# Patient Record
Sex: Female | Born: 1950
Health system: Southern US, Community
[De-identification: ages and names within clinical notes are randomized; demographics above are authoritative.]

## PROBLEM LIST (undated history)

## (undated) DIAGNOSIS — E785 Hyperlipidemia, unspecified: Secondary | ICD-10-CM

## (undated) DIAGNOSIS — J45909 Unspecified asthma, uncomplicated: Secondary | ICD-10-CM

## (undated) DIAGNOSIS — Z8371 Family history of colonic polyps: Secondary | ICD-10-CM

## (undated) DIAGNOSIS — E782 Mixed hyperlipidemia: Secondary | ICD-10-CM

## (undated) DIAGNOSIS — J441 Chronic obstructive pulmonary disease with (acute) exacerbation: Secondary | ICD-10-CM

## (undated) DIAGNOSIS — D649 Anemia, unspecified: Secondary | ICD-10-CM

## (undated) DIAGNOSIS — J449 Chronic obstructive pulmonary disease, unspecified: Secondary | ICD-10-CM

## (undated) DIAGNOSIS — M1990 Unspecified osteoarthritis, unspecified site: Secondary | ICD-10-CM

## (undated) DIAGNOSIS — E119 Type 2 diabetes mellitus without complications: Secondary | ICD-10-CM

## (undated) DIAGNOSIS — K219 Gastro-esophageal reflux disease without esophagitis: Secondary | ICD-10-CM

## (undated) DIAGNOSIS — Z860100 Personal history of colon polyps, unspecified: Secondary | ICD-10-CM

## (undated) DIAGNOSIS — I2510 Atherosclerotic heart disease of native coronary artery without angina pectoris: Secondary | ICD-10-CM

## (undated) DIAGNOSIS — N951 Menopausal and female climacteric states: Secondary | ICD-10-CM

## (undated) DIAGNOSIS — I119 Hypertensive heart disease without heart failure: Secondary | ICD-10-CM

## (undated) DIAGNOSIS — F419 Anxiety disorder, unspecified: Secondary | ICD-10-CM

## (undated) DIAGNOSIS — M797 Fibromyalgia: Secondary | ICD-10-CM

## (undated) DIAGNOSIS — C4490 Unspecified malignant neoplasm of skin, unspecified: Secondary | ICD-10-CM

## (undated) DIAGNOSIS — M1991 Primary osteoarthritis, unspecified site: Secondary | ICD-10-CM

## (undated) DIAGNOSIS — F32A Depression, unspecified: Secondary | ICD-10-CM

## (undated) DIAGNOSIS — E039 Hypothyroidism, unspecified: Secondary | ICD-10-CM

## (undated) DIAGNOSIS — F320 Major depressive disorder, single episode, mild: Secondary | ICD-10-CM

## (undated) DIAGNOSIS — J45901 Unspecified asthma with (acute) exacerbation: Secondary | ICD-10-CM

## (undated) DIAGNOSIS — G4730 Sleep apnea, unspecified: Secondary | ICD-10-CM

## (undated) DIAGNOSIS — F329 Major depressive disorder, single episode, unspecified: Secondary | ICD-10-CM

## (undated) DIAGNOSIS — M069 Rheumatoid arthritis, unspecified: Secondary | ICD-10-CM

## (undated) DIAGNOSIS — I499 Cardiac arrhythmia, unspecified: Secondary | ICD-10-CM

## (undated) DIAGNOSIS — R200 Anesthesia of skin: Secondary | ICD-10-CM

## (undated) DIAGNOSIS — E1121 Type 2 diabetes mellitus with diabetic nephropathy: Secondary | ICD-10-CM

## (undated) DIAGNOSIS — J320 Chronic maxillary sinusitis: Secondary | ICD-10-CM

## (undated) DIAGNOSIS — Z83719 Family history of colon polyps, unspecified: Secondary | ICD-10-CM

## (undated) DIAGNOSIS — E663 Overweight: Secondary | ICD-10-CM

## (undated) DIAGNOSIS — E559 Vitamin D deficiency, unspecified: Secondary | ICD-10-CM

## (undated) DIAGNOSIS — R519 Headache, unspecified: Secondary | ICD-10-CM

## (undated) DIAGNOSIS — R202 Paresthesia of skin: Secondary | ICD-10-CM

## (undated) DIAGNOSIS — F17219 Nicotine dependence, cigarettes, with unspecified nicotine-induced disorders: Secondary | ICD-10-CM

## (undated) HISTORY — DX: Rheumatoid arthritis, unspecified: M06.9

## (undated) HISTORY — DX: Sleep apnea, unspecified: G47.30

## (undated) HISTORY — DX: Type 2 diabetes mellitus without complications: E11.9

## (undated) HISTORY — DX: Fibromyalgia: M79.7

## (undated) HISTORY — DX: Anesthesia of skin: R20.0

## (undated) HISTORY — DX: Unspecified osteoarthritis, unspecified site: M19.90

## (undated) HISTORY — DX: Personal history of colonic polyps: Z86.010

## (undated) HISTORY — DX: Overweight: E66.3

## (undated) HISTORY — DX: Vitamin D deficiency, unspecified: E55.9

## (undated) HISTORY — DX: Unspecified malignant neoplasm of skin, unspecified: C44.90

## (undated) HISTORY — PX: TONSILLECTOMY: SHX5217

## (undated) HISTORY — DX: Anxiety disorder, unspecified: F41.9

## (undated) HISTORY — DX: Hypothyroidism, unspecified: E03.9

## (undated) HISTORY — DX: Chronic maxillary sinusitis: J32.0

## (undated) HISTORY — DX: Primary osteoarthritis, unspecified site: M19.91

## (undated) HISTORY — DX: Headache, unspecified: R51.9

## (undated) HISTORY — DX: Hypertensive heart disease without heart failure: I11.9

## (undated) HISTORY — DX: Anesthesia of skin: R20.2

## (undated) HISTORY — DX: Unspecified asthma, uncomplicated: J45.909

## (undated) HISTORY — DX: Family history of colonic polyps: Z83.71

## (undated) HISTORY — DX: Chronic obstructive pulmonary disease, unspecified: J44.9

## (undated) HISTORY — DX: Family history of colon polyps, unspecified: Z83.719

## (undated) HISTORY — PX: HEEL SPUR SURGERY: SHX665

## (undated) HISTORY — DX: Unspecified asthma with (acute) exacerbation: J45.901

## (undated) HISTORY — DX: Atherosclerotic heart disease of native coronary artery without angina pectoris: I25.10

## (undated) HISTORY — DX: Menopausal and female climacteric states: N95.1

## (undated) HISTORY — DX: Gastro-esophageal reflux disease without esophagitis: K21.9

## (undated) HISTORY — DX: Depression, unspecified: F32.A

## (undated) HISTORY — DX: Type 2 diabetes mellitus with diabetic nephropathy: E11.21

## (undated) HISTORY — DX: Nicotine dependence, cigarettes, with unspecified nicotine-induced disorders: F17.219

## (undated) HISTORY — DX: Mixed hyperlipidemia: E78.2

## (undated) HISTORY — DX: Major depressive disorder, single episode, unspecified: F32.9

## (undated) HISTORY — DX: Major depressive disorder, single episode, mild: F32.0

## (undated) HISTORY — DX: Chronic obstructive pulmonary disease with (acute) exacerbation: J44.1

## (undated) HISTORY — DX: Hyperlipidemia, unspecified: E78.5

## (undated) HISTORY — DX: Personal history of colon polyps, unspecified: Z86.0100

---

## 1974-06-05 HISTORY — PX: BREAST LUMPECTOMY: SHX2

## 1978-06-05 HISTORY — PX: CYST EXCISION: SHX5701

## 1982-06-05 HISTORY — PX: ABDOMINAL HYSTERECTOMY: SHX81

## 1996-06-05 HISTORY — PX: CARPAL TUNNEL RELEASE: SHX101

## 1998-01-05 ENCOUNTER — Ambulatory Visit (HOSPITAL_BASED_OUTPATIENT_CLINIC_OR_DEPARTMENT_OTHER): Admission: RE | Admit: 1998-01-05 | Discharge: 1998-01-05 | Payer: Self-pay | Admitting: Podiatry

## 1998-09-22 ENCOUNTER — Ambulatory Visit (HOSPITAL_COMMUNITY): Admission: RE | Admit: 1998-09-22 | Discharge: 1998-09-22 | Payer: Self-pay | Admitting: Orthopedic Surgery

## 1998-09-22 ENCOUNTER — Encounter: Payer: Self-pay | Admitting: Orthopedic Surgery

## 1998-10-19 ENCOUNTER — Encounter: Payer: Self-pay | Admitting: Orthopedic Surgery

## 1998-10-19 ENCOUNTER — Ambulatory Visit (HOSPITAL_COMMUNITY): Admission: RE | Admit: 1998-10-19 | Discharge: 1998-10-19 | Payer: Self-pay | Admitting: Orthopedic Surgery

## 2010-09-05 HISTORY — PX: CHOLECYSTECTOMY: SHX55

## 2013-02-07 HISTORY — PX: COLONOSCOPY: SHX174

## 2014-01-12 ENCOUNTER — Encounter: Payer: Self-pay | Admitting: Critical Care Medicine

## 2014-01-13 ENCOUNTER — Encounter: Payer: Self-pay | Admitting: Critical Care Medicine

## 2014-01-13 ENCOUNTER — Ambulatory Visit (INDEPENDENT_AMBULATORY_CARE_PROVIDER_SITE_OTHER): Payer: Self-pay | Admitting: Critical Care Medicine

## 2014-01-13 VITALS — BP 128/76 | HR 90 | Temp 96.6°F | Ht 65.0 in | Wt 219.0 lb

## 2014-01-13 DIAGNOSIS — J45901 Unspecified asthma with (acute) exacerbation: Secondary | ICD-10-CM | POA: Insufficient documentation

## 2014-01-13 DIAGNOSIS — F329 Major depressive disorder, single episode, unspecified: Secondary | ICD-10-CM | POA: Insufficient documentation

## 2014-01-13 DIAGNOSIS — E039 Hypothyroidism, unspecified: Secondary | ICD-10-CM | POA: Insufficient documentation

## 2014-01-13 DIAGNOSIS — G4730 Sleep apnea, unspecified: Secondary | ICD-10-CM

## 2014-01-13 DIAGNOSIS — M797 Fibromyalgia: Secondary | ICD-10-CM | POA: Insufficient documentation

## 2014-01-13 DIAGNOSIS — J441 Chronic obstructive pulmonary disease with (acute) exacerbation: Secondary | ICD-10-CM | POA: Insufficient documentation

## 2014-01-13 DIAGNOSIS — C4490 Unspecified malignant neoplasm of skin, unspecified: Secondary | ICD-10-CM | POA: Insufficient documentation

## 2014-01-13 DIAGNOSIS — J4540 Moderate persistent asthma, uncomplicated: Secondary | ICD-10-CM

## 2014-01-13 DIAGNOSIS — E785 Hyperlipidemia, unspecified: Secondary | ICD-10-CM | POA: Insufficient documentation

## 2014-01-13 DIAGNOSIS — G4733 Obstructive sleep apnea (adult) (pediatric): Secondary | ICD-10-CM

## 2014-01-13 DIAGNOSIS — M1990 Unspecified osteoarthritis, unspecified site: Secondary | ICD-10-CM | POA: Insufficient documentation

## 2014-01-13 DIAGNOSIS — J45909 Unspecified asthma, uncomplicated: Secondary | ICD-10-CM

## 2014-01-13 DIAGNOSIS — E119 Type 2 diabetes mellitus without complications: Secondary | ICD-10-CM

## 2014-01-13 DIAGNOSIS — M069 Rheumatoid arthritis, unspecified: Secondary | ICD-10-CM | POA: Insufficient documentation

## 2014-01-13 DIAGNOSIS — E782 Mixed hyperlipidemia: Secondary | ICD-10-CM | POA: Insufficient documentation

## 2014-01-13 DIAGNOSIS — E669 Obesity, unspecified: Secondary | ICD-10-CM

## 2014-01-13 DIAGNOSIS — G4710 Hypersomnia, unspecified: Secondary | ICD-10-CM

## 2014-01-13 DIAGNOSIS — F32A Depression, unspecified: Secondary | ICD-10-CM | POA: Insufficient documentation

## 2014-01-13 DIAGNOSIS — K219 Gastro-esophageal reflux disease without esophagitis: Secondary | ICD-10-CM

## 2014-01-13 DIAGNOSIS — J418 Mixed simple and mucopurulent chronic bronchitis: Secondary | ICD-10-CM

## 2014-01-13 DIAGNOSIS — R1314 Dysphagia, pharyngoesophageal phase: Secondary | ICD-10-CM

## 2014-01-13 DIAGNOSIS — M1991 Primary osteoarthritis, unspecified site: Secondary | ICD-10-CM | POA: Insufficient documentation

## 2014-01-13 HISTORY — DX: Obstructive sleep apnea (adult) (pediatric): G47.33

## 2014-01-13 MED ORDER — FLUTICASONE PROPIONATE 50 MCG/ACT NA SUSP: 2.0000 | Freq: Two times a day (BID) | NASAL | Status: DC

## 2014-01-13 MED ORDER — PREDNISONE 10 MG PO TABS: ORAL_TABLET | ORAL | Status: DC

## 2014-01-13 MED ORDER — OMEPRAZOLE 20 MG PO CPDR: 20.0000 mg | DELAYED_RELEASE_CAPSULE | Freq: Every day | ORAL | Status: DC

## 2014-01-13 MED ORDER — PREDNISONE 5 MG PO TABS: ORAL_TABLET | ORAL | Status: DC

## 2014-01-13 NOTE — Assessment & Plan Note (Signed)
Obstructive asthma with acute exacerbation Fairly mild airway obstruction on pulmonary function testing Reflux disease as a precipitating factor along with chronic sinus disease Plan Chest xray today Esophageal swallow study will be obtained Labs : allergy profile, Alpha one antitrypsin Increase prednisone to 10mg  Take 4 for two days three for two days two for two days one for two days Stay on Dulera, use new spacer Start omeprazole one daily before breakfast, 61minutes Follow reflux diet Increase flonase two puff ea nostril twice daily

## 2014-01-13 NOTE — Assessment & Plan Note (Signed)
previous study in 2012 showing moderate sleep-disordered breathing with an AHI of 3.8 no significant restless legs seen followup sleep titration study showed all apneas were treated with moderate pressure of 11 cm water pressure Plan Repeat sleep study

## 2014-01-13 NOTE — Patient Instructions (Addendum)
Chest xray today Esophageal swallow study will be obtained Labs : allergy profile, Alpha one antitrypsin Increase prednisone to 10mg  Take 4 for two days three for two days two for two days one for two days Stay on Dulera, use new spacer Start omeprazole one daily before breakfast, 81minutes Follow reflux diet Increase flonase two puff ea nostril twice daily A home sleep study will be obtained Return 1 month

## 2014-01-13 NOTE — Progress Notes (Signed)
Subjective:    Patient ID: Brenda Cochran, female    DOB: Oct 07, 1950, 63 y.o.   MRN: [id number]  HPI Comments: Hx of tired all the time and dyspneic.  Dx asthma rx dulera. [place] stay in 2011 at [place]. Former smoker for years.  Dx copd and asthma. Rx oxygen and BDs  Now worse. Pt saw [facility] one time 2012.  Dx OSA, sleep study: never Rx with cpap.  Pt stays tired all the time. Recent sinus issue and stopped up for two years  Shortness of Breath This is a chronic problem. The current episode started more than 1 year ago. The problem occurs constantly (exertional and all the time ). The problem has been gradually worsening. Associated symptoms include chest pain, ear pain, headaches, orthopnea, PND, rhinorrhea, a sore throat and wheezing. Pertinent negatives include no abdominal pain, claudication, coryza, fever, hemoptysis, leg pain, leg swelling, sputum production, swollen glands, syncope or vomiting. 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 Associated symptoms comments: Notes heartburn Cough is dry. Risk factors include smoking. She has tried beta agonist inhalers and steroid inhalers for the symptoms. The treatment provided no relief. Her past medical history is significant for asthma, COPD, DVT, a heart failure and pneumonia. There is no history of allergies, CAD, chronic lung disease, PE or a recent surgery. (Hx of diastolic HF; off warfarin for a while, hx of DVT)    PUL ASTHMA HISTORY 01/13/2014  Symptoms Throughout the day  Nighttime awakenings Often--7/wk  Interference with activity Extreme limitations  SABA use Several times/day  Exacerbations requiring oral steroids 2 or more / year   Past Medical History  Diagnosis Date  . Asthma   . COPD (chronic obstructive pulmonary disease)   . Osteoarthritis   . Hyperlipidemia   . Fibromyalgia    . RA (rheumatoid arthritis)     [name]  . Type 2 diabetes mellitus   . Skin cancer     face  . Hypothyroidism (acquired)   . GERD (gastroesophageal reflux disease)   . Depression      Family History  Problem Relation Age of Onset  . Breast cancer Sister   . Lymphoma Sister   . Multiple sclerosis Brother   . Muscular dystrophy Brother   . CAD    . Diabetes    . Hypertension    . Seizures    . Lung disease Mother     never smoker  . Lung disease Maternal Uncle   . Lung disease Maternal Uncle   . Lung cancer Father   . Bone cancer Father      History   Social History  . Marital Status: Married    Spouse Name: N/A    Number of Children: N/A  . Years of Education: N/A   Occupational History  . disabled     jockey   Social History Main Topics  . Smoking status: Former Smoker -- 3.00 packs/day for 30 years    Types: Cigarettes    Quit date: 06/04/2010  . Smokeless tobacco: Not on file  . Alcohol Use: No  . Drug Use: No  . Sexual Activity: Not on file   Other Topics Concern  . Not on file   Social History Narrative   Lives with Toshiye Kever husband.   Disabled     Allergies  Allergen Reactions  . Contrast Media [Iodinated  Diagnostic Agents]   . Cymbalta [Duloxetine Hcl]     Nausea, vomiting  . Metformin And Related   . Oxycontin [Oxycodone Hcl]     Heart races  . Shellfish Allergy   . Ultram [Tramadol]      No outpatient prescriptions prior to visit.   No facility-administered medications prior to visit.      Review of Systems  Constitutional: Positive for diaphoresis, fatigue and unexpected weight change. Negative for fever.  HENT: Positive for ear discharge, ear pain, postnasal drip, rhinorrhea, sore throat, trouble swallowing and voice change.   Respiratory: Positive for apnea, cough, choking, chest tightness, shortness of breath and wheezing. Negative for hemoptysis and sputum production.   Cardiovascular: Positive for chest pain,  orthopnea and PND. Negative for claudication, leg swelling and syncope.  Gastrointestinal: Negative for vomiting and abdominal pain.       Heartburn daily  Neurological: Positive for weakness and headaches. Negative for syncope.       Objective:   Physical Exam  Filed Vitals:   01/13/14 1402  BP: 128/76  Pulse: 90  Temp: 96.6 F (35.9 C)  TempSrc: Oral  Height: 5\' 5"  (1.651 m)  Weight: 219 lb (99.338 kg)  SpO2: 97%    Gen: Pleasant, obese in no distress,  depressed affect  ENT: No lesions,  mouth clear,  oropharynx clear, no postnasal drip  Neck: No JVD, no TMG, no carotid bruits  Lungs: No use of accessory muscles, no dullness to percussion, expired wheezes and distant breath sounds  Cardiovascular: RRR, heart sounds normal, no murmur or gallops, no peripheral edema  Abdomen: soft and NT, no HSM,  BS normal  Musculoskeletal: No deformities, no cyanosis or clubbing  Neuro: alert, non focal  Skin: Warm, no lesions or rashes  No results found. Spirometry shows mild obstructive defect with FEV1 76% predicted      Assessment & Plan:   OSA (obstructive sleep apnea) previous study in 2012 showing moderate sleep-disordered breathing with an AHI of 3.8 no significant restless legs seen followup sleep titration study showed all apneas were treated with moderate pressure of 11 cm water pressure Plan Repeat sleep study   Asthma, chronic obstructive, with acute exacerbation Obstructive asthma with acute exacerbation Fairly mild airway obstruction on pulmonary function testing Reflux disease as a precipitating factor along with chronic sinus disease Plan Chest xray today Esophageal swallow study will be obtained Labs : allergy profile, Alpha one antitrypsin Increase prednisone to 10mg  Take 4 for two days three for two days two for two days one for two days Stay on Dulera, use new spacer Start omeprazole one daily before breakfast, 50minutes Follow reflux  diet Increase flonase two puff ea nostril twice daily   Obesity (BMI 30-39.9) Moderate obesity with reflux disease Plan Proton pump inhibitor Weight loss with reflux diet   Updated Medication List Outpatient Encounter Prescriptions as of 01/13/2014  Medication Sig  . atorvastatin (LIPITOR) 40 MG tablet Take 40 mg by mouth daily.  . Canagliflozin (INVOKANA) 100 MG TABS Take 100 mg by mouth. Take 1 tablet by mouth daily  . cholecalciferol (VITAMIN D) 1000 UNITS tablet Take 1,000 Units by mouth. Take 2 times a  week  . fluticasone (FLONASE) 50 MCG/ACT nasal spray Place 2 sprays into both nostrils 2 (two) times daily.  . furosemide (LASIX) 20 MG tablet Take 20 mg by mouth. Daily as needed 1 tablet  . HYDROcodone-acetaminophen (NORCO/VICODIN) 5-325 MG per tablet Take 1 tablet by mouth every 6 (six)  hours as needed for moderate pain.  . Liraglutide (VICTOZA ) Inject into the skin. 0.6 mg SQ every day  . methotrexate (RHEUMATREX) 2.5 MG tablet Take 2.5 mg by mouth. 2.5 mg tablets by mouth take 8 one time a week  . mometasone-formoterol (DULERA) 200-5 MCG/ACT AERO Inhale 2 puffs into the lungs 2 (two) times daily.  . predniSONE (DELTASONE) 5 MG tablet Hold while on 10mg  prednisone  . sertraline (ZOLOFT) 100 MG tablet Take 100 mg by mouth. 2 tablets 1 time a day  . thyroid (ARMOUR) 60 MG tablet Take 60 mg by mouth daily before breakfast.  . [DISCONTINUED] fluticasone (FLONASE) 50 MCG/ACT nasal spray Place 2 sprays into both nostrils daily.  . [DISCONTINUED] mometasone-formoterol (DULERA) 100-5 MCG/ACT AERO Inhale 2 puffs into the lungs 2 (two) times daily.  . [DISCONTINUED] predniSONE (DELTASONE) 5 MG tablet Take 5 mg by mouth. As directed as needed  . omeprazole (PRILOSEC) 20 MG capsule Take 1 capsule (20 mg total) by mouth daily.  . predniSONE (DELTASONE) 10 MG tablet Take 4 for two days three for two days two for two days one for two days

## 2014-01-13 NOTE — Assessment & Plan Note (Signed)
Moderate obesity with reflux disease Plan Proton pump inhibitor Weight loss with reflux diet

## 2014-01-14 ENCOUNTER — Telehealth: Payer: Self-pay | Admitting: Critical Care Medicine

## 2014-01-14 NOTE — Telephone Encounter (Signed)
Called and spoke to pt. Informed pt that the results have not yet been reviewed but once we have the results we will inform her. Pt stated she had sharp pains in her chest and "rib cage area" last night. I informed the pt to seek emergency care if she begins to experience severe chest pain. Pt verbalized understanding and denied any further questions or concerns at this time.

## 2014-01-30 ENCOUNTER — Telehealth: Payer: Self-pay | Admitting: Critical Care Medicine

## 2014-01-30 DIAGNOSIS — K219 Gastro-esophageal reflux disease without esophagitis: Secondary | ICD-10-CM

## 2014-01-30 DIAGNOSIS — J45901 Unspecified asthma with (acute) exacerbation: Secondary | ICD-10-CM

## 2014-01-30 DIAGNOSIS — J441 Chronic obstructive pulmonary disease with (acute) exacerbation: Secondary | ICD-10-CM

## 2014-01-30 NOTE — Telephone Encounter (Signed)
Returning call.Brenda Cochran ° °

## 2014-01-30 NOTE — Telephone Encounter (Signed)
lmomtcb for pt 

## 2014-01-30 NOTE — Telephone Encounter (Signed)
Tell pt i reviewed cxr: normal, also esophagram normal No alpha one antitrypsin deficiency

## 2014-01-30 NOTE — Telephone Encounter (Signed)
Called, spoke with pt - Informed her of below results per Dr. Joya Gaskins.  She verbalized understanding and voiced no further questions or concerns at this time.

## 2014-01-30 NOTE — Telephone Encounter (Signed)
Pt states she is returning call & states it ok to LM if no answer//hdp

## 2014-02-10 ENCOUNTER — Telehealth: Payer: Self-pay | Admitting: Critical Care Medicine

## 2014-02-10 ENCOUNTER — Ambulatory Visit: Payer: Self-pay | Admitting: Critical Care Medicine

## 2014-02-10 ENCOUNTER — Encounter: Payer: Self-pay | Admitting: Critical Care Medicine

## 2014-02-10 NOTE — Telephone Encounter (Signed)
I do not see anything in EPIC. Called spoke with pt. She reports pt spouse answered the phone. Pt reports she cancelled today's appt last week when she was called. Crystal, did you call pt? thanks

## 2014-02-10 NOTE — Telephone Encounter (Signed)
Spoke with pt -  Pt needed to cancelled today's appt in the [place] office.  Offered to rescheduled appt.  Pt would like to call back to do this.  She voiced no further questions or concerns at this time.

## 2014-03-03 ENCOUNTER — Telehealth: Payer: Self-pay | Admitting: Critical Care Medicine

## 2014-03-03 DIAGNOSIS — G4733 Obstructive sleep apnea (adult) (pediatric): Secondary | ICD-10-CM

## 2014-03-03 NOTE — Telephone Encounter (Signed)
Called, spoke with pt - Informed her of below results and recs per Dr. Joya Gaskins.  She verbalized understanding and voiced no further questions or concerns at this time.

## 2014-03-03 NOTE — Telephone Encounter (Signed)
Call pt and tell her sleep study only showed very mild sleep apnea. No need for oxygen or cpap.  She needs to just focus on weight loss. Consider trying to sleep on side.

## 2014-06-08 DIAGNOSIS — R81 Glycosuria: Secondary | ICD-10-CM | POA: Diagnosis not present

## 2014-06-08 DIAGNOSIS — J9801 Acute bronchospasm: Secondary | ICD-10-CM | POA: Diagnosis not present

## 2014-06-08 DIAGNOSIS — J209 Acute bronchitis, unspecified: Secondary | ICD-10-CM | POA: Diagnosis not present

## 2014-06-12 DIAGNOSIS — J209 Acute bronchitis, unspecified: Secondary | ICD-10-CM | POA: Diagnosis not present

## 2014-07-30 DIAGNOSIS — M5412 Radiculopathy, cervical region: Secondary | ICD-10-CM | POA: Diagnosis not present

## 2014-07-30 DIAGNOSIS — M5032 Other cervical disc degeneration, mid-cervical region: Secondary | ICD-10-CM | POA: Diagnosis not present

## 2014-07-30 DIAGNOSIS — R0602 Shortness of breath: Secondary | ICD-10-CM | POA: Diagnosis not present

## 2014-07-30 DIAGNOSIS — I509 Heart failure, unspecified: Secondary | ICD-10-CM | POA: Diagnosis not present

## 2014-07-30 DIAGNOSIS — R0789 Other chest pain: Secondary | ICD-10-CM | POA: Diagnosis not present

## 2014-07-30 DIAGNOSIS — Z1231 Encounter for screening mammogram for malignant neoplasm of breast: Secondary | ICD-10-CM | POA: Diagnosis not present

## 2014-07-30 DIAGNOSIS — Z8673 Personal history of transient ischemic attack (TIA), and cerebral infarction without residual deficits: Secondary | ICD-10-CM | POA: Diagnosis not present

## 2014-07-30 DIAGNOSIS — M47812 Spondylosis without myelopathy or radiculopathy, cervical region: Secondary | ICD-10-CM | POA: Diagnosis not present

## 2014-07-30 LAB — HM MAMMOGRAPHY: HM Mammogram: NORMAL (ref 0–4)

## 2014-08-07 DIAGNOSIS — J4520 Mild intermittent asthma, uncomplicated: Secondary | ICD-10-CM | POA: Diagnosis not present

## 2014-08-07 DIAGNOSIS — R5383 Other fatigue: Secondary | ICD-10-CM | POA: Diagnosis not present

## 2014-08-07 DIAGNOSIS — J301 Allergic rhinitis due to pollen: Secondary | ICD-10-CM | POA: Diagnosis not present

## 2014-08-07 DIAGNOSIS — G4733 Obstructive sleep apnea (adult) (pediatric): Secondary | ICD-10-CM | POA: Diagnosis not present

## 2014-08-21 DIAGNOSIS — G4733 Obstructive sleep apnea (adult) (pediatric): Secondary | ICD-10-CM | POA: Diagnosis not present

## 2014-09-07 DIAGNOSIS — J301 Allergic rhinitis due to pollen: Secondary | ICD-10-CM | POA: Diagnosis not present

## 2014-09-07 DIAGNOSIS — G4733 Obstructive sleep apnea (adult) (pediatric): Secondary | ICD-10-CM | POA: Diagnosis not present

## 2014-09-07 DIAGNOSIS — J4520 Mild intermittent asthma, uncomplicated: Secondary | ICD-10-CM | POA: Diagnosis not present

## 2014-09-07 DIAGNOSIS — R5383 Other fatigue: Secondary | ICD-10-CM | POA: Diagnosis not present

## 2014-09-09 DIAGNOSIS — E780 Pure hypercholesterolemia: Secondary | ICD-10-CM | POA: Diagnosis not present

## 2014-09-09 DIAGNOSIS — E018 Other iodine-deficiency related thyroid disorders and allied conditions: Secondary | ICD-10-CM | POA: Diagnosis not present

## 2014-09-09 DIAGNOSIS — M069 Rheumatoid arthritis, unspecified: Secondary | ICD-10-CM | POA: Diagnosis not present

## 2014-09-09 DIAGNOSIS — E038 Other specified hypothyroidism: Secondary | ICD-10-CM | POA: Diagnosis not present

## 2014-09-09 DIAGNOSIS — E1165 Type 2 diabetes mellitus with hyperglycemia: Secondary | ICD-10-CM | POA: Diagnosis not present

## 2014-09-09 DIAGNOSIS — F341 Dysthymic disorder: Secondary | ICD-10-CM | POA: Diagnosis not present

## 2014-09-09 DIAGNOSIS — J45998 Other asthma: Secondary | ICD-10-CM | POA: Diagnosis not present

## 2014-09-09 DIAGNOSIS — E559 Vitamin D deficiency, unspecified: Secondary | ICD-10-CM | POA: Diagnosis not present

## 2014-09-11 DIAGNOSIS — G4733 Obstructive sleep apnea (adult) (pediatric): Secondary | ICD-10-CM | POA: Diagnosis not present

## 2014-09-25 DIAGNOSIS — J4520 Mild intermittent asthma, uncomplicated: Secondary | ICD-10-CM | POA: Diagnosis not present

## 2014-09-25 DIAGNOSIS — F411 Generalized anxiety disorder: Secondary | ICD-10-CM | POA: Diagnosis not present

## 2014-09-25 DIAGNOSIS — J301 Allergic rhinitis due to pollen: Secondary | ICD-10-CM | POA: Diagnosis not present

## 2014-09-25 DIAGNOSIS — R5383 Other fatigue: Secondary | ICD-10-CM | POA: Diagnosis not present

## 2014-09-29 ENCOUNTER — Telehealth: Payer: Self-pay | Admitting: Critical Care Medicine

## 2014-09-29 NOTE — Telephone Encounter (Signed)
Left message for Brenda Cochran that request for sleep study has been faxed to number provided. If she does not receive the fax to please contact our office asap. Nothing more needed at this time.

## 2014-10-02 DIAGNOSIS — G4733 Obstructive sleep apnea (adult) (pediatric): Secondary | ICD-10-CM | POA: Diagnosis not present

## 2014-10-22 DIAGNOSIS — H524 Presbyopia: Secondary | ICD-10-CM | POA: Diagnosis not present

## 2014-10-22 DIAGNOSIS — I10 Essential (primary) hypertension: Secondary | ICD-10-CM | POA: Diagnosis not present

## 2014-10-22 DIAGNOSIS — H5203 Hypermetropia, bilateral: Secondary | ICD-10-CM | POA: Diagnosis not present

## 2014-10-22 DIAGNOSIS — E119 Type 2 diabetes mellitus without complications: Secondary | ICD-10-CM | POA: Diagnosis not present

## 2014-10-22 DIAGNOSIS — H52223 Regular astigmatism, bilateral: Secondary | ICD-10-CM | POA: Diagnosis not present

## 2014-11-02 DIAGNOSIS — M545 Low back pain: Secondary | ICD-10-CM | POA: Diagnosis not present

## 2014-11-09 DIAGNOSIS — M545 Low back pain: Secondary | ICD-10-CM | POA: Diagnosis not present

## 2014-11-09 DIAGNOSIS — M5430 Sciatica, unspecified side: Secondary | ICD-10-CM | POA: Diagnosis not present

## 2014-11-09 DIAGNOSIS — L738 Other specified follicular disorders: Secondary | ICD-10-CM | POA: Diagnosis not present

## 2014-11-09 DIAGNOSIS — E1165 Type 2 diabetes mellitus with hyperglycemia: Secondary | ICD-10-CM | POA: Diagnosis not present

## 2014-11-19 DIAGNOSIS — M545 Low back pain: Secondary | ICD-10-CM | POA: Diagnosis not present

## 2014-11-19 DIAGNOSIS — R262 Difficulty in walking, not elsewhere classified: Secondary | ICD-10-CM | POA: Diagnosis not present

## 2014-11-19 DIAGNOSIS — M5432 Sciatica, left side: Secondary | ICD-10-CM | POA: Diagnosis not present

## 2014-11-19 DIAGNOSIS — R202 Paresthesia of skin: Secondary | ICD-10-CM | POA: Diagnosis not present

## 2014-11-25 DIAGNOSIS — R5383 Other fatigue: Secondary | ICD-10-CM | POA: Diagnosis not present

## 2014-11-25 DIAGNOSIS — G4733 Obstructive sleep apnea (adult) (pediatric): Secondary | ICD-10-CM | POA: Diagnosis not present

## 2014-11-25 DIAGNOSIS — N3941 Urge incontinence: Secondary | ICD-10-CM | POA: Diagnosis not present

## 2014-11-25 DIAGNOSIS — J4520 Mild intermittent asthma, uncomplicated: Secondary | ICD-10-CM | POA: Diagnosis not present

## 2014-11-25 DIAGNOSIS — J301 Allergic rhinitis due to pollen: Secondary | ICD-10-CM | POA: Diagnosis not present

## 2014-11-25 DIAGNOSIS — F321 Major depressive disorder, single episode, moderate: Secondary | ICD-10-CM | POA: Diagnosis not present

## 2014-11-25 DIAGNOSIS — Z1211 Encounter for screening for malignant neoplasm of colon: Secondary | ICD-10-CM | POA: Diagnosis not present

## 2014-11-25 DIAGNOSIS — Z0000 Encounter for general adult medical examination without abnormal findings: Secondary | ICD-10-CM | POA: Diagnosis not present

## 2014-11-26 DIAGNOSIS — M545 Low back pain: Secondary | ICD-10-CM | POA: Diagnosis not present

## 2014-11-26 DIAGNOSIS — M5432 Sciatica, left side: Secondary | ICD-10-CM | POA: Diagnosis not present

## 2014-11-26 DIAGNOSIS — R202 Paresthesia of skin: Secondary | ICD-10-CM | POA: Diagnosis not present

## 2014-11-26 DIAGNOSIS — R262 Difficulty in walking, not elsewhere classified: Secondary | ICD-10-CM | POA: Diagnosis not present

## 2014-12-30 DIAGNOSIS — M0600 Rheumatoid arthritis without rheumatoid factor, unspecified site: Secondary | ICD-10-CM | POA: Diagnosis not present

## 2014-12-30 DIAGNOSIS — M791 Myalgia: Secondary | ICD-10-CM | POA: Diagnosis not present

## 2014-12-31 DIAGNOSIS — M2550 Pain in unspecified joint: Secondary | ICD-10-CM | POA: Diagnosis not present

## 2014-12-31 DIAGNOSIS — M069 Rheumatoid arthritis, unspecified: Secondary | ICD-10-CM | POA: Diagnosis not present

## 2014-12-31 DIAGNOSIS — R351 Nocturia: Secondary | ICD-10-CM | POA: Diagnosis not present

## 2014-12-31 DIAGNOSIS — L918 Other hypertrophic disorders of the skin: Secondary | ICD-10-CM | POA: Diagnosis not present

## 2014-12-31 DIAGNOSIS — M791 Myalgia: Secondary | ICD-10-CM | POA: Diagnosis not present

## 2014-12-31 DIAGNOSIS — E1165 Type 2 diabetes mellitus with hyperglycemia: Secondary | ICD-10-CM | POA: Diagnosis not present

## 2014-12-31 DIAGNOSIS — M150 Primary generalized (osteo)arthritis: Secondary | ICD-10-CM | POA: Diagnosis not present

## 2014-12-31 DIAGNOSIS — E780 Pure hypercholesterolemia: Secondary | ICD-10-CM | POA: Diagnosis not present

## 2014-12-31 DIAGNOSIS — G4733 Obstructive sleep apnea (adult) (pediatric): Secondary | ICD-10-CM | POA: Diagnosis not present

## 2014-12-31 DIAGNOSIS — M797 Fibromyalgia: Secondary | ICD-10-CM | POA: Diagnosis not present

## 2015-01-02 DIAGNOSIS — Z7952 Long term (current) use of systemic steroids: Secondary | ICD-10-CM | POA: Diagnosis not present

## 2015-01-02 DIAGNOSIS — Z87891 Personal history of nicotine dependence: Secondary | ICD-10-CM | POA: Diagnosis not present

## 2015-01-02 DIAGNOSIS — R531 Weakness: Secondary | ICD-10-CM | POA: Diagnosis not present

## 2015-01-02 DIAGNOSIS — R112 Nausea with vomiting, unspecified: Secondary | ICD-10-CM | POA: Diagnosis not present

## 2015-01-02 DIAGNOSIS — R51 Headache: Secondary | ICD-10-CM | POA: Diagnosis not present

## 2015-01-02 DIAGNOSIS — J449 Chronic obstructive pulmonary disease, unspecified: Secondary | ICD-10-CM | POA: Diagnosis not present

## 2015-01-02 DIAGNOSIS — R1110 Vomiting, unspecified: Secondary | ICD-10-CM | POA: Diagnosis not present

## 2015-01-02 DIAGNOSIS — E1165 Type 2 diabetes mellitus with hyperglycemia: Secondary | ICD-10-CM | POA: Diagnosis not present

## 2015-01-02 DIAGNOSIS — J45909 Unspecified asthma, uncomplicated: Secondary | ICD-10-CM | POA: Diagnosis not present

## 2015-01-02 DIAGNOSIS — Z79899 Other long term (current) drug therapy: Secondary | ICD-10-CM | POA: Diagnosis not present

## 2015-01-02 DIAGNOSIS — R42 Dizziness and giddiness: Secondary | ICD-10-CM | POA: Diagnosis not present

## 2015-01-11 DIAGNOSIS — L918 Other hypertrophic disorders of the skin: Secondary | ICD-10-CM | POA: Diagnosis not present

## 2015-04-09 DIAGNOSIS — Z23 Encounter for immunization: Secondary | ICD-10-CM | POA: Diagnosis not present

## 2015-06-08 DIAGNOSIS — J018 Other acute sinusitis: Secondary | ICD-10-CM | POA: Diagnosis not present

## 2015-06-08 DIAGNOSIS — E1165 Type 2 diabetes mellitus with hyperglycemia: Secondary | ICD-10-CM | POA: Diagnosis not present

## 2015-10-08 DIAGNOSIS — M7989 Other specified soft tissue disorders: Secondary | ICD-10-CM | POA: Diagnosis not present

## 2015-10-08 DIAGNOSIS — S93601A Unspecified sprain of right foot, initial encounter: Secondary | ICD-10-CM | POA: Diagnosis not present

## 2015-10-08 DIAGNOSIS — S93609A Unspecified sprain of unspecified foot, initial encounter: Secondary | ICD-10-CM | POA: Diagnosis not present

## 2015-10-08 DIAGNOSIS — M7731 Calcaneal spur, right foot: Secondary | ICD-10-CM | POA: Diagnosis not present

## 2015-11-26 DIAGNOSIS — E782 Mixed hyperlipidemia: Secondary | ICD-10-CM | POA: Diagnosis not present

## 2015-11-26 DIAGNOSIS — G5762 Lesion of plantar nerve, left lower limb: Secondary | ICD-10-CM | POA: Diagnosis not present

## 2015-11-26 DIAGNOSIS — G5761 Lesion of plantar nerve, right lower limb: Secondary | ICD-10-CM | POA: Diagnosis not present

## 2015-11-26 DIAGNOSIS — M25562 Pain in left knee: Secondary | ICD-10-CM | POA: Diagnosis not present

## 2015-11-26 DIAGNOSIS — M25561 Pain in right knee: Secondary | ICD-10-CM | POA: Diagnosis not present

## 2015-11-26 DIAGNOSIS — E119 Type 2 diabetes mellitus without complications: Secondary | ICD-10-CM | POA: Diagnosis not present

## 2015-11-26 DIAGNOSIS — G4733 Obstructive sleep apnea (adult) (pediatric): Secondary | ICD-10-CM | POA: Diagnosis not present

## 2015-11-26 DIAGNOSIS — E038 Other specified hypothyroidism: Secondary | ICD-10-CM | POA: Diagnosis not present

## 2015-11-26 DIAGNOSIS — E1121 Type 2 diabetes mellitus with diabetic nephropathy: Secondary | ICD-10-CM | POA: Diagnosis not present

## 2015-11-26 DIAGNOSIS — M069 Rheumatoid arthritis, unspecified: Secondary | ICD-10-CM | POA: Diagnosis not present

## 2015-12-10 DIAGNOSIS — Z7982 Long term (current) use of aspirin: Secondary | ICD-10-CM | POA: Diagnosis not present

## 2015-12-10 DIAGNOSIS — E1165 Type 2 diabetes mellitus with hyperglycemia: Secondary | ICD-10-CM | POA: Diagnosis not present

## 2015-12-10 DIAGNOSIS — I5032 Chronic diastolic (congestive) heart failure: Secondary | ICD-10-CM | POA: Diagnosis not present

## 2015-12-10 DIAGNOSIS — I249 Acute ischemic heart disease, unspecified: Secondary | ICD-10-CM | POA: Diagnosis not present

## 2015-12-10 DIAGNOSIS — I110 Hypertensive heart disease with heart failure: Secondary | ICD-10-CM | POA: Diagnosis not present

## 2015-12-10 DIAGNOSIS — E785 Hyperlipidemia, unspecified: Secondary | ICD-10-CM | POA: Diagnosis not present

## 2015-12-10 DIAGNOSIS — Z8673 Personal history of transient ischemic attack (TIA), and cerebral infarction without residual deficits: Secondary | ICD-10-CM | POA: Diagnosis not present

## 2015-12-10 DIAGNOSIS — Z79899 Other long term (current) drug therapy: Secondary | ICD-10-CM | POA: Diagnosis not present

## 2015-12-10 DIAGNOSIS — I209 Angina pectoris, unspecified: Secondary | ICD-10-CM | POA: Diagnosis not present

## 2015-12-10 DIAGNOSIS — R079 Chest pain, unspecified: Secondary | ICD-10-CM | POA: Diagnosis not present

## 2015-12-11 DIAGNOSIS — R079 Chest pain, unspecified: Secondary | ICD-10-CM | POA: Diagnosis not present

## 2015-12-27 DIAGNOSIS — J4520 Mild intermittent asthma, uncomplicated: Secondary | ICD-10-CM | POA: Diagnosis not present

## 2015-12-27 DIAGNOSIS — M25561 Pain in right knee: Secondary | ICD-10-CM | POA: Diagnosis not present

## 2015-12-27 DIAGNOSIS — M25521 Pain in right elbow: Secondary | ICD-10-CM | POA: Diagnosis not present

## 2015-12-27 DIAGNOSIS — E1165 Type 2 diabetes mellitus with hyperglycemia: Secondary | ICD-10-CM | POA: Diagnosis not present

## 2015-12-27 DIAGNOSIS — I208 Other forms of angina pectoris: Secondary | ICD-10-CM | POA: Diagnosis not present

## 2015-12-27 DIAGNOSIS — F331 Major depressive disorder, recurrent, moderate: Secondary | ICD-10-CM | POA: Diagnosis not present

## 2015-12-27 DIAGNOSIS — M0589 Other rheumatoid arthritis with rheumatoid factor of multiple sites: Secondary | ICD-10-CM | POA: Diagnosis not present

## 2016-01-13 DIAGNOSIS — M7989 Other specified soft tissue disorders: Secondary | ICD-10-CM | POA: Diagnosis not present

## 2016-01-13 DIAGNOSIS — M25561 Pain in right knee: Secondary | ICD-10-CM | POA: Diagnosis not present

## 2016-01-26 DIAGNOSIS — M25461 Effusion, right knee: Secondary | ICD-10-CM | POA: Diagnosis not present

## 2016-01-26 DIAGNOSIS — M25561 Pain in right knee: Secondary | ICD-10-CM | POA: Diagnosis not present

## 2016-01-26 DIAGNOSIS — M791 Myalgia: Secondary | ICD-10-CM | POA: Diagnosis not present

## 2016-02-23 DIAGNOSIS — E6609 Other obesity due to excess calories: Secondary | ICD-10-CM | POA: Diagnosis not present

## 2016-02-23 DIAGNOSIS — M8589 Other specified disorders of bone density and structure, multiple sites: Secondary | ICD-10-CM | POA: Diagnosis not present

## 2016-02-23 DIAGNOSIS — Z0001 Encounter for general adult medical examination with abnormal findings: Secondary | ICD-10-CM | POA: Diagnosis not present

## 2016-02-23 DIAGNOSIS — Z6833 Body mass index (BMI) 33.0-33.9, adult: Secondary | ICD-10-CM | POA: Diagnosis not present

## 2016-02-23 DIAGNOSIS — B3789 Other sites of candidiasis: Secondary | ICD-10-CM | POA: Diagnosis not present

## 2016-02-23 DIAGNOSIS — Z23 Encounter for immunization: Secondary | ICD-10-CM | POA: Diagnosis not present

## 2016-02-23 DIAGNOSIS — N644 Mastodynia: Secondary | ICD-10-CM | POA: Diagnosis not present

## 2016-02-23 DIAGNOSIS — Z1239 Encounter for other screening for malignant neoplasm of breast: Secondary | ICD-10-CM | POA: Diagnosis not present

## 2016-02-23 DIAGNOSIS — Z7952 Long term (current) use of systemic steroids: Secondary | ICD-10-CM | POA: Diagnosis not present

## 2016-03-08 DIAGNOSIS — M7989 Other specified soft tissue disorders: Secondary | ICD-10-CM | POA: Diagnosis not present

## 2016-03-08 DIAGNOSIS — M0579 Rheumatoid arthritis with rheumatoid factor of multiple sites without organ or systems involvement: Secondary | ICD-10-CM | POA: Diagnosis not present

## 2016-03-09 DIAGNOSIS — E782 Mixed hyperlipidemia: Secondary | ICD-10-CM | POA: Diagnosis not present

## 2016-03-09 DIAGNOSIS — G5761 Lesion of plantar nerve, right lower limb: Secondary | ICD-10-CM | POA: Diagnosis not present

## 2016-03-09 DIAGNOSIS — M0589 Other rheumatoid arthritis with rheumatoid factor of multiple sites: Secondary | ICD-10-CM | POA: Diagnosis not present

## 2016-03-09 DIAGNOSIS — E1121 Type 2 diabetes mellitus with diabetic nephropathy: Secondary | ICD-10-CM | POA: Diagnosis not present

## 2016-03-09 DIAGNOSIS — G4733 Obstructive sleep apnea (adult) (pediatric): Secondary | ICD-10-CM | POA: Diagnosis not present

## 2016-03-09 DIAGNOSIS — G5762 Lesion of plantar nerve, left lower limb: Secondary | ICD-10-CM | POA: Diagnosis not present

## 2016-05-12 DIAGNOSIS — F322 Major depressive disorder, single episode, severe without psychotic features: Secondary | ICD-10-CM | POA: Diagnosis not present

## 2016-05-12 DIAGNOSIS — R5081 Fever presenting with conditions classified elsewhere: Secondary | ICD-10-CM | POA: Diagnosis not present

## 2016-05-12 DIAGNOSIS — J102 Influenza due to other identified influenza virus with gastrointestinal manifestations: Secondary | ICD-10-CM | POA: Diagnosis not present

## 2016-05-17 DIAGNOSIS — F321 Major depressive disorder, single episode, moderate: Secondary | ICD-10-CM | POA: Diagnosis not present

## 2016-05-17 DIAGNOSIS — J180 Bronchopneumonia, unspecified organism: Secondary | ICD-10-CM | POA: Diagnosis not present

## 2016-05-17 DIAGNOSIS — E119 Type 2 diabetes mellitus without complications: Secondary | ICD-10-CM | POA: Diagnosis not present

## 2016-06-02 DIAGNOSIS — Z6832 Body mass index (BMI) 32.0-32.9, adult: Secondary | ICD-10-CM | POA: Diagnosis not present

## 2016-06-02 DIAGNOSIS — E669 Obesity, unspecified: Secondary | ICD-10-CM | POA: Diagnosis not present

## 2016-06-02 DIAGNOSIS — M0579 Rheumatoid arthritis with rheumatoid factor of multiple sites without organ or systems involvement: Secondary | ICD-10-CM | POA: Diagnosis not present

## 2016-06-02 DIAGNOSIS — M150 Primary generalized (osteo)arthritis: Secondary | ICD-10-CM | POA: Diagnosis not present

## 2016-06-14 DIAGNOSIS — J069 Acute upper respiratory infection, unspecified: Secondary | ICD-10-CM | POA: Diagnosis not present

## 2016-06-14 DIAGNOSIS — R05 Cough: Secondary | ICD-10-CM | POA: Diagnosis not present

## 2016-06-14 DIAGNOSIS — N3000 Acute cystitis without hematuria: Secondary | ICD-10-CM | POA: Diagnosis not present

## 2016-06-14 DIAGNOSIS — R5081 Fever presenting with conditions classified elsewhere: Secondary | ICD-10-CM | POA: Diagnosis not present

## 2016-06-14 DIAGNOSIS — R51 Headache: Secondary | ICD-10-CM | POA: Diagnosis not present

## 2016-06-14 DIAGNOSIS — R509 Fever, unspecified: Secondary | ICD-10-CM | POA: Diagnosis not present

## 2016-06-14 DIAGNOSIS — E860 Dehydration: Secondary | ICD-10-CM | POA: Diagnosis not present

## 2016-06-14 DIAGNOSIS — R194 Change in bowel habit: Secondary | ICD-10-CM | POA: Diagnosis not present

## 2016-06-14 DIAGNOSIS — M542 Cervicalgia: Secondary | ICD-10-CM | POA: Diagnosis not present

## 2016-06-14 DIAGNOSIS — S161XXA Strain of muscle, fascia and tendon at neck level, initial encounter: Secondary | ICD-10-CM | POA: Diagnosis not present

## 2016-07-11 DIAGNOSIS — J189 Pneumonia, unspecified organism: Secondary | ICD-10-CM | POA: Diagnosis not present

## 2016-07-27 DIAGNOSIS — J069 Acute upper respiratory infection, unspecified: Secondary | ICD-10-CM | POA: Diagnosis not present

## 2016-08-09 DIAGNOSIS — M25562 Pain in left knee: Secondary | ICD-10-CM | POA: Diagnosis not present

## 2016-08-09 DIAGNOSIS — M25561 Pain in right knee: Secondary | ICD-10-CM | POA: Diagnosis not present

## 2016-08-09 DIAGNOSIS — M150 Primary generalized (osteo)arthritis: Secondary | ICD-10-CM | POA: Diagnosis not present

## 2016-08-09 DIAGNOSIS — Z6831 Body mass index (BMI) 31.0-31.9, adult: Secondary | ICD-10-CM | POA: Diagnosis not present

## 2016-08-09 DIAGNOSIS — M0579 Rheumatoid arthritis with rheumatoid factor of multiple sites without organ or systems involvement: Secondary | ICD-10-CM | POA: Diagnosis not present

## 2016-08-09 DIAGNOSIS — E669 Obesity, unspecified: Secondary | ICD-10-CM | POA: Diagnosis not present

## 2016-08-18 DIAGNOSIS — G4733 Obstructive sleep apnea (adult) (pediatric): Secondary | ICD-10-CM | POA: Diagnosis not present

## 2016-08-18 DIAGNOSIS — E782 Mixed hyperlipidemia: Secondary | ICD-10-CM | POA: Diagnosis not present

## 2016-08-18 DIAGNOSIS — M0589 Other rheumatoid arthritis with rheumatoid factor of multiple sites: Secondary | ICD-10-CM | POA: Diagnosis not present

## 2016-08-18 DIAGNOSIS — E1121 Type 2 diabetes mellitus with diabetic nephropathy: Secondary | ICD-10-CM | POA: Diagnosis not present

## 2016-08-22 DIAGNOSIS — E1121 Type 2 diabetes mellitus with diabetic nephropathy: Secondary | ICD-10-CM | POA: Diagnosis not present

## 2016-08-22 DIAGNOSIS — E782 Mixed hyperlipidemia: Secondary | ICD-10-CM | POA: Diagnosis not present

## 2016-09-14 ENCOUNTER — Emergency Department (HOSPITAL_COMMUNITY): Payer: Medicare Other

## 2016-09-14 ENCOUNTER — Observation Stay (HOSPITAL_COMMUNITY): Payer: Medicare Other

## 2016-09-14 ENCOUNTER — Observation Stay (HOSPITAL_COMMUNITY)
Admission: EM | Admit: 2016-09-14 | Discharge: 2016-09-15 | Disposition: A | Discharge status: Home / Self Care | Payer: Medicare Other | Attending: Nephrology | Admitting: Nephrology

## 2016-09-14 DIAGNOSIS — F329 Major depressive disorder, single episode, unspecified: Secondary | ICD-10-CM | POA: Insufficient documentation

## 2016-09-14 DIAGNOSIS — R05 Cough: Secondary | ICD-10-CM | POA: Diagnosis not present

## 2016-09-14 DIAGNOSIS — Z7982 Long term (current) use of aspirin: Secondary | ICD-10-CM | POA: Insufficient documentation

## 2016-09-14 DIAGNOSIS — R29810 Facial weakness: Secondary | ICD-10-CM | POA: Diagnosis not present

## 2016-09-14 DIAGNOSIS — M6281 Muscle weakness (generalized): Secondary | ICD-10-CM | POA: Insufficient documentation

## 2016-09-14 DIAGNOSIS — K219 Gastro-esophageal reflux disease without esophagitis: Secondary | ICD-10-CM | POA: Insufficient documentation

## 2016-09-14 DIAGNOSIS — Z91013 Allergy to seafood: Secondary | ICD-10-CM | POA: Diagnosis not present

## 2016-09-14 DIAGNOSIS — M797 Fibromyalgia: Secondary | ICD-10-CM | POA: Diagnosis not present

## 2016-09-14 DIAGNOSIS — R51 Headache: Secondary | ICD-10-CM | POA: Diagnosis not present

## 2016-09-14 DIAGNOSIS — E785 Hyperlipidemia, unspecified: Secondary | ICD-10-CM | POA: Diagnosis not present

## 2016-09-14 DIAGNOSIS — Z87891 Personal history of nicotine dependence: Secondary | ICD-10-CM | POA: Insufficient documentation

## 2016-09-14 DIAGNOSIS — R531 Weakness: Secondary | ICD-10-CM | POA: Insufficient documentation

## 2016-09-14 DIAGNOSIS — G4733 Obstructive sleep apnea (adult) (pediatric): Secondary | ICD-10-CM | POA: Diagnosis present

## 2016-09-14 DIAGNOSIS — Z7951 Long term (current) use of inhaled steroids: Secondary | ICD-10-CM | POA: Insufficient documentation

## 2016-09-14 DIAGNOSIS — R200 Anesthesia of skin: Secondary | ICD-10-CM | POA: Diagnosis not present

## 2016-09-14 DIAGNOSIS — H539 Unspecified visual disturbance: Secondary | ICD-10-CM

## 2016-09-14 DIAGNOSIS — E039 Hypothyroidism, unspecified: Secondary | ICD-10-CM | POA: Diagnosis present

## 2016-09-14 DIAGNOSIS — I10 Essential (primary) hypertension: Secondary | ICD-10-CM | POA: Insufficient documentation

## 2016-09-14 DIAGNOSIS — M069 Rheumatoid arthritis, unspecified: Secondary | ICD-10-CM | POA: Insufficient documentation

## 2016-09-14 DIAGNOSIS — E782 Mixed hyperlipidemia: Secondary | ICD-10-CM | POA: Diagnosis present

## 2016-09-14 DIAGNOSIS — G459 Transient cerebral ischemic attack, unspecified: Secondary | ICD-10-CM | POA: Diagnosis present

## 2016-09-14 DIAGNOSIS — J449 Chronic obstructive pulmonary disease, unspecified: Secondary | ICD-10-CM | POA: Diagnosis not present

## 2016-09-14 DIAGNOSIS — H538 Other visual disturbances: Secondary | ICD-10-CM | POA: Diagnosis not present

## 2016-09-14 DIAGNOSIS — G458 Other transient cerebral ischemic attacks and related syndromes: Secondary | ICD-10-CM | POA: Diagnosis not present

## 2016-09-14 DIAGNOSIS — Z79899 Other long term (current) drug therapy: Secondary | ICD-10-CM | POA: Diagnosis not present

## 2016-09-14 DIAGNOSIS — R202 Paresthesia of skin: Secondary | ICD-10-CM | POA: Diagnosis not present

## 2016-09-14 DIAGNOSIS — Z91041 Radiographic dye allergy status: Secondary | ICD-10-CM | POA: Insufficient documentation

## 2016-09-14 DIAGNOSIS — Z85828 Personal history of other malignant neoplasm of skin: Secondary | ICD-10-CM | POA: Insufficient documentation

## 2016-09-14 DIAGNOSIS — Z885 Allergy status to narcotic agent status: Secondary | ICD-10-CM | POA: Insufficient documentation

## 2016-09-14 DIAGNOSIS — R41841 Cognitive communication deficit: Secondary | ICD-10-CM | POA: Diagnosis not present

## 2016-09-14 DIAGNOSIS — M1990 Unspecified osteoarthritis, unspecified site: Secondary | ICD-10-CM | POA: Diagnosis not present

## 2016-09-14 DIAGNOSIS — R519 Headache, unspecified: Secondary | ICD-10-CM

## 2016-09-14 DIAGNOSIS — E119 Type 2 diabetes mellitus without complications: Secondary | ICD-10-CM | POA: Diagnosis not present

## 2016-09-14 HISTORY — DX: Transient cerebral ischemic attack, unspecified: G45.9

## 2016-09-14 LAB — APTT: aPTT: 26 seconds (ref 24–36)

## 2016-09-14 LAB — CBC
HCT: 42.6 % (ref 36.0–46.0)
Hemoglobin: 14.7 g/dL (ref 12.0–15.0)
MCH: 31.1 pg (ref 26.0–34.0)
MCHC: 34.5 g/dL (ref 30.0–36.0)
MCV: 90.3 fL (ref 78.0–100.0)
Platelets: 277 10*3/uL (ref 150–400)
RBC: 4.72 MIL/uL (ref 3.87–5.11)
RDW: 12.7 % (ref 11.5–15.5)
WBC: 7.8 10*3/uL (ref 4.0–10.5)

## 2016-09-14 LAB — BASIC METABOLIC PANEL
Anion gap: 8 (ref 5–15)
BUN: 17 mg/dL (ref 6–20)
CO2: 26 mmol/L (ref 22–32)
Calcium: 9.5 mg/dL (ref 8.9–10.3)
Chloride: 107 mmol/L (ref 101–111)
Creatinine, Ser: 0.76 mg/dL (ref 0.44–1.00)
GFR calc Af Amer: >60 mL/min (ref >60)
GFR calc non Af Amer: >60 mL/min (ref >60)
Glucose, Bld: 175 mg/dL — ABNORMAL HIGH (ref 65–99)
Potassium: 4.3 mmol/L (ref 3.5–5.1)
Sodium: 141 mmol/L (ref 135–145)

## 2016-09-14 LAB — URINALYSIS, ROUTINE W REFLEX MICROSCOPIC
Bacteria, UA: NONE SEEN
Bilirubin Urine: NEGATIVE
Glucose, UA: NEGATIVE mg/dL
Hgb urine dipstick: NEGATIVE
Ketones, ur: NEGATIVE mg/dL
Nitrite: NEGATIVE
Protein, ur: NEGATIVE mg/dL
Specific Gravity, Urine: 1.009 (ref 1.005–1.030)
Squamous Epithelial / LPF: NONE SEEN
pH: 6 (ref 5.0–8.0)

## 2016-09-14 LAB — RAPID URINE DRUG SCREEN, HOSP PERFORMED
Amphetamines: NOT DETECTED
Barbiturates: NOT DETECTED
Benzodiazepines: NOT DETECTED
Cocaine: NOT DETECTED
Opiates: NOT DETECTED
Tetrahydrocannabinol: NOT DETECTED

## 2016-09-14 LAB — HEPATIC FUNCTION PANEL
ALT: 27 U/L (ref 14–54)
AST: 20 U/L (ref 15–41)
Albumin: 3.6 g/dL (ref 3.5–5.0)
Alkaline Phosphatase: 112 U/L (ref 38–126)
Bilirubin, Direct: <0.1 mg/dL — ABNORMAL LOW (ref 0.1–0.5)
Total Bilirubin: 0.5 mg/dL (ref 0.3–1.2)
Total Protein: 7.5 g/dL (ref 6.5–8.1)

## 2016-09-14 LAB — DIFFERENTIAL
Basophils Absolute: 0 10*3/uL (ref 0.0–0.1)
Basophils Relative: 0 %
Eosinophils Absolute: 0.1 10*3/uL (ref 0.0–0.7)
Eosinophils Relative: 1 %
Lymphocytes Relative: 22 %
Lymphs Abs: 1.7 10*3/uL (ref 0.7–4.0)
Monocytes Absolute: 0.2 10*3/uL (ref 0.1–1.0)
Monocytes Relative: 2 %
Neutro Abs: 5.7 10*3/uL (ref 1.7–7.7)
Neutrophils Relative %: 75 %

## 2016-09-14 LAB — I-STAT TROPONIN, ED: Troponin i, poc: 0.01 ng/mL (ref 0.00–0.08)

## 2016-09-14 LAB — PROTIME-INR
INR: 0.98
Prothrombin Time: 12.9 seconds (ref 11.4–15.2)

## 2016-09-14 LAB — CREATININE, SERUM
Creatinine, Ser: 0.62 mg/dL (ref 0.44–1.00)
GFR calc Af Amer: >60 mL/min (ref >60)
GFR calc non Af Amer: >60 mL/min (ref >60)

## 2016-09-14 LAB — ETHANOL: Alcohol, Ethyl (B): <5 mg/dL (ref <5)

## 2016-09-14 MED ORDER — METHOTREXATE 2.5 MG PO TABS
15.0000 mg | ORAL_TABLET | ORAL | Status: DC
  Administered 2016-09-15: 15 mg via ORAL
  Filled 2016-09-14: qty 6

## 2016-09-14 MED ORDER — VENLAFAXINE HCL ER 75 MG PO CP24
75.0000 mg | ORAL_CAPSULE | Freq: Every day | ORAL | Status: DC
  Administered 2016-09-14: 75 mg via ORAL
  Filled 2016-09-14: qty 1

## 2016-09-14 MED ORDER — TIZANIDINE HCL 4 MG PO TABS: 4.0000 mg | ORAL_TABLET | Freq: Every evening | ORAL | Status: DC | PRN

## 2016-09-14 MED ORDER — ACETAMINOPHEN 650 MG RE SUPP: 650.0000 mg | RECTAL | Status: DC | PRN

## 2016-09-14 MED ORDER — SODIUM CHLORIDE 0.9 % IV SOLN
INTRAVENOUS | Status: DC
  Administered 2016-09-14: 22:00:00 via INTRAVENOUS

## 2016-09-14 MED ORDER — PROCHLORPERAZINE EDISYLATE 5 MG/ML IJ SOLN
10.0000 mg | Freq: Once | INTRAMUSCULAR | Status: AC
  Administered 2016-09-14: 10 mg via INTRAVENOUS
  Filled 2016-09-14: qty 2

## 2016-09-14 MED ORDER — SENNOSIDES-DOCUSATE SODIUM 8.6-50 MG PO TABS: 1.0000 | ORAL_TABLET | Freq: Every evening | ORAL | Status: DC | PRN

## 2016-09-14 MED ORDER — ALBUTEROL SULFATE (2.5 MG/3ML) 0.083% IN NEBU: 3.0000 mL | INHALATION_SOLUTION | RESPIRATORY_TRACT | Status: DC | PRN

## 2016-09-14 MED ORDER — DIPHENHYDRAMINE HCL 50 MG/ML IJ SOLN
25.0000 mg | Freq: Once | INTRAMUSCULAR | Status: AC
  Administered 2016-09-14: 25 mg via INTRAVENOUS
  Filled 2016-09-14: qty 1

## 2016-09-14 MED ORDER — MOMETASONE FURO-FORMOTEROL FUM 200-5 MCG/ACT IN AERO
2.0000 | INHALATION_SPRAY | Freq: Two times a day (BID) | RESPIRATORY_TRACT | Status: DC
  Administered 2016-09-15: 2 via RESPIRATORY_TRACT
  Filled 2016-09-14: qty 8.8

## 2016-09-14 MED ORDER — BISMUTH SUBSALICYLATE 262 MG/15ML PO SUSP: 30.0000 mL | Freq: Four times a day (QID) | ORAL | Status: DC | PRN

## 2016-09-14 MED ORDER — FOLIC ACID 1 MG PO TABS
1.0000 mg | ORAL_TABLET | ORAL | Status: DC
  Administered 2016-09-15: 1 mg via ORAL
  Filled 2016-09-14: qty 1

## 2016-09-14 MED ORDER — STROKE: EARLY STAGES OF RECOVERY BOOK
Freq: Once | Status: AC
  Administered 2016-09-14: 22:00:00
  Filled 2016-09-14: qty 1

## 2016-09-14 MED ORDER — ACETAMINOPHEN 160 MG/5ML PO SOLN
650.0000 mg | ORAL | Status: DC | PRN
Start: 2016-09-14 — End: 2016-09-15

## 2016-09-14 MED ORDER — ACETAMINOPHEN 325 MG PO TABS
650.0000 mg | ORAL_TABLET | Freq: Once | ORAL | Status: AC
  Administered 2016-09-14: 650 mg via ORAL
  Filled 2016-09-14: qty 2

## 2016-09-14 MED ORDER — ACETAMINOPHEN 325 MG PO TABS: 650.0000 mg | ORAL_TABLET | ORAL | Status: DC | PRN

## 2016-09-14 MED ORDER — PRAVASTATIN SODIUM 20 MG PO TABS
20.0000 mg | ORAL_TABLET | Freq: Every day | ORAL | Status: DC
  Administered 2016-09-14: 20 mg via ORAL
  Filled 2016-09-14: qty 1

## 2016-09-14 MED ORDER — PREDNISONE 5 MG PO TABS
5.0000 mg | ORAL_TABLET | Freq: Every day | ORAL | Status: DC
  Administered 2016-09-15: 5 mg via ORAL
  Filled 2016-09-14: qty 1

## 2016-09-14 MED ORDER — LORAZEPAM 2 MG/ML IJ SOLN: 0.5000 mg | Freq: Once | INTRAMUSCULAR | Status: DC | PRN

## 2016-09-14 MED ORDER — ENOXAPARIN SODIUM 40 MG/0.4ML ~~LOC~~ SOLN
40.0000 mg | SUBCUTANEOUS | Status: DC
  Administered 2016-09-14: 40 mg via SUBCUTANEOUS
  Filled 2016-09-14: qty 0.4

## 2016-09-14 MED ORDER — SODIUM CHLORIDE 0.9 % IV BOLUS (SEPSIS)
1000.0000 mL | Freq: Once | INTRAVENOUS | Status: AC
  Administered 2016-09-14: 1000 mL via INTRAVENOUS

## 2016-09-14 NOTE — ED Provider Notes (Signed)
[facility] DEPT Provider Note   CSN: [id number] Arrival date & time: 09/14/16  1011     History   Chief Complaint Chief Complaint  Patient presents with  . Nausea  . Headache  . vision changes    HPI Brenda Cochran is a 66 y.o. female with a past medical history significant for asthma, COPD, diabetes, and GERD who presents with multiple complaints including a 2 day history of right facial tingling, and onset of left face numbness, left arm pain, left arm numbness, right eye vision blurriness, and disorientation. Patient says that one week ago, she had headache on the right side of her face. She said she had some right facial tingling at that time. She said that 2 days ago, her right facial tingling returned and has been persistent. She reports that she has had intermittent headaches on that side as well. She says that this morning, she was driving her grandchildren to school when she had onset of pain and numbness in her left arm, numbness in her left face, and blurry vision. She was able to realize it was in her right eye in the lateral vision for blurriness. She has never had this before. She denied diplopia. She denied chest pain, shortness of breath but does report palpitations. She got disoriented and confused and was not sure where she was driving.  She denies any fevers, chills, cough, congestion. She reports some diarrhea several days ago that has resolved. She denies constipation or any urinary symptoms. She denies any history of similar symptoms. She reports previous lightheadedness that has resolved.  The history is provided by the patient and medical records. No language interpreter was used.  Neurologic Problem  This is a new problem. The current episode started 2 days ago. The problem occurs constantly. The problem has not changed since onset.Associated symptoms include headaches. Pertinent negatives include no chest pain, no abdominal pain and no shortness of breath. Nothing  aggravates the symptoms. Nothing relieves the symptoms. She has tried nothing for the symptoms. The treatment provided no relief.    Past Medical History:  Diagnosis Date  . Asthma   . COPD (chronic obstructive pulmonary disease)   . Depression   . Fibromyalgia   . GERD (gastroesophageal reflux disease)   . Hyperlipidemia   . Hypothyroidism (acquired)   . Osteoarthritis   . RA (rheumatoid arthritis)    [name]  . Skin cancer    face  . Type 2 diabetes mellitus     Patient Active Problem List   Diagnosis Date Noted  . Obesity (BMI 30-39.9) 01/13/2014  . OSA (obstructive sleep apnea) 01/13/2014  . Asthma, chronic obstructive, with acute exacerbation ([place])   . Osteoarthritis   . Hyperlipidemia   . Fibromyalgia   . RA (rheumatoid arthritis) ([place])   . Type 2 diabetes mellitus ([place])   . Skin cancer   . Hypothyroidism (acquired)   . GERD (gastroesophageal reflux disease)   . Depression     Past Surgical History:  Procedure Laterality Date  . ABDOMINAL HYSTERECTOMY  1984  . BREAST LUMPECTOMY  1976   left, benign  . CHOLECYSTECTOMY  09/05/10    OB History    No data available       Home Medications    Prior to Admission medications   Medication Sig Start Date End Date Taking? Authorizing Provider  atorvastatin (LIPITOR) 40 MG tablet Take 40 mg by mouth daily.    Historical Provider, MD  Canagliflozin [facility]) 100  MG TABS Take 100 mg by mouth. Take 1 tablet by mouth daily    Historical Provider, MD  cholecalciferol (VITAMIN D) 1000 UNITS tablet Take 1,000 Units by mouth. Take 2 times a  week    Historical Provider, MD  fluticasone (FLONASE) 50 MCG/ACT nasal spray Place 2 sprays into both nostrils 2 (two) times daily. 01/13/14   Elsie Stain, MD  furosemide (LASIX) 20 MG tablet Take 20 mg by mouth. Daily as needed 1 tablet    Historical Provider, MD  HYDROcodone-acetaminophen (NORCO/VICODIN) 5-325 MG per tablet Take 1 tablet by mouth every 6 (six) hours as  needed for moderate pain.    Historical Provider, MD  Liraglutide (VICTOZA [place]) Inject into the skin. 0.6 mg SQ every day    Historical Provider, MD  methotrexate (RHEUMATREX) 2.5 MG tablet Take 2.5 mg by mouth. 2.5 mg tablets by mouth take 8 one time a week    Historical Provider, MD  mometasone-formoterol (DULERA) 200-5 MCG/ACT AERO Inhale 2 puffs into the lungs 2 (two) times daily.    Historical Provider, MD  omeprazole (PRILOSEC) 20 MG capsule Take 1 capsule (20 mg total) by mouth daily. 01/13/14   Elsie Stain, MD  predniSONE (DELTASONE) 10 MG tablet Take 4 for two days three for two days two for two days one for two days 01/13/14   Elsie Stain, MD  predniSONE (DELTASONE) 5 MG tablet Hold while on 10mg  prednisone 01/13/14   Elsie Stain, MD  sertraline (ZOLOFT) 100 MG tablet Take 100 mg by mouth. 2 tablets 1 time a day    Historical Provider, MD  thyroid (ARMOUR) 60 MG tablet Take 60 mg by mouth daily before breakfast.    Historical Provider, MD    Family History Family History  Problem Relation Age of Onset  . Breast cancer Sister   . Lymphoma Sister   . Multiple sclerosis Brother   . Muscular dystrophy Brother   . CAD    . Diabetes    . Hypertension    . Seizures    . Lung disease Mother     never smoker  . Lung disease Maternal Uncle   . Lung disease Maternal Uncle   . Lung cancer Father   . Bone cancer Father     Social History Social History  Substance Use Topics  . Smoking status: Former Smoker    Packs/day: 3.00    Years: 30.00    Types: Cigarettes    Quit date: 06/04/2010  . Smokeless tobacco: Not on file  . Alcohol use No     Allergies   Contrast media [iodinated diagnostic agents]; Cymbalta [duloxetine hcl]; Metformin and related; Oxycontin [oxycodone hcl]; Shellfish allergy; and Ultram [tramadol]   Review of Systems Review of Systems  Constitutional: Negative for chills, diaphoresis, fatigue and fever.  HENT: Negative for congestion and  rhinorrhea.   Eyes: Positive for photophobia and visual disturbance.  Respiratory: Negative for chest tightness, shortness of breath, wheezing and stridor.   Cardiovascular: Positive for palpitations. Negative for chest pain and leg swelling.  Gastrointestinal: Negative for abdominal pain, diarrhea, nausea and vomiting.  Genitourinary: Negative for dysuria, flank pain and frequency.  Musculoskeletal: Negative for back pain, neck pain and neck stiffness.  Skin: Negative for rash and wound.  Neurological: Positive for light-headedness, numbness and headaches. Negative for dizziness, facial asymmetry, speech difficulty and weakness.  Psychiatric/Behavioral: Negative for agitation.  All other systems reviewed and are negative.    Physical Exam Updated  Vital Signs There were no vitals taken for this visit.  Physical Exam  Constitutional: She is oriented to person, place, and time. She appears well-developed and well-nourished. No distress.  HENT:  Head: Normocephalic and atraumatic.  Right Ear: External ear normal.  Left Ear: External ear normal.  Nose: Nose normal.  Mouth/Throat: Oropharynx is clear and moist. No oropharyngeal exudate.  Eyes: Conjunctivae and EOM are normal. Pupils are equal, round, and reactive to light.  Neck: Normal range of motion. Neck supple.  Cardiovascular: Normal rate, normal heart sounds and intact distal pulses.   No murmur heard. Pulmonary/Chest: Effort normal. No stridor. No respiratory distress. She has no wheezes. She has no rales. She exhibits no tenderness.  Abdominal: She exhibits no distension. There is no tenderness. There is no rebound.  Musculoskeletal: She exhibits no tenderness.  Neurological: She is alert and oriented to person, place, and time. She has normal reflexes. She is not disoriented. She displays no tremor. A cranial nerve deficit and sensory deficit is present. She exhibits normal muscle tone. Coordination normal. GCS eye subscore is  4. GCS verbal subscore is 5. GCS motor subscore is 6.  Decreased vision in lateral visual fields of the right eye. Normal extraocular movement. Numbness in left face and left arm. Normal grip strength bilaterally. No lower extremity weakness or numbness or tingling. Tingling in the right face.  Skin: Skin is warm. Capillary refill takes less than 2 seconds. No rash noted. She is not diaphoretic. No erythema.  Psychiatric: She has a normal mood and affect.  Nursing note and vitals reviewed.    ED Treatments / Results  Labs (all labs ordered are listed, but only abnormal results are displayed) Labs Reviewed  BASIC METABOLIC PANEL - Abnormal; Notable for the following:       Result Value   Glucose, Bld 175 (*)    All other components within normal limits  URINALYSIS, ROUTINE W REFLEX MICROSCOPIC - Abnormal; Notable for the following:    Color, Urine STRAW (*)    Leukocytes, UA TRACE (*)    All other components within normal limits  HEPATIC FUNCTION PANEL - Abnormal; Notable for the following:    Bilirubin, Direct <0.1 (*)    All other components within normal limits  CBC  ETHANOL  PROTIME-INR  APTT  RAPID URINE DRUG SCREEN, HOSP PERFORMED  DIFFERENTIAL  CREATININE, SERUM  HIV ANTIBODY (ROUTINE TESTING)  HEMOGLOBIN A1C  LIPID PANEL  I-STAT TROPOININ, ED    EKG  EKG Interpretation  Date/Time:  Thursday September 14 2016 10:22:37 EDT Ventricular Rate:  70 PR Interval:    QRS Duration: 89 QT Interval:  406 QTC Calculation: 439 R Axis:   7 Text Interpretation:  Sinus rhythm Borderline short PR interval Abnormal R-wave progression, early transition Baseline wander in lead(s) V6 No prior ECG for comparison.  No STEMI Confirmed by Sherry Ruffing MD, [place] [phone]) on 09/14/2016 10:31:09 AM       Radiology Dg Chest 2 View  Result Date: 09/14/2016 CLINICAL DATA:  Coughing congestion. EXAM: CHEST  2 VIEW COMPARISON:  06/14/2016 FINDINGS: The lungs are clear wiithout focal pneumonia,  edema, pneumothorax or pleural effusion. The cardiopericardial silhouette is within normal limits for size. The visualized bony structures of the thorax are intact. Telemetry leads overlie the chest. IMPRESSION: No active cardiopulmonary disease. Electronically Signed   By: Misty Stanley M.D.   On: 09/14/2016 11:33   Ct Head Wo Contrast  Result Date: 09/14/2016 CLINICAL DATA:  Left facial and  upper extremity weakness. EXAM: CT HEAD WITHOUT CONTRAST TECHNIQUE: Contiguous axial images were obtained from the base of the skull through the vertex without intravenous contrast. COMPARISON:  CT scan of June 14, 2016. FINDINGS: Brain: No evidence of acute infarction, hemorrhage, hydrocephalus, extra-axial collection or mass lesion/mass effect. Vascular: Atherosclerosis of carotid siphons is noted. Skull: Normal. Negative for fracture or focal lesion. Sinuses/Orbits: Right sphenoid sinusitis is noted. Other: None. IMPRESSION: Mild right sphenoid sinusitis. No acute intracranial abnormality seen. Electronically Signed   By: Marijo Conception, M.D.   On: 09/14/2016 11:48   Mr Brain Wo Contrast  Result Date: 09/14/2016 CLINICAL DATA:  66 y/o F; right eye vision changes, left face and left arm numbness, right face tingling, and headaches. EXAM: MRI HEAD WITHOUT CONTRAST TECHNIQUE: Multiplanar, multiecho pulse sequences of the brain and surrounding structures were obtained without intravenous contrast. COMPARISON:  09/14/2016 CT head FINDINGS: Brain: No acute infarction, hemorrhage, hydrocephalus, extra-axial collection or mass lesion. Few nonspecific foci of T2 FLAIR hyperintensity within subcortical and periventricular white matter are compatible with mild chronic microvascular ischemic changes. Mild brain parenchymal volume loss. Vascular: Normal flow voids. Skull and upper cervical spine: Normal marrow signal. Sinuses/Orbits: Right-sided ethmoid and sphenoid sinus mucosal thickening. Trace right mastoid tip effusion.  Orbits are unremarkable. Other: None. IMPRESSION: 1. No acute intracranial abnormality identified. 2. Mild chronic microvascular ischemic changes and mild parenchymal volume loss of the brain. 3. Paranasal sinus disease and right ethmoid air cells and sphenoid sinus. Electronically Signed   By: Kristine Garbe M.D.   On: 09/14/2016 14:33    Procedures Procedures (including critical care time)  Medications Ordered in ED Medications  albuterol (PROVENTIL) (2.5 MG/3ML) 0.083% nebulizer solution 3 mL (not administered)  bismuth subsalicylate (PEPTO BISMOL) 262 MG/15ML suspension 30 mL (not administered)  mometasone-formoterol (DULERA) 200-5 MCG/ACT inhaler 2 puff (not administered)  folic acid (FOLVITE) tablet 1 mg (not administered)  methotrexate (RHEUMATREX) tablet 15 mg (not administered)  pravastatin (PRAVACHOL) tablet 20 mg (20 mg Oral Given 09/14/16 2156)  predniSONE (DELTASONE) tablet 5 mg (not administered)  tiZANidine (ZANAFLEX) tablet 4 mg (not administered)  venlafaxine XR (EFFEXOR-XR) 24 hr capsule 75 mg (75 mg Oral Given 09/14/16 2156)  0.9 %  sodium chloride infusion ( Intravenous New Bag/Given 09/14/16 2203)  acetaminophen (TYLENOL) tablet 650 mg (not administered)    Or  acetaminophen (TYLENOL) solution 650 mg (not administered)    Or  acetaminophen (TYLENOL) suppository 650 mg (not administered)  senna-docusate (Senokot-S) tablet 1 tablet (not administered)  enoxaparin (LOVENOX) injection 40 mg (40 mg Subcutaneous Given 09/14/16 2157)  acetaminophen (TYLENOL) tablet 650 mg (650 mg Oral Given 09/14/16 1210)  prochlorperazine (COMPAZINE) injection 10 mg (10 mg Intravenous Given 09/14/16 1441)  diphenhydrAMINE (BENADRYL) injection 25 mg (25 mg Intravenous Given 09/14/16 1441)  sodium chloride 0.9 % bolus 1,000 mL (1,000 mLs Intravenous New Bag/Given 09/14/16 1441)   stroke: mapping our early stages of recovery book ( Does not apply Given 09/14/16 2203)     Initial  Impression / Assessment and Plan / ED Course  I have reviewed the triage vital signs and the nursing notes.  Pertinent labs & imaging results that were available during my care of the patient were reviewed by me and considered in my medical decision making (see chart for details).     Brenda Cochran is a 66 y.o. female with a past medical history significant for asthma, COPD, diabetes, and GERD who presents with multiple complaints including a 2  day history of right facial tingling, and onset of left face numbness, left arm pain, left arm numbness, right eye vision blurriness, and disorientation.   History and exam are seen above. On exam, patient has numbness of the left face and left arm. Patient has no coordination abnormal. Patient has right eye lateral visual field deficits. Patient has normal extraocular movements. Patient subjectively reports right facial tingling although she says her sensation is better on the right than the left. Patient has no abnormalities in her large cavities. Patient's chest is nontender and lungs are clear. Abdomen is nontender. CVA area nontender.  Based on patient's symptoms, there is clinical concern for TIA versus stroke. Patient's last normal was over 2 days ago. Code stroke was not activated.   Patient will have workup initiated with CT head. Chart shows that patient has contrast allergy. Despite visual changes, we'll not order CTA of the head and neck at this time.  Anticipate speaking with neurology after initial workup is completed.  CT of the head showed no acute intracranial abnormality but did show evidence of chronic microvascular disease. Patient subsequently reported that she does remember having "blockages in her carotids of the neck".  Neurology was called and they recommended MRI and admission for further management.  MRI showed no evidence of acute stroke at this time. Hospitalist team was called for admission to rule out TIA and do other workup  of carotids and likely echo.  Patient continued to have headache and was given a headache cocktail. Patient reported improvement in headache significantly however, she continued to have a left face and left arm numbness. Patient admitted in stable condition for further management.     Final Clinical Impressions(s) / ED Diagnoses   Final diagnoses:  Acute nonintractable headache, unspecified headache type  Vision changes  Numbness and tingling of left side of face  Left arm numbness     Clinical Impression: 1. Acute nonintractable headache, unspecified headache type   2. Numbness and tingling in left upper extremity   3. Vision changes   4. Numbness and tingling of left side of face   5. Left arm numbness     Disposition: Admit to hospitalist service    Courtney Paris, MD 09/14/16 2218

## 2016-09-14 NOTE — ED Triage Notes (Signed)
Pt reports nausea, left arm pain and vision changes started 830 Am . Also reports lethargy . Arm pain radiating to neck . Family hx MI. Hx cardiac illness.

## 2016-09-14 NOTE — ED Notes (Signed)
ED Provider at bedside. 

## 2016-09-14 NOTE — H&P (Addendum)
History and Physical    Brenda Cochran ZCH:[id number] DOB: 05/17/1951 DOA: 09/14/2016  PCP: Rochel Brome, MD  Patient coming from: Home.   I have personally briefly reviewed patient's old medical records in [facility]  Chief Complaint: blurry vision right eye, left face and left arm numbness and tingling.   HPI: Brenda Cochran is a 66 y.o. female with medical history significant of Asthma, RA on methotrexate, DM, Hypothyroidism, carotid artery diseases who present complaining of left face, mouth left arm numbness and tingling that started 2 days prior to admission. She is also report right eye blurry vision since early this morning. She denies focal weakness, or slurred speech.  She had problems over the winter with viral infection, it took her long time to recover from viral illness.   ED Course: WBC 7, hb 14, cr 0.76, MRI no acute abnormalities, CT head sphenoid  Sinusitis , no acute abnormalities.   Review of Systems: As per HPI otherwise 10 point review of systems negative.    Past Medical History:  Diagnosis Date  . Asthma   . COPD (chronic obstructive pulmonary disease)   . Depression   . Fibromyalgia   . GERD (gastroesophageal reflux disease)   . Hyperlipidemia   . Hypothyroidism (acquired)   . Osteoarthritis   . RA (rheumatoid arthritis)    [name]  . Skin cancer    face  . Type 2 diabetes mellitus     Past Surgical History:  Procedure Laterality Date  . ABDOMINAL HYSTERECTOMY  1984  . BREAST LUMPECTOMY  1976   left, benign  . CHOLECYSTECTOMY  09/05/10     reports that she quit smoking about 6 years ago. Her smoking use included Cigarettes. She has a 90.00 pack-year smoking history. She does not have any smokeless tobacco history on file. She reports that she does not drink alcohol or use drugs.  Allergies  Allergen Reactions  . Shellfish Allergy Anaphylaxis  . Contrast Media [Iodinated Diagnostic Agents] Rash  . Metformin And Related Nausea And  Vomiting  . Cymbalta [Duloxetine Hcl] Nausea And Vomiting  . Ultram [Tramadol] Nausea And Vomiting  . Oxycontin [Oxycodone Hcl] Palpitations    Family History  Problem Relation Age of Onset  . Breast cancer Sister   . Lymphoma Sister   . Multiple sclerosis Brother   . Muscular dystrophy Brother   . CAD    . Diabetes    . Hypertension    . Seizures    . Lung disease Mother     never smoker  . Lung disease Maternal Uncle   . Lung disease Maternal Uncle   . Lung cancer Father   . Bone cancer Father     Prior to Admission medications   Medication Sig Start Date End Date Taking? Authorizing Provider  albuterol (PROVENTIL HFA;VENTOLIN HFA) 108 (90 Base) MCG/ACT inhaler Inhale 2 puffs into the lungs every 4 (four) hours as needed for wheezing or shortness of breath.   Yes Historical Provider, MD  budesonide-formoterol (SYMBICORT) 160-4.5 MCG/ACT inhaler Inhale 2 puffs into the lungs 2 (two) times daily.   Yes Historical Provider, MD  lisinopril (PRINIVIL,ZESTRIL) 5 MG tablet Take 5 mg by mouth daily.   Yes Historical Provider, MD  methotrexate (RHEUMATREX) 2.5 MG tablet Take 2.5 mg by mouth once a week. Caution:Chemotherapy. Protect from light.   Yes Historical Provider, MD  pravastatin (PRAVACHOL) 20 MG tablet Take 20 mg by mouth at bedtime.   Yes Historical Provider, MD  venlafaxine XR (EFFEXOR-XR) 75 MG 24 hr capsule Take 75 mg by mouth daily with breakfast.   Yes Historical Provider, MD  Canagliflozin (INVOKANA) 100 MG TABS Take 100 mg by mouth. Take 1 tablet by mouth daily    Historical Provider, MD  cholecalciferol (VITAMIN D) 1000 UNITS tablet Take 1,000 Units by mouth. Take 2 times a  week    Historical Provider, MD  fluticasone (FLONASE) 50 MCG/ACT nasal spray Place 2 sprays into both nostrils 2 (two) times daily. 01/13/14   Elsie Stain, MD  folic acid (FOLVITE) 1 MG tablet Take 1 mg by mouth every Friday.    Historical Provider, MD  furosemide (LASIX) 20 MG tablet Take 20  mg by mouth. Daily as needed 1 tablet    Historical Provider, MD  HYDROcodone-acetaminophen (NORCO/VICODIN) 5-325 MG per tablet Take 1 tablet by mouth every 6 (six) hours as needed for moderate pain.    Historical Provider, MD  Liraglutide (VICTOZA [place]) Inject into the skin. 0.6 mg SQ every day    Historical Provider, MD  mometasone-formoterol (DULERA) 200-5 MCG/ACT AERO Inhale 2 puffs into the lungs 2 (two) times daily.    Historical Provider, MD  omeprazole (PRILOSEC) 20 MG capsule Take 1 capsule (20 mg total) by mouth daily. 01/13/14   Elsie Stain, MD  predniSONE (DELTASONE) 5 MG tablet Hold while on 10mg  prednisone 01/13/14   Elsie Stain, MD  sertraline (ZOLOFT) 100 MG tablet Take 100 mg by mouth. 2 tablets 1 time a day    Historical Provider, MD  thyroid (ARMOUR) 60 MG tablet Take 60 mg by mouth daily before breakfast.    Historical Provider, MD    Physical Exam: Vitals:   09/14/16 1155 09/14/16 1300 09/14/16 1500 09/14/16 1600  BP:  (!) 151/82 (!) 152/77 (!) 150/82  Pulse:  68 76 71  Resp:  17 16 16   Temp: 98.3 F (36.8 C)     TempSrc:      SpO2:  97% 94% 98%    Constitutional: NAD, calm, comfortable Vitals:   09/14/16 1155 09/14/16 1300 09/14/16 1500 09/14/16 1600  BP:  (!) 151/82 (!) 152/77 (!) 150/82  Pulse:  68 76 71  Resp:  17 16 16   Temp: 98.3 F (36.8 C)     TempSrc:      SpO2:  97% 94% 98%   Eyes: PERRL, lids and conjunctivae normal ENMT: Mucous membranes are moist. Posterior pharynx clear of any exudate or lesions.Normal dentition.  Neck: normal, supple, no masses, no thyromegaly Respiratory: clear to auscultation bilaterally, no wheezing, no crackles. Normal respiratory effort. No accessory muscle use.  Cardiovascular: Regular rate and rhythm, no murmurs / rubs / gallops. No extremity edema. 2+ pedal pulses. No carotid bruits.  Abdomen: no tenderness, no masses palpated. No hepatosplenomegaly. Bowel sounds positive.  Musculoskeletal: no clubbing /  cyanosis. No joint deformity upper and lower extremities. Good ROM, no contractures. Normal muscle tone.  Skin: no rashes, lesions, ulcers. No induration Neurologic: CN 2-12 grossly intact. , DTR normal. Strength 5/5 in all 4.  Psychiatric: Normal judgment and insight. Alert and oriented x 3. Normal mood.     Labs on Admission: I have personally reviewed following labs and imaging studies  CBC:  Recent Labs Lab 09/14/16 1025  WBC 7.8  NEUTROABS 5.7  HGB 14.7  HCT 42.6  MCV 90.3  PLT 175   Basic Metabolic Panel:  Recent Labs Lab 09/14/16 1025  NA 141  K 4.3  CL  107  CO2 26  GLUCOSE 175*  BUN 17  CREATININE 0.76  CALCIUM 9.5   GFR: CrCl cannot be calculated (Unknown ideal weight.). Liver Function Tests:  Recent Labs Lab 09/14/16 1025  AST 20  ALT 27  ALKPHOS 112  BILITOT 0.5  PROT 7.5  ALBUMIN 3.6   No results for input(s): LIPASE, AMYLASE in the last 168 hours. No results for input(s): AMMONIA in the last 168 hours. Coagulation Profile:  Recent Labs Lab 09/14/16 1025  INR 0.98   Cardiac Enzymes: No results for input(s): CKTOTAL, CKMB, CKMBINDEX, TROPONINI in the last 168 hours. BNP (last 3 results) No results for input(s): PROBNP in the last 8760 hours. HbA1C: No results for input(s): HGBA1C in the last 72 hours. CBG: No results for input(s): GLUCAP in the last 168 hours. Lipid Profile: No results for input(s): CHOL, HDL, LDLCALC, TRIG, CHOLHDL, LDLDIRECT in the last 72 hours. Thyroid Function Tests: No results for input(s): TSH, T4TOTAL, FREET4, T3FREE, THYROIDAB in the last 72 hours. Anemia Panel: No results for input(s): VITAMINB12, FOLATE, FERRITIN, TIBC, IRON, RETICCTPCT in the last 72 hours. Urine analysis:    Component Value Date/Time   COLORURINE STRAW (A) 09/14/2016 1049   APPEARANCEUR CLEAR 09/14/2016 1049   LABSPEC 1.009 09/14/2016 1049   PHURINE 6.0 09/14/2016 1049   GLUCOSEU NEGATIVE 09/14/2016 1049   HGBUR NEGATIVE  09/14/2016 1049   BILIRUBINUR NEGATIVE 09/14/2016 1049   KETONESUR NEGATIVE 09/14/2016 1049   PROTEINUR NEGATIVE 09/14/2016 1049   NITRITE NEGATIVE 09/14/2016 1049   LEUKOCYTESUR TRACE (A) 09/14/2016 1049    Radiological Exams on Admission: Dg Chest 2 View  Result Date: 09/14/2016 CLINICAL DATA:  Coughing congestion. EXAM: CHEST  2 VIEW COMPARISON:  06/14/2016 FINDINGS: The lungs are clear wiithout focal pneumonia, edema, pneumothorax or pleural effusion. The cardiopericardial silhouette is within normal limits for size. The visualized bony structures of the thorax are intact. Telemetry leads overlie the chest. IMPRESSION: No active cardiopulmonary disease. Electronically Signed   By: Misty Stanley M.D.   On: 09/14/2016 11:33   Ct Head Wo Contrast  Result Date: 09/14/2016 CLINICAL DATA:  Left facial and upper extremity weakness. EXAM: CT HEAD WITHOUT CONTRAST TECHNIQUE: Contiguous axial images were obtained from the base of the skull through the vertex without intravenous contrast. COMPARISON:  CT scan of June 14, 2016. FINDINGS: Brain: No evidence of acute infarction, hemorrhage, hydrocephalus, extra-axial collection or mass lesion/mass effect. Vascular: Atherosclerosis of carotid siphons is noted. Skull: Normal. Negative for fracture or focal lesion. Sinuses/Orbits: Right sphenoid sinusitis is noted. Other: None. IMPRESSION: Mild right sphenoid sinusitis. No acute intracranial abnormality seen. Electronically Signed   By: Marijo Conception, M.D.   On: 09/14/2016 11:48   Mr Brain Wo Contrast  Result Date: 09/14/2016 CLINICAL DATA:  66 y/o F; right eye vision changes, left face and left arm numbness, right face tingling, and headaches. EXAM: MRI HEAD WITHOUT CONTRAST TECHNIQUE: Multiplanar, multiecho pulse sequences of the brain and surrounding structures were obtained without intravenous contrast. COMPARISON:  09/14/2016 CT head FINDINGS: Brain: No acute infarction, hemorrhage, hydrocephalus,  extra-axial collection or mass lesion. Few nonspecific foci of T2 FLAIR hyperintensity within subcortical and periventricular white matter are compatible with mild chronic microvascular ischemic changes. Mild brain parenchymal volume loss. Vascular: Normal flow voids. Skull and upper cervical spine: Normal marrow signal. Sinuses/Orbits: Right-sided ethmoid and sphenoid sinus mucosal thickening. Trace right mastoid tip effusion. Orbits are unremarkable. Other: None. IMPRESSION: 1. No acute intracranial abnormality identified. 2. Mild chronic  microvascular ischemic changes and mild parenchymal volume loss of the brain. 3. Paranasal sinus disease and right ethmoid air cells and sphenoid sinus. Electronically Signed   By: Kristine Garbe M.D.   On: 09/14/2016 14:33    EKG: Independently reviewed. Sinus rhythm.   Assessment/Plan Active Problems:   Hyperlipidemia   RA (rheumatoid arthritis) (HCC)   Type 2 diabetes mellitus (HCC)   Hypothyroidism (acquired)   OSA (obstructive sleep apnea)   TIA (transient ischemic attack)   1-Left side face, arm numbness and tingling, right eye blurry vision;  Admit to [place], as recommended by neurology.  MRI negative for stroke.  Differential TIA, Complex migraine, vascular diseases. She has history of carotid plaque.  Will order Carotid doppler, ECHO, MRA, Hb A1-c, lipid panel.  Start aspirin and continue with pravastatin.  Neurology consulted by ED.   2-HTN; hold lisinopril, permissive HTN.   3-RA; continue with methotrexate, prednisone.  4-Asthma, copd; continue with albuterol, Symbicort,     DVT prophylaxis: Lovenox Code Status: Full code.  Family Communication: care discussed with patient  Disposition Plan: home in 24 to 48 hours.  Consults called: Ed consulted neurology  Admission status: observation, telemetry.    Elmarie Shiley MD [facility] Pager [phone]  If 7PM-7AM, please contact  night-coverage [email or web address] Password [facility]  09/14/2016, 5:08 PM

## 2016-09-14 NOTE — ED Notes (Signed)
Patient transported to X-ray 

## 2016-09-14 NOTE — ED Notes (Signed)
Pt in MRI.

## 2016-09-15 ENCOUNTER — Other Ambulatory Visit (HOSPITAL_COMMUNITY): Payer: Medicare Other

## 2016-09-15 ENCOUNTER — Observation Stay (HOSPITAL_BASED_OUTPATIENT_CLINIC_OR_DEPARTMENT_OTHER): Payer: Medicare Other

## 2016-09-15 DIAGNOSIS — R200 Anesthesia of skin: Secondary | ICD-10-CM | POA: Diagnosis not present

## 2016-09-15 DIAGNOSIS — R519 Headache, unspecified: Secondary | ICD-10-CM

## 2016-09-15 DIAGNOSIS — E785 Hyperlipidemia, unspecified: Secondary | ICD-10-CM

## 2016-09-15 DIAGNOSIS — G459 Transient cerebral ischemic attack, unspecified: Secondary | ICD-10-CM

## 2016-09-15 DIAGNOSIS — R202 Paresthesia of skin: Secondary | ICD-10-CM | POA: Diagnosis not present

## 2016-09-15 DIAGNOSIS — R51 Headache: Secondary | ICD-10-CM

## 2016-09-15 LAB — ECHOCARDIOGRAM COMPLETE
Height: 65 in
Weight: 3135.82 oz

## 2016-09-15 LAB — LIPID PANEL
Cholesterol: 189 mg/dL (ref 0–200)
HDL: 40 mg/dL — ABNORMAL LOW (ref >40)
LDL Cholesterol: 111 mg/dL — ABNORMAL HIGH (ref 0–99)
Total CHOL/HDL Ratio: 4.7 RATIO
Triglycerides: 189 mg/dL — ABNORMAL HIGH (ref <150)
VLDL: 38 mg/dL (ref 0–40)

## 2016-09-15 LAB — HIV ANTIBODY (ROUTINE TESTING W REFLEX): HIV Screen 4th Generation wRfx: NONREACTIVE

## 2016-09-15 MED ORDER — PRAVASTATIN SODIUM 20 MG PO TABS: 40.0000 mg | ORAL_TABLET | Freq: Every day | ORAL | 0 refills | Status: DC

## 2016-09-15 MED ORDER — ASPIRIN 325 MG PO TABS: 325.0000 mg | ORAL_TABLET | Freq: Every day | ORAL | 0 refills | Status: DC

## 2016-09-15 MED ORDER — ASPIRIN 325 MG PO TABS
325.0000 mg | ORAL_TABLET | Freq: Every day | ORAL | Status: DC
  Administered 2016-09-15: 325 mg via ORAL
  Filled 2016-09-15: qty 1

## 2016-09-15 NOTE — Progress Notes (Signed)
Echo result reviewed. Patient will be discharged home. Discussed with RN.

## 2016-09-15 NOTE — Progress Notes (Signed)
  Echocardiogram 2D Echocardiogram has been performed.  Brenda Cochran 09/15/2016, 3:17 PM

## 2016-09-15 NOTE — Care Management Obs Status (Signed)
[place] NOTIFICATION   Patient Details  Name: Brenda Cochran MRN: [id number] Date of Birth: February 26, 1951   Medicare Observation Status Notification Given:  Yes    Carles Collet, RN 09/15/2016, 4:15 PM

## 2016-09-15 NOTE — Plan of Care (Signed)
Discussed with Dr. Carolin Sicks about potential discharge today. Patient continue with transient symptoms concerning for TIA versus complicated migraine. Stroke workup so far negative except 2-D Echo pending report. LDL 111 and A1c pending. Patient not on antiplatelet prior to admission, will start on aspirin. Increase pravastatin from 20 to 40. From stroke standpoint, patient can be discharged after 2-D echo report resulted if unremarkable. Patient can follow up with carolyn Hassell Done NP at [facility] in 6 weeks. Neurology will sign off. Thanks for the consult.  Rosalin Hawking, MD PhD Stroke Neurology 09/15/2016 4:10 PM

## 2016-09-15 NOTE — Progress Notes (Signed)
Pt discharged home. Discharged instructions were reviewed with pt. Pt verbalized understanding.

## 2016-09-15 NOTE — Care Management Note (Signed)
Case Management Note  Patient Details  Name: Brenda Cochran MRN: [id number] Date of Birth: [birth year]  Subjective/Objective:                 Patient from home, in [place] for TIA. Independent, no PT OT SLP follow up rec. No other CM needs identified at this time.    Action/Plan:  Anticipate DC to home when work up complete.  Expected Discharge Date:                  Expected Discharge Plan:  Home/Self Care  In-House Referral:     Discharge planning Services  CM Consult  Post Acute Care Choice:    Choice offered to:     DME Arranged:    DME Agency:     HH Arranged:    HH Agency:     Status of Service:  In process, will continue to follow  If discussed at Long Length of Stay Meetings, dates discussed:    Additional Comments:  Carles Collet, RN 09/15/2016, 3:37 PM

## 2016-09-15 NOTE — Evaluation (Signed)
Speech Language Pathology Evaluation Patient Details Name: Brenda Cochran MRN: [id number] DOB: 1950-10-20 Today's Date: 09/15/2016 Time: 4081-4481 SLP Time Calculation (min) (ACUTE ONLY): 17 min  Problem List:  Patient Active Problem List   Diagnosis Date Noted  . TIA (transient ischemic attack) 09/14/2016  . Obesity (BMI 30-39.9) 01/13/2014  . OSA (obstructive sleep apnea) 01/13/2014  . Asthma, chronic obstructive, with acute exacerbation ([place])   . Osteoarthritis   . Hyperlipidemia   . Fibromyalgia   . RA (rheumatoid arthritis) ([place])   . Type 2 diabetes mellitus ([place])   . Skin cancer   . Hypothyroidism (acquired)   . GERD (gastroesophageal reflux disease)   . Depression    Past Medical History:  Past Medical History:  Diagnosis Date  . Asthma   . COPD (chronic obstructive pulmonary disease)   . Depression   . Fibromyalgia   . GERD (gastroesophageal reflux disease)   . Hyperlipidemia   . Hypothyroidism (acquired)   . Osteoarthritis   . RA (rheumatoid arthritis)    [name]  . Skin cancer    face  . Type 2 diabetes mellitus    Past Surgical History:  Past Surgical History:  Procedure Laterality Date  . ABDOMINAL HYSTERECTOMY  1984  . BREAST LUMPECTOMY  1976   left, benign  . CHOLECYSTECTOMY  09/05/10   HPI:  Ptis a 66 y.o.femalewith medical history significant of asthma, RA on methotrexate, DM, hypothyroidism, carotid artery disease who presented with left face, mouth, left arm numbness and tingling, and right eye blurry vision. MRI of brain showed no acute intracranial abnormality identified.  Mild chronic microvascular ischemic changes and mild parenchymal volume loss of the brain. CXR showed no active cardiopulmonary disease.   Assessment / Plan / Recommendation Clinical Impression  Brenda Cochran was alert and oriented X4 during the cognitive-linguistic evaluation. She reported no difficulties with speech, language, cognition, or memory. She lives with her  granddaughters, handles the household finances, is unemployed (on disability), and utilizes a calendar to keep up with important dates/appointments. The MOCA was administered to assess the pt's language and cognitive abilities; she achieved a total score of 27/30 (26 and above is normal). Only noted deifict was in the delayed recall/memory domain; she was able to recall 2/5 words independently and 3/5 words when provided with category cues. Overall, Brenda Cochran appears to be functioning WNL with regards to language and cognition. Treatment not necessary at this time; ST signing off.    SLP Assessment  SLP Recommendation/Assessment: Patient does not need any further Speech Lanaguage Pathology Services SLP Visit Diagnosis: Cognitive communication deficit (R41.841)    Follow Up Recommendations  None    Frequency and Duration           SLP Evaluation Cognition  Overall Cognitive Status: No family/caregiver present to determine baseline cognitive functioning Arousal/Alertness: Awake/alert Orientation Level: Oriented X4 Attention: Sustained Sustained Attention: Appears intact Memory: Impaired Memory Impairment: Retrieval deficit;Decreased short term memory Decreased Short Term Memory: Verbal basic Awareness: Appears intact Problem Solving: Appears intact Safety/Judgment: Appears intact       Comprehension  Auditory Comprehension Overall Auditory Comprehension: Appears within functional limits for tasks assessed Yes/No Questions: Not tested Commands: Within Functional Limits Conversation: Complex Visual Recognition/Discrimination Discrimination: Not tested Reading Comprehension Reading Status: Not tested    Expression Expression Primary Mode of Expression: Verbal Verbal Expression Overall Verbal Expression: Appears within functional limits for tasks assessed Initiation: No impairment Level of Generative/Spontaneous Verbalization: Conversation Repetition: No impairment Naming: No  impairment Pragmatics: No impairment Written Expression Dominant Hand: Right Written Expression: Not tested   Oral / Motor  Oral Motor/Sensory Function Overall Oral Motor/Sensory Function: Within functional limits Motor Speech Overall Motor Speech: Appears within functional limits for tasks assessed Respiration: Within functional limits Phonation: Normal Resonance: Within functional limits Articulation: Within functional limitis Intelligibility: Intelligible Motor Planning: Witnin functional limits Motor Speech Errors: Not applicable   GO          Functional Assessment Tool Used: skilled clinical judgement Functional Limitations: Spoken language comprehension Spoken Language Comprehension Current Status [phone]): 0 percent impaired, limited or restricted Spoken Language Comprehension Goal Status (G9160): 0 percent impaired, limited or restricted Spoken Language Comprehension Discharge Status [phone]): 0 percent impaired, limited or restricted         Fransisca Kaufmann , [place] 09/15/2016, 1:31 PM

## 2016-09-15 NOTE — Progress Notes (Signed)
PROGRESS NOTE    Brenda Cochran  HUT:[id number] DOB: [birth year] DOA: 09/14/2016 PCP: Rochel Brome, MD   Brief Narrative: 66 y.o. female with medical history significant of Asthma, RA on methotrexate, DM, Hypothyroidism, carotid artery diseases who present complaining of left face, mouth left arm numbness and tingling that started 2 days prior to admission. She is also report right eye blurry vision on the day of admission. She denies focal weakness, or slurred speech.  Assessment & Plan:  # Headache associated with right facial tingling and right eye blurry vision concerning for TIA. -The symptoms resolved today. -As per prior record, neurology was contacted on admission. I spoke with them today. - CT scan of head, MRI of the brain and MRA with no acute finding. -Pending echocardiogram and carotid Doppler ultrasound -Start aspirin -LDL 111, A1c pending.  already on statin. -PT, OT and speech/swallow evaluation -Follow-up with neurologist recommendation  #History of rheumatoid arthritis: Continue home medications including folic acid, methotrexate, prednisone  History of obstructive sleep apnea History of type 2 diabetes as per MEDICAL RECORD NUMBERPatient's blood sugar has been stable. Pending A1c level. She does not take hypoglycemic agent at home.   DVT prophylaxis: Lovenox subcutaneous Code Status: Full code Family Communication: No family at bedside Disposition Plan: Likely discharge home today or tomorrow depending on this results and neurologist evaluation and plan of care.    Consultants:   Neurology  Procedures: None Antimicrobials: None  Subjective: Patient was seen and examined at bedside. She denied headache, dizziness, nausea, vomiting, weakness.   Objective: Vitals:   09/15/16 0814 09/15/16 0830 09/15/16 0955 09/15/16 1327  BP:  (!) 155/84 (!) 162/81 (!) 155/84  Pulse:   70 77  Resp:  19 19 18   Temp:  97.5 F (36.4 C) 97.9 F (36.6 C) 98.5 F (36.9 C)    TempSrc:  Oral Oral Oral  SpO2: 96% 97% 96% 95%  Weight:      Height:        Intake/Output Summary (Last 24 hours) at 09/15/16 1342 Last data filed at 09/15/16 1111  Gross per 24 hour  Intake              120 ml  Output                0 ml  Net              120 ml   Filed Weights   09/14/16 2027  Weight: 88.9 kg (195 lb 15.8 oz)    Examination:  General exam: Appears calm and comfortable  Respiratory system: Clear to auscultation. Respiratory effort normal. No wheezing or crackle Cardiovascular system: S1 & S2 heard, RRR.  No pedal edema. Gastrointestinal system: Abdomen is nondistended, soft and nontender. Normal bowel sounds heard. Central nervous system: Alert and oriented. No focal neurological deficits. Extremities: Symmetric 5 x 5 power. Skin: No rashes, lesions or ulcers Psychiatry: Judgement and insight appear normal. Mood & affect appropriate.     Data Reviewed: I have personally reviewed following labs and imaging studies  CBC:  Recent Labs Lab 09/14/16 1025  WBC 7.8  NEUTROABS 5.7  HGB 14.7  HCT 42.6  MCV 90.3  PLT 656   Basic Metabolic Panel:  Recent Labs Lab 09/14/16 1025 09/14/16 2125  NA 141  --   K 4.3  --   CL 107  --   CO2 26  --   GLUCOSE 175*  --   BUN 17  --  CREATININE 0.76 0.62  CALCIUM 9.5  --    GFR: Estimated Creatinine Clearance: 78.3 mL/min (by C-G formula based on SCr of 0.62 mg/dL). Liver Function Tests:  Recent Labs Lab 09/14/16 1025  AST 20  ALT 27  ALKPHOS 112  BILITOT 0.5  PROT 7.5  ALBUMIN 3.6   No results for input(s): LIPASE, AMYLASE in the last 168 hours. No results for input(s): AMMONIA in the last 168 hours. Coagulation Profile:  Recent Labs Lab 09/14/16 1025  INR 0.98   Cardiac Enzymes: No results for input(s): CKTOTAL, CKMB, CKMBINDEX, TROPONINI in the last 168 hours. BNP (last 3 results) No results for input(s): PROBNP in the last 8760 hours. HbA1C: No results for input(s): HGBA1C in  the last 72 hours. CBG: No results for input(s): GLUCAP in the last 168 hours. Lipid Profile:  Recent Labs  09/15/16 0314  CHOL 189  HDL 40*  LDLCALC 111*  TRIG 189*  CHOLHDL 4.7   Thyroid Function Tests: No results for input(s): TSH, T4TOTAL, FREET4, T3FREE, THYROIDAB in the last 72 hours. Anemia Panel: No results for input(s): VITAMINB12, FOLATE, FERRITIN, TIBC, IRON, RETICCTPCT in the last 72 hours. Sepsis Labs: No results for input(s): PROCALCITON, LATICACIDVEN in the last 168 hours.  No results found for this or any previous visit (from the past 240 hour(s)).       Radiology Studies: Dg Chest 2 View  Result Date: 09/14/2016 CLINICAL DATA:  Coughing congestion. EXAM: CHEST  2 VIEW COMPARISON:  06/14/2016 FINDINGS: The lungs are clear wiithout focal pneumonia, edema, pneumothorax or pleural effusion. The cardiopericardial silhouette is within normal limits for size. The visualized bony structures of the thorax are intact. Telemetry leads overlie the chest. IMPRESSION: No active cardiopulmonary disease. Electronically Signed   By: Misty Stanley M.D.   On: 09/14/2016 11:33   Ct Head Wo Contrast  Result Date: 09/14/2016 CLINICAL DATA:  Left facial and upper extremity weakness. EXAM: CT HEAD WITHOUT CONTRAST TECHNIQUE: Contiguous axial images were obtained from the base of the skull through the vertex without intravenous contrast. COMPARISON:  CT scan of June 14, 2016. FINDINGS: Brain: No evidence of acute infarction, hemorrhage, hydrocephalus, extra-axial collection or mass lesion/mass effect. Vascular: Atherosclerosis of carotid siphons is noted. Skull: Normal. Negative for fracture or focal lesion. Sinuses/Orbits: Right sphenoid sinusitis is noted. Other: None. IMPRESSION: Mild right sphenoid sinusitis. No acute intracranial abnormality seen. Electronically Signed   By: Marijo Conception, M.D.   On: 09/14/2016 11:48   Mr Brain Wo Contrast  Result Date: 09/14/2016 CLINICAL  DATA:  66 y/o F; right eye vision changes, left face and left arm numbness, right face tingling, and headaches. EXAM: MRI HEAD WITHOUT CONTRAST TECHNIQUE: Multiplanar, multiecho pulse sequences of the brain and surrounding structures were obtained without intravenous contrast. COMPARISON:  09/14/2016 CT head FINDINGS: Brain: No acute infarction, hemorrhage, hydrocephalus, extra-axial collection or mass lesion. Few nonspecific foci of T2 FLAIR hyperintensity within subcortical and periventricular white matter are compatible with mild chronic microvascular ischemic changes. Mild brain parenchymal volume loss. Vascular: Normal flow voids. Skull and upper cervical spine: Normal marrow signal. Sinuses/Orbits: Right-sided ethmoid and sphenoid sinus mucosal thickening. Trace right mastoid tip effusion. Orbits are unremarkable. Other: None. IMPRESSION: 1. No acute intracranial abnormality identified. 2. Mild chronic microvascular ischemic changes and mild parenchymal volume loss of the brain. 3. Paranasal sinus disease and right ethmoid air cells and sphenoid sinus. Electronically Signed   By: Kristine Garbe M.D.   On: 09/14/2016 14:33  Mr Jodene Nam Head/brain FR Cm  Result Date: 09/15/2016 CLINICAL DATA:  Numbness and tingling of the left upper extremity EXAM: MRA HEAD WITHOUT CONTRAST TECHNIQUE: Angiographic images of the Circle of Willis were obtained using MRA technique without intravenous contrast. COMPARISON:  Brain MRI 09/14/2016 FINDINGS: Intracranial internal carotid arteries: Normal. Anterior cerebral arteries: Hypoplastic right A1 segment, a congenital variant. Otherwise normal. Middle cerebral arteries: Normal. Posterior communicating arteries: Present on the right. Posterior cerebral arteries: Fetal origin of the right posterior cerebral artery. Basilar artery: Normal. Vertebral arteries: Left dominant. Normal. Superior cerebellar arteries: Normal. Anterior inferior cerebellar arteries: Normal.  Posterior inferior cerebellar arteries: Normal. IMPRESSION: Normal intracranial MRA. Electronically Signed   By: Ulyses Jarred M.D.   On: 09/15/2016 02:27        Scheduled Meds: . enoxaparin (LOVENOX) injection  40 mg Subcutaneous Q24H  . folic acid  1 mg Oral Q Fri  . methotrexate  15 mg Oral Q Fri  . mometasone-formoterol  2 puff Inhalation BID  . pravastatin  20 mg Oral QHS  . predniSONE  5 mg Oral Q breakfast  . venlafaxine XR  75 mg Oral QHS   Continuous Infusions: . sodium chloride 50 mL/hr at 09/14/16 2203     LOS: 0 days    Cherene Dobbins Tanna Furry, MD [facility] Pager [phone]  If 7PM-7AM, please contact night-coverage [email or web address] Password [email or web address] 09/15/2016, 1:42 PM

## 2016-09-15 NOTE — Evaluation (Signed)
Occupational Therapy Evaluation and Discharge Patient Details Name: Brenda Cochran MRN: [id number] DOB: 1950/07/29 Today's Date: 09/15/2016    History of Present Illness 66 y.o. female with medical history significant of Asthma, RA on methotrexate, DM, Hypothyroidism, carotid artery diseases who present complaining of left face, mouth left arm numbness and tingling that started 2 days prior to admission. She is also report right eye blurry vision since early this morning. She denies focal weakness, or slurred speech.    Clinical Impression   Pt admitted with above and presents to OT with minimal residual deficits.  Pt reports mild decrease in sensation in LUE, however reports tingling has resolved.  Pt at baseline for mobility and self-care tasks, will not require any OT follow up at this time.  Pt to be discharged from OT services.    Follow Up Recommendations  No OT follow up    Equipment Recommendations  None recommended by OT    Recommendations for Other Services       Precautions / Restrictions Precautions Precautions: None      Mobility Bed Mobility Overal bed mobility: Independent                Transfers Overall transfer level: Independent                        ADL either performed or assessed with clinical judgement   ADL Overall ADL's : Independent;At baseline                                       General ADL Comments: Completed LB dressing and toilet transfers at baseline.  Pt reports her RA will slow her down and she has modified techniques to complete tasks to increase her independence.     Vision Baseline Vision/History: Wears glasses Wears Glasses: At all times Patient Visual Report: No change from baseline (blurriness has resolved) Vision Assessment?: Yes Eye Alignment: Within Functional Limits Ocular Range of Motion: Within Functional Limits Alignment/Gaze Preference: Within Defined Limits Tracking/Visual Pursuits:  Able to track stimulus in all quads without difficulty Saccades: Within functional limits Visual Fields: No apparent deficits            Pertinent Vitals/Pain Pain Assessment: No/denies pain     Hand Dominance Right   Extremity/Trunk Assessment Upper Extremity Assessment Upper Extremity Assessment: Generalized weakness (ROM WFL, loose gross grasp as pt reports increase in swelling after fluids overnight)   Lower Extremity Assessment Lower Extremity Assessment: Defer to PT evaluation;Overall WFL for tasks assessed       Communication Communication Communication: No difficulties   Cognition Arousal/Alertness: Awake/alert Behavior During Therapy: WFL for tasks assessed/performed Overall Cognitive Status: Within Functional Limits for tasks assessed                                                Home Living Family/patient expects to be discharged to:: Private residence Living Arrangements: Other relatives (grandchildren) Available Help at Discharge: Family;Available PRN/intermittently Type of Home: House Home Access: Stairs to enter [employer] of Steps: 3 Entrance Stairs-Rails: Can reach both Home Layout: One level     Bathroom Shower/Tub: Teacher, early years/pre: Standard     Home Equipment: Grab bars - tub/shower  Prior Functioning/Environment Level of Independence: Independent                 OT Problem List: Decreased strength;Increased edema      OT Treatment/Interventions:      OT Goals(Current goals can be found in the care plan section) Acute Rehab OT Goals OT Goal Formulation: All assessment and education complete, DC therapy  OT Frequency:      End of Session Nurse Communication: Mobility status  Activity Tolerance: Patient tolerated treatment well Patient left: in bed;with call bell/phone within reach  OT Visit Diagnosis: Muscle weakness (generalized) (M62.81)                Time:  4827-0786 OT Time Calculation (min): 17 min Charges:  OT General Charges $OT Visit: 1 Procedure OT Evaluation $OT Eval Low Complexity: 1 Procedure G-Codes: OT G-codes **NOT FOR INPATIENT CLASS** Functional Assessment Tool Used: AM-PAC 6 Clicks Daily Activity Functional Limitation: Self care Self Care Current Status (L5449): At least 1 percent but less than 20 percent impaired, limited or restricted Self Care Goal Status (E0100): At least 1 percent but less than 20 percent impaired, limited or restricted Self Care Discharge Status [phone]): At least 1 percent but less than 20 percent impaired, limited or restricted   Simonne Come, [phone] 09/15/2016, 9:02 AM

## 2016-09-15 NOTE — Discharge Summary (Signed)
Physician Discharge Summary  Brenda Cochran JSH:[id number] DOB: Nov 05, 1950 DOA: 09/14/2016  PCP: Rochel Brome, MD  Admit date: 09/14/2016 Discharge date: 09/15/2016  Admitted From:home Disposition:home  Recommendations for Outpatient Follow-up:  1. Follow up with PCP in 1-2 weeks 2. Please obtain BMP/CBC in one week 3. Please follow up pending A1c level with her PCP.   Home Health: No Equipment/Devices: No Discharge Condition: Stable CODE STATUS: Full code Diet recommendation: Heart healthy  Brief/Interim Summary:66 y.o.femalewith medical history significant of Asthma, RA on methotrexate, DM, Hypothyroidism, carotid artery diseases who present complaining of left face, mouth left arm numbness and tingling that started 2 days prior to admission. She is also report right eye blurry vision on the day of admission. She denies focal weakness, or slurred speech.  # Headache associated with right facial tingling and right eye blurry vision concerning for TIA versus complicated migraine. The symptoms are resolved. Patient denies any headache, dizziness, numbness or weakness. Denies blurry vision. She is able to ablate without difficulties. - CT scan of head, MRI of the brain and MRA with no acute finding. -Preliminary result from carotid Doppler unremarkable. Echocardiogram result pending. The echo was done and the results will be reviewed before patient is discharged from the hospital. I discussed with the neurologist Dr. Erlinda Hong recommended to continue aspirin and increase the dose of statin to 40 mg. I also discussed with Dr. Orlena Sheldon who agreed with the plan and discharging home today. Patient reported that she has waiting tomorrow in her family and really wanted to go home.  Patient is clinically improved. Recommended outpatient follow-up with neurologist. Ambulatory referral was made. Also recommended to follow-up with PCP. The echo results will be reviewed before patient is discharged from the  hospital.  #History of rheumatoid arthritis: Continue home medications including folic acid, methotrexate, prednisone  Patient was continued on home medication on discharge.  Discharge Diagnoses:  Active Problems:   Hyperlipidemia   RA (rheumatoid arthritis) (HCC)   Type 2 diabetes mellitus (HCC)   Hypothyroidism (acquired)   OSA (obstructive sleep apnea)   TIA (transient ischemic attack)   Acute nonintractable headache   Numbness and tingling in left upper extremity    Discharge Instructions  Discharge Instructions    Ambulatory referral to Neurology    Complete by:  As directed    An appointment is requested in approximately: 4 weeks   Call MD for:  difficulty breathing, headache or visual disturbances    Complete by:  As directed    Call MD for:  extreme fatigue    Complete by:  As directed    Call MD for:  hives    Complete by:  As directed    Call MD for:  persistant dizziness or light-headedness    Complete by:  As directed    Call MD for:  persistant nausea and vomiting    Complete by:  As directed    Call MD for:  severe uncontrolled pain    Complete by:  As directed    Call MD for:  temperature >100.4    Complete by:  As directed    Diet - low sodium heart healthy    Complete by:  As directed    Discharge instructions    Complete by:  As directed    Follow up hba1c level with your PCP   Increase activity slowly    Complete by:  As directed      Allergies as of 09/15/2016  Reactions   Shellfish Allergy Anaphylaxis   Contrast Media [iodinated Diagnostic Agents] Rash   Metformin And Related Nausea And Vomiting   Cymbalta [duloxetine Hcl] Nausea And Vomiting   Ultram [tramadol] Nausea And Vomiting   Oxycontin [oxycodone Hcl] Palpitations      Medication List    TAKE these medications   albuterol 108 (90 Base) MCG/ACT inhaler Commonly known as:  PROVENTIL HFA;VENTOLIN HFA Inhale 2 puffs into the lungs every 4 (four) hours as needed for wheezing  or shortness of breath.   aspirin 325 MG tablet Take 1 tablet (325 mg total) by mouth daily.   bismuth subsalicylate 762 UQ/33HL suspension Commonly known as:  PEPTO BISMOL Take 30 mLs by mouth every 6 (six) hours as needed (for nausea/vomiting).   etodolac 400 MG tablet Commonly known as:  LODINE Take 400 mg by mouth 2 (two) times daily after a meal.   folic acid 1 MG tablet Commonly known as:  FOLVITE Take 1 mg by mouth every Friday.   lisinopril 5 MG tablet Commonly known as:  PRINIVIL,ZESTRIL Take 5 mg by mouth daily.   methotrexate 2.5 MG tablet Commonly known as:  RHEUMATREX Take 15 mg by mouth every Friday. Caution:Chemotherapy. Protect from light.   pravastatin 20 MG tablet Commonly known as:  PRAVACHOL Take 2 tablets (40 mg total) by mouth at bedtime. What changed:  how much to take   predniSONE 5 MG tablet Commonly known as:  DELTASONE Take 5 mg by mouth daily with breakfast.   SYMBICORT 160-4.5 MCG/ACT inhaler Generic drug:  budesonide-formoterol Inhale 1 puff into the lungs 2 (two) times daily.   tiZANidine 4 MG tablet Commonly known as:  ZANAFLEX Take 4 mg by mouth at bedtime as needed for muscle spasms.   venlafaxine XR 75 MG 24 hr capsule Commonly known as:  EFFEXOR-XR Take 75 mg by mouth at bedtime.      Follow-up Information    COX,KIRSTEN, MD. Schedule an appointment as soon as possible for a visit in 1 week(s).   Specialties:  Family Medicine, Interventional Cardiology, Radiology, Anesthesiology Contact information: [place] [address] [place], NP. Schedule an appointment as soon as possible for a visit in 6 week(s).   Specialty:  Family Medicine Contact information: [address] [place] [address] [phone]          Allergies  Allergen Reactions  . Shellfish Allergy Anaphylaxis  . Contrast Media [Iodinated Diagnostic Agents] Rash  . Metformin And  Related Nausea And Vomiting  . Cymbalta [Duloxetine Hcl] Nausea And Vomiting  . Ultram [Tramadol] Nausea And Vomiting  . Oxycontin [Oxycodone Hcl] Palpitations    Consultations: Neurologist  Procedures/Studies: CT scan, MRI, echo, carotid Doppler  Subjective: Patient was seen and examined at bedside. She reported doing good. Eager to go home today. Denied headache, dizziness, tingling or numbness sensation. Denies blurry vision. No nausea or vomiting. No chest pain or shortness of breath. Able to ablate without difficulties.  Discharge Exam: Vitals:   09/15/16 0955 09/15/16 1327  BP: (!) 162/81 (!) 155/84  Pulse: 70 77  Resp: 19 18  Temp: 97.9 F (36.6 C) 98.5 F (36.9 C)   Vitals:   09/15/16 0814 09/15/16 0830 09/15/16 0955 09/15/16 1327  BP:  (!) 155/84 (!) 162/81 (!) 155/84  Pulse:   70 77  Resp:  19 19 18   Temp:  97.5 F (36.4 C) 97.9 F (36.6 C)  98.5 F (36.9 C)  TempSrc:  Oral Oral Oral  SpO2: 96% 97% 96% 95%  Weight:      Height:        General: Pt is alert, awake, not in acute distress Cardiovascular: RRR, S1/S2 +, no rubs, no gallops Respiratory: CTA bilaterally, no wheezing, no rhonchi Abdominal: Soft, NT, ND, bowel sounds + Extremities: no edema, no cyanosis    The results of significant diagnostics from this hospitalization (including imaging, microbiology, ancillary and laboratory) are listed below for reference.     Microbiology: No results found for this or any previous visit (from the past 240 hour(s)).   Labs: BNP (last 3 results) No results for input(s): BNP in the last 8760 hours. Basic Metabolic Panel:  Recent Labs Lab 09/14/16 1025 09/14/16 2125  NA 141  --   K 4.3  --   CL 107  --   CO2 26  --   GLUCOSE 175*  --   BUN 17  --   CREATININE 0.76 0.62  CALCIUM 9.5  --    Liver Function Tests:  Recent Labs Lab 09/14/16 1025  AST 20  ALT 27  ALKPHOS 112  BILITOT 0.5  PROT 7.5  ALBUMIN 3.6   No results for input(s):  LIPASE, AMYLASE in the last 168 hours. No results for input(s): AMMONIA in the last 168 hours. CBC:  Recent Labs Lab 09/14/16 1025  WBC 7.8  NEUTROABS 5.7  HGB 14.7  HCT 42.6  MCV 90.3  PLT 277   Cardiac Enzymes: No results for input(s): CKTOTAL, CKMB, CKMBINDEX, TROPONINI in the last 168 hours. BNP: Invalid input(s): POCBNP CBG: No results for input(s): GLUCAP in the last 168 hours. D-Dimer No results for input(s): DDIMER in the last 72 hours. Hgb A1c No results for input(s): HGBA1C in the last 72 hours. Lipid Profile  Recent Labs  09/15/16 0314  CHOL 189  HDL 40*  LDLCALC 111*  TRIG 189*  CHOLHDL 4.7   Thyroid function studies No results for input(s): TSH, T4TOTAL, T3FREE, THYROIDAB in the last 72 hours.  Invalid input(s): FREET3 Anemia work up No results for input(s): VITAMINB12, FOLATE, FERRITIN, TIBC, IRON, RETICCTPCT in the last 72 hours. Urinalysis    Component Value Date/Time   COLORURINE STRAW (A) 09/14/2016 1049   APPEARANCEUR CLEAR 09/14/2016 1049   LABSPEC 1.009 09/14/2016 1049   PHURINE 6.0 09/14/2016 1049   GLUCOSEU NEGATIVE 09/14/2016 1049   HGBUR NEGATIVE 09/14/2016 1049   BILIRUBINUR NEGATIVE 09/14/2016 1049   KETONESUR NEGATIVE 09/14/2016 1049   PROTEINUR NEGATIVE 09/14/2016 1049   NITRITE NEGATIVE 09/14/2016 1049   LEUKOCYTESUR TRACE (A) 09/14/2016 1049   Sepsis Labs Invalid input(s): PROCALCITONIN,  WBC,  LACTICIDVEN Microbiology No results found for this or any previous visit (from the past 240 hour(s)).   Time coordinating discharge: 35 minutes  SIGNED:   Rosita Fire, MD  [facility] 09/15/2016, 4:15 PM  If 7PM-7AM, please contact night-coverage [email or web address] Password [email or web address]

## 2016-09-15 NOTE — Progress Notes (Signed)
PT Cancellation Note/Discharge  Patient Details Name: Brenda Cochran MRN: [id number] DOB: 13-Sep-1950   Cancelled Treatment:    Reason Eval/Treat Not Completed: PT screened, no needs identified, will sign off.  Pt is ambulating around room independently, getting herself dressed, and reaching high up on a shelf.  Pt reports she feels back to her baseline and has RA, so, "don't ask me to get up off of the floor, but I can do everything else".  PT to sign off as she has no acute or f/u PT needs at this time.    Thanks,    Barbarann Ehlers. [place], [place], DPT [phone]   09/15/2016, 10:53 AM

## 2016-09-15 NOTE — Progress Notes (Signed)
*  PRELIMINARY RESULTS* Vascular Ultrasound Carotid Duplex (Doppler) has been completed.  Preliminary findings: Findings consistent with a 1-39 percent stenosis involving the right internal carotid artery and the left internal carotid artery. Mild heterogeneous irregular wall plaque seen throughout carotid system bilaterally.  Bilateral vertebral arteries are patent and antegrade.   Everrett Coombe 09/15/2016, 2:36 PM

## 2016-09-15 NOTE — Consult Note (Signed)
Requesting Physician: Dr. Carolin Sicks    Chief Complaint: HA, right facial tingling  History obtained from:  Patient    HPI:                                                                                                                                         Brenda Cochran is an 66 y.o. female  with a past medical history significant for asthma, COPD, diabetes, and GERD who presents with multiple complaints including a 2 day history of right facial tingling, and onset of left face numbness, left arm pain, left arm numbness, right eye vision blurriness, and disorientation. Patient says that one week ago, she had headache on the right side of her face. She said she had some right facial tingling at that time. She said that 2 days ago, her right facial tingling returned and has been persistent. She reports that she has had intermittent headaches on that side as well.   Neurology was spoken to by the ED but never consulted.  Today consult was called for this issue.   Of interest. Patient states that she does have a history of migraine headaches in the past. Patient states that she did have some tingling in her arm and some blurred vision before she had a throbbing headache that was located over the right eye.  She also states she's been under a significant amount of stress with her family as her daughter has recently overdosed on heroin, her husband recently got out of jail, and she is taking care of the kids.  Date last known well: Unable to determine Time last known well: Unable to determine tPA Given: No: out of window   Past Medical History:  Diagnosis Date  . Asthma   . COPD (chronic obstructive pulmonary disease)   . Depression   . Fibromyalgia   . GERD (gastroesophageal reflux disease)   . Hyperlipidemia   . Hypothyroidism (acquired)   . Osteoarthritis   . RA (rheumatoid arthritis)    [name]  . Skin cancer    face  . Type 2 diabetes mellitus     Past Surgical History:   Procedure Laterality Date  . ABDOMINAL HYSTERECTOMY  1984  . BREAST LUMPECTOMY  1976   left, benign  . CHOLECYSTECTOMY  09/05/10    Family History  Problem Relation Age of Onset  . Breast cancer Sister   . Lymphoma Sister   . Multiple sclerosis Brother   . Muscular dystrophy Brother   . CAD    . Diabetes    . Hypertension    . Seizures    . Lung disease Mother     never smoker  . Lung disease Maternal Uncle   . Lung disease Maternal Uncle   . Lung cancer Father   . Bone cancer Father    Social History:  reports that she quit smoking about 6  years ago. Her smoking use included Cigarettes. She has a 90.00 pack-year smoking history. She does not have any smokeless tobacco history on file. She reports that she does not drink alcohol or use drugs.  Allergies:  Allergies  Allergen Reactions  . Shellfish Allergy Anaphylaxis  . Contrast Media [Iodinated Diagnostic Agents] Rash  . Metformin And Related Nausea And Vomiting  . Cymbalta [Duloxetine Hcl] Nausea And Vomiting  . Ultram [Tramadol] Nausea And Vomiting  . Oxycontin [Oxycodone Hcl] Palpitations    Medications:                                                                                                                           Prior to Admission:  Prescriptions Prior to Admission  Medication Sig Dispense Refill Last Dose  . albuterol (PROVENTIL HFA;VENTOLIN HFA) 108 (90 Base) MCG/ACT inhaler Inhale 2 puffs into the lungs every 4 (four) hours as needed for wheezing or shortness of breath.   Past Week at Unknown time  . bismuth subsalicylate (PEPTO BISMOL) 262 MG/15ML suspension Take 30 mLs by mouth every 6 (six) hours as needed (for nausea/vomiting).   09/13/2016 at Unknown time  . budesonide-formoterol (SYMBICORT) 160-4.5 MCG/ACT inhaler Inhale 1 puff into the lungs 2 (two) times daily.    Past Month at Unknown time  . etodolac (LODINE) 400 MG tablet Take 400 mg by mouth 2 (two) times daily after a meal.   09/14/2016 at  0700  . folic acid (FOLVITE) 1 MG tablet Take 1 mg by mouth every Friday.    09/08/2016 at unknown  . lisinopril (PRINIVIL,ZESTRIL) 5 MG tablet Take 5 mg by mouth daily.   Past Month at Unknown time  . methotrexate (RHEUMATREX) 2.5 MG tablet Take 15 mg by mouth every Friday. Caution:Chemotherapy. Protect from light.    09/08/2016 at unknown  . pravastatin (PRAVACHOL) 20 MG tablet Take 20 mg by mouth at bedtime.   Past Month at Unknown time  . predniSONE (DELTASONE) 5 MG tablet Take 5 mg by mouth daily with breakfast.   09/14/2016 at Unknown time  . tiZANidine (ZANAFLEX) 4 MG tablet Take 4 mg by mouth at bedtime as needed for muscle spasms.    Past Month at Unknown time  . venlafaxine XR (EFFEXOR-XR) 75 MG 24 hr capsule Take 75 mg by mouth at bedtime.    09/13/2016 at Unknown time   Scheduled: . enoxaparin (LOVENOX) injection  40 mg Subcutaneous Q24H  . folic acid  1 mg Oral Q Fri  . methotrexate  15 mg Oral Q Fri  . mometasone-formoterol  2 puff Inhalation BID  . pravastatin  20 mg Oral QHS  . predniSONE  5 mg Oral Q breakfast  . venlafaxine XR  75 mg Oral QHS    ROS:  History obtained from the patient  General ROS: negative for - chills, fatigue, fever, night sweats, weight gain or weight loss Psychological ROS: negative for - behavioral disorder, hallucinations, memory difficulties, mood swings or suicidal ideation Ophthalmic ROS: negative for - blurry vision, double vision, eye pain or loss of vision ENT ROS: negative for - epistaxis, nasal discharge, oral lesions, sore throat, tinnitus or vertigo Allergy and Immunology ROS: negative for - hives or itchy/watery eyes Hematological and Lymphatic ROS: negative for - bleeding problems, bruising or swollen lymph nodes Endocrine ROS: negative for - galactorrhea, hair pattern changes, polydipsia/polyuria or  temperature intolerance Respiratory ROS: negative for - cough, hemoptysis, shortness of breath or wheezing Cardiovascular ROS: negative for - chest pain, dyspnea on exertion, edema or irregular heartbeat Gastrointestinal ROS: negative for - abdominal pain, diarrhea, hematemesis, nausea/vomiting or stool incontinence Genito-Urinary ROS: negative for - dysuria, hematuria, incontinence or urinary frequency/urgency Musculoskeletal ROS: negative for - joint swelling or muscular weakness Neurological ROS: as noted in HPI Dermatological ROS: negative for rash and skin lesion changes  Neurologic Examination:                                                                                                      Blood pressure (!) 162/81, pulse 70, temperature 97.9 F (36.6 C), temperature source Oral, resp. rate 19, height 5\' 5"  (1.651 m), weight 88.9 kg (195 lb 15.8 oz), SpO2 96 %.  HEENT-  Normocephalic, no lesions, without obvious abnormality.  Normal external eye and conjunctiva.  Normal TM's bilaterally.  Normal auditory canals and external ears. Normal external nose, mucus membranes and septum.  Normal pharynx. Cardiovascular- S1, S2 normal, pulses palpable throughout   Lungs- chest clear, no wheezing, rales, normal symmetric air entry Abdomen- normal findings: bowel sounds normal Extremities- no edema Lymph-no adenopathy palpable Musculoskeletal-no joint tenderness, deformity or swelling Skin-warm and dry, no hyperpigmentation, vitiligo, or suspicious lesions  Neurological Examination Mental Status: Alert, oriented, thought content appropriate.  Speech fluent without evidence of aphasia.  Able to follow 3 step commands without difficulty. Cranial Nerves: II: Discs flat bilaterally; Visual fields grossly normal,  III,IV, VI: ptosis not present, extra-ocular motions intact bilaterally, pupils equal, round, reactive to light and accommodation V,VII: smile symmetric, facial light touch sensation  normal bilaterally VIII: hearing normal bilaterally IX,X: uvula rises symmetrically XI: bilateral shoulder shrug XII: midline tongue extension Motor: Right : Upper extremity   5/5    Left:     Upper extremity   5/5  Lower extremity   5/5     Lower extremity   5/5 Tone and bulk:normal tone throughout; no atrophy noted Sensory: Pinprick and light touch intact throughout, bilaterally Deep Tendon Reflexes: 2+ and symmetric throughout Plantars: Right: downgoing   Left: downgoing Cerebellar: normal finger-to-nose, normal rapid alternating movements and normal heel-to-shin test Gait: normal gait and station       Lab Results: Basic Metabolic Panel:  Recent Labs Lab 09/14/16 1025 09/14/16 2125  NA 141  --   K 4.3  --   CL 107  --   CO2 26  --  GLUCOSE 175*  --   BUN 17  --   CREATININE 0.76 0.62  CALCIUM 9.5  --     Liver Function Tests:  Recent Labs Lab 09/14/16 1025  AST 20  ALT 27  ALKPHOS 112  BILITOT 0.5  PROT 7.5  ALBUMIN 3.6   No results for input(s): LIPASE, AMYLASE in the last 168 hours. No results for input(s): AMMONIA in the last 168 hours.  CBC:  Recent Labs Lab 09/14/16 1025  WBC 7.8  NEUTROABS 5.7  HGB 14.7  HCT 42.6  MCV 90.3  PLT 277    Cardiac Enzymes: No results for input(s): CKTOTAL, CKMB, CKMBINDEX, TROPONINI in the last 168 hours.  Lipid Panel:  Recent Labs Lab 09/15/16 0314  CHOL 189  TRIG 189*  HDL 40*  CHOLHDL 4.7  VLDL 38  LDLCALC 111*    CBG: No results for input(s): GLUCAP in the last 168 hours.  Microbiology: No results found for this or any previous visit.  Coagulation Studies:  Recent Labs  09/14/16 1025  LABPROT 12.9  INR 0.98    Imaging: Dg Chest 2 View  Result Date: 09/14/2016 CLINICAL DATA:  Coughing congestion. EXAM: CHEST  2 VIEW COMPARISON:  06/14/2016 FINDINGS: The lungs are clear wiithout focal pneumonia, edema, pneumothorax or pleural effusion. The cardiopericardial silhouette is  within normal limits for size. The visualized bony structures of the thorax are intact. Telemetry leads overlie the chest. IMPRESSION: No active cardiopulmonary disease. Electronically Signed   By: Misty Stanley M.D.   On: 09/14/2016 11:33   Ct Head Wo Contrast  Result Date: 09/14/2016 CLINICAL DATA:  Left facial and upper extremity weakness. EXAM: CT HEAD WITHOUT CONTRAST TECHNIQUE: Contiguous axial images were obtained from the base of the skull through the vertex without intravenous contrast. COMPARISON:  CT scan of June 14, 2016. FINDINGS: Brain: No evidence of acute infarction, hemorrhage, hydrocephalus, extra-axial collection or mass lesion/mass effect. Vascular: Atherosclerosis of carotid siphons is noted. Skull: Normal. Negative for fracture or focal lesion. Sinuses/Orbits: Right sphenoid sinusitis is noted. Other: None. IMPRESSION: Mild right sphenoid sinusitis. No acute intracranial abnormality seen. Electronically Signed   By: Marijo Conception, M.D.   On: 09/14/2016 11:48   Mr Brain Wo Contrast  Result Date: 09/14/2016 CLINICAL DATA:  66 y/o F; right eye vision changes, left face and left arm numbness, right face tingling, and headaches. EXAM: MRI HEAD WITHOUT CONTRAST TECHNIQUE: Multiplanar, multiecho pulse sequences of the brain and surrounding structures were obtained without intravenous contrast. COMPARISON:  09/14/2016 CT head FINDINGS: Brain: No acute infarction, hemorrhage, hydrocephalus, extra-axial collection or mass lesion. Few nonspecific foci of T2 FLAIR hyperintensity within subcortical and periventricular white matter are compatible with mild chronic microvascular ischemic changes. Mild brain parenchymal volume loss. Vascular: Normal flow voids. Skull and upper cervical spine: Normal marrow signal. Sinuses/Orbits: Right-sided ethmoid and sphenoid sinus mucosal thickening. Trace right mastoid tip effusion. Orbits are unremarkable. Other: None. IMPRESSION: 1. No acute intracranial  abnormality identified. 2. Mild chronic microvascular ischemic changes and mild parenchymal volume loss of the brain. 3. Paranasal sinus disease and right ethmoid air cells and sphenoid sinus. Electronically Signed   By: Kristine Garbe M.D.   On: 09/14/2016 14:33   Mr Jodene Nam Head/brain EX Cm  Result Date: 09/15/2016 CLINICAL DATA:  Numbness and tingling of the left upper extremity EXAM: MRA HEAD WITHOUT CONTRAST TECHNIQUE: Angiographic images of the Circle of Willis were obtained using MRA technique without intravenous contrast. COMPARISON:  Brain MRI 09/14/2016  FINDINGS: Intracranial internal carotid arteries: Normal. Anterior cerebral arteries: Hypoplastic right A1 segment, a congenital variant. Otherwise normal. Middle cerebral arteries: Normal. Posterior communicating arteries: Present on the right. Posterior cerebral arteries: Fetal origin of the right posterior cerebral artery. Basilar artery: Normal. Vertebral arteries: Left dominant. Normal. Superior cerebellar arteries: Normal. Anterior inferior cerebellar arteries: Normal. Posterior inferior cerebellar arteries: Normal. IMPRESSION: Normal intracranial MRA. Electronically Signed   By: Ulyses Jarred M.D.   On: 09/15/2016 02:27       Assessment and plan discussed with with attending physician and they are in agreement.    Etta Quill PA-C [facility] [phone]  09/15/2016, 10:11 AM   Assessment: 66 y.o. female presenting with headache associated with right facial tingling. At this time cannot rule out TIA as MRI was negative but must also consider complicated migraine as she did have a history of migraines and patient had neurological symptoms followed by throbbing headache. Currently she is back to her baseline  Stroke Risk Factors - diabetes mellitus and hyperlipidemia  Recommend: 1. HgbA1c Pending, fasting lipid panel --LDL 11--needs to be <70 3. PT consult, OT consult, Speech consult 4. Echocardiogram 5.  Carotid dopplers 6. Prophylactic therapy-Antiplatelet med: Aspirin - dose 325 mg dilay 7. Risk factor modification 8. Telemetry monitoring 9. Frequent neuro checks 10 NPO until passes stroke swallow screen 11 please page stroke NP  Or  PA  Or MD from 8am -4 pm  as this patient from this time will be  followed by the stroke.   You can look them up on [email or web address]  Password [email or web address]

## 2016-09-16 LAB — HEMOGLOBIN A1C
Hgb A1c MFr Bld: 7 % — ABNORMAL HIGH (ref 4.8–5.6)
Mean Plasma Glucose: 154 mg/dL

## 2016-09-17 LAB — VAS US CAROTID
LEFT ECA DIAS: -12 cm/s
LEFT VERTEBRAL DIAS: -23 cm/s
Left CCA dist dias: -29 cm/s
Left CCA dist sys: -78 cm/s
Left CCA prox dias: 27 cm/s
Left CCA prox sys: 115 cm/s
Left ICA dist dias: -29 cm/s
Left ICA dist sys: -77 cm/s
Left ICA prox dias: -26 cm/s
Left ICA prox sys: -67 cm/s
RIGHT ECA DIAS: -17 cm/s
RIGHT VERTEBRAL DIAS: -16 cm/s
Right CCA prox dias: -24 cm/s
Right CCA prox sys: -93 cm/s
Right cca dist sys: -62 cm/s

## 2016-11-06 ENCOUNTER — Telehealth: Payer: Self-pay

## 2016-11-06 ENCOUNTER — Ambulatory Visit: Payer: Self-pay | Admitting: Neurology

## 2016-11-06 DIAGNOSIS — J018 Other acute sinusitis: Secondary | ICD-10-CM | POA: Diagnosis not present

## 2016-11-06 DIAGNOSIS — J029 Acute pharyngitis, unspecified: Secondary | ICD-10-CM | POA: Diagnosis not present

## 2016-11-06 NOTE — Telephone Encounter (Signed)
Pt no-showed her new pt appt this morning. 

## 2016-11-13 ENCOUNTER — Encounter: Payer: Self-pay | Admitting: Neurology

## 2016-11-28 DIAGNOSIS — M150 Primary generalized (osteo)arthritis: Secondary | ICD-10-CM | POA: Diagnosis not present

## 2016-11-28 DIAGNOSIS — E669 Obesity, unspecified: Secondary | ICD-10-CM | POA: Diagnosis not present

## 2016-11-28 DIAGNOSIS — M0579 Rheumatoid arthritis with rheumatoid factor of multiple sites without organ or systems involvement: Secondary | ICD-10-CM | POA: Diagnosis not present

## 2016-11-28 DIAGNOSIS — R5382 Chronic fatigue, unspecified: Secondary | ICD-10-CM | POA: Diagnosis not present

## 2016-11-28 DIAGNOSIS — Z6832 Body mass index (BMI) 32.0-32.9, adult: Secondary | ICD-10-CM | POA: Diagnosis not present

## 2016-12-01 DIAGNOSIS — M0589 Other rheumatoid arthritis with rheumatoid factor of multiple sites: Secondary | ICD-10-CM | POA: Diagnosis not present

## 2016-12-01 DIAGNOSIS — I10 Essential (primary) hypertension: Secondary | ICD-10-CM | POA: Diagnosis not present

## 2016-12-01 DIAGNOSIS — F5102 Adjustment insomnia: Secondary | ICD-10-CM | POA: Diagnosis not present

## 2016-12-01 DIAGNOSIS — R10811 Right upper quadrant abdominal tenderness: Secondary | ICD-10-CM | POA: Diagnosis not present

## 2016-12-01 DIAGNOSIS — E1121 Type 2 diabetes mellitus with diabetic nephropathy: Secondary | ICD-10-CM | POA: Diagnosis not present

## 2016-12-01 DIAGNOSIS — E119 Type 2 diabetes mellitus without complications: Secondary | ICD-10-CM | POA: Diagnosis not present

## 2016-12-01 DIAGNOSIS — E782 Mixed hyperlipidemia: Secondary | ICD-10-CM | POA: Diagnosis not present

## 2016-12-13 DIAGNOSIS — R10811 Right upper quadrant abdominal tenderness: Secondary | ICD-10-CM | POA: Diagnosis not present

## 2016-12-13 DIAGNOSIS — R1011 Right upper quadrant pain: Secondary | ICD-10-CM | POA: Diagnosis not present

## 2016-12-24 DIAGNOSIS — R51 Headache: Secondary | ICD-10-CM | POA: Diagnosis not present

## 2017-02-13 DIAGNOSIS — R197 Diarrhea, unspecified: Secondary | ICD-10-CM | POA: Diagnosis not present

## 2017-02-13 DIAGNOSIS — R1011 Right upper quadrant pain: Secondary | ICD-10-CM | POA: Diagnosis not present

## 2017-02-15 DIAGNOSIS — A09 Infectious gastroenteritis and colitis, unspecified: Secondary | ICD-10-CM | POA: Diagnosis not present

## 2017-03-09 DIAGNOSIS — E1165 Type 2 diabetes mellitus with hyperglycemia: Secondary | ICD-10-CM | POA: Diagnosis not present

## 2017-03-09 DIAGNOSIS — J018 Other acute sinusitis: Secondary | ICD-10-CM | POA: Diagnosis not present

## 2017-03-09 DIAGNOSIS — M0589 Other rheumatoid arthritis with rheumatoid factor of multiple sites: Secondary | ICD-10-CM | POA: Diagnosis not present

## 2017-03-09 DIAGNOSIS — E1121 Type 2 diabetes mellitus with diabetic nephropathy: Secondary | ICD-10-CM | POA: Diagnosis not present

## 2017-03-09 DIAGNOSIS — E782 Mixed hyperlipidemia: Secondary | ICD-10-CM | POA: Diagnosis not present

## 2017-03-19 DIAGNOSIS — M25561 Pain in right knee: Secondary | ICD-10-CM | POA: Diagnosis not present

## 2017-03-26 DIAGNOSIS — G4730 Sleep apnea, unspecified: Secondary | ICD-10-CM | POA: Diagnosis not present

## 2017-03-26 DIAGNOSIS — I5030 Unspecified diastolic (congestive) heart failure: Secondary | ICD-10-CM | POA: Diagnosis not present

## 2017-03-26 DIAGNOSIS — M069 Rheumatoid arthritis, unspecified: Secondary | ICD-10-CM | POA: Diagnosis not present

## 2017-03-26 DIAGNOSIS — K219 Gastro-esophageal reflux disease without esophagitis: Secondary | ICD-10-CM | POA: Diagnosis not present

## 2017-03-26 DIAGNOSIS — K2970 Gastritis, unspecified, without bleeding: Secondary | ICD-10-CM | POA: Diagnosis not present

## 2017-03-26 DIAGNOSIS — E119 Type 2 diabetes mellitus without complications: Secondary | ICD-10-CM | POA: Diagnosis not present

## 2017-03-26 DIAGNOSIS — E559 Vitamin D deficiency, unspecified: Secondary | ICD-10-CM | POA: Diagnosis not present

## 2017-03-26 DIAGNOSIS — K2900 Acute gastritis without bleeding: Secondary | ICD-10-CM | POA: Diagnosis not present

## 2017-03-26 DIAGNOSIS — E039 Hypothyroidism, unspecified: Secondary | ICD-10-CM | POA: Diagnosis not present

## 2017-03-26 DIAGNOSIS — R197 Diarrhea, unspecified: Secondary | ICD-10-CM | POA: Diagnosis not present

## 2017-03-26 DIAGNOSIS — E7800 Pure hypercholesterolemia, unspecified: Secondary | ICD-10-CM | POA: Diagnosis not present

## 2017-03-26 DIAGNOSIS — Z800 Family history of malignant neoplasm of digestive organs: Secondary | ICD-10-CM | POA: Diagnosis not present

## 2017-03-26 DIAGNOSIS — R1011 Right upper quadrant pain: Secondary | ICD-10-CM | POA: Diagnosis not present

## 2017-03-26 DIAGNOSIS — R198 Other specified symptoms and signs involving the digestive system and abdomen: Secondary | ICD-10-CM | POA: Diagnosis not present

## 2017-03-26 DIAGNOSIS — J449 Chronic obstructive pulmonary disease, unspecified: Secondary | ICD-10-CM | POA: Diagnosis not present

## 2017-03-26 DIAGNOSIS — M797 Fibromyalgia: Secondary | ICD-10-CM | POA: Diagnosis not present

## 2017-03-26 DIAGNOSIS — F418 Other specified anxiety disorders: Secondary | ICD-10-CM | POA: Diagnosis not present

## 2017-03-26 DIAGNOSIS — K3189 Other diseases of stomach and duodenum: Secondary | ICD-10-CM | POA: Diagnosis not present

## 2017-03-26 DIAGNOSIS — I10 Essential (primary) hypertension: Secondary | ICD-10-CM | POA: Diagnosis not present

## 2017-03-26 HISTORY — PX: ESOPHAGOGASTRODUODENOSCOPY: SHX1529

## 2017-04-13 DIAGNOSIS — M25561 Pain in right knee: Secondary | ICD-10-CM | POA: Diagnosis not present

## 2017-04-13 DIAGNOSIS — Z23 Encounter for immunization: Secondary | ICD-10-CM | POA: Diagnosis not present

## 2017-04-23 DIAGNOSIS — M179 Osteoarthritis of knee, unspecified: Secondary | ICD-10-CM | POA: Diagnosis not present

## 2017-04-23 DIAGNOSIS — M25561 Pain in right knee: Secondary | ICD-10-CM | POA: Diagnosis not present

## 2017-05-03 DIAGNOSIS — M1711 Unilateral primary osteoarthritis, right knee: Secondary | ICD-10-CM | POA: Diagnosis not present

## 2017-05-03 DIAGNOSIS — S83241A Other tear of medial meniscus, current injury, right knee, initial encounter: Secondary | ICD-10-CM | POA: Diagnosis not present

## 2017-05-17 DIAGNOSIS — M1711 Unilateral primary osteoarthritis, right knee: Secondary | ICD-10-CM | POA: Diagnosis not present

## 2017-05-17 DIAGNOSIS — S83241D Other tear of medial meniscus, current injury, right knee, subsequent encounter: Secondary | ICD-10-CM | POA: Diagnosis not present

## 2017-05-30 DIAGNOSIS — E782 Mixed hyperlipidemia: Secondary | ICD-10-CM | POA: Diagnosis not present

## 2017-05-30 DIAGNOSIS — M0579 Rheumatoid arthritis with rheumatoid factor of multiple sites without organ or systems involvement: Secondary | ICD-10-CM | POA: Diagnosis not present

## 2017-05-30 DIAGNOSIS — F17210 Nicotine dependence, cigarettes, uncomplicated: Secondary | ICD-10-CM | POA: Diagnosis not present

## 2017-05-30 DIAGNOSIS — E1121 Type 2 diabetes mellitus with diabetic nephropathy: Secondary | ICD-10-CM | POA: Diagnosis not present

## 2017-05-30 DIAGNOSIS — Z01810 Encounter for preprocedural cardiovascular examination: Secondary | ICD-10-CM | POA: Diagnosis not present

## 2017-05-30 DIAGNOSIS — G4733 Obstructive sleep apnea (adult) (pediatric): Secondary | ICD-10-CM | POA: Diagnosis not present

## 2017-05-30 DIAGNOSIS — N3000 Acute cystitis without hematuria: Secondary | ICD-10-CM | POA: Diagnosis not present

## 2017-05-30 DIAGNOSIS — J410 Simple chronic bronchitis: Secondary | ICD-10-CM | POA: Diagnosis not present

## 2017-06-08 DIAGNOSIS — R05 Cough: Secondary | ICD-10-CM | POA: Diagnosis not present

## 2017-06-12 DIAGNOSIS — E038 Other specified hypothyroidism: Secondary | ICD-10-CM | POA: Diagnosis not present

## 2017-06-12 DIAGNOSIS — N3000 Acute cystitis without hematuria: Secondary | ICD-10-CM | POA: Diagnosis not present

## 2017-06-12 DIAGNOSIS — E119 Type 2 diabetes mellitus without complications: Secondary | ICD-10-CM | POA: Diagnosis not present

## 2017-06-12 DIAGNOSIS — E782 Mixed hyperlipidemia: Secondary | ICD-10-CM | POA: Diagnosis not present

## 2017-06-12 DIAGNOSIS — R828 Abnormal findings on cytological and histological examination of urine: Secondary | ICD-10-CM | POA: Diagnosis not present

## 2017-06-12 DIAGNOSIS — E1121 Type 2 diabetes mellitus with diabetic nephropathy: Secondary | ICD-10-CM | POA: Diagnosis not present

## 2017-06-12 DIAGNOSIS — M0589 Other rheumatoid arthritis with rheumatoid factor of multiple sites: Secondary | ICD-10-CM | POA: Diagnosis not present

## 2017-06-19 DIAGNOSIS — R5382 Chronic fatigue, unspecified: Secondary | ICD-10-CM | POA: Diagnosis not present

## 2017-06-19 DIAGNOSIS — Z6831 Body mass index (BMI) 31.0-31.9, adult: Secondary | ICD-10-CM | POA: Diagnosis not present

## 2017-06-19 DIAGNOSIS — M150 Primary generalized (osteo)arthritis: Secondary | ICD-10-CM | POA: Diagnosis not present

## 2017-06-19 DIAGNOSIS — E669 Obesity, unspecified: Secondary | ICD-10-CM | POA: Diagnosis not present

## 2017-06-19 DIAGNOSIS — M0579 Rheumatoid arthritis with rheumatoid factor of multiple sites without organ or systems involvement: Secondary | ICD-10-CM | POA: Diagnosis not present

## 2017-06-20 DIAGNOSIS — S83241A Other tear of medial meniscus, current injury, right knee, initial encounter: Secondary | ICD-10-CM | POA: Diagnosis not present

## 2017-06-20 DIAGNOSIS — S83231A Complex tear of medial meniscus, current injury, right knee, initial encounter: Secondary | ICD-10-CM | POA: Diagnosis not present

## 2017-06-20 DIAGNOSIS — G8918 Other acute postprocedural pain: Secondary | ICD-10-CM | POA: Diagnosis not present

## 2017-06-20 DIAGNOSIS — M94261 Chondromalacia, right knee: Secondary | ICD-10-CM | POA: Diagnosis not present

## 2017-06-20 DIAGNOSIS — M2241 Chondromalacia patellae, right knee: Secondary | ICD-10-CM | POA: Diagnosis not present

## 2017-07-04 DIAGNOSIS — M25561 Pain in right knee: Secondary | ICD-10-CM | POA: Insufficient documentation

## 2017-07-04 HISTORY — DX: Pain in right knee: M25.561

## 2017-07-09 DIAGNOSIS — J029 Acute pharyngitis, unspecified: Secondary | ICD-10-CM | POA: Diagnosis not present

## 2017-07-17 DIAGNOSIS — R05 Cough: Secondary | ICD-10-CM | POA: Diagnosis not present

## 2017-07-17 DIAGNOSIS — J4541 Moderate persistent asthma with (acute) exacerbation: Secondary | ICD-10-CM | POA: Diagnosis not present

## 2017-07-18 DIAGNOSIS — R05 Cough: Secondary | ICD-10-CM | POA: Diagnosis not present

## 2017-07-18 DIAGNOSIS — R0602 Shortness of breath: Secondary | ICD-10-CM | POA: Diagnosis not present

## 2017-08-01 DIAGNOSIS — M25561 Pain in right knee: Secondary | ICD-10-CM | POA: Diagnosis not present

## 2017-08-01 DIAGNOSIS — Z4789 Encounter for other orthopedic aftercare: Secondary | ICD-10-CM | POA: Diagnosis not present

## 2017-08-13 DIAGNOSIS — J13 Pneumonia due to Streptococcus pneumoniae: Secondary | ICD-10-CM | POA: Diagnosis not present

## 2017-08-13 DIAGNOSIS — J060 Acute laryngopharyngitis: Secondary | ICD-10-CM | POA: Diagnosis not present

## 2017-08-13 DIAGNOSIS — J189 Pneumonia, unspecified organism: Secondary | ICD-10-CM | POA: Diagnosis not present

## 2017-08-24 DIAGNOSIS — I509 Heart failure, unspecified: Secondary | ICD-10-CM | POA: Diagnosis not present

## 2017-08-24 DIAGNOSIS — I110 Hypertensive heart disease with heart failure: Secondary | ICD-10-CM | POA: Diagnosis not present

## 2017-08-24 DIAGNOSIS — J441 Chronic obstructive pulmonary disease with (acute) exacerbation: Secondary | ICD-10-CM | POA: Diagnosis not present

## 2017-08-24 DIAGNOSIS — R05 Cough: Secondary | ICD-10-CM | POA: Diagnosis not present

## 2017-08-24 DIAGNOSIS — R0602 Shortness of breath: Secondary | ICD-10-CM | POA: Diagnosis not present

## 2017-08-24 DIAGNOSIS — Z87891 Personal history of nicotine dependence: Secondary | ICD-10-CM | POA: Diagnosis not present

## 2017-08-24 DIAGNOSIS — M1990 Unspecified osteoarthritis, unspecified site: Secondary | ICD-10-CM | POA: Diagnosis not present

## 2017-08-24 DIAGNOSIS — Z8673 Personal history of transient ischemic attack (TIA), and cerebral infarction without residual deficits: Secondary | ICD-10-CM | POA: Diagnosis not present

## 2017-09-11 DIAGNOSIS — E038 Other specified hypothyroidism: Secondary | ICD-10-CM | POA: Diagnosis not present

## 2017-09-11 DIAGNOSIS — Z6832 Body mass index (BMI) 32.0-32.9, adult: Secondary | ICD-10-CM | POA: Diagnosis not present

## 2017-09-11 DIAGNOSIS — E782 Mixed hyperlipidemia: Secondary | ICD-10-CM | POA: Diagnosis not present

## 2017-09-11 DIAGNOSIS — E1169 Type 2 diabetes mellitus with other specified complication: Secondary | ICD-10-CM | POA: Diagnosis not present

## 2017-09-11 DIAGNOSIS — E668 Other obesity: Secondary | ICD-10-CM | POA: Diagnosis not present

## 2017-09-11 DIAGNOSIS — G252 Other specified forms of tremor: Secondary | ICD-10-CM | POA: Diagnosis not present

## 2017-09-11 DIAGNOSIS — F320 Major depressive disorder, single episode, mild: Secondary | ICD-10-CM | POA: Diagnosis not present

## 2017-09-11 DIAGNOSIS — E1121 Type 2 diabetes mellitus with diabetic nephropathy: Secondary | ICD-10-CM | POA: Diagnosis not present

## 2017-09-11 DIAGNOSIS — M0589 Other rheumatoid arthritis with rheumatoid factor of multiple sites: Secondary | ICD-10-CM | POA: Diagnosis not present

## 2017-09-12 DIAGNOSIS — M25561 Pain in right knee: Secondary | ICD-10-CM | POA: Diagnosis not present

## 2017-09-12 DIAGNOSIS — Z4789 Encounter for other orthopedic aftercare: Secondary | ICD-10-CM | POA: Diagnosis not present

## 2017-09-17 DIAGNOSIS — E669 Obesity, unspecified: Secondary | ICD-10-CM | POA: Diagnosis not present

## 2017-09-17 DIAGNOSIS — M150 Primary generalized (osteo)arthritis: Secondary | ICD-10-CM | POA: Diagnosis not present

## 2017-09-17 DIAGNOSIS — R5382 Chronic fatigue, unspecified: Secondary | ICD-10-CM | POA: Diagnosis not present

## 2017-09-17 DIAGNOSIS — Z6831 Body mass index (BMI) 31.0-31.9, adult: Secondary | ICD-10-CM | POA: Diagnosis not present

## 2017-09-17 DIAGNOSIS — M0579 Rheumatoid arthritis with rheumatoid factor of multiple sites without organ or systems involvement: Secondary | ICD-10-CM | POA: Diagnosis not present

## 2017-10-05 DIAGNOSIS — M791 Myalgia, unspecified site: Secondary | ICD-10-CM | POA: Diagnosis not present

## 2017-10-05 DIAGNOSIS — M545 Low back pain: Secondary | ICD-10-CM | POA: Diagnosis not present

## 2017-12-11 DIAGNOSIS — M25561 Pain in right knee: Secondary | ICD-10-CM | POA: Diagnosis not present

## 2017-12-11 DIAGNOSIS — Z9889 Other specified postprocedural states: Secondary | ICD-10-CM | POA: Diagnosis not present

## 2017-12-20 DIAGNOSIS — M0579 Rheumatoid arthritis with rheumatoid factor of multiple sites without organ or systems involvement: Secondary | ICD-10-CM | POA: Diagnosis not present

## 2017-12-20 DIAGNOSIS — R5382 Chronic fatigue, unspecified: Secondary | ICD-10-CM | POA: Diagnosis not present

## 2017-12-20 DIAGNOSIS — E669 Obesity, unspecified: Secondary | ICD-10-CM | POA: Diagnosis not present

## 2017-12-20 DIAGNOSIS — M150 Primary generalized (osteo)arthritis: Secondary | ICD-10-CM | POA: Diagnosis not present

## 2017-12-20 DIAGNOSIS — Z6831 Body mass index (BMI) 31.0-31.9, adult: Secondary | ICD-10-CM | POA: Diagnosis not present

## 2018-01-04 ENCOUNTER — Other Ambulatory Visit: Payer: Self-pay

## 2018-01-29 ENCOUNTER — Encounter: Payer: Self-pay | Admitting: Gastroenterology

## 2018-02-28 ENCOUNTER — Encounter: Payer: Self-pay | Admitting: Gastroenterology

## 2018-03-01 DIAGNOSIS — F17210 Nicotine dependence, cigarettes, uncomplicated: Secondary | ICD-10-CM | POA: Diagnosis not present

## 2018-03-01 DIAGNOSIS — E038 Other specified hypothyroidism: Secondary | ICD-10-CM | POA: Diagnosis not present

## 2018-03-01 DIAGNOSIS — E1169 Type 2 diabetes mellitus with other specified complication: Secondary | ICD-10-CM | POA: Diagnosis not present

## 2018-03-01 DIAGNOSIS — R5383 Other fatigue: Secondary | ICD-10-CM | POA: Diagnosis not present

## 2018-03-01 DIAGNOSIS — E559 Vitamin D deficiency, unspecified: Secondary | ICD-10-CM | POA: Diagnosis not present

## 2018-03-01 DIAGNOSIS — E782 Mixed hyperlipidemia: Secondary | ICD-10-CM | POA: Diagnosis not present

## 2018-03-01 DIAGNOSIS — Z23 Encounter for immunization: Secondary | ICD-10-CM | POA: Diagnosis not present

## 2018-03-01 DIAGNOSIS — E1121 Type 2 diabetes mellitus with diabetic nephropathy: Secondary | ICD-10-CM | POA: Diagnosis not present

## 2018-03-01 DIAGNOSIS — G4733 Obstructive sleep apnea (adult) (pediatric): Secondary | ICD-10-CM | POA: Diagnosis not present

## 2018-03-01 DIAGNOSIS — R10816 Epigastric abdominal tenderness: Secondary | ICD-10-CM | POA: Diagnosis not present

## 2018-03-01 DIAGNOSIS — J410 Simple chronic bronchitis: Secondary | ICD-10-CM | POA: Diagnosis not present

## 2018-03-01 DIAGNOSIS — R51 Headache: Secondary | ICD-10-CM | POA: Diagnosis not present

## 2018-03-01 DIAGNOSIS — M0579 Rheumatoid arthritis with rheumatoid factor of multiple sites without organ or systems involvement: Secondary | ICD-10-CM | POA: Diagnosis not present

## 2018-03-01 DIAGNOSIS — J018 Other acute sinusitis: Secondary | ICD-10-CM | POA: Diagnosis not present

## 2018-03-01 DIAGNOSIS — R413 Other amnesia: Secondary | ICD-10-CM | POA: Diagnosis not present

## 2018-03-29 ENCOUNTER — Ambulatory Visit (AMBULATORY_SURGERY_CENTER): Payer: Self-pay

## 2018-03-29 VITALS — Ht 65.0 in | Wt 190.2 lb

## 2018-03-29 DIAGNOSIS — Z86010 Personal history of colonic polyps: Secondary | ICD-10-CM

## 2018-03-29 MED ORDER — NA SULFATE-K SULFATE-MG SULF 17.5-3.13-1.6 GM/177ML PO SOLN: 1.0000 | Freq: Once | ORAL | 0 refills | Status: AC

## 2018-03-29 NOTE — Progress Notes (Signed)
No egg or soy allergy known to patient  No issues with past sedation with any surgeries  or procedures, no intubation problems  No diet pills per patient No home 02 use per patient  No blood thinners per patient  Pt denies issues with constipation  No A fib or A flutter  EMMI video sent to pt's e mail  Pt. declined 

## 2018-04-09 DIAGNOSIS — Z6831 Body mass index (BMI) 31.0-31.9, adult: Secondary | ICD-10-CM | POA: Diagnosis not present

## 2018-04-09 DIAGNOSIS — R5382 Chronic fatigue, unspecified: Secondary | ICD-10-CM | POA: Diagnosis not present

## 2018-04-09 DIAGNOSIS — E669 Obesity, unspecified: Secondary | ICD-10-CM | POA: Diagnosis not present

## 2018-04-09 DIAGNOSIS — M150 Primary generalized (osteo)arthritis: Secondary | ICD-10-CM | POA: Diagnosis not present

## 2018-04-09 DIAGNOSIS — M0579 Rheumatoid arthritis with rheumatoid factor of multiple sites without organ or systems involvement: Secondary | ICD-10-CM | POA: Diagnosis not present

## 2018-04-11 ENCOUNTER — Ambulatory Visit (AMBULATORY_SURGERY_CENTER): Payer: Medicare Other | Admitting: Gastroenterology

## 2018-04-11 ENCOUNTER — Encounter: Payer: Self-pay | Admitting: Gastroenterology

## 2018-04-11 VITALS — BP 131/78 | HR 88 | Temp 97.7°F | Resp 15 | Ht 65.0 in | Wt 190.0 lb

## 2018-04-11 DIAGNOSIS — Z86010 Personal history of colonic polyps: Secondary | ICD-10-CM | POA: Diagnosis not present

## 2018-04-11 DIAGNOSIS — D122 Benign neoplasm of ascending colon: Secondary | ICD-10-CM

## 2018-04-11 DIAGNOSIS — D124 Benign neoplasm of descending colon: Secondary | ICD-10-CM

## 2018-04-11 DIAGNOSIS — Z1211 Encounter for screening for malignant neoplasm of colon: Secondary | ICD-10-CM | POA: Diagnosis not present

## 2018-04-11 MED ORDER — SODIUM CHLORIDE 0.9 % IV SOLN: 500.0000 mL | Freq: Once | INTRAVENOUS | Status: DC

## 2018-04-11 NOTE — Progress Notes (Signed)
To PACU, VSS. Report to [email or web address] 

## 2018-04-11 NOTE — Op Note (Signed)
[facility] Patient Name: Brenda Cochran Procedure Date: 04/11/2018 9:09 AM MRN: [id number] Endoscopist: Jackquline Denmark , MD Age: 67 Referring MD:  Date of Birth: [birth year] Gender: Female Account #: [id number] Procedure:                Colonoscopy Indications:              History of tubulovillous adenoma status post                            resection May 2013, colonic polyps status post                            polypectomy 02/2013 Medicines:                Monitored Anesthesia Care Procedure:                Pre-Anesthesia Assessment:                           - Prior to the procedure, a History and Physical                            was performed, and patient medications and                            allergies were reviewed. The patient's tolerance of                            previous anesthesia was also reviewed. The risks                            and benefits of the procedure and the sedation                            options and risks were discussed with the patient.                            All questions were answered, and informed consent                            was obtained. Prior Anticoagulants: The patient has                            taken no previous anticoagulant or antiplatelet                            agents. ASA Grade Assessment: II - A patient with                            mild systemic disease. After reviewing the risks                            and benefits, the patient was deemed in  satisfactory condition to undergo the procedure.                           After obtaining informed consent, the colonoscope                            was passed under direct vision. Throughout the                            procedure, the patient's blood pressure, pulse, and                            oxygen saturations were monitored continuously. The                            Model PCF-[id number] scope was introduced                            through the anus and advanced to the 2 cm into the                            ileum. The colonoscopy was performed without                            difficulty. The patient tolerated the procedure                            well. The quality of the bowel preparation was good. Scope In: 9:13:46 AM Scope Out: 9:28:46 AM Scope Withdrawal Time: 0 hours 11 minutes 53 seconds  Total Procedure Duration: 0 hours 15 minutes 0 seconds  Findings:                 3 small polyps in the descending colon and one                            small polyp in the ascending colon. The polyps were                            4 to 6 mm in size. These polyps were removed with a                            cold snare. Resection and retrieval were complete.                            Estimated blood loss: none. A tattoo was visualized                            at 45 cm in the proximal sigmoid colon. There was                            no recurrence.  Non-bleeding internal hemorrhoids were found during                            retroflexion. The hemorrhoids were small.                           The exam was otherwise without abnormality on                            direct and retroflexion views.                           The terminal ileum appeared normal. Complications:            No immediate complications. Estimated Blood Loss:     Estimated blood loss: none. Impression:               - Colonic polyps status post polypectomy.                           - Non-bleeding internal hemorrhoids.                           - Otherwise normal colonoscopy to TI.                           - No recurrence of polyp at the site of previous                            polypectomy (tattoo). Recommendation:           - Patient has a contact number available for                            emergencies. The signs and symptoms of potential                            delayed  complications were discussed with the                            patient. Return to normal activities tomorrow.                            Written discharge instructions were provided to the                            patient.                           - Resume previous diet.                           - Continue present medications.                           - No ibuprofen, naproxen, or other non-steroidal  anti-inflammatory drugs for 5 days after polyp                            removal.                           - Await pathology results.                           - Repeat colonoscopy for surveillance based on                            pathology results.                           - Return to GI clinic PRN. Jackquline Denmark, MD 04/11/2018 9:37:56 AM This report has been signed electronically.

## 2018-04-11 NOTE — Progress Notes (Signed)
Called to room to assist during endoscopic procedure.  Patient ID and intended procedure confirmed with present staff. Received instructions for my participation in the procedure from the performing physician.  

## 2018-04-11 NOTE — Patient Instructions (Signed)
Polyp information given.   YOU HAD AN ENDOSCOPIC PROCEDURE TODAY AT THE [facility]:   Refer to the procedure report that was given to you for any specific questions about what was found during the examination.  If the procedure report does not answer your questions, please call your gastroenterologist to clarify.  If you requested that your care partner not be given the details of your procedure findings, then the procedure report has been included in a sealed envelope for you to review at your convenience later.  YOU SHOULD EXPECT: Some feelings of bloating in the abdomen. Passage of more gas than usual.  Walking can help get rid of the air that was put into your GI tract during the procedure and reduce the bloating. If you had a lower endoscopy (such as a colonoscopy or flexible sigmoidoscopy) you may notice spotting of blood in your stool or on the toilet paper. If you underwent a bowel prep for your procedure, you may not have a normal bowel movement for a few days.  Please Note:  You might notice some irritation and congestion in your nose or some drainage.  This is from the oxygen used during your procedure.  There is no need for concern and it should clear up in a day or so.  SYMPTOMS TO REPORT IMMEDIATELY:   Following lower endoscopy (colonoscopy or flexible sigmoidoscopy):  Excessive amounts of blood in the stool  Significant tenderness or worsening of abdominal pains  Swelling of the abdomen that is new, acute  Fever of 100F or higher   For urgent or emergent issues, a gastroenterologist can be reached at any hour by calling [phone].   DIET:  We do recommend a small meal at first, but then you may proceed to your regular diet.  Drink plenty of fluids but you should avoid alcoholic beverages for 24 hours.  ACTIVITY:  You should plan to take it easy for the rest of today and you should NOT DRIVE or use heavy machinery until tomorrow (because of the sedation  medicines used during the test).    FOLLOW UP: Our staff will call the number listed on your records the next business day following your procedure to check on you and address any questions or concerns that you may have regarding the information given to you following your procedure. If we do not reach you, we will leave a message.  However, if you are feeling well and you are not experiencing any problems, there is no need to return our call.  We will assume that you have returned to your regular daily activities without incident.  If any biopsies were taken you will be contacted by phone or by letter within the next 1-3 weeks.  Please call us at [phone] if you have not heard about the biopsies in 3 weeks.    SIGNATURES/CONFIDENTIALITY: You and/or your care partner have signed paperwork which will be entered into your electronic medical record.  These signatures attest to the fact that that the information above on your After Visit Summary has been reviewed and is understood.  Full responsibility of the confidentiality of this discharge information lies with you and/or your care-partner. 

## 2018-04-11 NOTE — Progress Notes (Addendum)
Pt's states no medical or surgical changes since previsit or office visit.  Informed Dr Lyndel Safe that patient did not complete her morning prep, patient stating she did take a 2 day prep. Last bm was clear yellow.

## 2018-04-12 ENCOUNTER — Telehealth: Payer: Self-pay | Admitting: *Deleted

## 2018-04-12 NOTE — Telephone Encounter (Signed)
  Follow up Call-  Call back number 04/11/2018  Post procedure Call Back phone  # [phone]  Permission to leave phone message Yes  Some recent data might be hidden     Patient questions:  Do you have a fever, pain , or abdominal swelling? No. Pain Score  0 *  Have you tolerated food without any problems? Yes.    Have you been able to return to your normal activities? Yes.    Do you have any questions about your discharge instructions: Diet   No. Medications  No. Follow up visit  No.  Do you have questions or concerns about your Care? No.  Actions: * If pain score is 4 or above: No action needed, pain <4.

## 2018-04-20 ENCOUNTER — Encounter: Payer: Self-pay | Admitting: Gastroenterology

## 2018-04-20 LAB — HM COLONOSCOPY

## 2018-04-22 ENCOUNTER — Other Ambulatory Visit: Payer: Self-pay

## 2018-07-11 ENCOUNTER — Encounter (HOSPITAL_COMMUNITY): Payer: Self-pay | Admitting: *Deleted

## 2018-07-11 DIAGNOSIS — M069 Rheumatoid arthritis, unspecified: Secondary | ICD-10-CM | POA: Diagnosis not present

## 2018-07-11 DIAGNOSIS — J449 Chronic obstructive pulmonary disease, unspecified: Secondary | ICD-10-CM | POA: Diagnosis not present

## 2018-07-11 DIAGNOSIS — M1990 Unspecified osteoarthritis, unspecified site: Secondary | ICD-10-CM | POA: Diagnosis not present

## 2018-07-11 DIAGNOSIS — Z7952 Long term (current) use of systemic steroids: Secondary | ICD-10-CM | POA: Diagnosis not present

## 2018-07-11 DIAGNOSIS — I214 Non-ST elevation (NSTEMI) myocardial infarction: Secondary | ICD-10-CM | POA: Diagnosis not present

## 2018-07-11 DIAGNOSIS — Z79891 Long term (current) use of opiate analgesic: Secondary | ICD-10-CM | POA: Diagnosis not present

## 2018-07-11 DIAGNOSIS — I5032 Chronic diastolic (congestive) heart failure: Secondary | ICD-10-CM | POA: Diagnosis not present

## 2018-07-11 DIAGNOSIS — I110 Hypertensive heart disease with heart failure: Secondary | ICD-10-CM | POA: Diagnosis not present

## 2018-07-11 DIAGNOSIS — R079 Chest pain, unspecified: Secondary | ICD-10-CM | POA: Diagnosis not present

## 2018-07-11 DIAGNOSIS — Z87891 Personal history of nicotine dependence: Secondary | ICD-10-CM | POA: Diagnosis not present

## 2018-07-11 DIAGNOSIS — E119 Type 2 diabetes mellitus without complications: Secondary | ICD-10-CM | POA: Diagnosis not present

## 2018-07-11 DIAGNOSIS — Z8673 Personal history of transient ischemic attack (TIA), and cerebral infarction without residual deficits: Secondary | ICD-10-CM | POA: Diagnosis not present

## 2018-07-11 DIAGNOSIS — Z79899 Other long term (current) drug therapy: Secondary | ICD-10-CM | POA: Diagnosis not present

## 2018-07-12 ENCOUNTER — Inpatient Hospital Stay (HOSPITAL_COMMUNITY): Payer: Medicare Other

## 2018-07-12 ENCOUNTER — Inpatient Hospital Stay (HOSPITAL_COMMUNITY)
Admission: AD | Disposition: A | Discharge status: Home / Self Care | Payer: Self-pay | Source: Other Acute Inpatient Hospital | Attending: Internal Medicine

## 2018-07-12 ENCOUNTER — Inpatient Hospital Stay (HOSPITAL_COMMUNITY)
Admission: AD | Admit: 2018-07-12 | Discharge: 2018-07-13 | DRG: 247 | Disposition: A | Discharge status: Home / Self Care | Payer: Medicare Other | Source: Other Acute Inpatient Hospital | Attending: Internal Medicine | Admitting: Internal Medicine

## 2018-07-12 DIAGNOSIS — I2510 Atherosclerotic heart disease of native coronary artery without angina pectoris: Secondary | ICD-10-CM

## 2018-07-12 DIAGNOSIS — Z79899 Other long term (current) drug therapy: Secondary | ICD-10-CM

## 2018-07-12 DIAGNOSIS — Z79891 Long term (current) use of opiate analgesic: Secondary | ICD-10-CM | POA: Diagnosis not present

## 2018-07-12 DIAGNOSIS — Z8673 Personal history of transient ischemic attack (TIA), and cerebral infarction without residual deficits: Secondary | ICD-10-CM | POA: Diagnosis not present

## 2018-07-12 DIAGNOSIS — G4730 Sleep apnea, unspecified: Secondary | ICD-10-CM | POA: Diagnosis present

## 2018-07-12 DIAGNOSIS — E039 Hypothyroidism, unspecified: Secondary | ICD-10-CM | POA: Diagnosis present

## 2018-07-12 DIAGNOSIS — J449 Chronic obstructive pulmonary disease, unspecified: Secondary | ICD-10-CM | POA: Diagnosis present

## 2018-07-12 DIAGNOSIS — K219 Gastro-esophageal reflux disease without esophagitis: Secondary | ICD-10-CM | POA: Diagnosis present

## 2018-07-12 DIAGNOSIS — Z7952 Long term (current) use of systemic steroids: Secondary | ICD-10-CM

## 2018-07-12 DIAGNOSIS — I219 Acute myocardial infarction, unspecified: Secondary | ICD-10-CM | POA: Diagnosis not present

## 2018-07-12 DIAGNOSIS — E785 Hyperlipidemia, unspecified: Secondary | ICD-10-CM | POA: Diagnosis present

## 2018-07-12 DIAGNOSIS — I214 Non-ST elevation (NSTEMI) myocardial infarction: Secondary | ICD-10-CM | POA: Diagnosis not present

## 2018-07-12 DIAGNOSIS — Z885 Allergy status to narcotic agent status: Secondary | ICD-10-CM | POA: Diagnosis not present

## 2018-07-12 DIAGNOSIS — I2584 Coronary atherosclerosis due to calcified coronary lesion: Secondary | ICD-10-CM | POA: Diagnosis present

## 2018-07-12 DIAGNOSIS — M069 Rheumatoid arthritis, unspecified: Secondary | ICD-10-CM | POA: Diagnosis present

## 2018-07-12 DIAGNOSIS — Z955 Presence of coronary angioplasty implant and graft: Secondary | ICD-10-CM

## 2018-07-12 DIAGNOSIS — E119 Type 2 diabetes mellitus without complications: Secondary | ICD-10-CM | POA: Diagnosis present

## 2018-07-12 DIAGNOSIS — Z888 Allergy status to other drugs, medicaments and biological substances status: Secondary | ICD-10-CM

## 2018-07-12 DIAGNOSIS — I25119 Atherosclerotic heart disease of native coronary artery with unspecified angina pectoris: Secondary | ICD-10-CM | POA: Diagnosis present

## 2018-07-12 DIAGNOSIS — Z91013 Allergy to seafood: Secondary | ICD-10-CM

## 2018-07-12 DIAGNOSIS — Z91041 Radiographic dye allergy status: Secondary | ICD-10-CM

## 2018-07-12 HISTORY — DX: Non-ST elevation (NSTEMI) myocardial infarction: I21.4

## 2018-07-12 HISTORY — PX: CORONARY STENT INTERVENTION: CATH118234

## 2018-07-12 HISTORY — PX: LEFT HEART CATH AND CORONARY ANGIOGRAPHY: CATH118249

## 2018-07-12 HISTORY — PX: CORONARY THROMBECTOMY: CATH118304

## 2018-07-12 LAB — ECHOCARDIOGRAM COMPLETE
Height: 65 in
Weight: 2924.18 oz

## 2018-07-12 LAB — CBC
HCT: 39.6 % (ref 36.0–46.0)
Hemoglobin: 12.8 g/dL (ref 12.0–15.0)
MCH: 29.3 pg (ref 26.0–34.0)
MCHC: 32.3 g/dL (ref 30.0–36.0)
MCV: 90.6 fL (ref 80.0–100.0)
Platelets: 287 10*3/uL (ref 150–400)
RBC: 4.37 MIL/uL (ref 3.87–5.11)
RDW: 12 % (ref 11.5–15.5)
WBC: 7.9 10*3/uL (ref 4.0–10.5)
nRBC: 0 % (ref 0.0–0.2)

## 2018-07-12 LAB — POCT ACTIVATED CLOTTING TIME
Activated Clotting Time: 274 seconds
Activated Clotting Time: 329 seconds

## 2018-07-12 LAB — BASIC METABOLIC PANEL
Anion gap: 11 (ref 5–15)
BUN: 8 mg/dL (ref 8–23)
CO2: 24 mmol/L (ref 22–32)
Calcium: 8.9 mg/dL (ref 8.9–10.3)
Chloride: 105 mmol/L (ref 98–111)
Creatinine, Ser: 0.71 mg/dL (ref 0.44–1.00)
GFR calc Af Amer: >60 mL/min (ref >60)
GFR calc non Af Amer: >60 mL/min (ref >60)
Glucose, Bld: 125 mg/dL — ABNORMAL HIGH (ref 70–99)
Potassium: 3.5 mmol/L (ref 3.5–5.1)
Sodium: 140 mmol/L (ref 135–145)

## 2018-07-12 LAB — PROTIME-INR
INR: 1.05
Prothrombin Time: 13.6 seconds (ref 11.4–15.2)

## 2018-07-12 LAB — HEPARIN LEVEL (UNFRACTIONATED)
Heparin Unfractionated: 0.18 IU/mL — ABNORMAL LOW (ref 0.30–0.70)
Heparin Unfractionated: 0.41 IU/mL (ref 0.30–0.70)

## 2018-07-12 LAB — LIPID PANEL
Cholesterol: 182 mg/dL (ref 0–200)
HDL: 32 mg/dL — ABNORMAL LOW (ref >40)
LDL Cholesterol: 121 mg/dL — ABNORMAL HIGH (ref 0–99)
Total CHOL/HDL Ratio: 5.7 RATIO
Triglycerides: 145 mg/dL (ref <150)
VLDL: 29 mg/dL (ref 0–40)

## 2018-07-12 LAB — MRSA PCR SCREENING: MRSA by PCR: NEGATIVE

## 2018-07-12 LAB — GLUCOSE, CAPILLARY
Glucose-Capillary: 142 mg/dL — ABNORMAL HIGH (ref 70–99)
Glucose-Capillary: 199 mg/dL — ABNORMAL HIGH (ref 70–99)
Glucose-Capillary: 211 mg/dL — ABNORMAL HIGH (ref 70–99)
Glucose-Capillary: 203 mg/dL — ABNORMAL HIGH (ref 70–99)
Glucose-Capillary: 306 mg/dL — ABNORMAL HIGH (ref 70–99)

## 2018-07-12 LAB — TROPONIN I: Troponin I: 0.93 ng/mL (ref <0.03)

## 2018-07-12 LAB — HIV ANTIBODY (ROUTINE TESTING W REFLEX): HIV Screen 4th Generation wRfx: NONREACTIVE

## 2018-07-12 LAB — HEMOGLOBIN A1C
Hgb A1c MFr Bld: 8.1 % — ABNORMAL HIGH (ref 4.8–5.6)
Mean Plasma Glucose: 185.77 mg/dL

## 2018-07-12 SURGERY — LEFT HEART CATH AND CORONARY ANGIOGRAPHY
Anesthesia: LOCAL

## 2018-07-12 MED ORDER — SODIUM CHLORIDE 0.9% FLUSH: 3.0000 mL | INTRAVENOUS | Status: DC | PRN

## 2018-07-12 MED ORDER — SODIUM CHLORIDE 0.9 % IV SOLN: 250.0000 mL | INTRAVENOUS | Status: DC | PRN

## 2018-07-12 MED ORDER — DIPHENHYDRAMINE HCL 25 MG PO CAPS: 50.0000 mg | ORAL_CAPSULE | Freq: Once | ORAL | Status: AC

## 2018-07-12 MED ORDER — METHYLPREDNISOLONE SODIUM SUCC 125 MG IJ SOLR
40.0000 mg | INTRAMUSCULAR | Status: AC
  Administered 2018-07-12 (×3): 40 mg via INTRAVENOUS
  Filled 2018-07-12 (×3): qty 2

## 2018-07-12 MED ORDER — BISMUTH SUBSALICYLATE 262 MG/15ML PO SUSP
30.0000 mL | Freq: Four times a day (QID) | ORAL | Status: DC | PRN
  Filled 2018-07-12: qty 236

## 2018-07-12 MED ORDER — HEPARIN (PORCINE) 25000 UT/250ML-% IV SOLN: 1100.0000 [IU]/h | INTRAVENOUS | Status: DC

## 2018-07-12 MED ORDER — LIDOCAINE HCL (PF) 1 % IJ SOLN
INTRAMUSCULAR | Status: DC | PRN
  Administered 2018-07-12: 2 mL via INTRADERMAL

## 2018-07-12 MED ORDER — ASPIRIN 81 MG PO CHEW
81.0000 mg | CHEWABLE_TABLET | Freq: Every day | ORAL | Status: DC
  Administered 2018-07-13: 81 mg via ORAL
  Filled 2018-07-12: qty 1

## 2018-07-12 MED ORDER — LABETALOL HCL 5 MG/ML IV SOLN: 10.0000 mg | INTRAVENOUS | Status: AC | PRN

## 2018-07-12 MED ORDER — HEPARIN (PORCINE) IN NACL 1000-0.9 UT/500ML-% IV SOLN
INTRAVENOUS | Status: AC
  Filled 2018-07-12: qty 1000

## 2018-07-12 MED ORDER — SODIUM CHLORIDE 0.9 % WEIGHT BASED INFUSION
1.0000 mL/kg/h | INTRAVENOUS | Status: DC
  Administered 2018-07-12: 10:00:00 1 mL/kg/h via INTRAVENOUS

## 2018-07-12 MED ORDER — ACETAMINOPHEN 325 MG PO TABS
650.0000 mg | ORAL_TABLET | ORAL | Status: DC | PRN
  Administered 2018-07-12: 650 mg via ORAL
  Filled 2018-07-12: qty 2

## 2018-07-12 MED ORDER — INSULIN ASPART 100 UNIT/ML ~~LOC~~ SOLN
0.0000 [IU] | Freq: Every day | SUBCUTANEOUS | Status: DC
  Administered 2018-07-12: 23:00:00 4 [IU] via SUBCUTANEOUS

## 2018-07-12 MED ORDER — TICAGRELOR 90 MG PO TABS
ORAL_TABLET | ORAL | Status: AC
  Filled 2018-07-12: qty 1

## 2018-07-12 MED ORDER — NITROGLYCERIN 0.4 MG SL SUBL: 0.4000 mg | SUBLINGUAL_TABLET | SUBLINGUAL | Status: DC | PRN

## 2018-07-12 MED ORDER — TIROFIBAN HCL IN NACL 5-0.9 MG/100ML-% IV SOLN
INTRAVENOUS | Status: AC
  Filled 2018-07-12: qty 100

## 2018-07-12 MED ORDER — TICAGRELOR 90 MG PO TABS
90.0000 mg | ORAL_TABLET | Freq: Two times a day (BID) | ORAL | Status: DC
  Administered 2018-07-13 (×2): 90 mg via ORAL
  Filled 2018-07-12 (×2): qty 1

## 2018-07-12 MED ORDER — ALBUTEROL SULFATE (2.5 MG/3ML) 0.083% IN NEBU: 2.5000 mg | INHALATION_SOLUTION | RESPIRATORY_TRACT | Status: DC | PRN

## 2018-07-12 MED ORDER — ONDANSETRON HCL 4 MG/2ML IJ SOLN: 4.0000 mg | Freq: Four times a day (QID) | INTRAMUSCULAR | Status: DC | PRN

## 2018-07-12 MED ORDER — NITROGLYCERIN IN D5W 200-5 MCG/ML-% IV SOLN: 0.0000 ug/min | INTRAVENOUS | Status: DC

## 2018-07-12 MED ORDER — TICAGRELOR 90 MG PO TABS
ORAL_TABLET | ORAL | Status: AC
  Filled 2018-07-12: qty 2

## 2018-07-12 MED ORDER — SODIUM CHLORIDE 0.9 % WEIGHT BASED INFUSION: 3.0000 mL/kg/h | INTRAVENOUS | Status: DC

## 2018-07-12 MED ORDER — HEPARIN SODIUM (PORCINE) 1000 UNIT/ML IJ SOLN
INTRAMUSCULAR | Status: DC | PRN
  Administered 2018-07-12: 5000 [IU] via INTRAVENOUS
  Administered 2018-07-12: 3000 [IU] via INTRAVENOUS

## 2018-07-12 MED ORDER — TIZANIDINE HCL 4 MG PO TABS
4.0000 mg | ORAL_TABLET | Freq: Every evening | ORAL | Status: DC | PRN
  Filled 2018-07-12: qty 1

## 2018-07-12 MED ORDER — SODIUM CHLORIDE 0.9 % WEIGHT BASED INFUSION
3.0000 mL/kg/h | INTRAVENOUS | Status: DC
  Administered 2018-07-12: 3 mL/kg/h via INTRAVENOUS

## 2018-07-12 MED ORDER — HYDRALAZINE HCL 20 MG/ML IJ SOLN
INTRAMUSCULAR | Status: AC
  Filled 2018-07-12: qty 1

## 2018-07-12 MED ORDER — HEPARIN SODIUM (PORCINE) 1000 UNIT/ML IJ SOLN
INTRAMUSCULAR | Status: DC | PRN
  Administered 2018-07-12: 4000 [IU] via INTRAVENOUS

## 2018-07-12 MED ORDER — ETODOLAC 400 MG PO TABS
400.0000 mg | ORAL_TABLET | Freq: Two times a day (BID) | ORAL | Status: DC
  Administered 2018-07-12 – 2018-07-13 (×3): 400 mg via ORAL
  Filled 2018-07-12 (×4): qty 1

## 2018-07-12 MED ORDER — FENTANYL CITRATE (PF) 100 MCG/2ML IJ SOLN
INTRAMUSCULAR | Status: AC
  Filled 2018-07-12: qty 2

## 2018-07-12 MED ORDER — ONDANSETRON HCL 4 MG/2ML IJ SOLN
4.0000 mg | Freq: Four times a day (QID) | INTRAMUSCULAR | Status: DC | PRN
  Administered 2018-07-12: 4 mg via INTRAVENOUS
  Filled 2018-07-12: qty 2

## 2018-07-12 MED ORDER — FENTANYL CITRATE (PF) 100 MCG/2ML IJ SOLN
INTRAMUSCULAR | Status: DC | PRN
  Administered 2018-07-12 (×3): 25 ug via INTRAVENOUS

## 2018-07-12 MED ORDER — HYDRALAZINE HCL 20 MG/ML IJ SOLN
INTRAMUSCULAR | Status: DC | PRN
  Administered 2018-07-12: 10 mg via INTRAVENOUS

## 2018-07-12 MED ORDER — ATORVASTATIN CALCIUM 80 MG PO TABS
80.0000 mg | ORAL_TABLET | Freq: Every day | ORAL | Status: DC
  Administered 2018-07-12: 80 mg via ORAL
  Filled 2018-07-12: qty 1

## 2018-07-12 MED ORDER — HEPARIN (PORCINE) IN NACL 1000-0.9 UT/500ML-% IV SOLN
INTRAVENOUS | Status: DC | PRN
  Administered 2018-07-12 (×2): 500 mL

## 2018-07-12 MED ORDER — TICAGRELOR 90 MG PO TABS
ORAL_TABLET | ORAL | Status: DC | PRN
  Administered 2018-07-12: 180 mg via ORAL

## 2018-07-12 MED ORDER — HYDRALAZINE HCL 20 MG/ML IJ SOLN: 5.0000 mg | INTRAMUSCULAR | Status: AC | PRN

## 2018-07-12 MED ORDER — ASPIRIN EC 81 MG PO TBEC
81.0000 mg | DELAYED_RELEASE_TABLET | Freq: Every day | ORAL | Status: DC
  Administered 2018-07-12: 12:00:00 81 mg via ORAL
  Filled 2018-07-12: qty 1

## 2018-07-12 MED ORDER — SODIUM CHLORIDE 0.9 % WEIGHT BASED INFUSION: 1.0000 mL/kg/h | INTRAVENOUS | Status: DC

## 2018-07-12 MED ORDER — MIDAZOLAM HCL 2 MG/2ML IJ SOLN
INTRAMUSCULAR | Status: AC
  Filled 2018-07-12: qty 2

## 2018-07-12 MED ORDER — LIDOCAINE HCL (PF) 1 % IJ SOLN
INTRAMUSCULAR | Status: AC
  Filled 2018-07-12: qty 30

## 2018-07-12 MED ORDER — ANGIOPLASTY BOOK
Freq: Once | Status: AC
  Administered 2018-07-12: 17:00:00
  Filled 2018-07-12: qty 1

## 2018-07-12 MED ORDER — SODIUM CHLORIDE 0.9% FLUSH: 3.0000 mL | Freq: Two times a day (BID) | INTRAVENOUS | Status: DC

## 2018-07-12 MED ORDER — HYDROCODONE-ACETAMINOPHEN 5-325 MG PO TABS
1.0000 | ORAL_TABLET | Freq: Four times a day (QID) | ORAL | Status: DC | PRN
  Administered 2018-07-12: 1 via ORAL
  Filled 2018-07-12 (×2): qty 1

## 2018-07-12 MED ORDER — IOHEXOL 350 MG/ML SOLN
INTRAVENOUS | Status: DC | PRN
  Administered 2018-07-12: 110 mL via INTRA_ARTERIAL

## 2018-07-12 MED ORDER — PREDNISONE 5 MG PO TABS
5.0000 mg | ORAL_TABLET | Freq: Every day | ORAL | Status: DC
  Filled 2018-07-12: qty 1

## 2018-07-12 MED ORDER — DIPHENHYDRAMINE HCL 50 MG/ML IJ SOLN
50.0000 mg | Freq: Once | INTRAMUSCULAR | Status: AC
  Administered 2018-07-12: 50 mg via INTRAVENOUS
  Filled 2018-07-12: qty 1

## 2018-07-12 MED ORDER — HEPARIN SODIUM (PORCINE) 1000 UNIT/ML IJ SOLN
INTRAMUSCULAR | Status: AC
  Filled 2018-07-12: qty 1

## 2018-07-12 MED ORDER — MOMETASONE FURO-FORMOTEROL FUM 200-5 MCG/ACT IN AERO
2.0000 | INHALATION_SPRAY | Freq: Two times a day (BID) | RESPIRATORY_TRACT | Status: DC
  Administered 2018-07-12 – 2018-07-13 (×3): 2 via RESPIRATORY_TRACT
  Filled 2018-07-12: qty 8.8

## 2018-07-12 MED ORDER — VARENICLINE TARTRATE 1 MG PO TABS
1.0000 mg | ORAL_TABLET | Freq: Every day | ORAL | Status: DC
  Administered 2018-07-12 – 2018-07-13 (×2): 1 mg via ORAL
  Filled 2018-07-12 (×2): qty 1

## 2018-07-12 MED ORDER — VERAPAMIL HCL 2.5 MG/ML IV SOLN
INTRAVENOUS | Status: AC
  Filled 2018-07-12: qty 2

## 2018-07-12 MED ORDER — ASPIRIN EC 81 MG PO TBEC: 81.0000 mg | DELAYED_RELEASE_TABLET | Freq: Every day | ORAL | Status: DC

## 2018-07-12 MED ORDER — TIROFIBAN HCL IN NACL 5-0.9 MG/100ML-% IV SOLN
INTRAVENOUS | Status: DC | PRN
  Administered 2018-07-12: 0.15 ug/kg/min via INTRAVENOUS

## 2018-07-12 MED ORDER — HEPARIN BOLUS VIA INFUSION
2000.0000 [IU] | Freq: Once | INTRAVENOUS | Status: AC
  Administered 2018-07-12: 05:00:00 2000 [IU] via INTRAVENOUS
  Filled 2018-07-12: qty 2000

## 2018-07-12 MED ORDER — SODIUM CHLORIDE 0.9 % IV SOLN
INTRAVENOUS | Status: AC
  Administered 2018-07-12: 18:00:00 via INTRAVENOUS

## 2018-07-12 MED ORDER — LEFLUNOMIDE 20 MG PO TABS
20.0000 mg | ORAL_TABLET | Freq: Every day | ORAL | Status: DC
  Administered 2018-07-12 – 2018-07-13 (×2): 20 mg via ORAL
  Filled 2018-07-12 (×2): qty 1

## 2018-07-12 MED ORDER — INSULIN ASPART 100 UNIT/ML ~~LOC~~ SOLN
0.0000 [IU] | Freq: Three times a day (TID) | SUBCUTANEOUS | Status: DC
  Administered 2018-07-13: 8 [IU] via SUBCUTANEOUS

## 2018-07-12 MED ORDER — TIROFIBAN (AGGRASTAT) BOLUS VIA INFUSION
INTRAVENOUS | Status: DC | PRN
  Administered 2018-07-12: 2072.5 ug via INTRAVENOUS

## 2018-07-12 MED ORDER — MIDAZOLAM HCL 2 MG/2ML IJ SOLN
INTRAMUSCULAR | Status: DC | PRN
  Administered 2018-07-12 (×3): 1 mg via INTRAVENOUS

## 2018-07-12 MED ORDER — NITROGLYCERIN 1 MG/10 ML FOR IR/CATH LAB
INTRA_ARTERIAL | Status: AC
  Filled 2018-07-12: qty 10

## 2018-07-12 MED ORDER — VERAPAMIL HCL 2.5 MG/ML IV SOLN
INTRAVENOUS | Status: DC | PRN
  Administered 2018-07-12: 10 mL via INTRA_ARTERIAL

## 2018-07-12 SURGICAL SUPPLY — 16 items
BALLN SAPPHIRE ~~LOC~~ 3.25X15 (BALLOONS) ×1 IMPLANT
CATH 5FR JL3.5 JR4 ANG PIG MP (CATHETERS) ×1 IMPLANT
CATH EXTRAC PRONTO 5.5F 138CM (CATHETERS) ×1 IMPLANT
CATH LAUNCHER 6FR EBU 3 (CATHETERS) ×1 IMPLANT
DEVICE RAD COMP TR BAND LRG (VASCULAR PRODUCTS) ×1 IMPLANT
GLIDESHEATH SLEND SS 6F .021 (SHEATH) ×1 IMPLANT
GUIDEWIRE INQWIRE 1.5J.035X260 (WIRE) IMPLANT
INQWIRE 1.5J .035X260CM (WIRE) ×2
KIT ENCORE 26 ADVANTAGE (KITS) ×1 IMPLANT
KIT HEART LEFT (KITS) ×2 IMPLANT
KIT HEMO VALVE WATCHDOG (MISCELLANEOUS) ×1 IMPLANT
PACK CARDIAC CATHETERIZATION (CUSTOM PROCEDURE TRAY) ×2 IMPLANT
STENT SYNERGY DES 3X24 (Permanent Stent) ×1 IMPLANT
TRANSDUCER W/STOPCOCK (MISCELLANEOUS) ×2 IMPLANT
TUBING CIL FLEX 10 FLL-RA (TUBING) ×2 IMPLANT
WIRE ASAHI PROWATER 180CM (WIRE) ×1 IMPLANT

## 2018-07-12 NOTE — Progress Notes (Addendum)
   Patient transferred from [place] early this morning for NSTEMI. Troponin I elevated at [facility] at 1.31 >> 2.44 >> 2.90. ECG this morning showed T wave inversion in leads V3 and V4 which appear new. Patient is on IV Heparin and IV Nitroglycerin and currently chest pain free. Will proceed with left heart catheterization later today. Patient has a dye allergy so she will be pre-medicated beforehand. The patient understands that risks include but are not limited to stroke (1 in 1000), death (1 in 97), kidney failure [usually temporary] (1 in 500), bleeding (1 in 200), allergic reaction [possibly serious] (1 in 200), and agrees to proceed.   Darreld Mclean, PA-C 07/12/2018 7:57 AM  Patient interviewed and examined personally. Consent reviewed with patient and family. Agree with proceeding with coronary angiography +/- PCI. No bleeding complications or barriers to DAPT.  I have instructed the patient that dual antiplatelet therapy should be taken for 1 year without interruption, if indicated.  We have discussed the consequences of interrupted dual antiplatelet therapy and the risk for in-stent thrombosis.   Elouise Munroe 12:44 PM 07/12/18

## 2018-07-12 NOTE — Progress Notes (Signed)
[facility] for heparin Indication: NSTEMI  Allergies  Allergen Reactions  . Shellfish Allergy Anaphylaxis  . Contrast Media [Iodinated Diagnostic Agents] Rash  . Metformin And Related Nausea And Vomiting  . Cymbalta [Duloxetine Hcl] Nausea And Vomiting  . Ultram [Tramadol] Nausea And Vomiting  . Oxycontin [Oxycodone Hcl] Palpitations    Patient Measurements: Height: 5\' 5"  (165.1 cm) Weight: 182 lb 12.2 oz (82.9 kg) IBW/kg (Calculated) : 57 Heparin Dosing Weight: 75kg  Medical History: Past Medical History:  Diagnosis Date  . Asthma   . COPD (chronic obstructive pulmonary disease) ([place])   . Depression   . Fibromyalgia   . GERD (gastroesophageal reflux disease)   . Hyperlipidemia   . Hypothyroidism (acquired)   . Osteoarthritis   . RA (rheumatoid arthritis) ([place])    [name]  . Skin cancer    face  . Sleep apnea    cpap  . Type 2 diabetes mellitus [facility])     Assessment: 68yo female tx'd to [facility] for NSTEMI, to continue heparin started at OSH.  Heparin now at goal, plan for cath today.   Goal of Therapy:  Heparin level 0.3-0.7 units/ml Monitor platelets by anticoagulation protocol: Yes   Plan:  Continue heparin at current rate Follow up after procedure  Erin Hearing PharmD., BCPS Clinical Pharmacist 07/12/2018 12:26 PM

## 2018-07-12 NOTE — H&P (View-Only) (Signed)
   Patient transferred from [place] early this morning for NSTEMI. Troponin I elevated at [facility] at 1.31 >> 2.44 >> 2.90. ECG this morning showed T wave inversion in leads V3 and V4 which appear new. Patient is on IV Heparin and IV Nitroglycerin and currently chest pain free. Will proceed with left heart catheterization later today. Patient has a dye allergy so she will be pre-medicated beforehand. The patient understands that risks include but are not limited to stroke (1 in 1000), death (1 in 70), kidney failure [usually temporary] (1 in 500), bleeding (1 in 200), allergic reaction [possibly serious] (1 in 200), and agrees to proceed.   Darreld Mclean, PA-C 07/12/2018 7:57 AM  Patient interviewed and examined personally. Consent reviewed with patient and family. Agree with proceeding with coronary angiography +/- PCI. No bleeding complications or barriers to DAPT.  I have instructed the patient that dual antiplatelet therapy should be taken for 1 year without interruption, if indicated.  We have discussed the consequences of interrupted dual antiplatelet therapy and the risk for in-stent thrombosis.   Elouise Munroe 12:44 PM 07/12/18

## 2018-07-12 NOTE — Interval H&P Note (Signed)
Cath Lab Visit (complete for each Cath Lab visit)  Clinical Evaluation Leading to the Procedure:   ACS: Yes.    Non-ACS:    Anginal Classification: CCS IV  Anti-ischemic medical therapy: Minimal Therapy (1 class of medications)  Non-Invasive Test Results: No non-invasive testing performed  Prior CABG: No previous CABG      History and Physical Interval Note:  07/12/2018 1:41 PM  Brenda Cochran  has presented today for surgery, with the diagnosis of n-stemi  The various methods of treatment have been discussed with the patient and family. After consideration of risks, benefits and other options for treatment, the patient has consented to  Procedure(s): LEFT HEART CATH AND CORONARY ANGIOGRAPHY (N/A) as a surgical intervention .  The patient's history has been reviewed, patient examined, no change in status, stable for surgery.  I have reviewed the patient's chart and labs.  Questions were answered to the patient's satisfaction.     Larae Grooms

## 2018-07-12 NOTE — Progress Notes (Addendum)
ANTICOAGULATION CONSULT NOTE - Initial Consult  Pharmacy Consult for heparin Indication: NSTEMI  Allergies  Allergen Reactions  . Shellfish Allergy Anaphylaxis  . Contrast Media [Iodinated Diagnostic Agents] Rash  . Metformin And Related Nausea And Vomiting  . Cymbalta [Duloxetine Hcl] Nausea And Vomiting  . Ultram [Tramadol] Nausea And Vomiting  . Oxycontin [Oxycodone Hcl] Palpitations    Patient Measurements: Height: 5\' 5"  (165.1 cm) Weight: 182 lb 12.2 oz (82.9 kg) IBW/kg (Calculated) : 57 Heparin Dosing Weight: 75kg  Medical History: Past Medical History:  Diagnosis Date  . Asthma   . COPD (chronic obstructive pulmonary disease) ([place])   . Depression   . Fibromyalgia   . GERD (gastroesophageal reflux disease)   . Hyperlipidemia   . Hypothyroidism (acquired)   . Osteoarthritis   . RA (rheumatoid arthritis) ([place])    [name]  . Skin cancer    face  . Sleep apnea    cpap  . Type 2 diabetes mellitus [facility])     Assessment: 68yo female tx'd to [facility] for NSTEMI, to continue heparin started at OSH.  Goal of Therapy:  Heparin level 0.3-0.7 units/ml Monitor platelets by anticoagulation protocol: Yes   Plan:  Rec'd heparin 4000 units IV bolus followed by gtt at 900 units/hr at [facility]; will continue for now and monitor heparin levels and CBC.  Wynona Neat, PharmD, BCPS  07/12/2018,1:36 AM   ADDENDUM: Initial heparin level below goal at 0.18.  Will rebolus with heparin 2000 units and increase heparin gtt by 3 units/kgABW/hr to 1100 units/hr and check level in 6 hours.   VB 4:25 AM

## 2018-07-12 NOTE — H&P (Signed)
Cardiology History & Physical    Patient ID: Brenda Cochran MRN: [id number], DOB: May 19, 1951 Date of Encounter: 07/12/2018, 3:04 AM Primary Physician: Rochel Brome, MD  Chief Complaint: chest pain   HPI: Brenda Cochran is a 68 y.o. female with history of diabetes, hyperlipidemia, asthma, TIA, carotid disease, rheumatoid arthritis presents today the [facility] emergency room with chest pain.  Patient states her symptoms began around 12 PM this afternoon when she developed central chest pain that felt like someone sitting on her chest, was associated with a shortness of breath and nausea.  The pain radiated down her left arm.  She presented to the emergency room where she was given Nitropaste with improvement of her pain.  Nitropaste was converted to a nitroglycerin infusion and she was bolused with heparin and was pain-free at the time of transfer.  The time of my evaluation the patient complained of mild headache, but no chest pain.  Patient states that she is otherwise been in good health.  She states that last night before going to bed she had some vertiginous feelings with some dizziness, but was able to fall asleep without difficulty.  She states that she has had a left heart catheterization in the past which demonstrated no obstructive coronary disease.  Past Medical History:  Diagnosis Date  . Asthma   . COPD (chronic obstructive pulmonary disease) ([place])   . Depression   . Fibromyalgia   . GERD (gastroesophageal reflux disease)   . Hyperlipidemia   . Hypothyroidism (acquired)   . Osteoarthritis   . RA (rheumatoid arthritis) ([place])    Brenda Cochran  . Skin cancer    face  . Sleep apnea    cpap  . Type 2 diabetes mellitus [facility])      Surgical History:  Past Surgical History:  Procedure Laterality Date  . ABDOMINAL HYSTERECTOMY  1984  . BREAST LUMPECTOMY  1976   left, benign  . CHOLECYSTECTOMY  09/05/10     Home Meds: Prior to Admission medications   Medication Sig Start Date  End Date Taking? Authorizing Provider  albuterol (PROVENTIL HFA;VENTOLIN HFA) 108 (90 Base) MCG/ACT inhaler Inhale 2 puffs into the lungs every 4 (four) hours as needed for wheezing or shortness of breath.    [provider]  bismuth subsalicylate (PEPTO BISMOL) 262 MG/15ML suspension Take 30 mLs by mouth every 6 (six) hours as needed (for nausea/vomiting).    [provider]  budesonide-formoterol (SYMBICORT) 160-4.5 MCG/ACT inhaler Inhale 1 puff into the lungs 2 (two) times daily.     [provider]  etodolac (LODINE) 400 MG tablet Take 400 mg by mouth 2 (two) times daily after a meal.    [provider]  HYDROcodone-acetaminophen (NORCO/VICODIN) 5-325 MG tablet hydrocodone 5 mg-acetaminophen 325 mg tablet    [provider]  leflunomide (ARAVA) 20 MG tablet Take 20 mg by mouth daily.    [provider]  predniSONE (DELTASONE) 5 MG tablet Take 5 mg by mouth daily with breakfast.    [provider]  rosuvastatin (CRESTOR) 10 MG tablet rosuvastatin 10 mg tablet    [provider]  tiZANidine (ZANAFLEX) 4 MG tablet Take 4 mg by mouth at bedtime as needed for muscle spasms.     [provider]  varenicline (CHANTIX) 1 MG tablet Chantix 1 mg tablet    [provider]    Allergies:  Allergies  Allergen Reactions  . Shellfish Allergy Anaphylaxis  . Contrast Media [Iodinated Diagnostic  Agents] Rash  . Metformin And Related Nausea And Vomiting  . Cymbalta [Duloxetine Hcl] Nausea And Vomiting  . Ultram [Tramadol] Nausea And Vomiting  . Oxycontin [Oxycodone Hcl] Palpitations    Social History   Socioeconomic History  . Marital status: Married    Spouse name: Not on file  . Number of children: Not on file  . Years of education: Not on file  . Highest education level: Not on file  Occupational History  . Occupation: disabled    Comment: jockey  Social Needs  . Financial resource strain: Not on file  .  Food insecurity:    Worry: Not on file    Inability: Not on file  . Transportation needs:    Medical: Not on file    Non-medical: Not on file  Tobacco Use  . Smoking status: Current Every Day Smoker    Packs/day: 1.00    Years: 30.00    Pack years: 30.00    Types: Cigarettes    Last attempt to quit: 06/04/2010    Years since quitting: 8.1  . Smokeless tobacco: Current User  Substance and Sexual Activity  . Alcohol use: No  . Drug use: No  . Sexual activity: Not on file  Lifestyle  . Physical activity:    Days per week: Not on file    Minutes per session: Not on file  . Stress: Not on file  Relationships  . Social connections:    Talks on phone: Not on file    Gets together: Not on file    Attends religious service: Not on file    Active member of club or organization: Not on file    Attends meetings of clubs or organizations: Not on file    Relationship status: Not on file  . Intimate partner violence:    Fear of current or ex partner: Not on file    Emotionally abused: Not on file    Physically abused: Not on file    Forced sexual activity: Not on file  Other Topics Concern  . Not on file  Social History Narrative   Lives with Brenda Cochran husband.   Disabled     Family History  Problem Relation Age of Onset  . Breast cancer Sister   . Lymphoma Sister   . Multiple sclerosis Brother   . Muscular dystrophy Brother   . CAD Other   . Diabetes Other   . Hypertension Other   . Seizures Other   . Lung disease Mother        never smoker  . Lung disease Maternal Uncle   . Lung disease Maternal Uncle   . Lung cancer Father   . Bone cancer Father   . Colon cancer Neg Hx   . Colon polyps Neg Hx   . Esophageal cancer Neg Hx   . Stomach cancer Neg Hx   . Rectal cancer Neg Hx     Review of Systems: All other systems reviewed and are otherwise negative except as noted above.  Labs:   Lab Results  Component Value Date   WBC 7.8 09/14/2016   HGB 14.7 09/14/2016     HCT 42.6 09/14/2016   MCV 90.3 09/14/2016   PLT 277 09/14/2016   No results for input(s): NA, K, CL, CO2, BUN, CREATININE, CALCIUM, PROT, BILITOT, ALKPHOS, ALT, AST, GLUCOSE in the last 168 hours.  Invalid input(s): LABALBU No results for input(s): CKTOTAL, CKMB, TROPONINI in the last 72 hours. Lab Results  Component  Value Date   CHOL 189 09/15/2016   HDL 40 (L) 09/15/2016   LDLCALC 111 (H) 09/15/2016   TRIG 189 (H) 09/15/2016   No results found for: DDIMER  Radiology/Studies:  No results found. Wt Readings from Last 3 Encounters:  07/12/18 82.9 kg  04/11/18 86.2 kg  03/29/18 86.3 kg    EKG: Normal sinus rhythm with T wave inversions in V3 through V4  Physical Exam: Blood pressure 105/66, pulse 67, temperature 98.2 F (36.8 C), temperature source Oral, resp. rate 14, height '5\' 5"'$  (1.651 m), weight 82.9 kg, SpO2 100 %. Body mass index is 30.41 kg/m. General: Well developed, well nourished, in no acute distress. Head: Normocephalic, atraumatic, sclera non-icteric, no xanthomas, nares are without discharge.  Neck: Negative for carotid bruits. JVD not elevated. Lungs: Clear bilaterally to auscultation without wheezes, rales, or rhonchi. Breathing is unlabored. Heart: RRR with S1 S2. No murmurs, rubs, or gallops appreciated. Abdomen: Soft, non-tender, non-distended with normoactive bowel sounds. No hepatomegaly. No rebound/guarding. No obvious abdominal masses. Msk:  Strength and tone appear normal for age. Extremities: No clubbing or cyanosis. No edema.  Distal pedal pulses are 2+ and equal bilaterally. Neuro: Alert and oriented X 3. No focal deficit. No facial asymmetry. Moves all extremities spontaneously. Psych:  Responds to questions appropriately with a normal affect.   Results from [facility]: Labs 07/11/18 at 1624: WBC 9.4 Hb 14.8 Plt 322  Na 137 K 3.5  Cl 102 CO2 22 BUN 10 Cr 0.5 Glu 169  T bili 0.5 AST 26 ALT 21 Alk phos 121   INR 0.9  UA  negative  CXR: No active disease  Troponin: 1.31, 2.44, 2.90 NrProBNP 170  Assessment and Plan   Brenda Cochran is a 68 y.o. female with history of diabetes, hyperlipidemia, asthma, TIA, carotid disease, rheumatoid arthritis presents today the [facility] emergency room with NSTEMI.  #NSTEMI: Patient presents with acute onset chest pain that is typical in nature and duration, and resolves with nitroglycerin typical for angina.  Her ECG demonstrates repolarization abnormalities including T wave inversions in the leads.  At [facility], her troponin was noted to be elevated at 1.31 and trended up to 2.44.  These findings point to a type I ACS event.  She has been treated with aspirin, heparin bolus and infusion and is currently chest pain-free on a nitroglycerin infusion.  We will plan for left heart catheterization in the morning for potential PCI. - TTE in the morning - N.p.o. after midnight for left heart cath in the morning - Left heart cath with potential PCI in morning -- Of note patient reports a dye allergy - Continue aspirin 81 -Continue heparin infusion - We will hold on beta-blocker or his ACE inhibitor at this time given low blood pressure on nitroglycerin infusion, but will initiate prior to discharge - Referral to cardiac rehab post discharge  #Diabetes mellitus, non-insulin dependent, controlled: Last A1c checked in 2018 was 7.0.  Will recheck today - Check A1c -Carb controlled diet  #Hyperlipidemia: LDL 111, last assessed in 2018. -Recheck lipid panel  #Asthma: No active wheezing -Continue home inhalers  #Rheumatoid arthritis: Patient states she takes etodolac, Norco, leflunomide, prednisone for RA.  We will continue these while inpatient -Continue etodolac, Norco, leflunomide, prednisone  Signed, Lujean Amel, MD 07/12/2018, 3:04 AM

## 2018-07-12 NOTE — Progress Notes (Signed)
  Echocardiogram 2D Echocardiogram has been performed.  Brenda Cochran 07/12/2018, 4:11 PM

## 2018-07-12 NOTE — Procedures (Signed)
Echo attempted. Patient in cath lab.

## 2018-07-13 LAB — GLUCOSE, CAPILLARY: Glucose-Capillary: 254 mg/dL — ABNORMAL HIGH (ref 70–99)

## 2018-07-13 LAB — BASIC METABOLIC PANEL
Anion gap: 8 (ref 5–15)
BUN: 14 mg/dL (ref 8–23)
CO2: 22 mmol/L (ref 22–32)
Calcium: 8.4 mg/dL — ABNORMAL LOW (ref 8.9–10.3)
Chloride: 107 mmol/L (ref 98–111)
Creatinine, Ser: 0.8 mg/dL (ref 0.44–1.00)
GFR calc Af Amer: >60 mL/min (ref >60)
GFR calc non Af Amer: >60 mL/min (ref >60)
Glucose, Bld: 273 mg/dL — ABNORMAL HIGH (ref 70–99)
Potassium: 3.9 mmol/L (ref 3.5–5.1)
Sodium: 137 mmol/L (ref 135–145)

## 2018-07-13 LAB — CBC
HCT: 39.4 % (ref 36.0–46.0)
Hemoglobin: 13.1 g/dL (ref 12.0–15.0)
MCH: 29.6 pg (ref 26.0–34.0)
MCHC: 33.2 g/dL (ref 30.0–36.0)
MCV: 89.1 fL (ref 80.0–100.0)
Platelets: 328 10*3/uL (ref 150–400)
RBC: 4.42 MIL/uL (ref 3.87–5.11)
RDW: 11.9 % (ref 11.5–15.5)
WBC: 15.5 10*3/uL — ABNORMAL HIGH (ref 4.0–10.5)
nRBC: 0 % (ref 0.0–0.2)

## 2018-07-13 MED ORDER — LISINOPRIL 5 MG PO TABS: 5.0000 mg | ORAL_TABLET | Freq: Every day | ORAL | Status: DC

## 2018-07-13 MED ORDER — ASPIRIN EC 81 MG PO TBEC: 81.0000 mg | DELAYED_RELEASE_TABLET | Freq: Every day | ORAL | 2 refills | Status: AC

## 2018-07-13 MED ORDER — LOSARTAN POTASSIUM 50 MG PO TABS: 25.0000 mg | ORAL_TABLET | Freq: Every day | ORAL | Status: DC

## 2018-07-13 MED ORDER — LOSARTAN POTASSIUM 25 MG PO TABS: 25.0000 mg | ORAL_TABLET | Freq: Every day | ORAL | 6 refills | Status: DC

## 2018-07-13 MED ORDER — POLYETHYLENE GLYCOL 3350 17 G PO PACK: 17.0000 g | PACK | Freq: Every day | ORAL | 0 refills | Status: DC

## 2018-07-13 MED ORDER — DOCUSATE SODIUM 50 MG PO CAPS
50.0000 mg | ORAL_CAPSULE | Freq: Two times a day (BID) | ORAL | Status: DC | PRN
  Filled 2018-07-13: qty 1

## 2018-07-13 MED ORDER — METOPROLOL TARTRATE 25 MG PO TABS: 12.5000 mg | ORAL_TABLET | Freq: Two times a day (BID) | ORAL | 6 refills | Status: DC

## 2018-07-13 MED ORDER — METOPROLOL TARTRATE 12.5 MG HALF TABLET
12.5000 mg | ORAL_TABLET | Freq: Two times a day (BID) | ORAL | Status: DC
  Administered 2018-07-13: 10:00:00 12.5 mg via ORAL
  Filled 2018-07-13: qty 1

## 2018-07-13 MED ORDER — TICAGRELOR 90 MG PO TABS: 90.0000 mg | ORAL_TABLET | Freq: Two times a day (BID) | ORAL | 11 refills | Status: DC

## 2018-07-13 MED ORDER — NITROGLYCERIN 0.4 MG SL SUBL: 0.4000 mg | SUBLINGUAL_TABLET | SUBLINGUAL | 12 refills | Status: DC | PRN

## 2018-07-13 MED ORDER — ATORVASTATIN CALCIUM 80 MG PO TABS: 80.0000 mg | ORAL_TABLET | Freq: Every day | ORAL | 6 refills | Status: DC

## 2018-07-13 NOTE — Discharge Summary (Addendum)
Discharge Summary    Patient ID: Brenda Cochran MRN: [id number]; DOB: 1950-08-08  Admit date: 07/12/2018 Discharge date: 07/13/2018  Primary Care Provider: Rochel Brome, MD  Primary Cardiologist: Will be Dr. Bettina Gavia at [facility]  Discharge Diagnoses    Principal Problem:   NSTEMI (non-ST elevated myocardial infarction) ([place])   HLD   DM   RA  CAD   Allergies Allergies  Allergen Reactions  . Shellfish Allergy Anaphylaxis  . Contrast Media [Iodinated Diagnostic Agents] Rash  . Metformin And Related Nausea And Vomiting  . Cymbalta [Duloxetine Hcl] Nausea And Vomiting  . Ultram [Tramadol] Nausea And Vomiting  . Oxycontin [Oxycodone Hcl] Palpitations    Diagnostic Studies/Procedures    Coronary Thrombectomy  07/12/18  CORONARY STENT INTERVENTION  LEFT HEART CATH AND CORONARY ANGIOGRAPHY  Conclusion     Ost LAD lesion is 25% stenosed.  Ost 1st Mrg lesion is 50% stenosed.  Prox RCA lesion is 25% stenosed.  The left ventricular systolic function is normal.  LV end diastolic pressure is normal.  The left ventricular ejection fraction is 35-45% by visual estimate.  There is no aortic valve stenosis.  Mid LAD lesion is 95% stenosed.  A drug-eluting stent was successfully placed using a STENT SYNERGY DES 3X24.  Post intervention, there is a 0% residual stenosis.   DAPT for 12 months as noted.  Medical therapy for LV dysfunction.   Repeat echo in 6 weeks.    She needs aggressive secondary prevention.     Diagnostic  Dominance: Left    Intervention       ECho 07/12/2018 IMPRESSIONS    1. The left ventricle has normal systolic function of 02-54%. The cavity size was normal. There is no increased left ventricular wall thickness. Echo evidence of impaired diastolic relaxation.  2. The right ventricle has normal systolic function. The cavity was normal. There is no increase in right ventricular wall thickness.  3. Trivial pericardial effusion.  4. The mitral  valve is normal in structure.  5. The tricuspid valve is normal in structure.  6. The aortic valve is normal in structure.  7. The pulmonic valve was normal in structure.  History of Present Illness     Brenda Cochran is a 68 y.o. female with history of diabetes, hyperlipidemia, asthma, TIA, carotid disease, rheumatoid arthritis presents transferred from [place] early this morning for NSTEMI.  Patient stated her symptoms began around 12 PM 2/7 when she developed central chest pain that felt like someone sitting on her chest, was associated with a shortness of breath and nausea.  The pain radiated down her left arm.  She presented to the emergency room where she was given Nitropaste with improvement of her pain.  Nitropaste was converted to a nitroglycerin infusion and she was bolused with heparin and was pain-free at the time of transfer.    Troponin I elevated at [facility] at 1.31 >> 2.44 >> 2.90. ECG showed T wave inversion in leads V3 and V4 which appear new.   Hospital Course     Consultants: None  She was chest pain-free upon arrival to [facility] on IV heparin and nitro gtt.  Cardiac catheterization showed 95% stenosis mid LAD status post drug-eluting stent placement.  Otherwise mild nonobstructive CAD.  No postprocedure complication.  LVEDP normal.  Echocardiogram showed normal LV function of 55 to 60%, impaired diastolic dysfunction.  No postprocedure complication.  Renal function stable.  LDL 121. She was treated with dual antiplatelet therapy with aspirin  and Brilinta.  Started on low-dose metoprolol 12.5 mg twice daily.  She will start losartan 25 mg tomorrow.  She has been seen by Dr. Margaretann Loveless today and deemed ready for discharge home. All follow-up appointments have been scheduled. Discharge medications are listed below.    Discharge Vitals Blood pressure 132/77, pulse 70, temperature (!) 97.5 F (36.4 C), temperature source Oral, resp. rate 12, height 5\' 5"  (1.651 m),  weight 83.2 kg, SpO2 96 %.  Filed Weights   07/12/18 0100 07/13/18 0534  Weight: 82.9 kg 83.2 kg    Labs & Radiologic Studies    CBC Recent Labs    07/12/18 0333 07/13/18 0720  WBC 7.9 15.5*  HGB 12.8 13.1  HCT 39.6 39.4  MCV 90.6 89.1  PLT 287 629   Basic Metabolic Panel Recent Labs    07/12/18 0333 07/13/18 0720  NA 140 137  K 3.5 3.9  CL 105 107  CO2 24 22  GLUCOSE 125* 273*  BUN 8 14  CREATININE 0.71 0.80  CALCIUM 8.9 8.4*   Cardiac Enzymes Recent Labs    07/12/18 1117  TROPONINI 0.93*   Hemoglobin A1C Recent Labs    07/12/18 0333  HGBA1C 8.1*   Fasting Lipid Panel Recent Labs    07/12/18 0333  CHOL 182  HDL 32*  LDLCALC 121*  TRIG 145  CHOLHDL 5.7   _____________  Dg Chest Port 1 View  Result Date: 07/12/2018 CLINICAL DATA:  Myocardial infarction EXAM: PORTABLE CHEST 1 VIEW COMPARISON:  07/11/2018 FINDINGS: Heart size is normal. Mediastinum is normal except for aortic atherosclerosis. The lungs are clear. The vascularity is normal. No effusions. No acute bone finding. IMPRESSION: No active disease. Electronically Signed   By: Nelson Chimes M.D.   On: 07/12/2018 07:42   Disposition   Pt is being discharged home today in good condition.  Follow-up Plans & Appointments    Follow-up Information    Richardo Priest, MD Follow up.   Specialty:  Cardiology Why:  Office will call you with the time and date of appointment Please call us if you did not heard by Tuesday Contact information: [address] [place] [address] [phone]          Discharge Instructions    AMB Referral to Cardiac Rehabilitation - Phase II   Complete by:  As directed    Diagnosis:   NSTEMI Coronary Stents     Amb Referral to Cardiac Rehabilitation   Complete by:  As directed    Referred To phase II cardiac rehab in [place], [place]   Diagnosis:   Coronary Stents NSTEMI     Diet - low sodium heart healthy   Complete by:  As directed    Discharge  instructions   Complete by:  As directed    NO HEAVY LIFTING (>10lbs) X 2 WEEKS. NO SEXUAL ACTIVITY X 2 WEEKS. NO DRIVING X 1 WEEK. NO SOAKING BATHS, HOT TUBS, POOLS, ETC., X 7 DAYS.   Increase activity slowly   Complete by:  As directed       Discharge Medications   Allergies as of 07/13/2018      Reactions   Shellfish Allergy Anaphylaxis   Contrast Media [iodinated Diagnostic Agents] Rash   Metformin And Related Nausea And Vomiting   Cymbalta [duloxetine Hcl] Nausea And Vomiting   Ultram [tramadol] Nausea And Vomiting   Oxycontin [oxycodone Hcl] Palpitations      Medication List    STOP taking these medications  rosuvastatin 10 MG tablet Commonly known as:  CRESTOR Replaced by:  atorvastatin 80 MG tablet     TAKE these medications   albuterol 108 (90 Base) MCG/ACT inhaler Commonly known as:  PROVENTIL HFA;VENTOLIN HFA Inhale 2 puffs into the lungs every 4 (four) hours as needed for wheezing or shortness of breath. ProAir   aspirin EC 81 MG tablet Take 1 tablet (81 mg total) by mouth daily.   atorvastatin 80 MG tablet Commonly known as:  LIPITOR Take 1 tablet (80 mg total) by mouth daily at 6 PM. Replaces:  rosuvastatin 10 MG tablet   bismuth subsalicylate 366 QH/47ML suspension Commonly known as:  PEPTO BISMOL Take 30 mLs by mouth every 6 (six) hours as needed (for nausea/vomiting).   etodolac 400 MG tablet Commonly known as:  LODINE Take 400 mg by mouth daily.   HYDROcodone-acetaminophen 5-325 MG tablet Commonly known as:  NORCO/VICODIN Take 1 tablet by mouth daily as needed (pain).   leflunomide 20 MG tablet Commonly known as:  ARAVA Take 20 mg by mouth daily.   losartan 25 MG tablet Commonly known as:  COZAAR Take 1 tablet (25 mg total) by mouth daily. Start taking on:  July 14, 2018   metoprolol tartrate 25 MG tablet Commonly known as:  LOPRESSOR Take 0.5 tablets (12.5 mg total) by mouth 2 (two) times daily.   naproxen sodium 220 MG  tablet Commonly known as:  ALEVE Take 440 mg by mouth daily as needed (pain).   nitroGLYCERIN 0.4 MG SL tablet Commonly known as:  NITROSTAT Place 1 tablet (0.4 mg total) under the tongue every 5 (five) minutes x 3 doses as needed for chest pain.   polyethylene glycol packet Commonly known as:  MIRALAX Take 17 g by mouth daily.   predniSONE 5 MG tablet Commonly known as:  DELTASONE Take 5 mg by mouth daily as needed (inflammation from rheumatoid arthritis).   SYMBICORT 160-4.5 MCG/ACT inhaler Generic drug:  budesonide-formoterol Inhale 1 puff into the lungs See admin instructions. Inhale 1 puff into the lungs every morning, may also inhale 1 puff later in the day as needed for shortness of breath   ticagrelor 90 MG Tabs tablet Commonly known as:  BRILINTA Take 1 tablet (90 mg total) by mouth 2 (two) times daily.        Acute coronary syndrome (MI, NSTEMI, STEMI, etc) this admission?: Yes.     AHA/ACC Clinical Performance & Quality Measures: 1. Aspirin prescribed? - Yes 2. ADP Receptor Inhibitor (Plavix/Clopidogrel, Brilinta/Ticagrelor or Effient/Prasugrel) prescribed (includes medically managed patients)? - Yes 3. Beta Blocker prescribed? - Yes 4. High Intensity Statin (Lipitor 40-80mg  or Crestor 20-40mg ) prescribed? - Yes 5. EF assessed during THIS hospitalization? - Yes 6. For EF <40%, was ACEI/ARB prescribed? - Yes 7. For EF <40%, Aldosterone Antagonist (Spironolactone or Eplerenone) prescribed? - Not Applicable (EF >/= 46%) 8. Cardiac Rehab Phase II ordered (Included Medically managed Patients)? - Yes     Outstanding Labs/Studies   Consider OP f/u labs 6-8 weeks given statin initiation this admission. BMET at follow up   Duration of Discharge Encounter   Greater than 30 minutes including physician time.  Signed, Leanor Kail, PA 07/13/2018, 10:50 AM   Physical exam per MD.    ---------------------------------------------------------------------------------------------   History and all data above reviewed.  Patient examined.  I agree with the findings as above.  Brenda Cochran feels well and is ready to go home. She is having some mild asymptomatic facial flushing, no red  flag symptoms for anaphylaxis or angioedema.   Constitutional: No acute distress Cardiovascular: regular rhythm, normal rate, no murmurs. S1 and S2 normal. Radial pulses normal bilaterally. Radial site c/d/i. No jugular venous distention.  Respiratory: clear to auscultation bilaterally GI : normal bowel sounds, soft and nontender. No distention.   MSK: extremities warm, well perfused. No edema.  NEURO: grossly nonfocal exam, moves all extremities. PSYCH: alert and oriented x 3, normal mood and affect.   All available labs, radiology testing, previous records reviewed. Agree with documented assessment and plan of my colleague as stated above with the following additions or changes:  Principal Problem:   NSTEMI (non-ST elevated myocardial infarction) ([place])    Plan: we discussed in detail recommendations for homegoing including exercise, return to work, diet, lifestyle and medications.   I have instructed the patient that dual antiplatelet therapy should be taken for 1 year without interruption.  We have discussed the consequences of interrupted dual antiplatelet therapy and the risk for in-stent thrombosis.   We will arrange f/u in [place] with Dr. Bettina Gavia per patient's request.  Time Spent Directly with Patient:  I have spent a total of 40 minutes with the patient reviewing hospital notes, telemetry, EKGs, labs and examining the patient as well as establishing an assessment and plan that was discussed personally with the patient.  > 50% of time was spent in direct patient care.  Length of Stay:  LOS: 1 day   Elouise Munroe, MD [facility] 11:29 AM  07/13/2018

## 2018-07-13 NOTE — Progress Notes (Signed)
CARDIAC REHAB PHASE I   PRE:  Rate/Rhythm: 80 SR  BP:  Supine:   Sitting: 132/77  Standing:    SaO2: 96% RA  MODE:  Ambulation: 460 ft   POST:  Rate/Rhythm: 103 ST  BP:  Supine:   Sitting: 144/74  Standing:    SaO2: 95% RA Patient tolerated ambulation well independently, she did stop to rest x1, she felt tired, but recovered after 3 minute rest, no other complaints. Education completed with patient and daughter, completed MI causes and restrictions, cath site care and precautions, angina, NTG usage, when to call the [facility], when to call 911, home exercises progression and guidelines, stent card, antiplatelet and aspirin use.  Referred to phase II cardiac Rehab in [place], [place]. [id number]  Liliane Channel RN, BSN 07/13/2018 9:15 AM

## 2018-07-13 NOTE — Care Management (Signed)
Pt will remain inpatient status per Lurena Nida.  RN has Brilinta card to give to patient.

## 2018-07-15 ENCOUNTER — Encounter (HOSPITAL_COMMUNITY): Payer: Self-pay | Admitting: Interventional Cardiology

## 2018-07-15 MED FILL — Nitroglycerin IV Soln 100 MCG/ML in D5W: INTRA_ARTERIAL | Qty: 10 | Status: AC

## 2018-07-18 ENCOUNTER — Encounter: Payer: Self-pay | Admitting: Cardiology

## 2018-07-18 ENCOUNTER — Telehealth: Payer: Self-pay | Admitting: Cardiology

## 2018-07-18 ENCOUNTER — Ambulatory Visit (INDEPENDENT_AMBULATORY_CARE_PROVIDER_SITE_OTHER): Payer: Medicare Other | Admitting: Cardiology

## 2018-07-18 VITALS — BP 122/78 | HR 83 | Ht 65.0 in | Wt 183.0 lb

## 2018-07-18 DIAGNOSIS — E782 Mixed hyperlipidemia: Secondary | ICD-10-CM

## 2018-07-18 DIAGNOSIS — I10 Essential (primary) hypertension: Secondary | ICD-10-CM | POA: Insufficient documentation

## 2018-07-18 DIAGNOSIS — I214 Non-ST elevation (NSTEMI) myocardial infarction: Secondary | ICD-10-CM | POA: Diagnosis not present

## 2018-07-18 DIAGNOSIS — R358 Other polyuria: Secondary | ICD-10-CM | POA: Diagnosis not present

## 2018-07-18 DIAGNOSIS — R0789 Other chest pain: Secondary | ICD-10-CM

## 2018-07-18 DIAGNOSIS — E1121 Type 2 diabetes mellitus with diabetic nephropathy: Secondary | ICD-10-CM | POA: Diagnosis not present

## 2018-07-18 DIAGNOSIS — I25810 Atherosclerosis of coronary artery bypass graft(s) without angina pectoris: Secondary | ICD-10-CM | POA: Diagnosis not present

## 2018-07-18 HISTORY — DX: Essential (primary) hypertension: I10

## 2018-07-18 NOTE — Telephone Encounter (Signed)
Dr Tobie Poet office called and patient has been having chest pain on and off for a few says since her last MI , she is scheduled to see [facility] next Weds and they are concerned she may need to be seen sooner..please call patient

## 2018-07-18 NOTE — Telephone Encounter (Signed)
Patient returned call and is agreeable to scheduled appointment. Address and phone number for the [place] location provided to the patient. Patient will arrive at 3:40 for a 4:00 appointment with Dr. Geraldo Pitter. No further questions. Dr. Bettina Gavia made aware.

## 2018-07-18 NOTE — Patient Instructions (Addendum)
Medication Instructions:   Your physician has recommended you make the following change in your medication:   STOP: Losartan 25 mg until you come back next week to follow up.   If you need a refill on your cardiac medications before your next appointment, please call your pharmacy.   Lab work:  Your physician recommends that you return for lab work today: BMP    If you have labs (blood work) drawn today and your tests are completely normal, you will receive your results only by: Marland Kitchen MyChart Message (if you have MyChart) OR . A paper copy in the mail If you have any lab test that is abnormal or we need to change your treatment, we will call you to review the results.  Testing/Procedures:  NONE  Follow-Up: At [facility], you and your health needs are our priority.  As part of our continuing mission to provide you with exceptional heart care, we have created designated Provider Care Teams.  These Care Teams include your primary Cardiologist (physician) and Advanced Practice Providers (APPs -  Physician Assistants and Nurse Practitioners) who all work together to provide you with the care you need, when you need it.  . You will need a follow up appointment in 1 weeks.

## 2018-07-18 NOTE — Telephone Encounter (Signed)
Dr. Bettina Gavia spoke with Dr. Tobie Poet regarding patient. Patient has been scheduled to see Dr. Geraldo Pitter this afternoon at 4:00 pm in the [place] office to be evaluated as Dr. Geraldo Pitter is DOD. Left message for patient and/or patient's daughter to return call to discuss further.

## 2018-07-18 NOTE — Progress Notes (Signed)
Cardiology Office Note:    Date:  07/18/2018   ID:  Brenda Cochran, DOB [birth year], MRN [id number]  PCP:  Rochel Brome, MD  Cardiologist:  Jenean Lindau, MD   Referring MD: Rochel Brome, MD    ASSESSMENT:    1. Chest discomfort   2. Coronary artery disease involving coronary bypass graft without angina pectoris, unspecified whether native or transplanted heart   3. Mixed hyperlipidemia   4. Essential hypertension    PLAN:    In order of problems listed above:  1. Secondary prevention stressed with the patient.  Importance of compliance with diet and medication stressed and she vocalized understanding.  Blood pressure is stable.  It is on the lower side so have asked her to hold her ARB at this time.  She will have blood work today essentially Chem-7 in view of significant diarrhea that she has had.  The diarrhea has resolved since the past 2 days.  It appears that she has had significant diarrhea. 2. At the time of my evaluation she is alert awake oriented and in no distress.  She has ambulated well at home in the past day or 2 without any chest pain symptoms I feel fairly confident it is very unlikely to be from a coronary etiology.  I encouraged ambulation.  I have held her angiotensin receptor blocker because of borderline blood pressure and fatigue.  I will see her in follow-up appointment in a week or earlier if she has any concerns.  She knows to go to the nearest emergency room for any significant concerns.  Of note is that she has had no significant symptoms this time around resemble her symptoms before her coronary stenting.  I told her that if she has any significant issues or worsening chest symptoms she needs to go to the nearest emergency room.  She knows that being a diabetic her symptoms can be masked.  Time for this evaluation was 20 minutes.   Medication Adjustments/Labs and Tests Ordered: Current medicines are reviewed at length with the patient today.  Concerns  regarding medicines are outlined above.  Orders Placed This Encounter  Procedures  . Basic metabolic panel   No orders of the defined types were placed in this encounter.    No chief complaint on file.    History of Present Illness:    Brenda Cochran is a 68 y.o. female.  The patient recently came to [facility] and was transferred to [facility].  She came with chest tightness and she tells me that she was pretty sure she was having a heart attack because of the nature of the chest tightness.  Indeed she was found to have significant stenosis in her LAD and stented and discharged home.  This was just a few days ago.  Patient mentions to me that subsequently she had has significant diarrhea.  And just did not feel better.  She had some chest discomfort and therefore she went and saw her primary care physician today and she was referred here.  At the time of my evaluation, the patient is alert awake oriented and in no distress.  I had a lengthy discussion with the patient.  The patient felt chest discomfort for like a couple of seconds.  Her main problem is significant fatigue.  EKG done at primary care doctor's office was sent and I compared it with her EKG done at [facility] and it was unremarkable or no significant change for slightly better  at best  Past Medical History:  Diagnosis Date  . Asthma   . COPD (chronic obstructive pulmonary disease) ([place])   . Depression   . Fibromyalgia   . GERD (gastroesophageal reflux disease)   . Hyperlipidemia   . Hypothyroidism (acquired)   . Osteoarthritis   . RA (rheumatoid arthritis) ([place])    [name]  . Skin cancer    face  . Sleep apnea    cpap  . Type 2 diabetes mellitus ([place])     Past Surgical History:  Procedure Laterality Date  . ABDOMINAL HYSTERECTOMY  1984  . BREAST LUMPECTOMY  1976   left, benign  . CHOLECYSTECTOMY  09/05/10  . CORONARY STENT INTERVENTION N/A 07/12/2018   Procedure: CORONARY STENT INTERVENTION;   Surgeon: Jettie Booze, MD;  Location: [place] [facility];  Service: Cardiovascular;  Laterality: N/A;  . CORONARY THROMBECTOMY N/A 07/12/2018   Procedure: Coronary Thrombectomy;  Surgeon: Jettie Booze, MD;  Location: [place] [facility];  Service: Cardiovascular;  Laterality: N/A;  . LEFT HEART CATH AND CORONARY ANGIOGRAPHY N/A 07/12/2018   Procedure: LEFT HEART CATH AND CORONARY ANGIOGRAPHY;  Surgeon: Jettie Booze, MD;  Location: [place] [facility];  Service: Cardiovascular;  Laterality: N/A;    Current Medications: Current Meds  Medication Sig  . albuterol (PROVENTIL HFA;VENTOLIN HFA) 108 (90 Base) MCG/ACT inhaler Inhale 2 puffs into the lungs every 4 (four) hours as needed for wheezing or shortness of breath. ProAir  . aspirin EC 81 MG tablet Take 1 tablet (81 mg total) by mouth daily.  Marland Kitchen atorvastatin (LIPITOR) 80 MG tablet Take 1 tablet (80 mg total) by mouth daily at 6 PM.  . bismuth subsalicylate (PEPTO BISMOL) 262 MG/15ML suspension Take 30 mLs by mouth every 6 (six) hours as needed (for nausea/vomiting).  . budesonide-formoterol (SYMBICORT) 160-4.5 MCG/ACT inhaler Inhale 1 puff into the lungs See admin instructions. Inhale 1 puff into the lungs every morning, may also inhale 1 puff later in the day as needed for shortness of breath  . etodolac (LODINE) 400 MG tablet Take 400 mg by mouth daily.   Marland Kitchen HYDROcodone-acetaminophen (NORCO/VICODIN) 5-325 MG tablet Take 1 tablet by mouth daily as needed (pain).   Marland Kitchen leflunomide (ARAVA) 20 MG tablet Take 20 mg by mouth daily.  Marland Kitchen losartan (COZAAR) 25 MG tablet Take 1 tablet (25 mg total) by mouth daily.  . metoprolol tartrate (LOPRESSOR) 25 MG tablet Take 0.5 tablets (12.5 mg total) by mouth 2 (two) times daily.  . naproxen sodium (ALEVE) 220 MG tablet Take 440 mg by mouth daily as needed (pain).  . nitroGLYCERIN (NITROSTAT) 0.4 MG SL tablet Place 1 tablet (0.4 mg total) under the tongue every 5 (five) minutes x 3 doses as needed  for chest pain.  . ticagrelor (BRILINTA) 90 MG TABS tablet Take 1 tablet (90 mg total) by mouth 2 (two) times daily.     Allergies:   Shellfish allergy; Contrast media [iodinated diagnostic agents]; Metformin and related; Cymbalta [duloxetine hcl]; Ultram [tramadol]; and Oxycontin [oxycodone hcl]   Social History   Socioeconomic History  . Marital status: Married    Spouse name: Not on file  . Number of children: Not on file  . Years of education: Not on file  . Highest education level: Not on file  Occupational History  . Occupation: disabled    Comment: jockey  Social Needs  . Financial resource strain: Not on file  . Food insecurity:    Worry: Not  on file    Inability: Not on file  . Transportation needs:    Medical: Not on file    Non-medical: Not on file  Tobacco Use  . Smoking status: Current Every Day Smoker    Packs/day: 1.00    Years: 30.00    Pack years: 30.00    Types: Cigarettes    Last attempt to quit: 06/04/2010    Years since quitting: 8.1  . Smokeless tobacco: Current User  Substance and Sexual Activity  . Alcohol use: No  . Drug use: No  . Sexual activity: Not on file  Lifestyle  . Physical activity:    Days per week: Not on file    Minutes per session: Not on file  . Stress: Not on file  Relationships  . Social connections:    Talks on phone: Not on file    Gets together: Not on file    Attends religious service: Not on file    Active member of club or organization: Not on file    Attends meetings of clubs or organizations: Not on file    Relationship status: Not on file  Other Topics Concern  . Not on file  Social History Narrative   Lives with Kanoelani Dobies husband.   Disabled     Family History: The patient's family history includes Bone cancer in her father; Breast cancer in her sister; CAD in an other family member; Diabetes in an other family member; Hypertension in an other family member; Lung cancer in her father; Lung disease in her  maternal uncle, maternal uncle, and mother; Lymphoma in her sister; Multiple sclerosis in her brother; Muscular dystrophy in her brother; Seizures in an other family member. There is no history of Colon cancer, Colon polyps, Esophageal cancer, Stomach cancer, or Rectal cancer.  ROS:   Please see the history of present illness.    All other systems reviewed and are negative.  EKGs/Labs/Other Studies Reviewed:    The following studies were reviewed today: I compared to [facility] records, coronary angiography report and EKGs from the hospital and with the primary care doctor's office today.   Recent Labs: 07/13/2018: BUN 14; Creatinine, Ser 0.80; Hemoglobin 13.1; Platelets 328; Potassium 3.9; Sodium 137  Recent Lipid Panel    Component Value Date/Time   CHOL 182 07/12/2018 0333   TRIG 145 07/12/2018 0333   HDL 32 (L) 07/12/2018 0333   CHOLHDL 5.7 07/12/2018 0333   VLDL 29 07/12/2018 0333   LDLCALC 121 (H) 07/12/2018 0333    Physical Exam:    VS:  BP 122/78 (BP Location: Right Arm, Patient Position: Sitting, Cuff Size: Normal)   Pulse 83   Ht 5\' 5"  (1.651 m)   Wt 183 lb (83 kg)   SpO2 94%   BMI 30.45 kg/m     Wt Readings from Last 3 Encounters:  07/18/18 183 lb (83 kg)  07/13/18 183 lb 6.8 oz (83.2 kg)  04/11/18 190 lb (86.2 kg)     GEN: Patient is in no acute distress HEENT: Normal NECK: No JVD; No carotid bruits LYMPHATICS: No lymphadenopathy CARDIAC: Hear sounds regular, 2/6 systolic murmur at the apex. RESPIRATORY:  Clear to auscultation without rales, wheezing or rhonchi  ABDOMEN: Soft, non-tender, non-distended MUSCULOSKELETAL:  No edema; No deformity  SKIN: Warm and dry NEUROLOGIC:  Alert and oriented x 3 PSYCHIATRIC:  Normal affect   Signed, Jenean Lindau, MD  07/18/2018 4:20 PM    [facility]

## 2018-07-19 LAB — BASIC METABOLIC PANEL
BUN/Creatinine Ratio: 15 (ref 12–28)
BUN: 13 mg/dL (ref 8–27)
CO2: 25 mmol/L (ref 20–29)
Calcium: 9.3 mg/dL (ref 8.7–10.3)
Chloride: 104 mmol/L (ref 96–106)
Creatinine, Ser: 0.86 mg/dL (ref 0.57–1.00)
GFR calc Af Amer: 81 mL/min/{1.73_m2} (ref >59)
GFR calc non Af Amer: 71 mL/min/{1.73_m2} (ref >59)
Glucose: 189 mg/dL — ABNORMAL HIGH (ref 65–99)
Potassium: 4.9 mmol/L (ref 3.5–5.2)
Sodium: 143 mmol/L (ref 134–144)

## 2018-07-24 ENCOUNTER — Ambulatory Visit: Payer: Medicare Other | Admitting: Cardiology

## 2018-07-25 ENCOUNTER — Encounter: Payer: Self-pay | Admitting: Cardiology

## 2018-07-25 ENCOUNTER — Ambulatory Visit (INDEPENDENT_AMBULATORY_CARE_PROVIDER_SITE_OTHER): Payer: Medicare Other | Admitting: Cardiology

## 2018-07-25 VITALS — BP 122/68 | HR 96 | Ht 65.0 in | Wt 188.0 lb

## 2018-07-25 DIAGNOSIS — E782 Mixed hyperlipidemia: Secondary | ICD-10-CM

## 2018-07-25 DIAGNOSIS — I2510 Atherosclerotic heart disease of native coronary artery without angina pectoris: Secondary | ICD-10-CM | POA: Insufficient documentation

## 2018-07-25 DIAGNOSIS — G4733 Obstructive sleep apnea (adult) (pediatric): Secondary | ICD-10-CM

## 2018-07-25 DIAGNOSIS — I10 Essential (primary) hypertension: Secondary | ICD-10-CM | POA: Diagnosis not present

## 2018-07-25 HISTORY — DX: Atherosclerotic heart disease of native coronary artery without angina pectoris: I25.10

## 2018-07-25 NOTE — Progress Notes (Signed)
Cardiology Office Note:    Date:  07/25/2018   ID:  Brenda Cochran, DOB [birth year], MRN [id number]  PCP:  Rochel Brome, MD  Cardiologist:  Jenean Lindau, MD   Referring MD: Rochel Brome, MD    ASSESSMENT:    1. Coronary artery disease involving native coronary artery of native heart without angina pectoris   2. Essential hypertension   3. Mixed hyperlipidemia   4. OSA (obstructive sleep apnea)    PLAN:    In order of problems listed above:  1. Injury prevention stressed.  Importance of compliance with diet and medication stressed and she vocalized understanding.  Her blood pressure is stable.  Her activity level is good and she is increasing it in a graded fashion.  She is asymptomatic. 2. She will be seen in follow-up appointment in a month or earlier if she has any concerns at that time she will have fasting blood work.   Medication Adjustments/Labs and Tests Ordered: Current medicines are reviewed at length with the patient today.  Concerns regarding medicines are outlined above.  No orders of the defined types were placed in this encounter.  No orders of the defined types were placed in this encounter.    No chief complaint on file.    History of Present Illness:    Brenda Cochran is a 68 y.o. female.  Patient has known coronary artery disease and underwent coronary and mention for acute coronary syndrome.  Subsequently she has done fine.  I saw her recently after a bout of diarrhea.  This is resolved and now she is walking on a regular basis without any symptoms no chest pain orthopnea or PND and she feels much better  Past Medical History:  Diagnosis Date  . Asthma   . COPD (chronic obstructive pulmonary disease) ([place])   . Depression   . Fibromyalgia   . GERD (gastroesophageal reflux disease)   . Hyperlipidemia   . Hypothyroidism (acquired)   . Osteoarthritis   . RA (rheumatoid arthritis) ([place])    [name]  . Skin cancer    face  . Sleep apnea    cpap  . Type 2 diabetes mellitus ([place])     Past Surgical History:  Procedure Laterality Date  . ABDOMINAL HYSTERECTOMY  1984  . BREAST LUMPECTOMY  1976   left, benign  . CHOLECYSTECTOMY  09/05/10  . CORONARY STENT INTERVENTION N/A 07/12/2018   Procedure: CORONARY STENT INTERVENTION;  Surgeon: Jettie Booze, MD;  Location: [place] [facility];  Service: Cardiovascular;  Laterality: N/A;  . CORONARY THROMBECTOMY N/A 07/12/2018   Procedure: Coronary Thrombectomy;  Surgeon: Jettie Booze, MD;  Location: [place] [facility];  Service: Cardiovascular;  Laterality: N/A;  . LEFT HEART CATH AND CORONARY ANGIOGRAPHY N/A 07/12/2018   Procedure: LEFT HEART CATH AND CORONARY ANGIOGRAPHY;  Surgeon: Jettie Booze, MD;  Location: [place] [facility];  Service: Cardiovascular;  Laterality: N/A;    Current Medications: Current Meds  Medication Sig  . albuterol (PROVENTIL HFA;VENTOLIN HFA) 108 (90 Base) MCG/ACT inhaler Inhale 2 puffs into the lungs every 4 (four) hours as needed for wheezing or shortness of breath. ProAir  . aspirin EC 81 MG tablet Take 1 tablet (81 mg total) by mouth daily.  Marland Kitchen atorvastatin (LIPITOR) 80 MG tablet Take 1 tablet (80 mg total) by mouth daily at 6 PM.  . bismuth subsalicylate (PEPTO BISMOL) 262 MG/15ML suspension Take 30 mLs by mouth every 6 (six) hours as needed (  for nausea/vomiting).  . budesonide-formoterol (SYMBICORT) 160-4.5 MCG/ACT inhaler Inhale 1 puff into the lungs See admin instructions. Inhale 1 puff into the lungs every morning, may also inhale 1 puff later in the day as needed for shortness of breath  . etodolac (LODINE) 400 MG tablet Take 400 mg by mouth daily.   Marland Kitchen HYDROcodone-acetaminophen (NORCO/VICODIN) 5-325 MG tablet Take 1 tablet by mouth daily as needed (pain).   Marland Kitchen leflunomide (ARAVA) 20 MG tablet Take 20 mg by mouth daily.  . metoprolol tartrate (LOPRESSOR) 25 MG tablet Take 0.5 tablets (12.5 mg total) by mouth 2 (two) times daily.  . naproxen  sodium (ALEVE) 220 MG tablet Take 440 mg by mouth daily as needed (pain).  . nitroGLYCERIN (NITROSTAT) 0.4 MG SL tablet Place 1 tablet (0.4 mg total) under the tongue every 5 (five) minutes x 3 doses as needed for chest pain.  . predniSONE (DELTASONE) 5 MG tablet Take 5 mg by mouth daily as needed (inflammation from rheumatoid arthritis).   . ticagrelor (BRILINTA) 90 MG TABS tablet Take 1 tablet (90 mg total) by mouth 2 (two) times daily.     Allergies:   Shellfish allergy; Contrast media [iodinated diagnostic agents]; Metformin and related; Cymbalta [duloxetine hcl]; Ultram [tramadol]; and Oxycontin [oxycodone hcl]   Social History   Socioeconomic History  . Marital status: Married    Spouse name: Not on file  . Number of children: Not on file  . Years of education: Not on file  . Highest education level: Not on file  Occupational History  . Occupation: disabled    Comment: jockey  Social Needs  . Financial resource strain: Not on file  . Food insecurity:    Worry: Not on file    Inability: Not on file  . Transportation needs:    Medical: Not on file    Non-medical: Not on file  Tobacco Use  . Smoking status: Current Every Day Smoker    Packs/day: 1.00    Years: 30.00    Pack years: 30.00    Types: Cigarettes    Last attempt to quit: 06/04/2010    Years since quitting: 8.1  . Smokeless tobacco: Current User  Substance and Sexual Activity  . Alcohol use: No  . Drug use: No  . Sexual activity: Not on file  Lifestyle  . Physical activity:    Days per week: Not on file    Minutes per session: Not on file  . Stress: Not on file  Relationships  . Social connections:    Talks on phone: Not on file    Gets together: Not on file    Attends religious service: Not on file    Active member of club or organization: Not on file    Attends meetings of clubs or organizations: Not on file    Relationship status: Not on file  Other Topics Concern  . Not on file  Social History  Narrative   Lives with Tulani Kidney husband.   Disabled     Family History: The patient's family history includes Bone cancer in her father; Breast cancer in her sister; CAD in an other family member; Diabetes in an other family member; Hypertension in an other family member; Lung cancer in her father; Lung disease in her maternal uncle, maternal uncle, and mother; Lymphoma in her sister; Multiple sclerosis in her brother; Muscular dystrophy in her brother; Seizures in an other family member. There is no history of Colon cancer, Colon polyps, Esophageal cancer,  Stomach cancer, or Rectal cancer.  ROS:   Please see the history of present illness.    All other systems reviewed and are negative.  EKGs/Labs/Other Studies Reviewed:    The following studies were reviewed today: I discussed my findings with the patient and the lab work done during the last visit which was unremarkable   Recent Labs: 07/13/2018: Hemoglobin 13.1; Platelets 328 07/18/2018: BUN 13; Creatinine, Ser 0.86; Potassium 4.9; Sodium 143  Recent Lipid Panel    Component Value Date/Time   CHOL 182 07/12/2018 0333   TRIG 145 07/12/2018 0333   HDL 32 (L) 07/12/2018 0333   CHOLHDL 5.7 07/12/2018 0333   VLDL 29 07/12/2018 0333   LDLCALC 121 (H) 07/12/2018 0333    Physical Exam:    VS:  BP 122/68 (BP Location: Right Arm, Patient Position: Sitting, Cuff Size: Normal)   Pulse 96   Ht 5\' 5"  (1.651 m)   Wt 188 lb (85.3 kg)   SpO2 98%   BMI 31.28 kg/m     Wt Readings from Last 3 Encounters:  07/25/18 188 lb (85.3 kg)  07/18/18 183 lb (83 kg)  07/13/18 183 lb 6.8 oz (83.2 kg)     GEN: Patient is in no acute distress HEENT: Normal NECK: No JVD; No carotid bruits LYMPHATICS: No lymphadenopathy CARDIAC: Hear sounds regular, 2/6 systolic murmur at the apex. RESPIRATORY:  Clear to auscultation without rales, wheezing or rhonchi  ABDOMEN: Soft, non-tender, non-distended MUSCULOSKELETAL:  No edema; No deformity  SKIN: Warm  and dry NEUROLOGIC:  Alert and oriented x 3 PSYCHIATRIC:  Normal affect   Signed, Jenean Lindau, MD  07/25/2018 1:51 PM    [facility]

## 2018-07-25 NOTE — Patient Instructions (Signed)
Medication Instructions:  Your physician recommends that you continue on your current medications as directed. Please refer to the Current Medication list given to you today.  If you need a refill on your cardiac medications before your next appointment, please call your pharmacy.   Lab work: None  If you have labs (blood work) drawn today and your tests are completely normal, you will receive your results only by: Marland Kitchen MyChart Message (if you have MyChart) OR . A paper copy in the mail If you have any lab test that is abnormal or we need to change your treatment, we will call you to review the results.  Testing/Procedures: None  Follow-Up: At [facility], you and your health needs are our priority.  As part of our continuing mission to provide you with exceptional heart care, we have created designated Provider Care Teams.  These Care Teams include your primary Cardiologist (physician) and Advanced Practice Providers (APPs -  Physician Assistants and Nurse Practitioners) who all work together to provide you with the care you need, when you need it. You will need a follow up appointment in 1 months.  Please call our office 2 months in advance to schedule this appointment.  You may see No primary care provider on file. or another member of our [employer] Provider Team in [place]: Jenne Campus, MD . Shirlee More, MD  Any Other Special Instructions Will Be Listed Below (If Applicable).

## 2018-08-06 DIAGNOSIS — Z79899 Other long term (current) drug therapy: Secondary | ICD-10-CM | POA: Diagnosis not present

## 2018-08-06 DIAGNOSIS — Z951 Presence of aortocoronary bypass graft: Secondary | ICD-10-CM | POA: Diagnosis not present

## 2018-08-06 DIAGNOSIS — I2510 Atherosclerotic heart disease of native coronary artery without angina pectoris: Secondary | ICD-10-CM | POA: Diagnosis not present

## 2018-08-06 DIAGNOSIS — Z955 Presence of coronary angioplasty implant and graft: Secondary | ICD-10-CM | POA: Diagnosis not present

## 2018-08-06 DIAGNOSIS — I252 Old myocardial infarction: Secondary | ICD-10-CM | POA: Diagnosis not present

## 2018-08-06 DIAGNOSIS — Z7982 Long term (current) use of aspirin: Secondary | ICD-10-CM | POA: Diagnosis not present

## 2018-08-07 DIAGNOSIS — Z951 Presence of aortocoronary bypass graft: Secondary | ICD-10-CM | POA: Diagnosis not present

## 2018-08-07 DIAGNOSIS — Z79899 Other long term (current) drug therapy: Secondary | ICD-10-CM | POA: Diagnosis not present

## 2018-08-07 DIAGNOSIS — Z955 Presence of coronary angioplasty implant and graft: Secondary | ICD-10-CM | POA: Diagnosis not present

## 2018-08-07 DIAGNOSIS — I252 Old myocardial infarction: Secondary | ICD-10-CM | POA: Diagnosis not present

## 2018-08-07 DIAGNOSIS — Z7982 Long term (current) use of aspirin: Secondary | ICD-10-CM | POA: Diagnosis not present

## 2018-08-07 DIAGNOSIS — I2510 Atherosclerotic heart disease of native coronary artery without angina pectoris: Secondary | ICD-10-CM | POA: Diagnosis not present

## 2018-08-09 DIAGNOSIS — I2510 Atherosclerotic heart disease of native coronary artery without angina pectoris: Secondary | ICD-10-CM | POA: Diagnosis not present

## 2018-08-09 DIAGNOSIS — Z79899 Other long term (current) drug therapy: Secondary | ICD-10-CM | POA: Diagnosis not present

## 2018-08-09 DIAGNOSIS — Z951 Presence of aortocoronary bypass graft: Secondary | ICD-10-CM | POA: Diagnosis not present

## 2018-08-09 DIAGNOSIS — I252 Old myocardial infarction: Secondary | ICD-10-CM | POA: Diagnosis not present

## 2018-08-09 DIAGNOSIS — Z7982 Long term (current) use of aspirin: Secondary | ICD-10-CM | POA: Diagnosis not present

## 2018-08-09 DIAGNOSIS — Z955 Presence of coronary angioplasty implant and graft: Secondary | ICD-10-CM | POA: Diagnosis not present

## 2018-08-12 DIAGNOSIS — I2510 Atherosclerotic heart disease of native coronary artery without angina pectoris: Secondary | ICD-10-CM | POA: Diagnosis not present

## 2018-08-12 DIAGNOSIS — Z955 Presence of coronary angioplasty implant and graft: Secondary | ICD-10-CM | POA: Diagnosis not present

## 2018-08-12 DIAGNOSIS — Z79899 Other long term (current) drug therapy: Secondary | ICD-10-CM | POA: Diagnosis not present

## 2018-08-12 DIAGNOSIS — I252 Old myocardial infarction: Secondary | ICD-10-CM | POA: Diagnosis not present

## 2018-08-12 DIAGNOSIS — Z951 Presence of aortocoronary bypass graft: Secondary | ICD-10-CM | POA: Diagnosis not present

## 2018-08-12 DIAGNOSIS — Z7982 Long term (current) use of aspirin: Secondary | ICD-10-CM | POA: Diagnosis not present

## 2018-08-14 DIAGNOSIS — Z955 Presence of coronary angioplasty implant and graft: Secondary | ICD-10-CM | POA: Diagnosis not present

## 2018-08-14 DIAGNOSIS — I2510 Atherosclerotic heart disease of native coronary artery without angina pectoris: Secondary | ICD-10-CM | POA: Diagnosis not present

## 2018-08-14 DIAGNOSIS — Z7982 Long term (current) use of aspirin: Secondary | ICD-10-CM | POA: Diagnosis not present

## 2018-08-14 DIAGNOSIS — I252 Old myocardial infarction: Secondary | ICD-10-CM | POA: Diagnosis not present

## 2018-08-14 DIAGNOSIS — Z79899 Other long term (current) drug therapy: Secondary | ICD-10-CM | POA: Diagnosis not present

## 2018-08-14 DIAGNOSIS — R51 Headache: Secondary | ICD-10-CM | POA: Diagnosis not present

## 2018-08-14 DIAGNOSIS — I119 Hypertensive heart disease without heart failure: Secondary | ICD-10-CM | POA: Diagnosis not present

## 2018-08-14 DIAGNOSIS — E1169 Type 2 diabetes mellitus with other specified complication: Secondary | ICD-10-CM | POA: Diagnosis not present

## 2018-08-14 DIAGNOSIS — Z951 Presence of aortocoronary bypass graft: Secondary | ICD-10-CM | POA: Diagnosis not present

## 2018-08-19 DIAGNOSIS — I252 Old myocardial infarction: Secondary | ICD-10-CM | POA: Diagnosis not present

## 2018-08-19 DIAGNOSIS — Z955 Presence of coronary angioplasty implant and graft: Secondary | ICD-10-CM | POA: Diagnosis not present

## 2018-08-19 DIAGNOSIS — I2510 Atherosclerotic heart disease of native coronary artery without angina pectoris: Secondary | ICD-10-CM | POA: Diagnosis not present

## 2018-08-19 DIAGNOSIS — Z7982 Long term (current) use of aspirin: Secondary | ICD-10-CM | POA: Diagnosis not present

## 2018-08-19 DIAGNOSIS — Z951 Presence of aortocoronary bypass graft: Secondary | ICD-10-CM | POA: Diagnosis not present

## 2018-08-19 DIAGNOSIS — Z79899 Other long term (current) drug therapy: Secondary | ICD-10-CM | POA: Diagnosis not present

## 2018-08-21 DIAGNOSIS — K219 Gastro-esophageal reflux disease without esophagitis: Secondary | ICD-10-CM | POA: Diagnosis not present

## 2018-08-21 DIAGNOSIS — I119 Hypertensive heart disease without heart failure: Secondary | ICD-10-CM | POA: Diagnosis not present

## 2018-08-21 DIAGNOSIS — R51 Headache: Secondary | ICD-10-CM | POA: Diagnosis not present

## 2018-08-23 ENCOUNTER — Encounter: Payer: Self-pay | Admitting: Cardiology

## 2018-08-23 ENCOUNTER — Ambulatory Visit (INDEPENDENT_AMBULATORY_CARE_PROVIDER_SITE_OTHER): Payer: Medicare Other | Admitting: Cardiology

## 2018-08-23 ENCOUNTER — Other Ambulatory Visit: Payer: Self-pay

## 2018-08-23 VITALS — BP 118/60 | HR 88 | Ht 65.0 in | Wt 181.0 lb

## 2018-08-23 DIAGNOSIS — Z01810 Encounter for preprocedural cardiovascular examination: Secondary | ICD-10-CM | POA: Diagnosis not present

## 2018-08-23 DIAGNOSIS — E1169 Type 2 diabetes mellitus with other specified complication: Secondary | ICD-10-CM | POA: Insufficient documentation

## 2018-08-23 DIAGNOSIS — I10 Essential (primary) hypertension: Secondary | ICD-10-CM

## 2018-08-23 DIAGNOSIS — G4733 Obstructive sleep apnea (adult) (pediatric): Secondary | ICD-10-CM

## 2018-08-23 DIAGNOSIS — E088 Diabetes mellitus due to underlying condition with unspecified complications: Secondary | ICD-10-CM | POA: Diagnosis not present

## 2018-08-23 DIAGNOSIS — E785 Hyperlipidemia, unspecified: Secondary | ICD-10-CM | POA: Insufficient documentation

## 2018-08-23 DIAGNOSIS — I2510 Atherosclerotic heart disease of native coronary artery without angina pectoris: Secondary | ICD-10-CM

## 2018-08-23 HISTORY — DX: Type 2 diabetes mellitus with other specified complication: E11.69

## 2018-08-23 NOTE — Addendum Note (Signed)
Addended by: Tarri Glenn on: 08/23/2018 09:07 AM   Modules accepted: Orders

## 2018-08-23 NOTE — Progress Notes (Signed)
Cardiology Office Note:    Date:  08/23/2018   ID:  Brenda Cochran, DOB [birth year], MRN [id number]  PCP:  Rochel Brome, MD  Cardiologist:  Jenean Lindau, MD   Referring MD: Rochel Brome, MD    ASSESSMENT:    1. Preop cardiovascular exam   2. Essential hypertension   3. OSA (obstructive sleep apnea)   4. Diabetes mellitus due to underlying condition with unspecified complications ([place])   5. Coronary artery disease involving native coronary artery of native heart without angina pectoris    PLAN:    In order of problems listed above:  1. Secondary prevention stressed with the patient.  Importance of compliance with diet and medication stressed and she vocalized understanding.  Her blood pressure is stable.  Diet was discussed for dyslipidemia and diabetes mellitus and she is fasting today so we will do lipid check. 2. Patient wants to undergo surgery and she has been told that it is urgent surgery.  She mentions to me that she will start walking at least a mile on a regular basis beginning today.  I told her that if she can walk a mile briskly in about 20 minutes or so then she is not at high risk for coronary events during the aforementioned surgery.  Meticulous hemodynamic monitoring will further reduce the risk of coronary events.  I also discussed and offered her the possibility of stress testing and she is vehemently against it and I respect her wishes.  Her surgeon has mentioned to her that she can undergo the surgery on dual antiplatelet therapy without interruption.  Of course for obvious reasons her antiplatelet agents aspirin and Brilinta cannot be withheld because of recent stenting and she understands this.  Her physicians will give [place] a call if there are any questions in her management. 3. Patient will be seen in follow-up appointment in 3 months or earlier if the patient has any concerns    Medication Adjustments/Labs and Tests Ordered: Current medicines are reviewed at  length with the patient today.  Concerns regarding medicines are outlined above.  No orders of the defined types were placed in this encounter.  No orders of the defined types were placed in this encounter.    No chief complaint on file.    History of Present Illness:    Brenda Cochran is a 68 y.o. female.  Patient has known coronary artery disease and couple of months ago she underwent coronary stenting.  She has essential hypertension and diabetes mellitus.  She denies any problems at this time and takes care of activities of daily living.  No chest pain orthopnea or PND.  She is walking some on a regular basis and with this she has no symptoms.  She mentions to me of occasional chest discomfort not related to exertion.  She mentions to me that the symptoms are completely different than the symptoms she had before the stenting.  At the time of my evaluation, the patient is alert awake oriented and in no distress.  Past Medical History:  Diagnosis Date   Asthma    COPD (chronic obstructive pulmonary disease) (HCC)    Depression    Fibromyalgia    GERD (gastroesophageal reflux disease)    Hyperlipidemia    Hypothyroidism (acquired)    Osteoarthritis    RA (rheumatoid arthritis) ([place])    [name]   Skin cancer    face   Sleep apnea    cpap   Type 2 diabetes  [facility])     Past Surgical History:  Procedure Laterality Date   ABDOMINAL HYSTERECTOMY  1984   BREAST LUMPECTOMY  1976   left, benign   CHOLECYSTECTOMY  09/05/10   CORONARY STENT INTERVENTION N/A 07/12/2018   Procedure: CORONARY STENT INTERVENTION;  Surgeon: Jettie Booze, MD;  Location: [place] [facility];  Service: Cardiovascular;  Laterality: N/A;   CORONARY THROMBECTOMY N/A 07/12/2018   Procedure: Coronary Thrombectomy;  Surgeon: Jettie Booze, MD;  Location: [place] [facility];  Service: Cardiovascular;  Laterality: N/A;   LEFT HEART CATH AND CORONARY ANGIOGRAPHY N/A 07/12/2018    Procedure: LEFT HEART CATH AND CORONARY ANGIOGRAPHY;  Surgeon: Jettie Booze, MD;  Location: [place] [facility];  Service: Cardiovascular;  Laterality: N/A;    Current Medications: Current Meds  Medication Sig   albuterol (PROVENTIL HFA;VENTOLIN HFA) 108 (90 Base) MCG/ACT inhaler Inhale 2 puffs into the lungs every 4 (four) hours as needed for wheezing or shortness of breath. ProAir   aspirin EC 81 MG tablet Take 1 tablet (81 mg total) by mouth daily.   atorvastatin (LIPITOR) 80 MG tablet Take 1 tablet (80 mg total) by mouth daily at 6 PM.   bismuth subsalicylate (PEPTO BISMOL) 262 MG/15ML suspension Take 30 mLs by mouth every 6 (six) hours as needed (for nausea/vomiting).   budesonide-formoterol (SYMBICORT) 160-4.5 MCG/ACT inhaler Inhale 1 puff into the lungs See admin instructions. Inhale 1 puff into the lungs every morning, may also inhale 1 puff later in the day as needed for shortness of breath   etodolac (LODINE) 400 MG tablet Take 400 mg by mouth daily.    HYDROcodone-acetaminophen (NORCO/VICODIN) 5-325 MG tablet Take 1 tablet by mouth daily as needed (pain).    Insulin Glargine (TOUJEO MAX SOLOSTAR [place]) Inject into the skin.   leflunomide (ARAVA) 20 MG tablet Take 20 mg by mouth daily.   metoprolol tartrate (LOPRESSOR) 25 MG tablet Take 0.5 tablets (12.5 mg total) by mouth 2 (two) times daily.   naproxen sodium (ALEVE) 220 MG tablet Take 440 mg by mouth daily as needed (pain).   nitroGLYCERIN (NITROSTAT) 0.4 MG SL tablet Place 1 tablet (0.4 mg total) under the tongue every 5 (five) minutes x 3 doses as needed for chest pain.   predniSONE (DELTASONE) 50 MG tablet Take 1 tablet by mouth daily.   Semaglutide,0.25 or 0.5MG /DOS, (OZEMPIC, 0.25 OR 0.5 MG/DOSE,) 2 MG/1.5ML SOPN Inject into the skin.   ticagrelor (BRILINTA) 90 MG TABS tablet Take 1 tablet (90 mg total) by mouth 2 (two) times daily.     Allergies:   Shellfish allergy; Contrast media [iodinated diagnostic  agents]; Metformin and related; Cymbalta [duloxetine hcl]; Ultram [tramadol]; and Oxycontin [oxycodone hcl]   Social History   Socioeconomic History   Marital status: Married    Spouse name: Not on file   Number of children: Not on file   Years of education: Not on file   Highest education level: Not on file  Occupational History   Occupation: disabled    Comment: jockey  Scientist, product/process development strain: Not on file   Food insecurity:    Worry: Not on file    Inability: Not on file   Transportation needs:    Medical: Not on file    Non-medical: Not on file  Tobacco Use   Smoking status: Current Every Day Smoker    Packs/day: 1.00    Years: 30.00    Pack years: 30.00  Types: Cigarettes    Last attempt to quit: 06/04/2010    Years since quitting: 8.2   Smokeless tobacco: Current User  Substance and Sexual Activity   Alcohol use: No   Drug use: No   Sexual activity: Not on file  Lifestyle   Physical activity:    Days per week: Not on file    Minutes per session: Not on file   Stress: Not on file  Relationships   Social connections:    Talks on phone: Not on file    Gets together: Not on file    Attends religious service: Not on file    Active member of club or organization: Not on file    Attends meetings of clubs or organizations: Not on file    Relationship status: Not on file  Other Topics Concern   Not on file  Social History Narrative   Lives with Suzana Sohail husband.   Disabled     Family History: The patient's family history includes Bone cancer in her father; Breast cancer in her sister; CAD in an other family member; Diabetes in an other family member; Hypertension in an other family member; Lung cancer in her father; Lung disease in her maternal uncle, maternal uncle, and mother; Lymphoma in her sister; Multiple sclerosis in her brother; Muscular dystrophy in her brother; Seizures in an other family member. There is no history of  Colon cancer, Colon polyps, Esophageal cancer, Stomach cancer, or Rectal cancer.  ROS:   Please see the history of present illness.    All other systems reviewed and are negative.  EKGs/Labs/Other Studies Reviewed:    The following studies were reviewed today: I discussed my findings with the patient at extensive length.   Recent Labs: 07/13/2018: Hemoglobin 13.1; Platelets 328 07/18/2018: BUN 13; Creatinine, Ser 0.86; Potassium 4.9; Sodium 143  Recent Lipid Panel    Component Value Date/Time   CHOL 182 07/12/2018 0333   TRIG 145 07/12/2018 0333   HDL 32 (L) 07/12/2018 0333   CHOLHDL 5.7 07/12/2018 0333   VLDL 29 07/12/2018 0333   LDLCALC 121 (H) 07/12/2018 0333    Physical Exam:    VS:  BP 118/60 (BP Location: Right Arm, Patient Position: Sitting, Cuff Size: Normal)    Pulse 88    Ht 5\' 5"  (1.651 m)    Wt 181 lb (82.1 kg)    SpO2 98%    BMI 30.12 kg/m     Wt Readings from Last 3 Encounters:  08/23/18 181 lb (82.1 kg)  07/25/18 188 lb (85.3 kg)  07/18/18 183 lb (83 kg)     GEN: Patient is in no acute distress HEENT: Normal NECK: No JVD; No carotid bruits LYMPHATICS: No lymphadenopathy CARDIAC: Hear sounds regular, 2/6 systolic murmur at the apex. RESPIRATORY:  Clear to auscultation without rales, wheezing or rhonchi  ABDOMEN: Soft, non-tender, non-distended MUSCULOSKELETAL:  No edema; No deformity  SKIN: Warm and dry NEUROLOGIC:  Alert and oriented x 3 PSYCHIATRIC:  Normal affect   Signed, Jenean Lindau, MD  08/23/2018 9:00 AM    [facility]

## 2018-08-23 NOTE — Patient Instructions (Signed)
Medication Instructions:  Your physician recommends that you continue on your current medications as directed. Please refer to the Current Medication list given to you today. If you need a refill on your cardiac medications before your next appointment, please call your pharmacy.   Lab work: Your physician recommends that you return for lab work in: BMP,LFT and Lipids  If you have labs (blood work) drawn today and your tests are completely normal, you will receive your results only by: Marland Kitchen MyChart Message (if you have MyChart) OR . A paper copy in the mail If you have any lab test that is abnormal or we need to change your treatment, we will call you to review the results.  Testing/Procedures: None  Follow-Up: At [facility], you and your health needs are our priority.  As part of our continuing mission to provide you with exceptional heart care, we have created designated Provider Care Teams.  These Care Teams include your primary Cardiologist (physician) and Advanced Practice Providers (APPs -  Physician Assistants and Nurse Practitioners) who all work together to provide you with the care you need, when you need it. You will need a follow up appointment in 3 months.  Please call our office 2 months in advance to schedule this appointment.  You may see No primary care provider on file. or another member of our [employer] Provider Team in [place]: Jenne Campus, MD . Shirlee More, MD  Any Other Special Instructions Will Be Listed Below (If Applicable).

## 2018-08-24 LAB — LIPID PANEL
Chol/HDL Ratio: 3.8 ratio (ref 0.0–4.4)
Cholesterol, Total: 153 mg/dL (ref 100–199)
HDL: 40 mg/dL (ref >39)
LDL Calculated: 77 mg/dL (ref 0–99)
Triglycerides: 180 mg/dL — ABNORMAL HIGH (ref 0–149)
VLDL Cholesterol Cal: 36 mg/dL (ref 5–40)

## 2018-08-24 LAB — BASIC METABOLIC PANEL
BUN/Creatinine Ratio: 11 — ABNORMAL LOW (ref 12–28)
BUN: 8 mg/dL (ref 8–27)
CO2: 23 mmol/L (ref 20–29)
Calcium: 9.1 mg/dL (ref 8.7–10.3)
Chloride: 102 mmol/L (ref 96–106)
Creatinine, Ser: 0.73 mg/dL (ref 0.57–1.00)
GFR calc Af Amer: 99 mL/min/{1.73_m2} (ref >59)
GFR calc non Af Amer: 86 mL/min/{1.73_m2} (ref >59)
Glucose: 178 mg/dL — ABNORMAL HIGH (ref 65–99)
Potassium: 4.4 mmol/L (ref 3.5–5.2)
Sodium: 144 mmol/L (ref 134–144)

## 2018-08-24 LAB — HEPATIC FUNCTION PANEL
ALT: 21 IU/L (ref 0–32)
AST: 16 IU/L (ref 0–40)
Albumin: 4.1 g/dL (ref 3.8–4.8)
Alkaline Phosphatase: 146 IU/L — ABNORMAL HIGH (ref 39–117)
Bilirubin Total: 0.5 mg/dL (ref 0.0–1.2)
Bilirubin, Direct: 0.13 mg/dL (ref 0.00–0.40)
Total Protein: 6.6 g/dL (ref 6.0–8.5)

## 2018-08-26 ENCOUNTER — Telehealth: Payer: Self-pay

## 2018-08-26 NOTE — Telephone Encounter (Signed)
-----   Message from Jenean Lindau, MD sent at 08/26/2018 11:18 AM EDT ----- The results of the study is unremarkable. Please inform patient. I will discuss in detail at next appointment. Cc  primary care/referring physician Jenean Lindau, MD 08/26/2018 11:17 AM

## 2018-08-26 NOTE — Telephone Encounter (Signed)
Patient called and notified of lab results. 

## 2018-08-27 DIAGNOSIS — Z87891 Personal history of nicotine dependence: Secondary | ICD-10-CM | POA: Diagnosis not present

## 2018-08-27 DIAGNOSIS — E119 Type 2 diabetes mellitus without complications: Secondary | ICD-10-CM | POA: Diagnosis not present

## 2018-08-27 DIAGNOSIS — Z79899 Other long term (current) drug therapy: Secondary | ICD-10-CM | POA: Diagnosis not present

## 2018-08-27 DIAGNOSIS — Z955 Presence of coronary angioplasty implant and graft: Secondary | ICD-10-CM | POA: Diagnosis not present

## 2018-08-27 DIAGNOSIS — Z8673 Personal history of transient ischemic attack (TIA), and cerebral infarction without residual deficits: Secondary | ICD-10-CM | POA: Diagnosis not present

## 2018-08-27 DIAGNOSIS — R51 Headache: Secondary | ICD-10-CM | POA: Diagnosis not present

## 2018-08-27 DIAGNOSIS — Z01818 Encounter for other preprocedural examination: Secondary | ICD-10-CM | POA: Diagnosis not present

## 2018-08-27 DIAGNOSIS — E039 Hypothyroidism, unspecified: Secondary | ICD-10-CM | POA: Diagnosis not present

## 2018-08-27 DIAGNOSIS — G4489 Other headache syndrome: Secondary | ICD-10-CM | POA: Diagnosis not present

## 2018-08-27 DIAGNOSIS — E669 Obesity, unspecified: Secondary | ICD-10-CM | POA: Diagnosis not present

## 2018-08-27 DIAGNOSIS — I110 Hypertensive heart disease with heart failure: Secondary | ICD-10-CM | POA: Diagnosis not present

## 2018-08-27 DIAGNOSIS — Z794 Long term (current) use of insulin: Secondary | ICD-10-CM | POA: Diagnosis not present

## 2018-08-27 DIAGNOSIS — I5032 Chronic diastolic (congestive) heart failure: Secondary | ICD-10-CM | POA: Diagnosis not present

## 2018-08-27 DIAGNOSIS — Z7982 Long term (current) use of aspirin: Secondary | ICD-10-CM | POA: Diagnosis not present

## 2018-08-27 DIAGNOSIS — M069 Rheumatoid arthritis, unspecified: Secondary | ICD-10-CM | POA: Diagnosis not present

## 2018-08-27 DIAGNOSIS — E785 Hyperlipidemia, unspecified: Secondary | ICD-10-CM | POA: Diagnosis not present

## 2018-08-27 DIAGNOSIS — I2510 Atherosclerotic heart disease of native coronary artery without angina pectoris: Secondary | ICD-10-CM | POA: Diagnosis not present

## 2018-08-27 DIAGNOSIS — I672 Cerebral atherosclerosis: Secondary | ICD-10-CM | POA: Diagnosis not present

## 2018-08-27 DIAGNOSIS — Z6831 Body mass index (BMI) 31.0-31.9, adult: Secondary | ICD-10-CM | POA: Diagnosis not present

## 2018-08-27 DIAGNOSIS — I252 Old myocardial infarction: Secondary | ICD-10-CM | POA: Diagnosis not present

## 2018-08-27 DIAGNOSIS — K219 Gastro-esophageal reflux disease without esophagitis: Secondary | ICD-10-CM | POA: Diagnosis not present

## 2018-09-03 DIAGNOSIS — K610 Anal abscess: Secondary | ICD-10-CM | POA: Diagnosis not present

## 2018-09-23 ENCOUNTER — Telehealth: Payer: Self-pay | Admitting: Cardiology

## 2018-09-23 ENCOUNTER — Telehealth (INDEPENDENT_AMBULATORY_CARE_PROVIDER_SITE_OTHER): Payer: Medicare Other | Admitting: Cardiology

## 2018-09-23 ENCOUNTER — Other Ambulatory Visit: Payer: Self-pay

## 2018-09-23 ENCOUNTER — Encounter: Payer: Self-pay | Admitting: Cardiology

## 2018-09-23 VITALS — Ht 65.0 in | Wt 183.0 lb

## 2018-09-23 DIAGNOSIS — I10 Essential (primary) hypertension: Secondary | ICD-10-CM | POA: Diagnosis not present

## 2018-09-23 DIAGNOSIS — E088 Diabetes mellitus due to underlying condition with unspecified complications: Secondary | ICD-10-CM

## 2018-09-23 DIAGNOSIS — I2510 Atherosclerotic heart disease of native coronary artery without angina pectoris: Secondary | ICD-10-CM

## 2018-09-23 HISTORY — DX: Atherosclerotic heart disease of native coronary artery without angina pectoris: I25.10

## 2018-09-23 NOTE — Telephone Encounter (Signed)
Patient wanted a call but would not say why

## 2018-09-23 NOTE — Telephone Encounter (Signed)
Patient had heart attack back in Feb.. She recently having pain in right arm, intermittent chest pain and dizziness. RN discussed nitroglycerin administration but patient would like to be worked into todays schedule to talk one-one with Dr.RRR. Patient verbally agrees to telemedicine visit and does not have vital equipment at home. No further questions at this time.

## 2018-09-23 NOTE — Progress Notes (Signed)
Virtual Visit via Telephone Note   This visit type was conducted due to national recommendations for restrictions regarding the COVID-19 Pandemic (e.g. social distancing) in an effort to limit this patient's exposure and mitigate transmission in our community.  Due to her co-morbid illnesses, this patient is at least at moderate risk for complications without adequate follow up.  This format is felt to be most appropriate for this patient at this time.  The patient did not have access to video technology/had technical difficulties with video requiring transitioning to audio format only (telephone).  All issues noted in this document were discussed and addressed.  No physical exam could be performed with this format.  Please refer to the patient's chart for her  consent to telehealth for [facility]   Evaluation Performed:  Follow-up visit  Date:  09/23/2018   ID:  Brenda, Cochran 1951/02/05, MRN [id number]  Patient Location: Home Provider Location: Home  PCP:  Rochel Brome, MD  Cardiologist:  No primary care provider on file.  Electrophysiologist:  None   Chief Complaint:.  Coronary artery disease follow-up  History of Present Illness:    Brenda Cochran is a 68 y.o. female with past medical history of coronary angiography and stenting, essential hypertension.  She mentions to me that she is feeling fine overall.  Occasionally she has chest tightness on a scale of about 2/10.  She has never used nitroglycerin.  No orthopnea or PND.  At the time of my evaluation, the patient is alert awake oriented and in no distress.  The patient does not have symptoms concerning for COVID-19 infection (fever, chills, cough, or new shortness of breath).    Past Medical History:  Diagnosis Date  . Asthma   . COPD (chronic obstructive pulmonary disease) ([place])   . Depression   . Fibromyalgia   . GERD (gastroesophageal reflux disease)   . Hyperlipidemia   . Hypothyroidism (acquired)   .  Osteoarthritis   . RA (rheumatoid arthritis) ([place])    Brenda Cochran  . Skin cancer    face  . Sleep apnea    cpap  . Type 2 diabetes mellitus ([place])    Past Surgical History:  Procedure Laterality Date  . ABDOMINAL HYSTERECTOMY  1984  . BREAST LUMPECTOMY  1976   left, benign  . CHOLECYSTECTOMY  09/05/10  . CORONARY STENT INTERVENTION N/A 07/12/2018   Procedure: CORONARY STENT INTERVENTION;  Surgeon: Jettie Booze, MD;  Location: [place] [facility];  Service: Cardiovascular;  Laterality: N/A;  . CORONARY THROMBECTOMY N/A 07/12/2018   Procedure: Coronary Thrombectomy;  Surgeon: Jettie Booze, MD;  Location: [place] [facility];  Service: Cardiovascular;  Laterality: N/A;  . LEFT HEART CATH AND CORONARY ANGIOGRAPHY N/A 07/12/2018   Procedure: LEFT HEART CATH AND CORONARY ANGIOGRAPHY;  Surgeon: Jettie Booze, MD;  Location: [place] [facility];  Service: Cardiovascular;  Laterality: N/A;     Current Meds  Medication Sig  . aspirin EC 81 MG tablet Take 1 tablet (81 mg total) by mouth daily.  Marland Kitchen atorvastatin (LIPITOR) 80 MG tablet Take 1 tablet (80 mg total) by mouth daily at 6 PM.  . bismuth subsalicylate (PEPTO BISMOL) 262 MG/15ML suspension Take 30 mLs by mouth every 6 (six) hours as needed (for nausea/vomiting).  . budesonide-formoterol (SYMBICORT) 160-4.5 MCG/ACT inhaler Inhale 1 puff into the lungs See admin instructions. Inhale 1 puff into the lungs every morning, may also inhale 1 puff later in the day as  needed for shortness of breath  . etodolac (LODINE) 400 MG tablet Take 400 mg by mouth daily.   Marland Kitchen HYDROcodone-acetaminophen (NORCO/VICODIN) 5-325 MG tablet Take 1 tablet by mouth daily as needed (pain).   . Insulin Glargine (TOUJEO MAX SOLOSTAR [place]) Inject into the skin.  Marland Kitchen leflunomide (ARAVA) 20 MG tablet Take 20 mg by mouth daily.  . metoprolol tartrate (LOPRESSOR) 25 MG tablet Take 0.5 tablets (12.5 mg total) by mouth 2 (two) times daily.  . nitroGLYCERIN (NITROSTAT)  0.4 MG SL tablet Place 1 tablet (0.4 mg total) under the tongue every 5 (five) minutes x 3 doses as needed for chest pain.  . predniSONE (DELTASONE) 5 MG tablet Take 1 tablet by mouth daily.   . Semaglutide,0.25 or 0.5MG /DOS, (OZEMPIC, 0.25 OR 0.5 MG/DOSE,) 2 MG/1.5ML SOPN Inject into the skin.  Marland Kitchen ticagrelor (BRILINTA) 90 MG TABS tablet Take 1 tablet (90 mg total) by mouth 2 (two) times daily.     Allergies:   Shellfish allergy; Contrast media [iodinated diagnostic agents]; Metformin and related; Cymbalta [duloxetine hcl]; Ultram [tramadol]; and Oxycontin [oxycodone hcl]   Social History   Tobacco Use  . Smoking status: Current Every Day Smoker    Packs/day: 1.00    Years: 30.00    Pack years: 30.00    Types: Cigarettes    Last attempt to quit: 06/04/2010    Years since quitting: 8.3  . Smokeless tobacco: Current User  Substance Use Topics  . Alcohol use: No  . Drug use: No     Family Hx: The patient's family history includes Bone cancer in her father; Breast cancer in her sister; CAD in an other family member; Diabetes in an other family member; Hypertension in an other family member; Lung cancer in her father; Lung disease in her maternal uncle, maternal uncle, and mother; Lymphoma in her sister; Multiple sclerosis in her brother; Muscular dystrophy in her brother; Seizures in an other family member. There is no history of Colon cancer, Colon polyps, Esophageal cancer, Stomach cancer, or Rectal cancer.  ROS:   Please see the history of present illness.    Again patient complains of chest tightness very occasionally.  This does not occur with exertion. All other systems reviewed and are negative.   Prior CV studies:   The following studies were reviewed today:  I reviewed coronary angiography report with the patient at length.  Labs/Other Tests and Data Reviewed:    EKG:  No ECG reviewed.  Recent Labs: 07/13/2018: Hemoglobin 13.1; Platelets 328 08/23/2018: ALT 21; BUN 8;  Creatinine, Ser 0.73; Potassium 4.4; Sodium 144   Recent Lipid Panel Lab Results  Component Value Date/Time   CHOL 153 08/23/2018 09:12 AM   TRIG 180 (H) 08/23/2018 09:12 AM   HDL 40 08/23/2018 09:12 AM   CHOLHDL 3.8 08/23/2018 09:12 AM   CHOLHDL 5.7 07/12/2018 03:33 AM   LDLCALC 77 08/23/2018 09:12 AM    Wt Readings from Last 3 Encounters:  09/23/18 183 lb (83 kg)  08/23/18 181 lb (82.1 kg)  07/25/18 188 lb (85.3 kg)     Objective:    Vital Signs:  Ht 5\' 5"  (1.651 m)   Wt 183 lb (83 kg)   BMI 30.45 kg/m    VITAL SIGNS:  reviewed Patient did not record any vital signs.  ASSESSMENT & PLAN:    1. Coronary artery disease: Appears stable at this time.  Patient has episodes of chest tightness which may represent angina pectoris.  If this appears  stable.  I told the patient to use sublingual nitroglycerin and let me know about the follow-up.  If this is really bad she knows to go to the nearest emergency room. 2. Essential hypertension: Patient will keep a track of her pulse and blood pressure.  I will see her in a tele-visit appointment in a week.  At that time I might start her on isosorbide mononitrate depending on how her symptom profile follows 3. She knows to go to the nearest emergency room for any significant symptoms.  COVID-19 Education: The signs and symptoms of COVID-19 were discussed with the patient and how to seek care for testing (follow up with PCP or arrange E-visit).  The importance of social distancing was discussed today.  Time:   Today, I have spent 14 minutes with the patient with telehealth technology discussing the above problems.     Medication Adjustments/Labs and Tests Ordered: Current medicines are reviewed at length with the patient today.  Concerns regarding medicines are outlined above.   Tests Ordered: No orders of the defined types were placed in this encounter.   Medication Changes: No orders of the defined types were placed in this  encounter.   Disposition:  Follow up in 1 week(s)  Signed, Jenean Lindau, MD  09/23/2018 4:59 PM    [facility]

## 2018-09-24 ENCOUNTER — Telehealth: Payer: Self-pay

## 2018-09-24 NOTE — Telephone Encounter (Signed)
Left pt message to call back and schedule 1 week virtual visit and to give current BP readings for yesterday and today.

## 2018-09-24 NOTE — Patient Instructions (Addendum)
Medication Instructions:  Your physician recommends that you continue on your current medications as directed. Please refer to the Current Medication list given to you today.  If you need a refill on your cardiac medications before your next appointment, please call your pharmacy.   Lab work: NONE If you have labs (blood work) drawn today and your tests are completely normal, you will receive your results only by: Marland Kitchen MyChart Message (if you have MyChart) OR . A paper copy in the mail If you have any lab test that is abnormal or we need to change your treatment, we will call you to review the results.  Testing/Procedures: Your physician has requested that you regularly monitor and record your blood pressure readings at home. Please use the same machine at the same time of day to check your readings and record them to bring to your follow-up visit.   Blood Pressure Record Sheet To take your blood pressure, you will need a blood pressure machine. You can buy a blood pressure machine (blood pressure monitor) at your clinic, drug store, or online. When choosing one, consider:  An automatic monitor that has an arm cuff.  A cuff that wraps snugly around your upper arm. You should be able to fit only one finger between your arm and the cuff.  A device that stores blood pressure reading results.  Do not choose a monitor that measures your blood pressure from your wrist or finger. Follow your health care provider's instructions for how to take your blood pressure. To use this form:  Get one reading in the morning (a.m.) before you take any medicines.  Get one reading in the evening (p.m.) before supper.  Take at least 2 readings with each blood pressure check. This makes sure the results are correct. Wait 1-2 minutes between measurements.  Write down the results in the spaces on this form.  Repeat this once a week, or as told by your health care provider.  Make a follow-up appointment with  your health care provider to discuss the results. Blood pressure log Date: _______________________  a.m. _____________________(1st reading) _____________________(2nd reading)  p.m. _____________________(1st reading) _____________________(2nd reading) Date: _______________________  a.m. _____________________(1st reading) _____________________(2nd reading)  p.m. _____________________(1st reading) _____________________(2nd reading) Date: _______________________  a.m. _____________________(1st reading) _____________________(2nd reading)  p.m. _____________________(1st reading) _____________________(2nd reading) Date: _______________________  a.m. _____________________(1st reading) _____________________(2nd reading)  p.m. _____________________(1st reading) _____________________(2nd reading) Date: _______________________  a.m. _____________________(1st reading) _____________________(2nd reading)  p.m. _____________________(1st reading) _____________________(2nd reading) This information is not intended to replace advice given to you by your health care provider. Make sure you discuss any questions you have with your health care provider. Document Released: 02/18/2003 Document Revised: 05/22/2017 Document Reviewed: 05/22/2017 Elsevier Interactive Patient Education  2019 [employer].     Follow-Up: At [facility], you and your health needs are our priority.  As part of our continuing mission to provide you with exceptional heart care, we have created designated Provider Care Teams.  These Care Teams include your primary Cardiologist (physician) and Advanced Practice Providers (APPs -  Physician Assistants and Nurse Practitioners) who all work together to provide you with the care you need, when you need it. You will need a follow up appointment in 7 days.

## 2018-09-25 DIAGNOSIS — L304 Erythema intertrigo: Secondary | ICD-10-CM | POA: Diagnosis not present

## 2018-09-25 DIAGNOSIS — L02224 Furuncle of groin: Secondary | ICD-10-CM | POA: Diagnosis not present

## 2018-09-25 DIAGNOSIS — E782 Mixed hyperlipidemia: Secondary | ICD-10-CM | POA: Diagnosis not present

## 2018-09-25 DIAGNOSIS — Z1159 Encounter for screening for other viral diseases: Secondary | ICD-10-CM | POA: Diagnosis not present

## 2018-09-25 DIAGNOSIS — R0602 Shortness of breath: Secondary | ICD-10-CM | POA: Diagnosis not present

## 2018-09-25 DIAGNOSIS — R072 Precordial pain: Secondary | ICD-10-CM | POA: Diagnosis not present

## 2018-09-25 DIAGNOSIS — E1169 Type 2 diabetes mellitus with other specified complication: Secondary | ICD-10-CM | POA: Diagnosis not present

## 2018-10-02 ENCOUNTER — Other Ambulatory Visit: Payer: Self-pay

## 2018-10-02 MED ORDER — TICAGRELOR 90 MG PO TABS: 90.0000 mg | ORAL_TABLET | Freq: Two times a day (BID) | ORAL | 3 refills | Status: DC

## 2018-10-02 MED ORDER — ATORVASTATIN CALCIUM 80 MG PO TABS: 80.0000 mg | ORAL_TABLET | Freq: Every day | ORAL | 3 refills | Status: DC

## 2018-10-02 MED ORDER — METOPROLOL TARTRATE 25 MG PO TABS: 12.5000 mg | ORAL_TABLET | Freq: Two times a day (BID) | ORAL | 3 refills | Status: DC

## 2018-10-30 DIAGNOSIS — N951 Menopausal and female climacteric states: Secondary | ICD-10-CM | POA: Diagnosis not present

## 2018-10-30 DIAGNOSIS — Z0000 Encounter for general adult medical examination without abnormal findings: Secondary | ICD-10-CM | POA: Diagnosis not present

## 2018-10-30 DIAGNOSIS — Z1231 Encounter for screening mammogram for malignant neoplasm of breast: Secondary | ICD-10-CM | POA: Diagnosis not present

## 2018-10-31 DIAGNOSIS — R51 Headache: Secondary | ICD-10-CM | POA: Diagnosis not present

## 2018-10-31 DIAGNOSIS — J322 Chronic ethmoidal sinusitis: Secondary | ICD-10-CM | POA: Diagnosis not present

## 2018-10-31 DIAGNOSIS — J323 Chronic sphenoidal sinusitis: Secondary | ICD-10-CM | POA: Diagnosis not present

## 2018-11-18 ENCOUNTER — Other Ambulatory Visit: Payer: Self-pay

## 2018-11-18 MED ORDER — ATORVASTATIN CALCIUM 80 MG PO TABS: 80.0000 mg | ORAL_TABLET | Freq: Every day | ORAL | 0 refills | Status: DC

## 2018-11-18 NOTE — Telephone Encounter (Signed)
90 day refill for atorvastatin sent to pharmacy

## 2018-11-26 ENCOUNTER — Ambulatory Visit: Payer: Medicare Other | Admitting: Cardiology

## 2018-12-16 ENCOUNTER — Telehealth: Payer: Self-pay | Admitting: *Deleted

## 2018-12-16 NOTE — Telephone Encounter (Signed)
Telephone call from patient. Spoke with daughter. States having chest pressure and wants to know if she should take a nitroglycerin. Instructed her to give NTG 1 tab every 5 min x3 for chest pressure and if not resolved to go to the emergency room. Daughter verbalized understanding.

## 2018-12-25 ENCOUNTER — Other Ambulatory Visit: Payer: Self-pay

## 2018-12-25 MED ORDER — ATORVASTATIN CALCIUM 80 MG PO TABS: 80.0000 mg | ORAL_TABLET | Freq: Every day | ORAL | 0 refills | Status: DC

## 2019-01-03 DIAGNOSIS — E1121 Type 2 diabetes mellitus with diabetic nephropathy: Secondary | ICD-10-CM | POA: Diagnosis not present

## 2019-01-03 DIAGNOSIS — F320 Major depressive disorder, single episode, mild: Secondary | ICD-10-CM | POA: Diagnosis not present

## 2019-01-03 DIAGNOSIS — E038 Other specified hypothyroidism: Secondary | ICD-10-CM | POA: Diagnosis not present

## 2019-01-03 DIAGNOSIS — Z6829 Body mass index (BMI) 29.0-29.9, adult: Secondary | ICD-10-CM | POA: Diagnosis not present

## 2019-01-03 DIAGNOSIS — E663 Overweight: Secondary | ICD-10-CM | POA: Diagnosis not present

## 2019-01-03 DIAGNOSIS — E1169 Type 2 diabetes mellitus with other specified complication: Secondary | ICD-10-CM | POA: Diagnosis not present

## 2019-01-03 DIAGNOSIS — F17219 Nicotine dependence, cigarettes, with unspecified nicotine-induced disorders: Secondary | ICD-10-CM | POA: Diagnosis not present

## 2019-01-03 DIAGNOSIS — E782 Mixed hyperlipidemia: Secondary | ICD-10-CM | POA: Diagnosis not present

## 2019-01-03 DIAGNOSIS — M0589 Other rheumatoid arthritis with rheumatoid factor of multiple sites: Secondary | ICD-10-CM | POA: Diagnosis not present

## 2019-01-03 DIAGNOSIS — E559 Vitamin D deficiency, unspecified: Secondary | ICD-10-CM | POA: Diagnosis not present

## 2019-01-06 DIAGNOSIS — E559 Vitamin D deficiency, unspecified: Secondary | ICD-10-CM | POA: Diagnosis not present

## 2019-01-06 DIAGNOSIS — E038 Other specified hypothyroidism: Secondary | ICD-10-CM | POA: Diagnosis not present

## 2019-01-10 ENCOUNTER — Telehealth (INDEPENDENT_AMBULATORY_CARE_PROVIDER_SITE_OTHER): Payer: Medicare Other | Admitting: Cardiology

## 2019-01-10 ENCOUNTER — Encounter: Payer: Self-pay | Admitting: Cardiology

## 2019-01-10 VITALS — Ht 63.0 in | Wt 176.0 lb

## 2019-01-10 DIAGNOSIS — Z955 Presence of coronary angioplasty implant and graft: Secondary | ICD-10-CM

## 2019-01-10 DIAGNOSIS — G4733 Obstructive sleep apnea (adult) (pediatric): Secondary | ICD-10-CM

## 2019-01-10 DIAGNOSIS — E782 Mixed hyperlipidemia: Secondary | ICD-10-CM

## 2019-01-10 DIAGNOSIS — I10 Essential (primary) hypertension: Secondary | ICD-10-CM | POA: Diagnosis not present

## 2019-01-10 DIAGNOSIS — G459 Transient cerebral ischemic attack, unspecified: Secondary | ICD-10-CM

## 2019-01-10 DIAGNOSIS — I209 Angina pectoris, unspecified: Secondary | ICD-10-CM

## 2019-01-10 DIAGNOSIS — Z01818 Encounter for other preprocedural examination: Secondary | ICD-10-CM

## 2019-01-10 DIAGNOSIS — E088 Diabetes mellitus due to underlying condition with unspecified complications: Secondary | ICD-10-CM

## 2019-01-10 DIAGNOSIS — I25119 Atherosclerotic heart disease of native coronary artery with unspecified angina pectoris: Secondary | ICD-10-CM

## 2019-01-10 DIAGNOSIS — I2510 Atherosclerotic heart disease of native coronary artery without angina pectoris: Secondary | ICD-10-CM

## 2019-01-10 HISTORY — DX: Angina pectoris, unspecified: I20.9

## 2019-01-10 NOTE — H&P (View-Only) (Signed)
Virtual Visit via Telephone Note   This visit type was conducted due to national recommendations for restrictions regarding the COVID-19 Pandemic (e.g. social distancing) in an effort to limit this patient's exposure and mitigate transmission in our community.  Due to her co-morbid illnesses, this patient is at least at moderate risk for complications without adequate follow up.  This format is felt to be most appropriate for this patient at this time.  The patient did not have access to video technology/had technical difficulties with video requiring transitioning to audio format only (telephone).  All issues noted in this document were discussed and addressed.  No physical exam could be performed with this format.  Please refer to the patient's chart for her  consent to telehealth for [facility].   Date:  01/10/2019   ID:  Brenda Cochran, DOB 1950/08/03, MRN [id number]  Patient Location: Home Provider Location: Office  PCP:  Rochel Brome, MD  Cardiologist:  No primary care provider on file.  Electrophysiologist:  None   Evaluation Performed:  Follow-Up Visit  Chief Complaint: Angina pectoris  History of Present Illness:    Brenda Cochran is a 68 y.o. female with past medical history of coronary artery disease post stenting earlier this year.  The patient mentions to me that she is having chest tightness and in the past month has used nitroglycerin 4 times.  On one occasion she had to use nitroglycerin twice for chest tightness and this relieved the pain.  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  No orthopnea or PND.  At the time of my evaluation, the patient is alert awake oriented and in no distress.  The patient does not have symptoms concerning for COVID-19 infection (fever, chills, cough, or new shortness of breath).    Past Medical History:  Diagnosis Date  . Asthma   . COPD (chronic obstructive pulmonary disease) ([place])   . Depression   . Fibromyalgia   . GERD  (gastroesophageal reflux disease)   . Hyperlipidemia   . Hypothyroidism (acquired)   . Osteoarthritis   . RA (rheumatoid arthritis) ([place])    Brenda Cochran  . Skin cancer    face  . Sleep apnea    cpap  . Type 2 diabetes mellitus ([place])    Past Surgical History:  Procedure Laterality Date  . ABDOMINAL HYSTERECTOMY  1984  . BREAST LUMPECTOMY  1976   left, benign  . CHOLECYSTECTOMY  09/05/10  . CORONARY STENT INTERVENTION N/A 07/12/2018   Procedure: CORONARY STENT INTERVENTION;  Surgeon: Jettie Booze, MD;  Location: [facility];  Service: Cardiovascular;  Laterality: N/A;  . CORONARY THROMBECTOMY N/A 07/12/2018   Procedure: Coronary Thrombectomy;  Surgeon: Jettie Booze, MD;  Location: [facility];  Service: Cardiovascular;  Laterality: N/A;  . LEFT HEART CATH AND CORONARY ANGIOGRAPHY N/A 07/12/2018   Procedure: LEFT HEART CATH AND CORONARY ANGIOGRAPHY;  Surgeon: Jettie Booze, MD;  Location: [facility];  Service: Cardiovascular;  Laterality: N/A;     Current Meds  Medication Sig  . aspirin EC 81 MG tablet Take 1 tablet (81 mg total) by mouth daily.  Marland Kitchen bismuth subsalicylate (PEPTO BISMOL) 262 MG/15ML suspension Take 30 mLs by mouth every 6 (six) hours as needed (for nausea/vomiting).  . budesonide-formoterol (SYMBICORT) 160-4.5 MCG/ACT inhaler Inhale 1 puff into the lungs See admin instructions. Inhale 1 puff into the lungs every morning, may also inhale 1 puff later in  the day as needed for shortness of breath  . etodolac (LODINE) 400 MG tablet Take 400 mg by mouth daily.   Marland Kitchen HYDROcodone-acetaminophen (NORCO/VICODIN) 5-325 MG tablet Take 1 tablet by mouth daily as needed (pain).   . Insulin Glargine (TOUJEO MAX SOLOSTAR [place]) Inject into the skin.  . rosuvastatin (CRESTOR) 10 MG tablet Take 10 mg by mouth daily.  . Semaglutide,0.25 or 0.5MG /DOS, (OZEMPIC, 0.25 OR 0.5 MG/DOSE,) 2 MG/1.5ML SOPN Inject into the skin.  Marland Kitchen ticagrelor (BRILINTA) 90 MG TABS  tablet Take 1 tablet (90 mg total) by mouth 2 (two) times daily.     Allergies:   Shellfish allergy, Contrast media [iodinated diagnostic agents], Metformin and related, Cymbalta [duloxetine hcl], Ultram [tramadol], and Oxycontin [oxycodone hcl]   Social History   Tobacco Use  . Smoking status: Current Every Day Smoker    Packs/day: 1.00    Years: 30.00    Pack years: 30.00    Types: Cigarettes    Last attempt to quit: 06/04/2010    Years since quitting: 8.6  . Smokeless tobacco: Current User  Substance Use Topics  . Alcohol use: No  . Drug use: No     Family Hx: The patient's family history includes Bone cancer in her father; Breast cancer in her sister; CAD in an other family member; Diabetes in an other family member; Hypertension in an other family member; Lung cancer in her father; Lung disease in her maternal uncle, maternal uncle, and mother; Lymphoma in her sister; Multiple sclerosis in her brother; Muscular dystrophy in her brother; Seizures in an other family member. There is no history of Colon cancer, Colon polyps, Esophageal cancer, Stomach cancer, or Rectal cancer.  ROS:   Please see the history of present illness.    As mentioned above All other systems reviewed and are negative.   Prior CV studies:   The following studies were reviewed today:  Coronary angiography report was reviewed in detail with her  Labs/Other Tests and Data Reviewed:    EKG:  Prior EKGs were reviewed  Recent Labs: 07/13/2018: Hemoglobin 13.1; Platelets 328 08/23/2018: ALT 21; BUN 8; Creatinine, Ser 0.73; Potassium 4.4; Sodium 144   Recent Lipid Panel Lab Results  Component Value Date/Time   CHOL 153 08/23/2018 09:12 AM   TRIG 180 (H) 08/23/2018 09:12 AM   HDL 40 08/23/2018 09:12 AM   CHOLHDL 3.8 08/23/2018 09:12 AM   CHOLHDL 5.7 07/12/2018 03:33 AM   LDLCALC 77 08/23/2018 09:12 AM    Wt Readings from Last 3 Encounters:  01/10/19 176 lb (79.8 kg)  09/23/18 183 lb (83 kg)   08/23/18 181 lb (82.1 kg)     Objective:    Vital Signs:  Ht 5\' 3"  (1.6 m)   Wt 176 lb (79.8 kg)   BMI 31.18 kg/m    VITAL SIGNS:  reviewed  ASSESSMENT & PLAN:    1. Angina pectoris: Patient's symptoms are very concerning and therefore I discussed coronary angiography with her at extensive length.  I discussed coronary angiography and left heart catheterization with the patient at extensive length. Procedure, benefits and potential risks were explained. Patient had multiple questions which were answered to the patient's satisfaction. Patient agreed and consented for the procedure. Further recommendations will be made based on the findings of the coronary angiography. In the interim. The patient has any significant symptoms he knows to go to the nearest emergency room. 2. Coronary artery disease: As mentioned above.  Secondary prevention stressed 3. Essential hypertension:  Blood pressure stable 4. Mixed dyslipidemia: Will do lipid check when she comes for blood work in the next few days.  We will also get an EKG on her. 5. She will be seen in follow-up appointment after the coronary angiography.  She knows to go to the nearest emergency room for any concerning symptoms.  COVID-19 Education: The signs and symptoms of COVID-19 were discussed with the patient and how to seek care for testing (follow up with PCP or arrange E-visit).  The importance of social distancing was discussed today.  Time:   Today, I have spent 25 minutes with the patient with telehealth technology discussing the above problems.     Medication Adjustments/Labs and Tests Ordered: Current medicines are reviewed at length with the patient today.  Concerns regarding medicines are outlined above.   Tests Ordered: No orders of the defined types were placed in this encounter.   Medication Changes: No orders of the defined types were placed in this encounter.   Follow Up:  Virtual Visit or In Person 2 months   Signed, Jenean Lindau, MD  01/10/2019 8:53 AM    [place]

## 2019-01-10 NOTE — Addendum Note (Signed)
Addended by: Polly Cobia A on: 01/10/2019 10:39 AM   Modules accepted: Orders

## 2019-01-10 NOTE — Addendum Note (Signed)
Addended by: Beckey Rutter on: 01/10/2019 02:17 PM   Modules accepted: Orders

## 2019-01-10 NOTE — Patient Instructions (Addendum)
Medication Instructions:  Your physician recommends that you continue on your current medications as directed. Please refer to the Current Medication list given to you today.  If you need a refill on your cardiac medications before your next appointment, please call your pharmacy.   Lab work: NONE  If you have labs (blood work) drawn today and your tests are completely normal, you will receive your results only by: Marland Kitchen MyChart Message (if you have MyChart) OR . A paper copy in the mail If you have any lab test that is abnormal or we need to change your treatment, we will call you to review the results.  Testing/Procedures: You have had an EKG performed.  YOU HAVE BEEN scheduled for CVD19  Screening on 01/16/19 at 3:05 pm at                                  [address], [place], [place] [address]   Your physician has requested that you have a cardiac catheterization. Cardiac catheterization is used to diagnose and/or treat various heart conditions. Doctors may recommend this procedure for a number of different reasons. The most common reason is to evaluate chest pain. Chest pain can be a symptom of coronary artery disease (CAD), and cardiac catheterization can show whether plaque is narrowing or blocking your heart's arteries. This procedure is also used to evaluate the valves, as well as measure the blood flow and oxygen levels in different parts of your heart. For further information please visit [email or web address]. Please follow instruction sheet, as given.     [place] [address] [place], [place] [address] Dept: [phone] Loc: [place]  01/10/2019  You are scheduled for a Cardiac Catheterization on Tuesday, August 18 with Dr. Larae Grooms.  1. Please arrive at the [facility] (Main Entrance A) at [facility]: [address] [place],  [address]  at 5:30 AM (This time is two hours before your procedure to ensure your preparation). Free valet parking service is available.   Special note: Every effort is made to have your procedure done on time. Please understand that emergencies sometimes delay scheduled procedures.  2. Diet: Do not eat solid foods after midnight.  The patient may have clear liquids until 5am upon the day of the procedure.  3. Labs:NONE 4. Medication instructions in preparation for your procedure:   Contrast Allergy: Yes, Please take Prednisone 50mg  by mouth at: 1. Thirteen hours prior to cath 6:00pm on Monday 2. Seven hours prior to cath 12:00am on Tuesday 3. And prior to leaving home please take last dose of Prednisone 50mg  and Benadryl 50mg  by mouth.   Take half dose of insulin Toujed night before procedure. DO NOT take any diabetic medication on day of procedure.    On the morning of your procedure, take your Aspirin and Brilinta/Ticagrelor and any morning medicines NOT listed above.  You may use sips of water.  5. Plan for one night stay--bring personal belongings. 6. Bring a current list of your medications and current insurance cards. 7. You MUST have a responsible person to drive you home. 8. Someone MUST be with you the first 24 hours after you arrive home or your discharge will be delayed. 9. Please wear clothes that are easy to get on and off and wear slip-on shoes.  Thank you  for allowing [place] to care for you!   -- [facility]  Follow-Up: At [facility], you and your health needs are our priority.  As part of our continuing mission to provide you with exceptional heart care, we have created designated Provider Care Teams.  These Care Teams include your primary Cardiologist (physician) and Advanced Practice Providers (APPs -  Physician Assistants and Nurse Practitioners) who all work together to provide you with the care you need, when you need it. . You will need a  follow up appointment in 2 months   Any Other Special Instructions Will Be Listed Below  Coronary Angiogram A coronary angiogram is an X-ray procedure that is used to examine the arteries in the heart. In this procedure, a dye (contrast dye) is injected through a long, thin tube (catheter). The catheter is inserted through the groin, wrist, or arm. The dye is injected into each artery, then X-rays are taken to show if there is a blockage in the arteries of the heart. This procedure can also show if you have valve disease or a disease of the aorta, and it can be used to check the overall function of your heart muscle. You may have a coronary angiogram if:  You are having chest pain, or other symptoms of angina, and you are at risk for heart disease.  You have an abnormal electrocardiogram (ECG) or stress test.  You have chest pain and heart failure.  You are having irregular heart rhythms.  You and your health care provider determine that the benefits of the test information outweigh the risks of the procedure. Let your health care provider know about:  Any allergies you have, including allergies to contrast dye.  All medicines you are taking, including vitamins, herbs, eye drops, creams, and over-the-counter medicines.  Any problems you or family members have had with anesthetic medicines.  Any blood disorders you have.  Any surgeries you have had.  History of kidney problems or kidney failure.  Any medical conditions you have.  Whether you are pregnant or may be pregnant. What are the risks? Generally, this is a safe procedure. However, problems may occur, including:  Infection.  Allergic reaction to medicines or dyes that are used.  Bleeding from the access site or other locations.  Kidney injury, especially in people with impaired kidney function.  Stroke (rare).  Heart attack (rare).  Damage to other structures or organs. What happens before the  procedure? Staying hydrated Follow instructions from your health care provider about hydration, which may include:  Up to 2 hours before the procedure - you may continue to drink clear liquids, such as water, clear fruit juice, black coffee, and plain tea. Eating and drinking restrictions Follow instructions from your health care provider about eating and drinking, which may include:  8 hours before the procedure - stop eating heavy meals or foods such as meat, fried foods, or fatty foods.  6 hours before the procedure - stop eating light meals or foods, such as toast or cereal.  2 hours before the procedure - stop drinking clear liquids. General instructions  Ask your health care provider about: ? Changing or stopping your regular medicines. This is especially important if you are taking diabetes medicines or blood thinners. ? Taking medicines such as ibuprofen. These medicines can thin your blood. Do not take these medicines before your procedure if your health care provider instructs you not to, though aspirin may be recommended prior to coronary angiograms.  Plan to have someone take you home from the hospital or clinic.  You may need to have blood tests or X-rays done. What happens during the procedure?  An IV tube will be inserted into one of your veins.  You will be given one or more of the following: ? A medicine to help you relax (sedative). ? A medicine to numb the area where the catheter will be inserted into an artery (local anesthetic).  To reduce your risk of infection: ? Your health care team will wash or sanitize their hands. ? Your skin will be washed with soap. ? Hair may be removed from the area where the catheter will be inserted.  You will be connected to a continuous ECG monitor.  The catheter will be inserted into an artery. The location may be in your groin, in your wrist, or in the fold of your arm (near your elbow).  A type of X-ray (fluoroscopy) will  be used to help guide the catheter to the opening of the blood vessel that is being examined.  A dye will be injected into the catheter, and X-rays will be taken. The dye will help to show where any narrowing or blockages are located in the heart arteries.  Tell your health care provider if you have any chest pain or trouble breathing during the procedure.  If blockages are found, your health care provider may perform another procedure, such as inserting a coronary stent. The procedure may vary among health care providers and hospitals. What happens after the procedure?  After the procedure, you will need to keep the area still for a few hours, or for as long as told by your health care provider. If the procedure is done through the groin, you will be instructed to not bend and not cross your legs.  The insertion site will be checked frequently.  The pulse in your foot or wrist will be checked frequently.  You may have additional blood tests, X-rays, and a test that records the electrical activity of your heart (ECG).  Do not drive for 24 hours if you were given a sedative. Summary  A coronary angiogram is an X-ray procedure that is used to look into the arteries in the heart.  During the procedure, a dye (contrast dye) is injected through a long, thin tube (catheter). The catheter is inserted through the groin, wrist, or arm.  Tell your health care provider about any allergies you have, including allergies to contrast dye.  After the procedure, you will need to keep the area still for a few hours, or for as long as told by your health care provider. This information is not intended to replace advice given to you by your health care provider. Make sure you discuss any questions you have with your health care provider. Document Released: 11/26/2002 Document Revised: 05/04/2017 Document Reviewed: 03/03/2016 Elsevier Patient Education  2020 [employer].

## 2019-01-10 NOTE — Progress Notes (Signed)
Virtual Visit via Telephone Note   This visit type was conducted due to national recommendations for restrictions regarding the COVID-19 Pandemic (e.g. social distancing) in an effort to limit this patient's exposure and mitigate transmission in our community.  Due to her co-morbid illnesses, this patient is at least at moderate risk for complications without adequate follow up.  This format is felt to be most appropriate for this patient at this time.  The patient did not have access to video technology/had technical difficulties with video requiring transitioning to audio format only (telephone).  All issues noted in this document were discussed and addressed.  No physical exam could be performed with this format.  Please refer to the patient's chart for her  consent to telehealth for [facility].   Date:  01/10/2019   ID:  Brenda Cochran, DOB 03-Sep-1950, MRN [id number]  Patient Location: Home Provider Location: Office  PCP:  Rochel Brome, MD  Cardiologist:  No primary care provider on file.  Electrophysiologist:  None   Evaluation Performed:  Follow-Up Visit  Chief Complaint: Angina pectoris  History of Present Illness:    Brenda Cochran is a 68 y.o. female with past medical history of coronary artery disease post stenting earlier this year.  The patient mentions to me that she is having chest tightness and in the past month has used nitroglycerin 4 times.  On one occasion she had to use nitroglycerin twice for chest tightness and this relieved the pain.  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  No orthopnea or PND.  At the time of my evaluation, the patient is alert awake oriented and in no distress.  The patient does not have symptoms concerning for COVID-19 infection (fever, chills, cough, or new shortness of breath).    Past Medical History:  Diagnosis Date  . Asthma   . COPD (chronic obstructive pulmonary disease) ([place])   . Depression   . Fibromyalgia   . GERD  (gastroesophageal reflux disease)   . Hyperlipidemia   . Hypothyroidism (acquired)   . Osteoarthritis   . RA (rheumatoid arthritis) ([place])    Brenda Cochran  . Skin cancer    face  . Sleep apnea    cpap  . Type 2 diabetes mellitus ([place])    Past Surgical History:  Procedure Laterality Date  . ABDOMINAL HYSTERECTOMY  1984  . BREAST LUMPECTOMY  1976   left, benign  . CHOLECYSTECTOMY  09/05/10  . CORONARY STENT INTERVENTION N/A 07/12/2018   Procedure: CORONARY STENT INTERVENTION;  Surgeon: Jettie Booze, MD;  Location: [place] [facility];  Service: Cardiovascular;  Laterality: N/A;  . CORONARY THROMBECTOMY N/A 07/12/2018   Procedure: Coronary Thrombectomy;  Surgeon: Jettie Booze, MD;  Location: [place] [facility];  Service: Cardiovascular;  Laterality: N/A;  . LEFT HEART CATH AND CORONARY ANGIOGRAPHY N/A 07/12/2018   Procedure: LEFT HEART CATH AND CORONARY ANGIOGRAPHY;  Surgeon: Jettie Booze, MD;  Location: [place] [facility];  Service: Cardiovascular;  Laterality: N/A;     Current Meds  Medication Sig  . aspirin EC 81 MG tablet Take 1 tablet (81 mg total) by mouth daily.  Brenda Cochran Kitchen bismuth subsalicylate (PEPTO BISMOL) 262 MG/15ML suspension Take 30 mLs by mouth every 6 (six) hours as needed (for nausea/vomiting).  . budesonide-formoterol (SYMBICORT) 160-4.5 MCG/ACT inhaler Inhale 1 puff into the lungs See admin instructions. Inhale 1 puff into the lungs every morning, may also inhale 1 puff later in  the day as needed for shortness of breath  . etodolac (LODINE) 400 MG tablet Take 400 mg by mouth daily.   Brenda Cochran Kitchen HYDROcodone-acetaminophen (NORCO/VICODIN) 5-325 MG tablet Take 1 tablet by mouth daily as needed (pain).   . Insulin Glargine (TOUJEO MAX SOLOSTAR [place]) Inject into the skin.  . rosuvastatin (CRESTOR) 10 MG tablet Take 10 mg by mouth daily.  . Semaglutide,0.25 or 0.5MG /DOS, (OZEMPIC, 0.25 OR 0.5 MG/DOSE,) 2 MG/1.5ML SOPN Inject into the skin.  Brenda Cochran Kitchen ticagrelor (BRILINTA) 90 MG TABS  tablet Take 1 tablet (90 mg total) by mouth 2 (two) times daily.     Allergies:   Shellfish allergy, Contrast media [iodinated diagnostic agents], Metformin and related, Cymbalta [duloxetine hcl], Ultram [tramadol], and Oxycontin [oxycodone hcl]   Social History   Tobacco Use  . Smoking status: Current Every Day Smoker    Packs/day: 1.00    Years: 30.00    Pack years: 30.00    Types: Cigarettes    Last attempt to quit: 06/04/2010    Years since quitting: 8.6  . Smokeless tobacco: Current User  Substance Use Topics  . Alcohol use: No  . Drug use: No     Family Hx: The patient's family history includes Bone cancer in her father; Breast cancer in her sister; CAD in an other family member; Diabetes in an other family member; Hypertension in an other family member; Lung cancer in her father; Lung disease in her maternal uncle, maternal uncle, and mother; Lymphoma in her sister; Multiple sclerosis in her brother; Muscular dystrophy in her brother; Seizures in an other family member. There is no history of Colon cancer, Colon polyps, Esophageal cancer, Stomach cancer, or Rectal cancer.  ROS:   Please see the history of present illness.    As mentioned above All other systems reviewed and are negative.   Prior CV studies:   The following studies were reviewed today:  Coronary angiography report was reviewed in detail with her  Labs/Other Tests and Data Reviewed:    EKG:  Prior EKGs were reviewed  Recent Labs: 07/13/2018: Hemoglobin 13.1; Platelets 328 08/23/2018: ALT 21; BUN 8; Creatinine, Ser 0.73; Potassium 4.4; Sodium 144   Recent Lipid Panel Lab Results  Component Value Date/Time   CHOL 153 08/23/2018 09:12 AM   TRIG 180 (H) 08/23/2018 09:12 AM   HDL 40 08/23/2018 09:12 AM   CHOLHDL 3.8 08/23/2018 09:12 AM   CHOLHDL 5.7 07/12/2018 03:33 AM   LDLCALC 77 08/23/2018 09:12 AM    Wt Readings from Last 3 Encounters:  01/10/19 176 lb (79.8 kg)  09/23/18 183 lb (83 kg)   08/23/18 181 lb (82.1 kg)     Objective:    Vital Signs:  Ht 5\' 3"  (1.6 m)   Wt 176 lb (79.8 kg)   BMI 31.18 kg/m    VITAL SIGNS:  reviewed  ASSESSMENT & PLAN:    1. Angina pectoris: Patient's symptoms are very concerning and therefore I discussed coronary angiography with her at extensive length.  I discussed coronary angiography and left heart catheterization with the patient at extensive length. Procedure, benefits and potential risks were explained. Patient had multiple questions which were answered to the patient's satisfaction. Patient agreed and consented for the procedure. Further recommendations will be made based on the findings of the coronary angiography. In the interim. The patient has any significant symptoms he knows to go to the nearest emergency room. 2. Coronary artery disease: As mentioned above.  Secondary prevention stressed 3. Essential hypertension:  Blood pressure stable 4. Mixed dyslipidemia: Will do lipid check when she comes for blood work in the next few days.  We will also get an EKG on her. 5. She will be seen in follow-up appointment after the coronary angiography.  She knows to go to the nearest emergency room for any concerning symptoms.  COVID-19 Education: The signs and symptoms of COVID-19 were discussed with the patient and how to seek care for testing (follow up with PCP or arrange E-visit).  The importance of social distancing was discussed today.  Time:   Today, I have spent 25 minutes with the patient with telehealth technology discussing the above problems.     Medication Adjustments/Labs and Tests Ordered: Current medicines are reviewed at length with the patient today.  Concerns regarding medicines are outlined above.   Tests Ordered: No orders of the defined types were placed in this encounter.   Medication Changes: No orders of the defined types were placed in this encounter.   Follow Up:  Virtual Visit or In Person 2 months   Signed, Jenean Lindau, MD  01/10/2019 8:53 AM    [place]

## 2019-01-13 DIAGNOSIS — I209 Angina pectoris, unspecified: Secondary | ICD-10-CM | POA: Diagnosis not present

## 2019-01-13 DIAGNOSIS — E1121 Type 2 diabetes mellitus with diabetic nephropathy: Secondary | ICD-10-CM | POA: Diagnosis not present

## 2019-01-13 DIAGNOSIS — I2510 Atherosclerotic heart disease of native coronary artery without angina pectoris: Secondary | ICD-10-CM | POA: Diagnosis not present

## 2019-01-13 DIAGNOSIS — Z01818 Encounter for other preprocedural examination: Secondary | ICD-10-CM | POA: Diagnosis not present

## 2019-01-14 MED ORDER — PREDNISONE 50 MG PO TABS: ORAL_TABLET | ORAL | 0 refills | Status: DC

## 2019-01-14 MED ORDER — DIPHENHYDRAMINE HCL 50 MG PO TABS: 50.0000 mg | ORAL_TABLET | Freq: Every evening | ORAL | 0 refills | Status: DC | PRN

## 2019-01-14 NOTE — Addendum Note (Signed)
Addended by: Beckey Rutter on: 01/14/2019 10:21 AM   Modules accepted: Orders

## 2019-01-16 ENCOUNTER — Other Ambulatory Visit (HOSPITAL_COMMUNITY): Payer: Medicare Other

## 2019-01-17 ENCOUNTER — Other Ambulatory Visit (HOSPITAL_COMMUNITY)
Admission: RE | Admit: 2019-01-17 | Discharge: 2019-01-17 | Disposition: A | Discharge status: Home / Self Care | Payer: Medicare Other | Source: Ambulatory Visit | Attending: Interventional Cardiology | Admitting: Interventional Cardiology

## 2019-01-17 DIAGNOSIS — Z20828 Contact with and (suspected) exposure to other viral communicable diseases: Secondary | ICD-10-CM | POA: Diagnosis not present

## 2019-01-17 DIAGNOSIS — Z01812 Encounter for preprocedural laboratory examination: Secondary | ICD-10-CM | POA: Diagnosis not present

## 2019-01-17 LAB — SARS CORONAVIRUS 2 (TAT 6-24 HRS): SARS Coronavirus 2: NEGATIVE

## 2019-01-20 ENCOUNTER — Telehealth: Payer: Self-pay | Admitting: *Deleted

## 2019-01-20 NOTE — Telephone Encounter (Signed)
Error - duplicate

## 2019-01-20 NOTE — Telephone Encounter (Signed)
Duplicate

## 2019-01-20 NOTE — Telephone Encounter (Addendum)
Pt contacted pre-catheterization scheduled at [facility] for: Tuesday January 21, 2019 7:30 AM Verified arrival time and place: [place] [facility]) at: 5:30 AM   No solid food after midnight prior to cath, clear liquids until 5 AM day of procedure.  Contrast allergy: yes 13 hour Prednisone and Benadryl prep reviewed with patient: 01/20/19 6:30 PM Prednisone 50 mg 01/21/19 12:30 AM Prednisone 50 mg 01/21/19 AM just prior to leaving for hospital-Prednisone 50 mg and Benadryl 50 mg Pt advised not to drive.   Hold: Farxiga-AM of procedure. Pt states she is not currently taking Insulin or Ozempic.  Except hold medications AM meds can be  taken pre-cath with sip of water including: ASA 81 mg Brilinta 90 mg Prednisone 50 mg Benadryl 50 mg  Confirmed patient has responsible person to drive home post procedure and observe 24 hours after arriving home: yes  Due to Covid-19 pandemic, only one support person will be allowed with patient. Must be the same support person for that patient's entire stay, will be screened and required to wear a mask.   Patients are required to wear a mask when they enter the hospital.      COVID-19 Pre-Screening Questions:  . In the past 7 to 10 days have you had a cough,  shortness of breath, headache, congestion, fever (100 or greater) body aches, chills, sore throat, or sudden loss of taste or sense of smell? Cough,drainage from sinus . Have you been around anyone with known Covid 19? no . Have you been around anyone who is awaiting Covid 19 test results in the past 7 to 10 days? no . Have you been around anyone who has been exposed to Covid 19, or has mentioned symptoms of Covid 19 within the past 7 to 10 days? no  I reviewed procedure/mask/visitor instructions with patient, Covid-19 screening questions, she verbalized understanding, thanked me for call.

## 2019-01-21 ENCOUNTER — Encounter (HOSPITAL_COMMUNITY): Payer: Self-pay | Admitting: Interventional Cardiology

## 2019-01-21 ENCOUNTER — Ambulatory Visit (HOSPITAL_COMMUNITY)
Admission: RE | Admit: 2019-01-21 | Discharge: 2019-01-21 | Disposition: A | Discharge status: Home / Self Care | Payer: Medicare Other | Attending: Interventional Cardiology | Admitting: Interventional Cardiology

## 2019-01-21 ENCOUNTER — Other Ambulatory Visit: Payer: Self-pay

## 2019-01-21 ENCOUNTER — Encounter (HOSPITAL_COMMUNITY)
Admission: RE | Disposition: A | Discharge status: Home / Self Care | Payer: Self-pay | Source: Home / Self Care | Attending: Interventional Cardiology

## 2019-01-21 DIAGNOSIS — Z85828 Personal history of other malignant neoplasm of skin: Secondary | ICD-10-CM | POA: Diagnosis not present

## 2019-01-21 DIAGNOSIS — Z794 Long term (current) use of insulin: Secondary | ICD-10-CM | POA: Insufficient documentation

## 2019-01-21 DIAGNOSIS — Z955 Presence of coronary angioplasty implant and graft: Secondary | ICD-10-CM | POA: Diagnosis not present

## 2019-01-21 DIAGNOSIS — E088 Diabetes mellitus due to underlying condition with unspecified complications: Secondary | ICD-10-CM

## 2019-01-21 DIAGNOSIS — I10 Essential (primary) hypertension: Secondary | ICD-10-CM

## 2019-01-21 DIAGNOSIS — E119 Type 2 diabetes mellitus without complications: Secondary | ICD-10-CM | POA: Insufficient documentation

## 2019-01-21 DIAGNOSIS — Z888 Allergy status to other drugs, medicaments and biological substances status: Secondary | ICD-10-CM | POA: Diagnosis not present

## 2019-01-21 DIAGNOSIS — J449 Chronic obstructive pulmonary disease, unspecified: Secondary | ICD-10-CM | POA: Insufficient documentation

## 2019-01-21 DIAGNOSIS — G459 Transient cerebral ischemic attack, unspecified: Secondary | ICD-10-CM

## 2019-01-21 DIAGNOSIS — G4733 Obstructive sleep apnea (adult) (pediatric): Secondary | ICD-10-CM

## 2019-01-21 DIAGNOSIS — I209 Angina pectoris, unspecified: Secondary | ICD-10-CM

## 2019-01-21 DIAGNOSIS — I25119 Atherosclerotic heart disease of native coronary artery with unspecified angina pectoris: Secondary | ICD-10-CM | POA: Diagnosis not present

## 2019-01-21 DIAGNOSIS — F329 Major depressive disorder, single episode, unspecified: Secondary | ICD-10-CM | POA: Insufficient documentation

## 2019-01-21 DIAGNOSIS — I25118 Atherosclerotic heart disease of native coronary artery with other forms of angina pectoris: Secondary | ICD-10-CM | POA: Diagnosis not present

## 2019-01-21 DIAGNOSIS — M069 Rheumatoid arthritis, unspecified: Secondary | ICD-10-CM | POA: Diagnosis not present

## 2019-01-21 DIAGNOSIS — G4730 Sleep apnea, unspecified: Secondary | ICD-10-CM | POA: Insufficient documentation

## 2019-01-21 DIAGNOSIS — K219 Gastro-esophageal reflux disease without esophagitis: Secondary | ICD-10-CM | POA: Insufficient documentation

## 2019-01-21 DIAGNOSIS — Z79899 Other long term (current) drug therapy: Secondary | ICD-10-CM | POA: Insufficient documentation

## 2019-01-21 DIAGNOSIS — Z7982 Long term (current) use of aspirin: Secondary | ICD-10-CM | POA: Diagnosis not present

## 2019-01-21 DIAGNOSIS — M797 Fibromyalgia: Secondary | ICD-10-CM | POA: Diagnosis not present

## 2019-01-21 DIAGNOSIS — F17210 Nicotine dependence, cigarettes, uncomplicated: Secondary | ICD-10-CM | POA: Insufficient documentation

## 2019-01-21 DIAGNOSIS — E039 Hypothyroidism, unspecified: Secondary | ICD-10-CM | POA: Insufficient documentation

## 2019-01-21 DIAGNOSIS — E782 Mixed hyperlipidemia: Secondary | ICD-10-CM | POA: Insufficient documentation

## 2019-01-21 DIAGNOSIS — Z7951 Long term (current) use of inhaled steroids: Secondary | ICD-10-CM | POA: Insufficient documentation

## 2019-01-21 DIAGNOSIS — Z885 Allergy status to narcotic agent status: Secondary | ICD-10-CM | POA: Diagnosis not present

## 2019-01-21 DIAGNOSIS — Z8249 Family history of ischemic heart disease and other diseases of the circulatory system: Secondary | ICD-10-CM | POA: Insufficient documentation

## 2019-01-21 DIAGNOSIS — I2510 Atherosclerotic heart disease of native coronary artery without angina pectoris: Secondary | ICD-10-CM

## 2019-01-21 HISTORY — PX: INTRAVASCULAR PRESSURE WIRE/FFR STUDY: CATH118243

## 2019-01-21 HISTORY — PX: LEFT HEART CATH AND CORONARY ANGIOGRAPHY: CATH118249

## 2019-01-21 LAB — GLUCOSE, CAPILLARY: Glucose-Capillary: 268 mg/dL — ABNORMAL HIGH (ref 70–99)

## 2019-01-21 LAB — POCT ACTIVATED CLOTTING TIME: Activated Clotting Time: 301 seconds

## 2019-01-21 SURGERY — LEFT HEART CATH AND CORONARY ANGIOGRAPHY
Anesthesia: LOCAL

## 2019-01-21 MED ORDER — ASPIRIN 81 MG PO CHEW: 81.0000 mg | CHEWABLE_TABLET | ORAL | Status: DC

## 2019-01-21 MED ORDER — SODIUM CHLORIDE 0.9% FLUSH: 3.0000 mL | INTRAVENOUS | Status: DC | PRN

## 2019-01-21 MED ORDER — FENTANYL CITRATE (PF) 100 MCG/2ML IJ SOLN
INTRAMUSCULAR | Status: AC
  Filled 2019-01-21: qty 2

## 2019-01-21 MED ORDER — ONDANSETRON HCL 4 MG/2ML IJ SOLN: 4.0000 mg | Freq: Four times a day (QID) | INTRAMUSCULAR | Status: DC | PRN

## 2019-01-21 MED ORDER — VERAPAMIL HCL 2.5 MG/ML IV SOLN
INTRAVENOUS | Status: AC
  Filled 2019-01-21: qty 2

## 2019-01-21 MED ORDER — SODIUM CHLORIDE 0.9% FLUSH: 3.0000 mL | Freq: Two times a day (BID) | INTRAVENOUS | Status: DC

## 2019-01-21 MED ORDER — ACETAMINOPHEN 325 MG PO TABS: 650.0000 mg | ORAL_TABLET | ORAL | Status: DC | PRN

## 2019-01-21 MED ORDER — MIDAZOLAM HCL 2 MG/2ML IJ SOLN
INTRAMUSCULAR | Status: DC | PRN
  Administered 2019-01-21: 2 mg via INTRAVENOUS

## 2019-01-21 MED ORDER — HEPARIN SODIUM (PORCINE) 1000 UNIT/ML IJ SOLN
INTRAMUSCULAR | Status: DC | PRN
  Administered 2019-01-21 (×2): 4000 [IU] via INTRAVENOUS

## 2019-01-21 MED ORDER — FENTANYL CITRATE (PF) 100 MCG/2ML IJ SOLN
INTRAMUSCULAR | Status: DC | PRN
  Administered 2019-01-21: 25 ug via INTRAVENOUS

## 2019-01-21 MED ORDER — SODIUM CHLORIDE 0.9 % IV SOLN: INTRAVENOUS | Status: DC

## 2019-01-21 MED ORDER — HEPARIN (PORCINE) IN NACL 1000-0.9 UT/500ML-% IV SOLN
INTRAVENOUS | Status: AC
  Filled 2019-01-21: qty 1000

## 2019-01-21 MED ORDER — HEPARIN SODIUM (PORCINE) 1000 UNIT/ML IJ SOLN
INTRAMUSCULAR | Status: AC
  Filled 2019-01-21: qty 1

## 2019-01-21 MED ORDER — LIDOCAINE HCL (PF) 1 % IJ SOLN
INTRAMUSCULAR | Status: DC | PRN
  Administered 2019-01-21: 2 mL

## 2019-01-21 MED ORDER — SODIUM CHLORIDE 0.9 % IV SOLN: 250.0000 mL | INTRAVENOUS | Status: DC | PRN

## 2019-01-21 MED ORDER — HEPARIN (PORCINE) IN NACL 1000-0.9 UT/500ML-% IV SOLN
INTRAVENOUS | Status: DC | PRN
  Administered 2019-01-21 (×2): 500 mL

## 2019-01-21 MED ORDER — IOHEXOL 350 MG/ML SOLN
INTRAVENOUS | Status: DC | PRN
  Administered 2019-01-21: 80 mL via INTRA_ARTERIAL

## 2019-01-21 MED ORDER — LIDOCAINE HCL (PF) 1 % IJ SOLN
INTRAMUSCULAR | Status: AC
  Filled 2019-01-21: qty 30

## 2019-01-21 MED ORDER — VERAPAMIL HCL 2.5 MG/ML IV SOLN
INTRAVENOUS | Status: DC | PRN
  Administered 2019-01-21: 10 mL via INTRA_ARTERIAL

## 2019-01-21 MED ORDER — SODIUM CHLORIDE 0.9 % WEIGHT BASED INFUSION: 3.0000 mL/kg/h | INTRAVENOUS | Status: AC

## 2019-01-21 MED ORDER — LABETALOL HCL 5 MG/ML IV SOLN: 10.0000 mg | INTRAVENOUS | Status: DC | PRN

## 2019-01-21 MED ORDER — MIDAZOLAM HCL 2 MG/2ML IJ SOLN
INTRAMUSCULAR | Status: AC
  Filled 2019-01-21: qty 2

## 2019-01-21 MED ORDER — SODIUM CHLORIDE 0.9 % WEIGHT BASED INFUSION: 1.0000 mL/kg/h | INTRAVENOUS | Status: DC

## 2019-01-21 MED ORDER — HYDRALAZINE HCL 20 MG/ML IJ SOLN: 10.0000 mg | INTRAMUSCULAR | Status: DC | PRN

## 2019-01-21 SURGICAL SUPPLY — 12 items
CATH 5FR JL3.5 JR4 ANG PIG MP (CATHETERS) ×2 IMPLANT
CATH LAUNCHER 6FR EBU 3 (CATHETERS) ×2 IMPLANT
DEVICE RAD TR BAND REGULAR (VASCULAR PRODUCTS) ×2 IMPLANT
GLIDESHEATH SLEND SS 6F .021 (SHEATH) ×2 IMPLANT
GUIDEWIRE INQWIRE 1.5J.035X260 (WIRE) ×1 IMPLANT
GUIDEWIRE PRESSURE COMET II (WIRE) ×2 IMPLANT
INQWIRE 1.5J .035X260CM (WIRE) ×2
KIT HEART LEFT (KITS) ×2 IMPLANT
KIT HEMO VALVE WATCHDOG (MISCELLANEOUS) ×2 IMPLANT
PACK CARDIAC CATHETERIZATION (CUSTOM PROCEDURE TRAY) ×2 IMPLANT
TRANSDUCER W/STOPCOCK (MISCELLANEOUS) ×2 IMPLANT
TUBING CIL FLEX 10 FLL-RA (TUBING) ×2 IMPLANT

## 2019-01-21 NOTE — Discharge Instructions (Signed)
Radial Site Care ° °This sheet gives you information about how to care for yourself after your procedure. Your health care provider may also give you more specific instructions. If you have problems or questions, contact your health care provider. °What can I expect after the procedure? °After the procedure, it is common to have: °· Bruising and tenderness at the catheter insertion area. °Follow these instructions at home: °Medicines °· Take over-the-counter and prescription medicines only as told by your health care provider. °Insertion site care °· Follow instructions from your health care provider about how to take care of your insertion site. Make sure you: °? Wash your hands with soap and water before you change your bandage (dressing). If soap and water are not available, use hand sanitizer. °? Change your dressing as told by your health care provider. °? Leave stitches (sutures), skin glue, or adhesive strips in place. These skin closures may need to stay in place for 2 weeks or longer. If adhesive strip edges start to loosen and curl up, you may trim the loose edges. Do not remove adhesive strips completely unless your health care provider tells you to do that. °· Check your insertion site every day for signs of infection. Check for: °? Redness, swelling, or pain. °? Fluid or blood. °? Pus or a bad smell. °? Warmth. °· Do not take baths, swim, or use a hot tub until your health care provider approves. °· You may shower 24-48 hours after the procedure, or as directed by your health care provider. °? Remove the dressing and gently wash the site with plain soap and water. °? Pat the area dry with a clean towel. °? Do not rub the site. That could cause bleeding. °· Do not apply powder or lotion to the site. °Activity ° °· For 24 hours after the procedure, or as directed by your health care provider: °? Do not flex or bend the affected arm. °? Do not push or pull heavy objects with the affected arm. °? Do not  drive yourself home from the hospital or clinic. You may drive 24 hours after the procedure unless your health care provider tells you not to. °? Do not operate machinery or power tools. °· Do not lift anything that is heavier than 10 lb (4.5 kg), or the limit that you are told, until your health care provider says that it is safe. °· Ask your health care provider when it is okay to: °? Return to work or school. °? Resume usual physical activities or sports. °? Resume sexual activity. °General instructions °· If the catheter site starts to bleed, raise your arm and put firm pressure on the site. If the bleeding does not stop, get help right away. This is a medical emergency. °· If you went home on the same day as your procedure, a responsible adult should be with you for the first 24 hours after you arrive home. °· Keep all follow-up visits as told by your health care provider. This is important. °Contact a health care provider if: °· You have a fever. °· You have redness, swelling, or yellow drainage around your insertion site. °Get help right away if: °· You have unusual pain at the radial site. °· The catheter insertion area swells very fast. °· The insertion area is bleeding, and the bleeding does not stop when you hold steady pressure on the area. °· Your arm or hand becomes pale, cool, tingly, or numb. °These symptoms may represent a serious problem   that is an emergency. Do not wait to see if the symptoms will go away. Get medical help right away. Call your local emergency services (911 in the [place] Do not drive yourself to the hospital. °Summary °· After the procedure, it is common to have bruising and tenderness at the site. °· Follow instructions from your health care provider about how to take care of your radial site wound. Check the wound every day for signs of infection. °· Do not lift anything that is heavier than 10 lb (4.5 kg), or the limit that you are told, until your health care provider says  that it is safe. °This information is not intended to replace advice given to you by your health care provider. Make sure you discuss any questions you have with your health care provider. °Document Released: 06/24/2010 Document Revised: 06/27/2017 Document Reviewed: 06/27/2017 °Elsevier Patient Education © 2020 [employer] ° °

## 2019-01-21 NOTE — Interval H&P Note (Signed)
Cath Lab Visit (complete for each Cath Lab visit)  Clinical Evaluation Leading to the Procedure:   ACS: No.  Non-ACS:    Anginal Classification: CCS III  Anti-ischemic medical therapy: Minimal Therapy (1 class of medications)  Non-Invasive Test Results: No non-invasive testing performed  Prior CABG: No previous CABG      History and Physical Interval Note:  01/21/2019 7:34 AM  Brenda Cochran  has presented today for surgery, with the diagnosis of angina.  The various methods of treatment have been discussed with the patient and family. After consideration of risks, benefits and other options for treatment, the patient has consented to  Procedure(s): LEFT HEART CATH AND CORONARY ANGIOGRAPHY (N/A) as a surgical intervention.  The patient's history has been reviewed, patient examined, no change in status, stable for surgery.  I have reviewed the patient's chart and labs.  Questions were answered to the patient's satisfaction.     Larae Grooms

## 2019-02-07 ENCOUNTER — Other Ambulatory Visit: Payer: Self-pay

## 2019-02-07 ENCOUNTER — Ambulatory Visit (INDEPENDENT_AMBULATORY_CARE_PROVIDER_SITE_OTHER): Payer: Medicare Other | Admitting: Cardiology

## 2019-02-07 ENCOUNTER — Encounter: Payer: Self-pay | Admitting: Cardiology

## 2019-02-07 VITALS — BP 120/70 | HR 81 | Ht 65.0 in | Wt 172.0 lb

## 2019-02-07 DIAGNOSIS — I2510 Atherosclerotic heart disease of native coronary artery without angina pectoris: Secondary | ICD-10-CM

## 2019-02-07 DIAGNOSIS — E782 Mixed hyperlipidemia: Secondary | ICD-10-CM | POA: Diagnosis not present

## 2019-02-07 DIAGNOSIS — E088 Diabetes mellitus due to underlying condition with unspecified complications: Secondary | ICD-10-CM | POA: Diagnosis not present

## 2019-02-07 MED ORDER — NITROGLYCERIN 0.4 MG SL SUBL: 0.4000 mg | SUBLINGUAL_TABLET | SUBLINGUAL | 12 refills | Status: DC | PRN

## 2019-02-07 MED ORDER — RANOLAZINE ER 1000 MG PO TB12: ORAL_TABLET | ORAL | 3 refills | Status: DC

## 2019-02-07 NOTE — Addendum Note (Signed)
Addended by: Beckey Rutter on: 02/07/2019 10:36 AM   Modules accepted: Orders

## 2019-02-07 NOTE — Patient Instructions (Signed)
Medication Instructions:  Your physician has recommended you make the following change in your medication:   START taking Ranexa 500 mg (1 tablet) twice a day for two weeks then increase to 1000 mg (1 tablet) twice a day thereafter  START taking nitroglycerin as needed for chest pain When having chest pain, stop what you are doing and sit down. Take 1 nitro, wait 5 minutes. Still having chest pain, take 1 nitro, wait 5 minutes. Still having chest pain, take 1 nitro, dial 911. Total of 3 nitro in 15 minutes.  If you need a refill on your cardiac medications before your next appointment, please call your pharmacy.   Lab work: Your physician recommends that you return for lab work in 1 mo for BMP, hepatic and lipid to be drawn. YOU WILL need to be FASTING   If you have labs (blood work) drawn today and your tests are completely normal, you will receive your results only by: Marland Kitchen MyChart Message (if you have MyChart) OR . A paper copy in the mail If you have any lab test that is abnormal or we need to change your treatment, we will call you to review the results.  Testing/Procedures: YOU will need to have an EKG performed in month also.   Follow-Up: At [facility], you and your health needs are our priority.  As part of our continuing mission to provide you with exceptional heart care, we have created designated Provider Care Teams.  These Care Teams include your primary Cardiologist (physician) and Advanced Practice Providers (APPs -  Physician Assistants and Nurse Practitioners) who all work together to provide you with the care you need, when you need it. You will need a follow up appointment in 3 months.   Any Other Special Instructions Will Be Listed Below  Nitroglycerin sublingual powder What is this medicine? NITROGLYCERIN (nye troe GLI ser in) is a type of vasodilator. It relaxes blood vessels, increasing the blood and oxygen supply to your heart. This medicine is used to prevent or  relieve chest pain caused by angina. It is also used to prevent chest pain before activities like climbing stairs, going outdoors in cold weather, or sexual activity. This medicine may be used for other purposes; ask your health care provider or pharmacist if you have questions. COMMON BRAND NAME(S): GONITRO What should I tell my health care provider before I take this medicine? They need to know if you have any of these conditions:  anemia  bleeding in the brain  head injury  heart disease  liver disease  an unusual or allergic reaction to nitroglycerin, other medicines, foods, dyes, or preservatives  pregnant or trying to get pregnant  breast-feeding How should I use this medicine? This medicine is only for use in the mouth. Follow the directions on the prescription label. Hold the packet upright with the notch and red arrow line at the top of the packet. Tap the bottom of the packet so the powder settles at the bottom. Hold the packet at the notch and hold as close to your mouth as possible. Tear along the red arrow line. Lift up your tongue. Pour all of the powder in the packet under your tongue. Close your mouth tight right away and breathe normally through your nose. Allow the powder to dissolve before you swallow. Do not rinse your mouth or spit for 5 minutes after taking this medicine. You can repeat with 1 packet every 5 minutes for up to 3 packets. If you  do not feel better after 3 packets, call 911 immediately. Do not take your medicine more often than directed. If you take this medicine often to relieve symptoms of angina, your doctor or health care professional may provide you with different instructions to manage your symptoms. If symptoms do not go away after following these instructions, call 911 immediately. Do not take more than 3 packets over 15 minutes. Talk to your pediatrician regarding the use of this medicine in children. Special care may be needed. Overdosage: If you  think you have taken too much of this medicine contact a poison control center or emergency room at once. NOTE: This medicine is only for you. Do not share this medicine with others. What if I miss a dose? This does not apply; this medicine is not for regular use. What may interact with this medicine? Do not take this medicine with any of the following medications:  certain medicines for erectile dysfunction like avanafil, sildenafil, tadalafil, and vardenafil  ergot alkaloids like dihydroergotamine, ergonovine, ergotamine, methylergonovine  riociguat This medicine may also interact with the following medications:  certain medicines for blood pressure This list may not describe all possible interactions. Give your health care provider a list of all the medicines, herbs, non-prescription drugs, or dietary supplements you use. Also tell them if you smoke, drink alcohol, or use illegal drugs. Some items may interact with your medicine. What should I watch for while using this medicine? Tell your doctor or health care professional if your symptoms do not start to get better or if you feel worse. Keep this medicine with you at all times. Sit down when you take your medicine to prevent falling if you feel dizzy or faint after using it. Try to remain calm. This will help you to feel better faster. If you feel dizzy, take several deep breaths and lie down with your feet propped up, or bend forward with your head resting between your knees. You may get drowsy or dizzy. Do not drive, use machinery, or do anything that needs mental alertness until you know how this drug affects you. Do not stand or sit up quickly, especially if you are an older patient. This reduces the risk of dizzy or fainting spells. Do not treat yourself for coughs, colds, or pain while you are taking this medicine without asking your doctor or health care professional for advice. Some ingredients may increase your blood pressure. What  side effects may I notice from receiving this medicine? Side effects that you should report to your doctor or health care professional as soon as possible:  allergic reactions like skin rash, itching or hives, swelling of the face, lips, or tongue  signs and symptoms of anemia like breathing problems; dizziness; unusually weak or tired  signs and symptoms of low blood pressure like dizziness; feeling faint or lightheaded; falls; unusually weak or tired Side effects that usually do not require medical attention (report to your doctor or health care professional if they continue or are bothersome):  facial flushing  headache  nausea, vomiting This list may not describe all possible side effects. Call your doctor for medical advice about side effects. You may report side effects to FDA at 1-800-FDA-1088. Where should I keep my medicine? Keep out of the reach of children. Store between 5 and 40 degrees C (41 and 104 degrees F). Throw away any unused medicine after the expiration date. NOTE: This sheet is a summary. It may not cover all possible information. If you  have questions about this medicine, talk to your doctor, pharmacist, or health care provider.  2020 Elsevier/Gold Standard (2015-06-23 17:46:23) Ranolazine tablets, extended release What is this medicine? RANOLAZINE (ra NOE la zeen) is a heart medicine. It is used to treat chronic chest pain (angina). This medicine must be taken regularly. It will not relieve an acute episode of chest pain. This medicine may be used for other purposes; ask your health care provider or pharmacist if you have questions. COMMON BRAND NAME(S): Ranexa What should I tell my health care provider before I take this medicine? They need to know if you have any of these conditions:  heart disease  irregular heartbeat  kidney disease  liver disease  low levels of potassium or magnesium in the blood  an unusual or allergic reaction to ranolazine,  other medicines, foods, dyes, or preservatives  pregnant or trying to get pregnant  breast-feeding How should I use this medicine? Take this medicine by mouth with a glass of water. Follow the directions on the prescription label. Do not cut, crush, or chew this medicine. Take with or without food. Do not take this medication with grapefruit juice. Take your doses at regular intervals. Do not take your medicine more often then directed. Talk to your pediatrician regarding the use of this medicine in children. Special care may be needed. Overdosage: If you think you have taken too much of this medicine contact a poison control center or emergency room at once. NOTE: This medicine is only for you. Do not share this medicine with others. What if I miss a dose? If you miss a dose, take it as soon as you can. If it is almost time for your next dose, take only that dose. Do not take double or extra doses. What may interact with this medicine? Do not take this medicine with any of the following medications:  antivirals for HIV or AIDS  cerivastatin  certain antibiotics like chloramphenicol, clarithromycin, dalfopristin; quinupristin, isoniazid, rifabutin, rifampin, rifapentine  certain medicines used for cancer like imatinib, nilotinib  certain medicines for fungal infections like fluconazole, itraconazole, ketoconazole, posaconazole, voriconazole  certain medicines for irregular heart beat like dronedarone  certain medicines for seizures like carbamazepine, fosphenytoin, oxcarbazepine, phenobarbital, phenytoin  cisapride  conivaptan  cyclosporine  grapefruit or grapefruit juice  lumacaftor; ivacaftor  nefazodone  pimozide  quinacrine  St John's wort  thioridazine This medicine may also interact with the following medications:  alfuzosin  certain medicines for depression, anxiety, or psychotic disturbances like bupropion, citalopram, fluoxetine, fluphenazine, paroxetine,  perphenazine, risperidone, sertraline, trifluoperazine  certain medicines for cholesterol like atorvastatin, lovastatin, simvastatin  certain medicines for stomach problems like octreotide, palonosetron, prochlorperazine  eplerenone  ergot alkaloids like dihydroergotamine, ergonovine, ergotamine, methylergonovine  metformin  nicardipine  other medicines that prolong the QT interval (cause an abnormal heart rhythm) like dofetilide, ziprasidone  sirolimus  tacrolimus This list may not describe all possible interactions. Give your health care provider a list of all the medicines, herbs, non-prescription drugs, or dietary supplements you use. Also tell them if you smoke, drink alcohol, or use illegal drugs. Some items may interact with your medicine. What should I watch for while using this medicine? Visit your doctor for regular check ups. Tell your doctor or healthcare professional if your symptoms do not start to get better or if they get worse. This medicine will not relieve an acute attack of angina or chest pain. This medicine can change your heart rhythm. Your health care provider may  check your heart rhythm by ordering an electrocardiogram (ECG) while you are taking this medicine. You may get drowsy or dizzy. Do not drive, use machinery, or do anything that needs mental alertness until you know how this medicine affects you. Do not stand or sit up quickly, especially if you are an older patient. This reduces the risk of dizzy or fainting spells. Alcohol may interfere with the effect of this medicine. Avoid alcoholic drinks. If you are scheduled for any medical or dental procedure, tell your healthcare provider that you are taking this medicine. This medicine can interact with other medicines used during surgery. What side effects may I notice from receiving this medicine? Side effects that you should report to your doctor or health care professional as soon as possible:  allergic  reactions like skin rash, itching or hives, swelling of the face, lips, or tongue  breathing problems  changes in vision  fast, irregular or pounding heartbeat  feeling faint or lightheaded, falls  low or high blood pressure  numbness or tingling feelings  ringing in the ears  tremor or shakiness  slow heartbeat (fewer than 50 beats per minute)  swelling of the legs or feet Side effects that usually do not require medical attention (report to your doctor or health care professional if they continue or are bothersome):  constipation  drowsy  dry mouth  headache  nausea or vomiting  stomach upset This list may not describe all possible side effects. Call your doctor for medical advice about side effects. You may report side effects to FDA at 1-800-FDA-1088. Where should I keep my medicine? Keep out of the reach of children. Store at room temperature between 15 and 30 degrees C (59 and 86 degrees F). Throw away any unused medicine after the expiration date. NOTE: This sheet is a summary. It may not cover all possible information. If you have questions about this medicine, talk to your doctor, pharmacist, or health care provider.  2020 Elsevier/Gold Standard (2018-05-14 09:18:49)

## 2019-02-07 NOTE — Progress Notes (Signed)
Cardiology Office Note:    Date:  02/07/2019   ID:  Brenda Cochran, DOB 07/31/1950, MRN [id number]  PCP:  Rochel Brome, MD  Cardiologist:  Jenean Lindau, MD   Referring MD: Rochel Brome, MD    ASSESSMENT:    No diagnosis found. PLAN:    In order of problems listed above:  1. Coronary artery disease: Secondary prevention stressed with the patient.  Importance of compliance with diet and medication stressed and she vocalized understanding.  Her blood pressure is stable.  Diet was discussed for losing weight.  In view of her symptoms of having angina sometimes I told her it could be from small vessel disease and started her on ranolazine 500 mg twice daily for 2 weeks then 1 g twice daily after that.  She will be back in 1 month for EKG and blood work. 2. Mixed dyslipidemia: Diet was discussed.  She is on statin therapy.  As mentioned above she will be here for blood work and a month 3. Patient will be seen in follow-up appointment in 3 months or earlier if the patient has any concerns 4. She requests refill on nitroglycerin and we will do this for her.   Medication Adjustments/Labs and Tests Ordered: Current medicines are reviewed at length with the patient today.  Concerns regarding medicines are outlined above.  No orders of the defined types were placed in this encounter.  No orders of the defined types were placed in this encounter.    Chief Complaint  Patient presents with  . Follow-up     History of Present Illness:    Brenda Cochran is a 68 y.o. female.  Patient was evaluated by me for symptoms suggesting of angina.  Coronary angiography revealed nonobstructive disease.  Subsequently she is done well.  She is walking some on a regular basis.  No chest pain orthopnea or PND.  She did not pick up her beta-blocker and did not start taking it.  At the time of my evaluation, the patient is alert awake oriented and in no distress.  Past Medical History:  Diagnosis Date  .  Asthma   . COPD (chronic obstructive pulmonary disease) ([place])   . Depression   . Fibromyalgia   . GERD (gastroesophageal reflux disease)   . Hyperlipidemia   . Hypothyroidism (acquired)   . Osteoarthritis   . RA (rheumatoid arthritis) ([place])    Brenda Cochran  . Skin cancer    face  . Sleep apnea    cpap  . Type 2 diabetes mellitus ([place])     Past Surgical History:  Procedure Laterality Date  . ABDOMINAL HYSTERECTOMY  1984  . BREAST LUMPECTOMY  1976   left, benign  . CHOLECYSTECTOMY  09/05/10  . CORONARY STENT INTERVENTION N/A 07/12/2018   Procedure: CORONARY STENT INTERVENTION;  Surgeon: Jettie Booze, MD;  Location: [place] [facility];  Service: Cardiovascular;  Laterality: N/A;  . CORONARY THROMBECTOMY N/A 07/12/2018   Procedure: Coronary Thrombectomy;  Surgeon: Jettie Booze, MD;  Location: [place] [facility];  Service: Cardiovascular;  Laterality: N/A;  . INTRAVASCULAR PRESSURE WIRE/FFR STUDY N/A 01/21/2019   Procedure: INTRAVASCULAR PRESSURE WIRE/FFR STUDY;  Surgeon: Jettie Booze, MD;  Location: [place] [facility];  Service: Cardiovascular;  Laterality: N/A;  . LEFT HEART CATH AND CORONARY ANGIOGRAPHY N/A 07/12/2018   Procedure: LEFT HEART CATH AND CORONARY ANGIOGRAPHY;  Surgeon: Jettie Booze, MD;  Location: [place] [facility];  Service: Cardiovascular;  Laterality:  N/A;  . LEFT HEART CATH AND CORONARY ANGIOGRAPHY N/A 01/21/2019   Procedure: LEFT HEART CATH AND CORONARY ANGIOGRAPHY;  Surgeon: Jettie Booze, MD;  Location: [place] [facility];  Service: Cardiovascular;  Laterality: N/A;    Current Medications: Current Meds  Medication Sig  . albuterol (VENTOLIN HFA) 108 (90 Base) MCG/ACT inhaler Inhale 1-2 puffs into the lungs every 6 (six) hours as needed for wheezing or shortness of breath.  Marland Kitchen amitriptyline (ELAVIL) 25 MG tablet   . aspirin EC 81 MG tablet Take 1 tablet (81 mg total) by mouth daily.  . budesonide-formoterol (SYMBICORT) 160-4.5  MCG/ACT inhaler Inhale 1 puff into the lungs 2 (two) times daily as needed (shortness of breath).   . dapagliflozin propanediol (FARXIGA) 10 MG TABS tablet Take 10 mg by mouth daily.  . diphenhydrAMINE (BENADRYL) 50 MG tablet Take 1 tablet (50 mg total) by mouth at bedtime as needed for itching.  . etodolac (LODINE) 400 MG tablet Take 400 mg by mouth 2 (two) times daily.   Marland Kitchen HYDROcodone-acetaminophen (NORCO/VICODIN) 5-325 MG tablet Take 1 tablet by mouth daily as needed (pain).   . Insulin Glargine (TOUJEO MAX SOLOSTAR ) Inject 10 Units into the skin daily.   Marland Kitchen leflunomide (ARAVA) 20 MG tablet Take 20 mg by mouth daily.  . metoprolol tartrate (LOPRESSOR) 25 MG tablet   . nitroGLYCERIN (NITROSTAT) 0.4 MG SL tablet Place 1 tablet (0.4 mg total) under the tongue every 5 (five) minutes x 3 doses as needed for chest pain.  . predniSONE (DELTASONE) 5 MG tablet Take 5 mg by mouth daily as needed (arthritis inflammation).   . predniSONE (DELTASONE) 50 MG tablet To be taken prior to cath procedure.  . rosuvastatin (CRESTOR) 10 MG tablet Take 10 mg by mouth at bedtime.   . Semaglutide,0.25 or 0.5MG /DOS, (OZEMPIC, 0.25 OR 0.5 MG/DOSE,) 2 MG/1.5ML SOPN Inject 0.25 mg into the skin every 7 (seven) days.   . ticagrelor (BRILINTA) 90 MG TABS tablet Take 1 tablet (90 mg total) by mouth 2 (two) times daily.  . Vitamin D, Ergocalciferol, (DRISDOL) 1.25 MG (50000 UT) CAPS capsule Take 50,000 Units by mouth every 7 (seven) days.      Allergies:   Shellfish allergy, Contrast media [iodinated diagnostic agents], Metformin and related, Cymbalta [duloxetine hcl], Ultram [tramadol], and Oxycontin [oxycodone hcl]   Social History   Socioeconomic History  . Marital status: Married    Spouse name: Not on file  . Number of children: Not on file  . Years of education: Not on file  . Highest education level: Not on file  Occupational History  . Occupation: disabled    Comment: jockey  Social Needs  . Financial  resource strain: Not on file  . Food insecurity    Worry: Not on file    Inability: Not on file  . Transportation needs    Medical: Not on file    Non-medical: Not on file  Tobacco Use  . Smoking status: Current Every Day Smoker    Packs/day: 1.00    Years: 30.00    Pack years: 30.00    Types: Cigarettes    Last attempt to quit: 06/04/2010    Years since quitting: 8.6  . Smokeless tobacco: Current User  Substance and Sexual Activity  . Alcohol use: No  . Drug use: No  . Sexual activity: Not on file  Lifestyle  . Physical activity    Days per week: Not on file    Minutes per  session: Not on file  . Stress: Not on file  Relationships  . Social Herbalist on phone: Not on file    Gets together: Not on file    Attends religious service: Not on file    Active member of club or organization: Not on file    Attends meetings of clubs or organizations: Not on file    Relationship status: Not on file  Other Topics Concern  . Not on file  Social History Narrative   Lives with Tashiana Siegle husband.   Disabled     Family History: The patient's family history includes Bone cancer in her father; Breast cancer in her sister; CAD in an other family member; Diabetes in an other family member; Hypertension in an other family member; Lung cancer in her father; Lung disease in her maternal uncle, maternal uncle, and mother; Lymphoma in her sister; Multiple sclerosis in her brother; Muscular dystrophy in her brother; Seizures in an other family member. There is no history of Colon cancer, Colon polyps, Esophageal cancer, Stomach cancer, or Rectal cancer.  ROS:   Please see the history of present illness.    All other systems reviewed and are negative.  EKGs/Labs/Other Studies Reviewed:    The following studies were reviewed today: Delfino Lovett, MD (Primary)    Procedures  INTRAVASCULAR PRESSURE WIRE/FFR STUDY  LEFT HEART CATH AND CORONARY ANGIOGRAPHY  Conclusion     Ost LAD lesion is 40% stenosed.  Previously placed Mid LAD drug eluting stent is widely patentwidely patent.  Ost 1st Mrg lesion is 60% stenosed.  Dist Cx lesion is 25% stenosed.  Prox RCA lesion is 25% stenosed.  The left ventricular systolic function is normal.  LV end diastolic pressure is normal.  The left ventricular ejection fraction is 55-65% by visual estimate.  There is no aortic valve stenosis.   LAD disease moderate at ostium but not significant by DFR.  Some moderate small branch vessel disease present.  Continue medical therapy and aggressive secondary prevention.         Recent Labs: 07/13/2018: Hemoglobin 13.1; Platelets 328 08/23/2018: ALT 21; BUN 8; Creatinine, Ser 0.73; Potassium 4.4; Sodium 144  Recent Lipid Panel    Component Value Date/Time   CHOL 153 08/23/2018 0912   TRIG 180 (H) 08/23/2018 0912   HDL 40 08/23/2018 0912   CHOLHDL 3.8 08/23/2018 0912   CHOLHDL 5.7 07/12/2018 0333   VLDL 29 07/12/2018 0333   LDLCALC 77 08/23/2018 0912    Physical Exam:    VS:  BP 120/70 (BP Location: Left Arm, Patient Position: Sitting, Cuff Size: Normal)   Pulse 81   Ht 5\' 5"  (1.651 m)   Wt 172 lb (78 kg)   SpO2 96%   BMI 28.62 kg/m     Wt Readings from Last 3 Encounters:  02/07/19 172 lb (78 kg)  01/21/19 176 lb (79.8 kg)  01/10/19 176 lb (79.8 kg)     GEN: Patient is in no acute distress HEENT: Normal NECK: No JVD; No carotid bruits LYMPHATICS: No lymphadenopathy CARDIAC: Hear sounds regular, 2/6 systolic murmur at the apex. RESPIRATORY:  Clear to auscultation without rales, wheezing or rhonchi  ABDOMEN: Soft, non-tender, non-distended MUSCULOSKELETAL:  No edema; No deformity  SKIN: Warm and dry NEUROLOGIC:  Alert and oriented x 3 PSYCHIATRIC:  Normal affect   Signed, Jenean Lindau, MD  02/07/2019 10:03 AM    [place]

## 2019-03-28 DIAGNOSIS — J323 Chronic sphenoidal sinusitis: Secondary | ICD-10-CM | POA: Diagnosis not present

## 2019-03-28 DIAGNOSIS — J343 Hypertrophy of nasal turbinates: Secondary | ICD-10-CM | POA: Diagnosis not present

## 2019-03-28 DIAGNOSIS — I2510 Atherosclerotic heart disease of native coronary artery without angina pectoris: Secondary | ICD-10-CM | POA: Diagnosis not present

## 2019-03-28 DIAGNOSIS — J322 Chronic ethmoidal sinusitis: Secondary | ICD-10-CM | POA: Diagnosis not present

## 2019-03-28 DIAGNOSIS — J3489 Other specified disorders of nose and nasal sinuses: Secondary | ICD-10-CM | POA: Diagnosis not present

## 2019-03-28 DIAGNOSIS — R0981 Nasal congestion: Secondary | ICD-10-CM | POA: Diagnosis not present

## 2019-03-28 DIAGNOSIS — J45909 Unspecified asthma, uncomplicated: Secondary | ICD-10-CM | POA: Diagnosis not present

## 2019-03-28 DIAGNOSIS — J449 Chronic obstructive pulmonary disease, unspecified: Secondary | ICD-10-CM | POA: Diagnosis not present

## 2019-03-28 DIAGNOSIS — J342 Deviated nasal septum: Secondary | ICD-10-CM | POA: Diagnosis not present

## 2019-04-02 DIAGNOSIS — I2510 Atherosclerotic heart disease of native coronary artery without angina pectoris: Secondary | ICD-10-CM | POA: Diagnosis not present

## 2019-04-02 DIAGNOSIS — R21 Rash and other nonspecific skin eruption: Secondary | ICD-10-CM | POA: Diagnosis not present

## 2019-04-18 DIAGNOSIS — Z6829 Body mass index (BMI) 29.0-29.9, adult: Secondary | ICD-10-CM | POA: Diagnosis not present

## 2019-04-18 DIAGNOSIS — E1169 Type 2 diabetes mellitus with other specified complication: Secondary | ICD-10-CM | POA: Diagnosis not present

## 2019-04-18 DIAGNOSIS — E782 Mixed hyperlipidemia: Secondary | ICD-10-CM | POA: Diagnosis not present

## 2019-04-18 DIAGNOSIS — E1121 Type 2 diabetes mellitus with diabetic nephropathy: Secondary | ICD-10-CM | POA: Diagnosis not present

## 2019-04-18 DIAGNOSIS — M0589 Other rheumatoid arthritis with rheumatoid factor of multiple sites: Secondary | ICD-10-CM | POA: Diagnosis not present

## 2019-04-18 DIAGNOSIS — E663 Overweight: Secondary | ICD-10-CM | POA: Diagnosis not present

## 2019-04-18 DIAGNOSIS — F17219 Nicotine dependence, cigarettes, with unspecified nicotine-induced disorders: Secondary | ICD-10-CM | POA: Diagnosis not present

## 2019-04-18 DIAGNOSIS — I119 Hypertensive heart disease without heart failure: Secondary | ICD-10-CM | POA: Diagnosis not present

## 2019-04-24 DIAGNOSIS — R05 Cough: Secondary | ICD-10-CM | POA: Diagnosis not present

## 2019-04-24 DIAGNOSIS — R0602 Shortness of breath: Secondary | ICD-10-CM | POA: Diagnosis not present

## 2019-04-24 DIAGNOSIS — R0781 Pleurodynia: Secondary | ICD-10-CM | POA: Diagnosis not present

## 2019-05-09 ENCOUNTER — Ambulatory Visit: Payer: Medicare Other | Admitting: Cardiology

## 2019-05-18 DIAGNOSIS — R071 Chest pain on breathing: Secondary | ICD-10-CM | POA: Diagnosis not present

## 2019-05-18 DIAGNOSIS — Z20828 Contact with and (suspected) exposure to other viral communicable diseases: Secondary | ICD-10-CM | POA: Diagnosis not present

## 2019-06-09 ENCOUNTER — Encounter: Payer: Self-pay | Admitting: Cardiology

## 2019-06-09 ENCOUNTER — Ambulatory Visit (INDEPENDENT_AMBULATORY_CARE_PROVIDER_SITE_OTHER): Payer: Medicare Other | Admitting: Cardiology

## 2019-06-09 ENCOUNTER — Other Ambulatory Visit: Payer: Self-pay

## 2019-06-09 VITALS — BP 138/66 | HR 86 | Ht 63.0 in | Wt 177.4 lb

## 2019-06-09 DIAGNOSIS — I2510 Atherosclerotic heart disease of native coronary artery without angina pectoris: Secondary | ICD-10-CM | POA: Diagnosis not present

## 2019-06-09 DIAGNOSIS — E088 Diabetes mellitus due to underlying condition with unspecified complications: Secondary | ICD-10-CM

## 2019-06-09 DIAGNOSIS — F17210 Nicotine dependence, cigarettes, uncomplicated: Secondary | ICD-10-CM | POA: Insufficient documentation

## 2019-06-09 DIAGNOSIS — G459 Transient cerebral ischemic attack, unspecified: Secondary | ICD-10-CM | POA: Diagnosis not present

## 2019-06-09 DIAGNOSIS — I214 Non-ST elevation (NSTEMI) myocardial infarction: Secondary | ICD-10-CM

## 2019-06-09 HISTORY — DX: Nicotine dependence, cigarettes, uncomplicated: F17.210

## 2019-06-09 NOTE — Progress Notes (Signed)
Cardiology Office Note:    Date:  06/09/2019   ID:  Brenda Cochran, DOB Aug 07, 1950, MRN [id number]  PCP:  Rochel Brome, MD  Cardiologist:  Jenean Lindau, MD   Referring MD: Rochel Brome, MD    ASSESSMENT:    1. NSTEMI (non-ST elevated myocardial infarction) ([place])   2. TIA (transient ischemic attack)   3. Coronary artery disease involving native coronary artery of native heart without angina pectoris   4. Diabetes mellitus due to underlying condition with unspecified complications ([place])    PLAN:    In order of problems listed above:  1. Coronary artery disease: Secondary prevention stressed with the patient.  Importance of compliance with diet and medication stressed and she vocalized understanding. 2. Essential hypertension: Blood pressure is stable 3. Mixed dyslipidemia and diabetes mellitus: Lab work was reviewed.  These are followed by primary care physician.  The numbers are not optimal and her primary care physician is making adjustments to her medicines.  Meanwhile I discussed the importance of diet and exercise. 4. Regular smoker: I spent 5 minutes with the patient discussing solely about smoking. Smoking cessation was counseled. I suggested to the patient also different medications and pharmacological interventions. Patient is keen to try stopping on its own at this time. He will get back to me if he needs any further assistance in this matter. 5. Patient will be seen in follow-up appointment in 6 months or earlier if the patient has any concerns    Medication Adjustments/Labs and Tests Ordered: Current medicines are reviewed at length with the patient today.  Concerns regarding medicines are outlined above.  No orders of the defined types were placed in this encounter.  No orders of the defined types were placed in this encounter.    No chief complaint on file.    History of Present Illness:    Brenda Cochran is a 69 y.o. female.  Patient has past medical history  of coronary artery disease and recently underwent coronary angiography for a non-STEMI.  Unfortunately she continues to smoke.  She has essential hypertension dyslipidemia and diabetes mellitus.  Lipids followed by her primary care physician.  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  At the time of my evaluation, the patient is alert awake oriented and in no distress.  Past Medical History:  Diagnosis Date  . Asthma   . COPD (chronic obstructive pulmonary disease) ([place])   . Depression   . Fibromyalgia   . GERD (gastroesophageal reflux disease)   . Hyperlipidemia   . Hypothyroidism (acquired)   . Osteoarthritis   . RA (rheumatoid arthritis) ([place])    [name]  . Skin cancer    face  . Sleep apnea    cpap  . Type 2 diabetes mellitus ([place])     Past Surgical History:  Procedure Laterality Date  . ABDOMINAL HYSTERECTOMY  1984  . BREAST LUMPECTOMY  1976   left, benign  . CHOLECYSTECTOMY  09/05/10  . CORONARY STENT INTERVENTION N/A 07/12/2018   Procedure: CORONARY STENT INTERVENTION;  Surgeon: Jettie Booze, MD;  [facility];  Service: Cardiovascular;  Laterality: N/A;  . CORONARY THROMBECTOMY N/A 07/12/2018   Procedure: Coronary Thrombectomy;  Surgeon: Jettie Booze, MD;  Location: [facility];  Service: Cardiovascular;  Laterality: N/A;  . INTRAVASCULAR PRESSURE WIRE/FFR STUDY N/A 01/21/2019   Procedure: INTRAVASCULAR PRESSURE WIRE/FFR STUDY;  Surgeon: Jettie Booze, MD;  Location: [facility]  [facility];  Service: Cardiovascular;  Laterality: N/A;  . LEFT HEART CATH AND CORONARY ANGIOGRAPHY N/A 07/12/2018   Procedure: LEFT HEART CATH AND CORONARY ANGIOGRAPHY;  Surgeon: Jettie Booze, MD;  Location: [facility];  Service: Cardiovascular;  Laterality: N/A;  . LEFT HEART CATH AND CORONARY ANGIOGRAPHY N/A 01/21/2019   Procedure: LEFT HEART CATH AND CORONARY ANGIOGRAPHY;  Surgeon: Jettie Booze, MD;  Location:  [facility];  Service: Cardiovascular;  Laterality: N/A;    Current Medications: Current Meds  Medication Sig  . albuterol (VENTOLIN HFA) 108 (90 Base) MCG/ACT inhaler Inhale 1-2 puffs into the lungs every 6 (six) hours as needed for wheezing or shortness of breath.  Marland Kitchen aspirin EC 81 MG tablet Take 1 tablet (81 mg total) by mouth daily.  . budesonide-formoterol (SYMBICORT) 160-4.5 MCG/ACT inhaler Inhale 1 puff into the lungs 2 (two) times daily as needed (shortness of breath).   . diphenhydrAMINE (BENADRYL) 50 MG tablet Take 1 tablet (50 mg total) by mouth at bedtime as needed for itching.  Marland Kitchen HYDROcodone-acetaminophen (NORCO/VICODIN) 5-325 MG tablet Take 1 tablet by mouth daily as needed (pain).   . Insulin Glargine (TOUJEO MAX SOLOSTAR [place]) Inject 10 Units into the skin daily.   Marland Kitchen leflunomide (ARAVA) 20 MG tablet Take 20 mg by mouth daily.  . metoprolol tartrate (LOPRESSOR) 25 MG tablet   . nitroGLYCERIN (NITROSTAT) 0.4 MG SL tablet Place 1 tablet (0.4 mg total) under the tongue every 5 (five) minutes x 3 doses as needed for chest pain.  . predniSONE (DELTASONE) 5 MG tablet Take 5 mg by mouth daily as needed (arthritis inflammation).   . predniSONE (DELTASONE) 50 MG tablet To be taken prior to cath procedure.  . ranolazine (RANEXA) 1000 MG SR tablet Take 0.5 tablets of 500 mg twice daily for 2 weeks then increase to 1 tablet twice daily thereafter.  . rosuvastatin (CRESTOR) 10 MG tablet Take 10 mg by mouth at bedtime.   . ticagrelor (BRILINTA) 90 MG TABS tablet Take 1 tablet (90 mg total) by mouth 2 (two) times daily.     Allergies:   Shellfish allergy, Contrast media [iodinated diagnostic agents], Metformin and related, Cymbalta [duloxetine hcl], Ultram [tramadol], and Oxycontin [oxycodone hcl]   Social History   Socioeconomic History  . Marital status: Married    Spouse name: Not on file  . Number of children: Not on file  . Years of education: Not on file  . Highest education  level: Not on file  Occupational History  . Occupation: disabled    Comment: jockey  Tobacco Use  . Smoking status: Current Every Day Smoker    Packs/day: 1.00    Years: 30.00    Pack years: 30.00    Types: Cigarettes    Last attempt to quit: 06/04/2010    Years since quitting: 9.0  . Smokeless tobacco: Current User  Substance and Sexual Activity  . Alcohol use: No  . Drug use: No  . Sexual activity: Not on file  Other Topics Concern  . Not on file  Social History Narrative   Lives with Ceriah Guibord husband.   Disabled   Social Determinants of Health   Financial Resource Strain:   . Difficulty of Paying Living Expenses: Not on file  Food Insecurity:   . Worried About Charity fundraiser in the Last Year: Not on file  . Ran Out of Food in the Last Year: Not on file  Transportation Needs:   .  Lack of Transportation (Medical): Not on file  . Lack of Transportation (Non-Medical): Not on file  Physical Activity:   . Days of Exercise per Week: Not on file  . Minutes of Exercise per Session: Not on file  Stress:   . Feeling of Stress : Not on file  Social Connections:   . Frequency of Communication with Friends and Family: Not on file  . Frequency of Social Gatherings with Friends and Family: Not on file  . Attends Religious Services: Not on file  . Active Member of Clubs or Organizations: Not on file  . Attends Archivist Meetings: Not on file  . Marital Status: Not on file     Family History: The patient's family history includes Bone cancer in her father; Breast cancer in her sister; CAD in an other family member; Diabetes in an other family member; Hypertension in an other family member; Lung cancer in her father; Lung disease in her maternal uncle, maternal uncle, and mother; Lymphoma in her sister; Multiple sclerosis in her brother; Muscular dystrophy in her brother; Seizures in an other family member. There is no history of Colon cancer, Colon polyps, Esophageal  cancer, Stomach cancer, or Rectal cancer.  ROS:   Please see the history of present illness.    All other systems reviewed and are negative.  EKGs/Labs/Other Studies Reviewed:    The following studies were reviewed today: Jettie Booze, MD Study date: 01/21/19  MyChart Results Release  MyChart Status: Pending Results Release  Physicians  Panel Physicians Referring Physician Case Authorizing Physician  Jettie Booze, MD (Primary)    Procedures  INTRAVASCULAR PRESSURE WIRE/FFR STUDY  LEFT HEART CATH AND CORONARY ANGIOGRAPHY  Conclusion    Ost LAD lesion is 40% stenosed.  Previously placed Mid LAD drug eluting stent is widely patentwidely patent.  Ost 1st Mrg lesion is 60% stenosed.  Dist Cx lesion is 25% stenosed.  Prox RCA lesion is 25% stenosed.  The left ventricular systolic function is normal.  LV end diastolic pressure is normal.  The left ventricular ejection fraction is 55-65% by visual estimate.  There is no aortic valve stenosis.   LAD disease moderate at ostium but not significant by DFR.  Some moderate small branch vessel disease present.  Continue medical therapy and aggressive secondary prevention.         Recent Labs: 07/13/2018: Hemoglobin 13.1; Platelets 328 08/23/2018: ALT 21; BUN 8; Creatinine, Ser 0.73; Potassium 4.4; Sodium 144  Recent Lipid Panel    Component Value Date/Time   CHOL 153 08/23/2018 0912   TRIG 180 (H) 08/23/2018 0912   HDL 40 08/23/2018 0912   CHOLHDL 3.8 08/23/2018 0912   CHOLHDL 5.7 07/12/2018 0333   VLDL 29 07/12/2018 0333   LDLCALC 77 08/23/2018 0912    Physical Exam:    VS:  BP 138/66 (BP Location: Left Arm, Patient Position: Sitting, Cuff Size: Normal)   Pulse 86   Ht 5\' 3"  (1.6 m)   Wt 177 lb 6.4 oz (80.5 kg)   SpO2 98%   BMI 31.42 kg/m     Wt Readings from Last 3 Encounters:  06/09/19 177 lb 6.4 oz (80.5 kg)  02/07/19 172 lb (78 kg)  01/21/19 176 lb (79.8 kg)     GEN: Patient is in  no acute distress HEENT: Normal NECK: No JVD; No carotid bruits LYMPHATICS: No lymphadenopathy CARDIAC: Hear sounds regular, 2/6 systolic murmur at the apex. RESPIRATORY:  Clear to auscultation without rales, wheezing or  rhonchi  ABDOMEN: Soft, non-tender, non-distended MUSCULOSKELETAL:  No edema; No deformity  SKIN: Warm and dry NEUROLOGIC:  Alert and oriented x 3 PSYCHIATRIC:  Normal affect   Signed, Jenean Lindau, MD  06/09/2019 11:43 AM    [place]

## 2019-06-09 NOTE — Patient Instructions (Signed)

## 2019-06-21 DIAGNOSIS — R05 Cough: Secondary | ICD-10-CM | POA: Diagnosis not present

## 2019-06-23 DIAGNOSIS — R05 Cough: Secondary | ICD-10-CM | POA: Diagnosis not present

## 2019-07-02 DIAGNOSIS — E1121 Type 2 diabetes mellitus with diabetic nephropathy: Secondary | ICD-10-CM | POA: Diagnosis not present

## 2019-07-02 DIAGNOSIS — R197 Diarrhea, unspecified: Secondary | ICD-10-CM | POA: Diagnosis not present

## 2019-07-02 DIAGNOSIS — J018 Other acute sinusitis: Secondary | ICD-10-CM | POA: Diagnosis not present

## 2019-07-02 DIAGNOSIS — R05 Cough: Secondary | ICD-10-CM | POA: Diagnosis not present

## 2019-07-02 DIAGNOSIS — R112 Nausea with vomiting, unspecified: Secondary | ICD-10-CM | POA: Diagnosis not present

## 2019-07-25 ENCOUNTER — Ambulatory Visit: Payer: Medicare Other | Admitting: Family Medicine

## 2019-07-27 DIAGNOSIS — R509 Fever, unspecified: Secondary | ICD-10-CM | POA: Diagnosis not present

## 2019-07-27 DIAGNOSIS — J449 Chronic obstructive pulmonary disease, unspecified: Secondary | ICD-10-CM | POA: Diagnosis not present

## 2019-07-27 DIAGNOSIS — R0981 Nasal congestion: Secondary | ICD-10-CM | POA: Diagnosis not present

## 2019-07-27 DIAGNOSIS — Z20828 Contact with and (suspected) exposure to other viral communicable diseases: Secondary | ICD-10-CM | POA: Diagnosis not present

## 2019-07-28 ENCOUNTER — Other Ambulatory Visit: Payer: Self-pay | Admitting: Family Medicine

## 2019-08-07 ENCOUNTER — Ambulatory Visit (INDEPENDENT_AMBULATORY_CARE_PROVIDER_SITE_OTHER): Payer: Medicare Other | Admitting: Family Medicine

## 2019-08-07 ENCOUNTER — Encounter: Payer: Self-pay | Admitting: Family Medicine

## 2019-08-07 ENCOUNTER — Other Ambulatory Visit: Payer: Self-pay

## 2019-08-07 VITALS — BP 110/72 | HR 88 | Temp 97.7°F | Resp 16 | Ht 65.0 in | Wt 169.0 lb

## 2019-08-07 DIAGNOSIS — E785 Hyperlipidemia, unspecified: Secondary | ICD-10-CM | POA: Diagnosis not present

## 2019-08-07 DIAGNOSIS — M05749 Rheumatoid arthritis with rheumatoid factor of unspecified hand without organ or systems involvement: Secondary | ICD-10-CM

## 2019-08-07 DIAGNOSIS — E782 Mixed hyperlipidemia: Secondary | ICD-10-CM

## 2019-08-07 DIAGNOSIS — G44221 Chronic tension-type headache, intractable: Secondary | ICD-10-CM

## 2019-08-07 DIAGNOSIS — J45901 Unspecified asthma with (acute) exacerbation: Secondary | ICD-10-CM

## 2019-08-07 DIAGNOSIS — J441 Chronic obstructive pulmonary disease with (acute) exacerbation: Secondary | ICD-10-CM | POA: Diagnosis not present

## 2019-08-07 DIAGNOSIS — F17219 Nicotine dependence, cigarettes, with unspecified nicotine-induced disorders: Secondary | ICD-10-CM | POA: Diagnosis not present

## 2019-08-07 DIAGNOSIS — E039 Hypothyroidism, unspecified: Secondary | ICD-10-CM

## 2019-08-07 DIAGNOSIS — F17210 Nicotine dependence, cigarettes, uncomplicated: Secondary | ICD-10-CM | POA: Diagnosis not present

## 2019-08-07 DIAGNOSIS — E1169 Type 2 diabetes mellitus with other specified complication: Secondary | ICD-10-CM

## 2019-08-07 HISTORY — DX: Chronic tension-type headache, intractable: G44.221

## 2019-08-07 MED ORDER — PREDNISONE 5 MG PO TABS: 5.0000 mg | ORAL_TABLET | Freq: Every day | ORAL | 0 refills | Status: DC | PRN

## 2019-08-07 MED ORDER — BREZTRI AEROSPHERE 160-9-4.8 MCG/ACT IN AERO: 2.0000 | INHALATION_SPRAY | Freq: Two times a day (BID) | RESPIRATORY_TRACT | 3 refills | Status: DC

## 2019-08-07 MED ORDER — VARENICLINE TARTRATE 0.5 MG PO TABS: 0.5000 mg | ORAL_TABLET | Freq: Two times a day (BID) | ORAL | 0 refills | Status: DC

## 2019-08-07 MED ORDER — KETOROLAC TROMETHAMINE 60 MG/2ML IM SOLN
60.0000 mg | Freq: Once | INTRAMUSCULAR | Status: AC
  Administered 2019-08-07: 60 mg via INTRAMUSCULAR

## 2019-08-07 NOTE — Progress Notes (Signed)
Subjective:  Patient ID: Brenda Cochran, female    DOB: 08-Oct-1950  Age: 69 y.o. MRN: [id number]  Chief Complaint  Patient presents with  . Diabetes  . Hyperlipidemia  . Hypothyroidism    HPI  Patient c/o persistent sob/cough for 3 months. Went to urgent care over the weekend of February 19th and was tested for covid 19, flu, and cxr. All were normal. Pt was given zpack, tessalon perles, and albuterol MDI at least twice a day. Has improved, but not gone. Patient has continued to smoke. She would like to quit smoking and would like to restart chantix. Smoking 1 ppd.  Diabetes is doing well. Sugars less than 150. Checks daily. Checking feet daily.  Hyperlipidemia: Patient is taking crestor. She is trying to eat healthy.  CAD with angina: Patient is taking ranexa, aspirin and metoprolol.  Denies chest pain.     Social History   Socioeconomic History  . Marital status: Married    Spouse name: Not on file  . Number of children: 6  . Years of education: Not on file  . Highest education level: Not on file  Occupational History  . Occupation: disabled    Comment: jockey  Tobacco Use  . Smoking status: Current Every Day Smoker    Packs/day: 1.00    Years: 30.00    Pack years: 30.00    Types: Cigarettes    Last attempt to quit: 06/04/2010    Years since quitting: 9.2  . Smokeless tobacco: Current User  Substance and Sexual Activity  . Alcohol use: No  . Drug use: No  . Sexual activity: Not on file  Other Topics Concern  . Not on file  Social History Narrative   Lives with Amity Schiek husband.   Disabled   Social Determinants of Health   Financial Resource Strain:   . Difficulty of Paying Living Expenses:   Food Insecurity:   . Worried About Charity fundraiser in the Last Year:   . Arboriculturist in the Last Year:   Transportation Needs:   . Film/video editor (Medical):   Marland Kitchen Lack of Transportation (Non-Medical):   Physical Activity:   . Days of Exercise per Week:    . Minutes of Exercise per Session:   Stress:   . Feeling of Stress :   Social Connections:   . Frequency of Communication with Friends and Family:   . Frequency of Social Gatherings with Friends and Family:   . Attends Religious Services:   . Active Member of Clubs or Organizations:   . Attends Archivist Meetings:   Marland Kitchen Marital Status:    Past Medical History:  Diagnosis Date  . Asthma   . Atherosclerotic heart disease of native coronary artery without angina pectoris   . Chronic maxillary sinusitis   . COPD (chronic obstructive pulmonary disease) ([place])   . Depression   . Fibromyalgia   . GERD (gastroesophageal reflux disease)   . Headache   . Hyperlipidemia   . Hypertensive heart disease without heart failure   . Hypothyroidism (acquired)   . Major depressive disorder, single episode, mild ([place])   . Menopausal and female climacteric states   . Nicotine dependence, cigarettes, with unspecified nicotine-induced disorders   . Osteoarthritis   . Overweight   . RA (rheumatoid arthritis) ([place])    [name]  . Skin cancer    face  . Sleep apnea    cpap  . Type 2 diabetes  mellitus ([place])   . Type 2 diabetes mellitus with diabetic nephropathy ([place])   . Vitamin D deficiency, unspecified    Family History  Problem Relation Age of Onset  . Breast cancer Sister   . Lymphoma Sister   . Multiple sclerosis Brother   . Muscular dystrophy Brother   . CAD Other   . Diabetes Other   . Hypertension Other   . Seizures Other   . Lung disease Mother        never smoker  . Lung disease Maternal Uncle   . Lung disease Maternal Uncle   . Lung cancer Father   . Bone cancer Father   . Colon cancer Neg Hx   . Colon polyps Neg Hx   . Esophageal cancer Neg Hx   . Stomach cancer Neg Hx   . Rectal cancer Neg Hx     Review of Systems  Constitutional: Positive for fatigue. Negative for chills and fever.  HENT: Positive for congestion. Negative for ear pain and sore throat.         Deviated septum. Takes flonase.   Respiratory: Positive for cough, chest tightness, shortness of breath and wheezing.   Cardiovascular: Negative for chest pain and leg swelling.  Gastrointestinal: Negative for abdominal pain, constipation, diarrhea, nausea and vomiting.  Endocrine: Positive for polydipsia. Negative for polyphagia and polyuria.       Due to dry cough.   Genitourinary: Negative for dysuria and urgency.  Musculoskeletal: Positive for myalgias. Negative for arthralgias.       Spasms in rt ribcage and in thoracic back. Chest wall is tender to touch   Neurological: Positive for headaches. Negative for dizziness.       Left temple radiates to left eye for 3 days. Tried tylenol, aleve, and ibuprofen without help. Even tried a goodies powder which did not help.   Psychiatric/Behavioral: Negative for dysphoric mood. The patient is not nervous/anxious.        Irritable.      Objective:  BP 110/72   Pulse 88   Temp 97.7 F (36.5 C)   Resp 16   Ht 5\' 5"  (1.651 m)   Wt 169 lb (76.7 kg)   BMI 28.12 kg/m   BP/Weight 08/15/2019 [id number]  Systolic BP [id number]  Diastolic BP 82 72 66  Wt. (Lbs) 172 169 177.4  BMI 28.62 28.12 31.42    Physical Exam Vitals reviewed.  Constitutional:      General: She is not in acute distress.    Appearance: Normal appearance.  HENT:     Nose: No congestion or rhinorrhea.     Mouth/Throat:     Mouth: Mucous membranes are moist.     Pharynx: No oropharyngeal exudate or posterior oropharyngeal erythema.  Neck:     Thyroid: No thyroid mass.  Cardiovascular:     Rate and Rhythm: Normal rate and regular rhythm.     Heart sounds: No murmur.  Pulmonary:     Effort: Pulmonary effort is normal.     Breath sounds: Normal breath sounds.     Comments: Coughs with deep breaths.  Abdominal:     Palpations: Abdomen is soft. There is no mass.     Tenderness: There is no abdominal tenderness.  Neurological:     Mental Status: She  is alert and oriented to person, place, and time.     Cranial Nerves: No cranial nerve deficit.  Psychiatric:        Mood and  Affect: Mood normal.        Behavior: Behavior normal.     Lab Results  Component Value Date   WBC 8.0 08/15/2019   HGB 14.3 08/15/2019   HCT 43.0 08/15/2019   PLT 300 08/15/2019   GLUCOSE 168 (H) 08/15/2019   CHOL 118 08/15/2019   TRIG 72 08/15/2019   HDL 46 08/15/2019   LDLCALC 57 08/15/2019   ALT 11 08/15/2019   AST 14 08/15/2019   NA 142 08/15/2019   K 4.3 08/15/2019   CL 104 08/15/2019   CREATININE 0.71 08/15/2019   BUN 10 08/15/2019   CO2 23 08/15/2019   INR 1.05 07/12/2018   HGBA1C 6.4 (H) 08/15/2019      Assessment & Plan:  Hypothyroidism (acquired) Continue current dose of Synthroid.  Well-controlled.  Dyslipidemia associated with type 2 diabetes mellitus ([place]) Diabetic control is sporadic.  Patient will at times go off of her medications and her diet resulting in very high A1c's and sugars and other times she does very well. At this point no changes to her medications.   Continue to work on eating a healthy diet and exercise.  Labs ordered for later this month.  Chronic tension-type headache, intractable Recommend Tylenol.  Recommend rest, healthy diet, and exercise. Toradol shot given.  RA (rheumatoid arthritis) ([place]) Patient requested low-dose prednisone for flareups.  I agreed to give for her a very limited number and strongly encouraged patient to return to her rheumatologist.  Mixed hyperlipidemia   No changes to medicines.  Continue to work on eating a healthy diet and exercise.  Labs to be drawn later this month.  Continuous dependence on cigarette smoking Strongly recommend tobacco cessation.  Order Chantix.  Asthma, chronic obstructive, with acute exacerbation Change Symbicort to breztri 2 puffs twice a day.  Patient had requested Symbicort samples but we do not have any.  The fact that she has run out of Symbicort  and due to cost is not able to refill it may be contributing to her persistent bronchial symptoms. REFER TO [place] ordered this encounter  Medications  . varenicline (CHANTIX) 0.5 MG tablet    Sig: Take 1 tablet (0.5 mg total) by mouth 2 (two) times daily.    Dispense:  60 tablet    Refill:  0  . predniSONE (DELTASONE) 5 MG tablet    Sig: Take 1 tablet (5 mg total) by mouth daily as needed for up to 10 doses.    Dispense:  10 tablet    Refill:  0  . Budeson-Glycopyrrol-Formoterol (BREZTRI AEROSPHERE) 160-9-4.8 MCG/ACT AERO    Sig: Inhale 2 puffs into the lungs 2 (two) times daily.    Dispense:  10.7 g    Refill:  3  . ketorolac (TORADOL) injection 60 mg       Follow-up: Return in about 1 week (around 08/14/2019) for [place]. FOLLOW UP IN 4 MONTHS FOR DIABETES FOLLOW UP. Marland Kitchen  AVS was given to patient prior to departure.  Rochel Brome [facility] [phone]

## 2019-08-07 NOTE — Patient Instructions (Addendum)
STOP SYMBICORT.  START BREZTRI 2 PUFFS TWICE A DAY.  REFER TO [facility] RETURN FOR LABWORK PREDNISONE RX GIVEN FOR 5 MG ONE DAILY AS NEEDED FOR RHEUMATOID FLARE UP. I WILL ONLY FILL THIS ONCE AS PT IS OVERDUE FOR RHEUMATOLOGY FOLLOW UP.  START CHANTIX 0.5 MG ONCE DAILY IN AM WITH FOOD X 3 DAYS, THEN INCREASE TO TWICE A DAY. AFTER ONE WEEK INCREASE TO 1 MG ONE TWICE A DAY.   Smoking Tobacco Information, Adult Smoking tobacco can be harmful to your health. Tobacco contains a poisonous (toxic), colorless chemical called nicotine. Nicotine is addictive. It changes the brain and can make it hard to stop smoking. Tobacco also has other toxic chemicals that can hurt your body and raise your risk of many cancers. How can smoking tobacco affect me? Smoking tobacco puts you at risk for:  Cancer. Smoking is most commonly associated with lung cancer, but can also lead to cancer in other parts of the body.  Chronic obstructive pulmonary disease (COPD). This is a long-term lung condition that makes it hard to breathe. It also gets worse over time.  High blood pressure (hypertension), heart disease, stroke, or heart attack.  Lung infections, such as pneumonia.  Cataracts. This is when the lenses in the eyes become clouded.  Digestive problems. This may include peptic ulcers, heartburn, and gastroesophageal reflux disease (GERD).  Oral health problems, such as gum disease and tooth loss.  Loss of taste and smell. Smoking can affect your appearance by causing:  Wrinkles.  Yellow or stained teeth, fingers, and fingernails. Smoking tobacco can also affect your social life, because:  It may be challenging to find places to smoke when away from home. Many workplaces, [employer], hotels, and public places are tobacco-free.  Smoking is expensive. This is due to the cost of tobacco and the long-term costs of treating health problems from smoking.  Secondhand smoke may affect those around  you. Secondhand smoke can cause lung cancer, breathing problems, and heart disease. Children of smokers have a higher risk for: ? Sudden infant death syndrome (SIDS). ? Ear infections. ? Lung infections. If you currently smoke tobacco, quitting now can help you:  Lead a longer and healthier life.  Look, smell, breathe, and feel better over time.  Save money.  Protect others from the harms of secondhand smoke. What actions can I take to prevent health problems? Quit smoking   Do not start smoking. Quit if you already do.  Make a plan to quit smoking and commit to it. Look for programs to help you and ask your health care provider for recommendations and ideas.  Set a date and write down all the reasons you want to quit.  Let your friends and family know you are quitting so they can help and support you. Consider finding friends who also want to quit. It can be easier to quit with someone else, so that you can support each other.  Talk with your health care provider about using nicotine replacement medicines to help you quit, such as gum, lozenges, patches, sprays, or pills.  Do not replace cigarette smoking with electronic cigarettes, which are commonly called e-cigarettes. The safety of e-cigarettes is not known, and some may contain harmful chemicals.  If you try to quit but return to smoking, stay positive. It is common to slip up when you first quit, so take it one day at a time.  Be prepared for cravings. When you feel the urge to smoke, chew gum  or suck on hard candy. Lifestyle  Stay busy and take care of your body.  Drink enough fluid to keep your urine pale yellow.  Get plenty of exercise and eat a healthy diet. This can help prevent weight gain after quitting.  Monitor your eating habits. Quitting smoking can cause you to have a larger appetite than when you smoke.  Find ways to relax. Go out with friends or family to a movie or a restaurant where people do not  smoke.  Ask your health care provider about having regular tests (screenings) to check for cancer. This may include blood tests, imaging tests, and other tests.  Find ways to manage your stress, such as meditation, yoga, or exercise. Where to find support To get support to quit smoking, consider:  Asking your health care provider for more information and resources.  Taking classes to learn more about quitting smoking.  Looking for local organizations that offer resources about quitting smoking.  Joining a support group for people who want to quit smoking in your local community.  Calling the smokefree.gov counselor helpline: 1-800-Quit-Now (713)800-6104) Where to find more information You may find more information about quitting smoking from:  HelpGuide.org: www.helpguide.org  https://hall.com/: smokefree.gov  American Lung Association: www.lung.org Contact a health care provider if you:  Have problems breathing.  Notice that your lips, nose, or fingers turn blue.  Have chest pain.  Are coughing up blood.  Feel faint or you pass out.  Have other health changes that cause you to worry. Summary  Smoking tobacco can negatively affect your health, the health of those around you, your finances, and your social life.  Do not start smoking. Quit if you already do. If you need help quitting, ask your health care provider.  Think about joining a support group for people who want to quit smoking in your local community. There are many effective programs that will help you to quit this behavior. This information is not intended to replace advice given to you by your health care provider. Make sure you discuss any questions you have with your health care provider. Document Revised: 02/14/2019 Document Reviewed: 06/06/2016 Elsevier Patient Education  2020 [employer].

## 2019-08-15 ENCOUNTER — Ambulatory Visit (INDEPENDENT_AMBULATORY_CARE_PROVIDER_SITE_OTHER): Payer: Medicare Other

## 2019-08-15 ENCOUNTER — Other Ambulatory Visit: Payer: Medicare Other

## 2019-08-15 ENCOUNTER — Other Ambulatory Visit: Payer: Self-pay | Admitting: Family Medicine

## 2019-08-15 ENCOUNTER — Ambulatory Visit (INDEPENDENT_AMBULATORY_CARE_PROVIDER_SITE_OTHER): Payer: Medicare Other | Admitting: Cardiology

## 2019-08-15 ENCOUNTER — Other Ambulatory Visit: Payer: Self-pay

## 2019-08-15 ENCOUNTER — Encounter: Payer: Self-pay | Admitting: Cardiology

## 2019-08-15 VITALS — BP 112/82 | HR 72 | Ht 65.0 in | Wt 172.0 lb

## 2019-08-15 DIAGNOSIS — E785 Hyperlipidemia, unspecified: Secondary | ICD-10-CM

## 2019-08-15 DIAGNOSIS — I10 Essential (primary) hypertension: Secondary | ICD-10-CM

## 2019-08-15 DIAGNOSIS — E1169 Type 2 diabetes mellitus with other specified complication: Secondary | ICD-10-CM

## 2019-08-15 DIAGNOSIS — F17210 Nicotine dependence, cigarettes, uncomplicated: Secondary | ICD-10-CM

## 2019-08-15 DIAGNOSIS — G4733 Obstructive sleep apnea (adult) (pediatric): Secondary | ICD-10-CM

## 2019-08-15 DIAGNOSIS — R002 Palpitations: Secondary | ICD-10-CM | POA: Insufficient documentation

## 2019-08-15 DIAGNOSIS — E782 Mixed hyperlipidemia: Secondary | ICD-10-CM

## 2019-08-15 DIAGNOSIS — I2510 Atherosclerotic heart disease of native coronary artery without angina pectoris: Secondary | ICD-10-CM

## 2019-08-15 DIAGNOSIS — E038 Other specified hypothyroidism: Secondary | ICD-10-CM | POA: Diagnosis not present

## 2019-08-15 HISTORY — DX: Palpitations: R00.2

## 2019-08-15 NOTE — Patient Instructions (Signed)
Medication Instructions:  No  Medication changes *If you need a refill on your cardiac medications before your next appointment, please call your pharmacy*   Lab Work: None ordered If you have labs (blood work) drawn today and your tests are completely normal, you will receive your results only by: Marland Kitchen MyChart Message (if you have MyChart) OR . A paper copy in the mail If you have any lab test that is abnormal or we need to change your treatment, we will call you to review the results.   Testing/Procedures:  WHY IS MY DOCTOR PRESCRIBING ZIO? The Zio system is proven and trusted by physicians to detect and diagnose irregular heart rhythms -- and has been prescribed to hundreds of thousands of patients.  The FDA has cleared the Zio system to monitor for many different kinds of irregular heart rhythms. In a study, physicians were able to reach a diagnosis 90% of the time with the Zio system1.  You can wear the Zio monitor -- a small, discreet, comfortable patch -- during your normal day-to-day activity, including while you sleep, shower, and exercise, while it records every single heartbeat for analysis.  1Barrett, P., et al. Comparison of 24 Hour Holter Monitoring Versus 14 Day Novel Adhesive Patch Electrocardiographic Monitoring. [place], 2014.  ZIO VS. HOLTER MONITORING The Zio monitor can be comfortably worn for up to 14 days. Holter monitors can be worn for 24 to 48 hours, limiting the time to record any irregular heart rhythms you may have. Zio is able to capture data for the 51% of patients who have their first symptom-triggered arrhythmia after 48 hours.1  LIVE WITHOUT RESTRICTIONS The Zio ambulatory cardiac monitor is a small, unobtrusive, and water-resistant patch--you might even forget you're wearing it. The Zio monitor records and stores every beat of your heart, whether you're sleeping, working out, or showering.  Wear the monitor for 14 days. Remove  08/29/19.   Follow-Up: At [facility], you and your health needs are our priority.  As part of our continuing mission to provide you with exceptional heart care, we have created designated Provider Care Teams.  These Care Teams include your primary Cardiologist (physician) and Advanced Practice Providers (APPs -  Physician Assistants and Nurse Practitioners) who all work together to provide you with the care you need, when you need it.  We recommend signing up for the patient portal called "MyChart".  Sign up information is provided on this After Visit Summary.  MyChart is used to connect with patients for Virtual Visits (Telemedicine).  Patients are able to view lab/test results, encounter notes, upcoming appointments, etc.  Non-urgent messages can be sent to your provider as well.   To learn more about what you can do with MyChart, go to [email or web address].    Your next appointment:   3 month(s)  The format for your next appointment:   In Person  Provider:   Jyl Heinz, MD   Other Instructions NA

## 2019-08-15 NOTE — Progress Notes (Signed)
Cardiology Office Note:    Date:  08/15/2019   ID:  [name], DOB 06/12/1950, MRN [id number]  PCP:  Rochel Brome, MD  Cardiologist:  Jenean Lindau, MD   Referring MD: Rochel Brome, MD    ASSESSMENT:    1. Coronary artery disease involving native coronary artery of native heart without angina pectoris   2. Essential hypertension   3. Mixed hyperlipidemia   4. Continuous dependence on cigarette smoking   5. Dyslipidemia associated with type 2 diabetes mellitus ([place])   6. OSA (obstructive sleep apnea)   7. Palpitations    PLAN:    In order of problems listed above:  1. Coronary artery disease: Secondary prevention stressed with the patient.  Importance of compliance with diet and medication stressed and he vocalized understanding. 2. Essential hypertension: Blood pressure is stable 3. Mixed dyslipidemia: She had blood work this morning at her primary care physician's office and she will send [place] a copy. 4. Diabetes mellitus: Diet was emphasized and she promises to do better. 5. Cigarette smoker: I spent 5 minutes with the patient discussing solely about smoking. Smoking cessation was counseled. I suggested to the patient also different medications and pharmacological interventions. Patient is keen to try stopping on its own at this time. He will get back to me if he needs any further assistance in this matter. 6. Palpitations: She gives history of palpitations and she will undergo ZIO monitoring for 2 weeks.  Her palpitations occur 2-3 times a week. 7. Patient will be seen in follow-up appointment in 3 months or earlier if the patient has any concerns    Medication Adjustments/Labs and Tests Ordered: Current medicines are reviewed at length with the patient today.  Concerns regarding medicines are outlined above.  Orders Placed This Encounter  Procedures  . LONG TERM MONITOR (3-14 DAYS)  . EKG 12-Lead   No orders of the defined types were placed in this  encounter.    Chief Complaint  Patient presents with  . Follow-up     History of Present Illness:    Brenda Cochran is a 69 y.o. female.  Patient has past medical history of coronary artery disease, essential hypertension dyslipidemia and diabetes mellitus.  Unfortunately she continues to smoke.  She complains of chest pain occasionally.  This chest pain is very not similar to the chest pain that she has before coronary stenting.  She denies any other problems.  Unfortunately as I mentioned she continues to smoke and leads a sedentary lifestyle.  At the time of my evaluation, the patient is alert awake oriented and in no distress.  Past Medical History:  Diagnosis Date  . Asthma   . Atherosclerotic heart disease of native coronary artery without angina pectoris   . Chronic maxillary sinusitis   . COPD (chronic obstructive pulmonary disease) ([place])   . Depression   . Fibromyalgia   . GERD (gastroesophageal reflux disease)   . Headache   . Hyperlipidemia   . Hypertensive heart disease without heart failure   . Hypothyroidism (acquired)   . Major depressive disorder, single episode, mild ([place])   . Menopausal and female climacteric states   . Nicotine dependence, cigarettes, with unspecified nicotine-induced disorders   . Osteoarthritis   . Overweight   . RA (rheumatoid arthritis) ([place])    [name]  . Skin cancer    face  . Sleep apnea    cpap  . Type 2 diabetes mellitus ([place] )   . Type  2 diabetes mellitus with diabetic nephropathy ([place])   . Vitamin D deficiency, unspecified     Past Surgical History:  Procedure Laterality Date  . ABDOMINAL HYSTERECTOMY  1984  . BREAST LUMPECTOMY  1976   left, benign  . CHOLECYSTECTOMY  09/05/10  . CORONARY STENT INTERVENTION N/A 07/12/2018   Procedure: CORONARY STENT INTERVENTION;  Surgeon: Jettie Booze, MD;  Location: [facility];  Service: Cardiovascular;  Laterality: N/A;  . CORONARY THROMBECTOMY N/A 07/12/2018    Procedure: Coronary Thrombectomy;  Surgeon: Jettie Booze, MD;  Location: [facility];  Service: Cardiovascular;  Laterality: N/A;  . INTRAVASCULAR PRESSURE WIRE/FFR STUDY N/A 01/21/2019   Procedure: INTRAVASCULAR PRESSURE WIRE/FFR STUDY;  Surgeon: Jettie Booze, MD;  Location: [facility];  Service: Cardiovascular;  Laterality: N/A;  . LEFT HEART CATH AND CORONARY ANGIOGRAPHY N/A 07/12/2018   Procedure: LEFT HEART CATH AND CORONARY ANGIOGRAPHY;  Surgeon: Jettie Booze, MD;  Location: [facility];  Service: Cardiovascular;  Laterality: N/A;  . LEFT HEART CATH AND CORONARY ANGIOGRAPHY N/A 01/21/2019   Procedure: LEFT HEART CATH AND CORONARY ANGIOGRAPHY;  Surgeon: Jettie Booze, MD;  Location: [facility];  Service: Cardiovascular;  Laterality: N/A;    Current Medications: Current Meds  Medication Sig  . albuterol (VENTOLIN HFA) 108 (90 Base) MCG/ACT inhaler Inhale 1-2 puffs into the lungs every 6 (six) hours as needed for wheezing or shortness of breath.  Marland Kitchen aspirin EC 81 MG tablet Take 81 mg by mouth daily.  . Budeson-Glycopyrrol-Formoterol (BREZTRI AEROSPHERE) 160-9-4.8 MCG/ACT AERO Inhale 2 puffs into the lungs 2 (two) times daily.  Marland Kitchen leflunomide (ARAVA) 20 MG tablet Take 20 mg by mouth daily.  . metoprolol tartrate (LOPRESSOR) 25 MG tablet TAKE 1/2 TABLET BY MOUTH TWICE A DAY  . nitroGLYCERIN (NITROSTAT) 0.4 MG SL tablet Place 1 tablet (0.4 mg total) under the tongue every 5 (five) minutes x 3 doses as needed for chest pain.  Marland Kitchen OZEMPIC, 1 MG/DOSE, 2 MG/1.5ML SOPN Inject 1 mg into the skin once a week.  . predniSONE (DELTASONE) 5 MG tablet Take 1 tablet (5 mg total) by mouth daily as needed for up to 10 doses.  . ranolazine (RANEXA) 1000 MG SR tablet Take 0.5 tablets of 500 mg twice daily for 2 weeks then increase to 1 tablet twice daily thereafter.  . rosuvastatin (CRESTOR) 10 MG tablet TAKE 1 TABLET AT NIGHT FOR HIGH CHOLESTEROL  .  ticagrelor (BRILINTA) 90 MG TABS tablet Take 1 tablet (90 mg total) by mouth 2 (two) times daily.     Allergies:   Shellfish allergy, Contrast media [iodinated diagnostic agents], Metformin and related, Amitriptyline hcl, Cymbalta [duloxetine hcl], Ultram [tramadol], and Oxycontin [oxycodone hcl]   Social History   Socioeconomic History  . Marital status: Married    Spouse name: Not on file  . Number of children: 6  . Years of education: Not on file  . Highest education level: Not on file  Occupational History  . Occupation: disabled    Comment: jockey  Tobacco Use  . Smoking status: Current Every Day Smoker    Packs/day: 1.00    Years: 30.00    Pack years: 30.00    Types: Cigarettes    Last attempt to quit: 06/04/2010    Years since quitting: 9.2  . Smokeless tobacco: Current User  Substance and Sexual Activity  . Alcohol use: No  . Drug use: No  . Sexual activity: Not  on file  Other Topics Concern  . Not on file  Social History Narrative   Lives with Lamiah Shah husband.   Disabled   Social Determinants of Health   Financial Resource Strain:   . Difficulty of Paying Living Expenses:   Food Insecurity:   . Worried About Charity fundraiser in the Last Year:   . Arboriculturist in the Last Year:   Transportation Needs:   . Film/video editor (Medical):   Marland Kitchen Lack of Transportation (Non-Medical):   Physical Activity:   . Days of Exercise per Week:   . Minutes of Exercise per Session:   Stress:   . Feeling of Stress :   Social Connections:   . Frequency of Communication with Friends and Family:   . Frequency of Social Gatherings with Friends and Family:   . Attends Religious Services:   . Active Member of Clubs or Organizations:   . Attends Archivist Meetings:   Marland Kitchen Marital Status:      Family History: The patient's family history includes Bone cancer in her father; Breast cancer in her sister; CAD in an other family member; Diabetes in an other  family member; Hypertension in an other family member; Lung cancer in her father; Lung disease in her maternal uncle, maternal uncle, and mother; Lymphoma in her sister; Multiple sclerosis in her brother; Muscular dystrophy in her brother; Seizures in an other family member. There is no history of Colon cancer, Colon polyps, Esophageal cancer, Stomach cancer, or Rectal cancer.  ROS:   Please see the history of present illness.    All other systems reviewed and are negative.  EKGs/Labs/Other Studies Reviewed:    The following studies were reviewed today: EKG reveals sinus rhythm and nonspecific ST-T changes.   Recent Labs: 08/23/2018: ALT 21; BUN 8; Creatinine, Ser 0.73; Potassium 4.4; Sodium 144  Recent Lipid Panel    Component Value Date/Time   CHOL 153 08/23/2018 0912   TRIG 180 (H) 08/23/2018 0912   HDL 40 08/23/2018 0912   CHOLHDL 3.8 08/23/2018 0912   CHOLHDL 5.7 07/12/2018 0333   VLDL 29 07/12/2018 0333   LDLCALC 77 08/23/2018 0912    Physical Exam:    VS:  BP 112/82   Pulse 72   Ht 5\' 5"  (1.651 m)   Wt 172 lb (78 kg)   SpO2 98%   BMI 28.62 kg/m     Wt Readings from Last 3 Encounters:  08/15/19 172 lb (78 kg)  08/07/19 169 lb (76.7 kg)  06/09/19 177 lb 6.4 oz (80.5 kg)     GEN: Patient is in no acute distress HEENT: Normal NECK: No JVD; No carotid bruits LYMPHATICS: No lymphadenopathy CARDIAC: Hear sounds regular, 2/6 systolic murmur at the apex. RESPIRATORY:  Clear to auscultation without rales, wheezing or rhonchi  ABDOMEN: Soft, non-tender, non-distended MUSCULOSKELETAL:  No edema; No deformity  SKIN: Warm and dry NEUROLOGIC:  Alert and oriented x 3 PSYCHIATRIC:  Normal affect   Signed, Jenean Lindau, MD  08/15/2019 9:52 AM    [facility]

## 2019-08-16 LAB — CBC WITH DIFFERENTIAL/PLATELET
Basophils Absolute: 0.1 10*3/uL (ref 0.0–0.2)
Basos: 1 %
EOS (ABSOLUTE): 0.6 10*3/uL — ABNORMAL HIGH (ref 0.0–0.4)
Eos: 8 %
Hematocrit: 43 % (ref 34.0–46.6)
Hemoglobin: 14.3 g/dL (ref 11.1–15.9)
Immature Grans (Abs): 0 10*3/uL (ref 0.0–0.1)
Immature Granulocytes: 0 %
Lymphocytes Absolute: 1.8 10*3/uL (ref 0.7–3.1)
Lymphs: 22 %
MCH: 31.4 pg (ref 26.6–33.0)
MCHC: 33.3 g/dL (ref 31.5–35.7)
MCV: 94 fL (ref 79–97)
Monocytes Absolute: 0.6 10*3/uL (ref 0.1–0.9)
Monocytes: 7 %
Neutrophils Absolute: 5 10*3/uL (ref 1.4–7.0)
Neutrophils: 62 %
Platelets: 300 10*3/uL (ref 150–450)
RBC: 4.56 x10E6/uL (ref 3.77–5.28)
RDW: 11.9 % (ref 11.7–15.4)
WBC: 8 10*3/uL (ref 3.4–10.8)

## 2019-08-16 LAB — COMPREHENSIVE METABOLIC PANEL
ALT: 11 IU/L (ref 0–32)
AST: 14 IU/L (ref 0–40)
Albumin/Globulin Ratio: 1.3 (ref 1.2–2.2)
Albumin: 3.5 g/dL — ABNORMAL LOW (ref 3.8–4.8)
Alkaline Phosphatase: 129 IU/L — ABNORMAL HIGH (ref 39–117)
BUN/Creatinine Ratio: 14 (ref 12–28)
BUN: 10 mg/dL (ref 8–27)
Bilirubin Total: <0.2 mg/dL (ref 0.0–1.2)
CO2: 23 mmol/L (ref 20–29)
Calcium: 9 mg/dL (ref 8.7–10.3)
Chloride: 104 mmol/L (ref 96–106)
Creatinine, Ser: 0.71 mg/dL (ref 0.57–1.00)
GFR calc Af Amer: 102 mL/min/{1.73_m2} (ref >59)
GFR calc non Af Amer: 88 mL/min/{1.73_m2} (ref >59)
Globulin, Total: 2.6 g/dL (ref 1.5–4.5)
Glucose: 168 mg/dL — ABNORMAL HIGH (ref 65–99)
Potassium: 4.3 mmol/L (ref 3.5–5.2)
Sodium: 142 mmol/L (ref 134–144)
Total Protein: 6.1 g/dL (ref 6.0–8.5)

## 2019-08-16 LAB — LIPID PANEL
Chol/HDL Ratio: 2.6 ratio (ref 0.0–4.4)
Cholesterol, Total: 118 mg/dL (ref 100–199)
HDL: 46 mg/dL (ref >39)
LDL Chol Calc (NIH): 57 mg/dL (ref 0–99)
Triglycerides: 72 mg/dL (ref 0–149)
VLDL Cholesterol Cal: 15 mg/dL (ref 5–40)

## 2019-08-16 LAB — CARDIOVASCULAR RISK ASSESSMENT

## 2019-08-16 LAB — HEMOGLOBIN A1C
Est. average glucose Bld gHb Est-mCnc: 137 mg/dL
Hgb A1c MFr Bld: 6.4 % — ABNORMAL HIGH (ref 4.8–5.6)

## 2019-08-18 ENCOUNTER — Encounter: Payer: Self-pay | Admitting: Family Medicine

## 2019-08-18 DIAGNOSIS — F17219 Nicotine dependence, cigarettes, with unspecified nicotine-induced disorders: Secondary | ICD-10-CM | POA: Insufficient documentation

## 2019-08-18 HISTORY — DX: Nicotine dependence, cigarettes, with unspecified nicotine-induced disorders: F17.219

## 2019-08-18 NOTE — Assessment & Plan Note (Addendum)
Recommend Tylenol.  Recommend rest, healthy diet, and exercise. Toradol shot given.

## 2019-08-18 NOTE — Assessment & Plan Note (Signed)
  No changes to medicines.  Continue to work on eating a healthy diet and exercise.  Labs to be drawn later this month.

## 2019-08-18 NOTE — Assessment & Plan Note (Signed)
Continue current dose of Synthroid.  Well-controlled.

## 2019-08-18 NOTE — Assessment & Plan Note (Signed)
Strongly recommend tobacco cessation.  Order Chantix.

## 2019-08-18 NOTE — Assessment & Plan Note (Signed)
  START CHANTIX 0.5 MG ONCE DAILY IN AM WITH FOOD X 3 DAYS, THEN INCREASE TO TWICE A DAY. AFTER ONE WEEK INCREASE TO 1 MG ONE TWICE A DAY.  Education given.

## 2019-08-18 NOTE — Assessment & Plan Note (Signed)
Change Symbicort to breztri 2 puffs twice a day.  Patient had requested Symbicort samples but we do not have any.  The fact that she has run out of Symbicort and due to cost is not able to refill it may be contributing to her persistent bronchial symptoms.

## 2019-08-18 NOTE — Assessment & Plan Note (Signed)
Diabetic control is sporadic.  Patient will at times go off of her medications and her diet resulting in very high A1c's and sugars and other times she does very well. At this point no changes to her medications.   Continue to work on eating a healthy diet and exercise.  Labs ordered for later this month.

## 2019-08-18 NOTE — Assessment & Plan Note (Addendum)
Patient requested low-dose prednisone for flareups.  I agreed to give for her a very limited number and strongly encouraged patient to return to her rheumatologist.

## 2019-08-20 LAB — TSH: TSH: 1.57 u[IU]/mL (ref 0.450–4.500)

## 2019-08-20 LAB — SPECIMEN STATUS REPORT

## 2019-08-21 ENCOUNTER — Other Ambulatory Visit: Payer: Self-pay | Admitting: Physician Assistant

## 2019-09-03 ENCOUNTER — Telehealth: Payer: Self-pay | Admitting: Cardiology

## 2019-09-03 NOTE — Telephone Encounter (Signed)
Patient called and wanted to make sure it was safe for her to get the COVID vaccine tomorrow. Please advise

## 2019-09-03 NOTE — Telephone Encounter (Signed)
Brenda Cochran called and was inquiring that it would be safe for her to receive her COVID vaccination from a Cardiology standpoint. I told her it would be safe as far as cardiology but recommended her contacting her PCP which she had already done.

## 2019-09-05 ENCOUNTER — Ambulatory Visit: Payer: Medicare Other | Attending: Internal Medicine

## 2019-09-05 DIAGNOSIS — Z23 Encounter for immunization: Secondary | ICD-10-CM

## 2019-09-05 NOTE — Progress Notes (Signed)
   Covid-19 Vaccination Clinic  Name:  Brenda Cochran    MRN: [id number] DOB: 03-25-1951  09/05/2019  [name] was observed post Covid-19 immunization for 30 minutes based on pre-vaccination screening without incident. She was provided with Vaccine Information Sheet and instruction to access the V-Safe system.   [name] was instructed to call 911 with any severe reactions post vaccine: Marland Kitchen Difficulty breathing  . Swelling of face and throat  . A fast heartbeat  . A bad rash all over body  . Dizziness and weakness   Immunizations Administered    Name Date Dose VIS Date Route   Pfizer COVID-19 Vaccine 09/05/2019 10:03 AM 0.3 mL 05/16/2019 Intramuscular   Manufacturer: [employer], [employer]   Lot: [id number]   [place]: [id number]

## 2019-09-10 DIAGNOSIS — R002 Palpitations: Secondary | ICD-10-CM | POA: Diagnosis not present

## 2019-09-11 ENCOUNTER — Other Ambulatory Visit: Payer: Self-pay

## 2019-09-11 ENCOUNTER — Encounter: Payer: Self-pay | Admitting: Critical Care Medicine

## 2019-09-11 ENCOUNTER — Ambulatory Visit (INDEPENDENT_AMBULATORY_CARE_PROVIDER_SITE_OTHER): Payer: Medicare Other | Admitting: Critical Care Medicine

## 2019-09-11 VITALS — BP 142/76 | HR 85 | Temp 97.6°F | Ht 63.0 in | Wt 174.2 lb

## 2019-09-11 DIAGNOSIS — R609 Edema, unspecified: Secondary | ICD-10-CM

## 2019-09-11 DIAGNOSIS — J441 Chronic obstructive pulmonary disease with (acute) exacerbation: Secondary | ICD-10-CM | POA: Diagnosis not present

## 2019-09-11 DIAGNOSIS — R0609 Other forms of dyspnea: Secondary | ICD-10-CM

## 2019-09-11 DIAGNOSIS — I5030 Unspecified diastolic (congestive) heart failure: Secondary | ICD-10-CM

## 2019-09-11 DIAGNOSIS — F17219 Nicotine dependence, cigarettes, with unspecified nicotine-induced disorders: Secondary | ICD-10-CM

## 2019-09-11 DIAGNOSIS — R0600 Dyspnea, unspecified: Secondary | ICD-10-CM | POA: Diagnosis not present

## 2019-09-11 DIAGNOSIS — J45901 Unspecified asthma with (acute) exacerbation: Secondary | ICD-10-CM | POA: Diagnosis not present

## 2019-09-11 MED ORDER — FUROSEMIDE 20 MG PO TABS: 20.0000 mg | ORAL_TABLET | Freq: Every day | ORAL | 0 refills | Status: DC

## 2019-09-11 NOTE — Patient Instructions (Addendum)
Thank you for visiting Dr. Carlis Abbott at [facility]. We recommend the following: Orders Placed This Encounter  Procedures  . Basic Metabolic Panel (BMET)  . ECHOCARDIOGRAM COMPLETE  . Pulmonary Function Test   Orders Placed This Encounter  Procedures  . Basic Metabolic Panel (BMET)    Standing Status:   Future    Standing Expiration Date:   09/10/2020  . ECHOCARDIOGRAM COMPLETE    Standing Status:   Future    Standing Expiration Date:   12/10/2020    Order Specific Question:   Where should this test be performed    Answer:   [facility])    Order Specific Question:   Does the patient weigh less than or greater than 250 lbs?    Answer:   Patient weighs less than 250 lbs    Order Specific Question:   Perflutren DEFINITY (image enhancing agent) should be administered unless hypersensitivity or allergy exist    Answer:   Administer Perflutren    Order Specific Question:   Reason for exam-Echo    Answer:   Other-Full Diagnosis List    Order Specific Question:   Full ICD-10/Reason for Exam    Answer:   DOE (dyspnea on exertion) [242095]  . Pulmonary Function Test    Standing Status:   Future    Standing Expiration Date:   09/10/2020    Order Specific Question:   Where should this test be performed?    Answer:   [facility]    Order Specific Question:   Full PFT: includes the following: basic spirometry, spirometry pre & post bronchodilator, diffusion capacity (DLCO), lung volumes    Answer:   Full PFT    Meds ordered this encounter  Medications  . furosemide (LASIX) 20 MG tablet    Sig: Take 1 tablet (20 mg total) by mouth daily.    Dispense:  30 tablet    Refill:  0    Use albuterol about 5 minutes before Breztri two times daily.   Return in about 2 weeks (around 09/25/2019). after PFTs    Please do your part to reduce the spread of COVID-19.

## 2019-09-11 NOTE — Progress Notes (Signed)
Synopsis: Referred in April 2021 for COPD, asthma by Rochel Brome, MD.  Subjective:   PATIENT ID: Brenda Cochran GENDER: female DOB: January 19, 1951, MRN: [id number]  Chief Complaint  Patient presents with  . Consult    Brenda Cochran is a 69 year old woman who presents for evaluation of chronic cough and dyspnea.  She was diagnosed with COPD during 11-day hospitalization in late 2010.  She subsequently followed up with Dr. Carren Rang who told her it was more asthma than COPD.  She was started on inhalers none.  She last saw Dr. Carren Rang several years ago.  She is unsure if she had PFTs in the past.  She had relatively stable symptoms until about 3 months ago when she had a persistent cough.  Her cough feels productive, but she is unable to expectorate sputum.  She previously had discolored sputum, which is cleared.  She coughs periodically throughout the day, but it is worse later in the day and overnight.  She has been coughing so much that she has rib soreness.  She is short of breath all the time even when sitting still, but worse with activity.  She has occasional wheezing.  Nothing seems to make her symptoms better or worse.  She has been tested for Covid several times, all negative.  She was started on prednisone by her primary care provider.  She was feeling worse and went to urgent care in February when she was started on azithromycin, Tessalon, albuterol.  She had mild benefit on azithromycin, but had recurrence of symptoms azithromycin stopped.  Her Symbicort was recently escalated to [facility].  When she is using her inhaler she feels like the medicine stops in her throat and it does not go all the way to her lungs.  She is only used her albuterol about 3 times in the past few months but did not notice a significant benefit. She has a history of chronic sinus allergic symptoms with chronic right-sided sinusitis due to a deviated septum.  She takes Flonase daily.  She has not previously tried an  antihistamine.  She continues to smoke 1 pack/day.  She is smoked about 25 years, her heaviest 4 packs/day in the 25s.  She previously quit for 7 years in early 2000's.  She is planning on starting Chantix later this month to help with smoking cessation.  She has a family history notable for lung cancer in an uncle and her brother who has COPD and asthma.   She has a history significant for coronary artery disease and HFpEF.  She follows with Dr. Geraldo Pitter in cardiology, 08/15/2019 note reviewed.  She had a stent placed in 2020.  Recently she has noticed weight gain, abdominal distention, mild leg edema, orthopnea requiring her to sleep on 3 pillows.  She has a 1-2 times per night for the past year.  She is not on a diuretic regimen.  She has occasional chest pain for which she takes nitroglycerin, which is her recent baseline since her heart attack last year.     Past Medical History:  Diagnosis Date  . Asthma   . Atherosclerotic heart disease of native coronary artery without angina pectoris   . Chronic maxillary sinusitis   . COPD (chronic obstructive pulmonary disease) ([place])   . Depression   . Fibromyalgia   . GERD (gastroesophageal reflux disease)   . Headache   . Hyperlipidemia   . Hypertensive heart disease without heart failure   . Hypothyroidism (acquired)   . Major depressive  disorder, single episode, mild ([place])   . Menopausal and female climacteric states   . Nicotine dependence, cigarettes, with unspecified nicotine-induced disorders   . Osteoarthritis   . Overweight   . RA (rheumatoid arthritis) ([place])    [name]  . Skin cancer    face  . Sleep apnea    cpap  . Type 2 diabetes mellitus ([place])   . Type 2 diabetes mellitus with diabetic nephropathy ([place])   . Vitamin D deficiency, unspecified      Family History  Problem Relation Age of Onset  . Breast cancer Sister   . Lymphoma Sister   . Multiple sclerosis Brother   . Muscular dystrophy Brother   . CAD Other    . Diabetes Other   . Hypertension Other   . Seizures Other   . Lung disease Mother        never smoker  . Lung disease Maternal Uncle        lung cancer  . Lung disease Maternal Uncle   . Lung cancer Father   . Bone cancer Father   . COPD Brother   . Colon cancer Neg Hx   . Colon polyps Neg Hx   . Esophageal cancer Neg Hx   . Stomach cancer Neg Hx   . Rectal cancer Neg Hx      Past Surgical History:  Procedure Laterality Date  . ABDOMINAL HYSTERECTOMY  1984  . BREAST LUMPECTOMY  1976   left, benign  . CHOLECYSTECTOMY  09/05/10  . CORONARY STENT INTERVENTION N/A 07/12/2018   Procedure: CORONARY STENT INTERVENTION;  Surgeon: Jettie Booze, MD;  Location: [place] [facility];  Service: Cardiovascular;  Laterality: N/A;  . CORONARY THROMBECTOMY N/A 07/12/2018   Procedure: Coronary Thrombectomy;  Surgeon: Jettie Booze, MD;  Location: [place];  Service: Cardiovascular;  Laterality: N/A;  . INTRAVASCULAR PRESSURE WIRE/FFR STUDY N/A 01/21/2019   Procedure: INTRAVASCULAR PRESSURE WIRE/FFR STUDY;  Surgeon: Jettie Booze, MD;  Location: [place] [facility];  Service: Cardiovascular;  Laterality: N/A;  . LEFT HEART CATH AND CORONARY ANGIOGRAPHY N/A 07/12/2018   Procedure: LEFT HEART CATH AND CORONARY ANGIOGRAPHY;  Surgeon: Jettie Booze, MD;  Location: [place] [facility];  Service: Cardiovascular;  Laterality: N/A;  . LEFT HEART CATH AND CORONARY ANGIOGRAPHY N/A 01/21/2019   Procedure: LEFT HEART CATH AND CORONARY ANGIOGRAPHY;  Surgeon: Jettie Booze, MD;  Location: [facility] [facility];  Service: Cardiovascular;  Laterality: N/A;    Social History   Socioeconomic History  . Marital status: Married    Spouse name: Not on file  . Number of children: 6  . Years of education: Not on file  . Highest education level: Not on file  Occupational History  . Occupation: disabled    Comment: jockey  Tobacco Use  . Smoking status: Current Every Day Smoker      Packs/day: 4.00    Years: 30.00    Pack years: 120.00    Types: Cigarettes  . Smokeless tobacco: Current User  . Tobacco comment: now smoking 1 pack a day  Substance and Sexual Activity  . Alcohol use: No  . Drug use: No  . Sexual activity: Not on file  Other Topics Concern  . Not on file  Social History Narrative   Lives with Brenda Cochran husband.   Disabled   Social Determinants of Health   Financial Resource Strain:   . Difficulty of Paying Living Expenses:  Food Insecurity:   . Worried About Charity fundraiser in the Last Year:   . Arboriculturist in the Last Year:   Transportation Needs:   . Film/video editor (Medical):   Marland Kitchen Lack of Transportation (Non-Medical):   Physical Activity:   . Days of Exercise per Week:   . Minutes of Exercise per Session:   Stress:   . Feeling of Stress :   Social Connections:   . Frequency of Communication with Friends and Family:   . Frequency of Social Gatherings with Friends and Family:   . Attends Religious Services:   . Active Member of Clubs or Organizations:   . Attends Archivist Meetings:   Marland Kitchen Marital Status:   Intimate Partner Violence:   . Fear of Current or Ex-Partner:   . Emotionally Abused:   Marland Kitchen Physically Abused:   . Sexually Abused:      Allergies  Allergen Reactions  . Shellfish Allergy Anaphylaxis  . Contrast Media [Iodinated Diagnostic Agents] Rash  . Metformin And Related Nausea And Vomiting  . Amitriptyline Hcl Other (See Comments)    Somnolence  . Cymbalta [Duloxetine Hcl] Nausea And Vomiting  . Ultram [Tramadol] Nausea And Vomiting  . Oxycontin [Oxycodone Hcl] Palpitations     Immunization History  Administered Date(s) Administered  . Influenza, High Dose Seasonal PF 03/01/2018  . Influenza,inj,Quad PF,6-35 Mos 05/06/2019  . Influenza-Unspecified 02/03/2013, 03/05/2014  . PFIZER SARS-COV-2 Vaccination 09/05/2019  . Pneumococcal Conjugate-13 04/13/2017  . Pneumococcal  Polysaccharide-23 03/01/2018  . Pneumococcal-Unspecified 06/05/2010, 09/03/2016    Outpatient Medications Prior to Visit  Medication Sig Dispense Refill  . albuterol (VENTOLIN HFA) 108 (90 Base) MCG/ACT inhaler Inhale 1-2 puffs into the lungs every 6 (six) hours as needed for wheezing or shortness of breath.    Marland Kitchen aspirin EC 81 MG tablet Take 81 mg by mouth daily.    Marland Kitchen BRILINTA 90 MG TABS tablet Take 1 tablet by mouth twice daily 180 tablet 3  . Budeson-Glycopyrrol-Formoterol (BREZTRI AEROSPHERE) 160-9-4.8 MCG/ACT AERO Inhale 2 puffs into the lungs 2 (two) times daily. 10.7 g 3  . leflunomide (ARAVA) 20 MG tablet Take 20 mg by mouth daily.    . nitroGLYCERIN (NITROSTAT) 0.4 MG SL tablet Place 1 tablet (0.4 mg total) under the tongue every 5 (five) minutes x 3 doses as needed for chest pain. 25 tablet 12  . predniSONE (DELTASONE) 5 MG tablet Take 1 tablet (5 mg total) by mouth daily as needed for up to 10 doses. 10 tablet 0  . ranolazine (RANEXA) 1000 MG SR tablet Take 0.5 tablets of 500 mg twice daily for 2 weeks then increase to 1 tablet twice daily thereafter. 180 tablet 3  . rosuvastatin (CRESTOR) 10 MG tablet TAKE 1 TABLET AT NIGHT FOR HIGH CHOLESTEROL 90 tablet 1  . varenicline (CHANTIX) 0.5 MG tablet Take 1 tablet (0.5 mg total) by mouth 2 (two) times daily. 60 tablet 0  . budesonide-formoterol (SYMBICORT) 160-4.5 MCG/ACT inhaler Inhale 1 puff into the lungs 2 (two) times daily as needed (shortness of breath).     . metoprolol tartrate (LOPRESSOR) 25 MG tablet TAKE 1/2 TABLET BY MOUTH TWICE A DAY 90 tablet 1  . ondansetron (ZOFRAN) 4 MG tablet     . OZEMPIC, 1 MG/DOSE, 2 MG/1.5ML SOPN Inject 1 mg into the skin once a week.     No facility-administered medications prior to visit.    Review of Systems  Constitutional: Negative for chills, fever  and weight loss.       6# weight gain in past week  Respiratory: Positive for cough, sputum production, shortness of breath and wheezing.     Cardiovascular: Positive for orthopnea and leg swelling. Negative for chest pain.  Gastrointestinal: Negative for blood in stool, heartburn, melena, nausea and vomiting.  Genitourinary: Negative for hematuria.       Nocturia  Skin: Negative for rash.  Neurological: Negative for focal weakness.  Endo/Heme/Allergies: Positive for environmental allergies.     Objective:   Vitals:   09/11/19 0957  BP: (!) 142/76  Pulse: 85  Temp: 97.6 F (36.4 C)  TempSrc: Temporal  SpO2: 100%  Weight: 174 lb 3.2 oz (79 kg)  Height: 5\' 3"  (1.6 m)   100% on   RA BMI Readings from Last 3 Encounters:  09/11/19 30.86 kg/m  08/15/19 28.62 kg/m  08/07/19 28.12 kg/m   Wt Readings from Last 3 Encounters:  09/11/19 174 lb 3.2 oz (79 kg)  08/15/19 172 lb (78 kg)  08/07/19 169 lb (76.7 kg)    Physical Exam Vitals reviewed.  Constitutional:      Appearance: Normal appearance. She is obese. She is not ill-appearing.  HENT:     Head: Normocephalic and atraumatic.  Eyes:     General: No scleral icterus. Cardiovascular:     Rate and Rhythm: Normal rate and regular rhythm.     Comments: No JVD Pulmonary:     Comments: Frequent coughing, prolonged exhalation, no significant wheezing.  Scattered rhonchi. Abdominal:     General: There is no distension.     Tenderness: There is no abdominal tenderness.  Musculoskeletal:        General: No swelling or deformity.     Cervical back: Neck supple.  Lymphadenopathy:     Cervical: No cervical adenopathy.  Skin:    General: Skin is warm and dry.     Findings: No rash.  Neurological:     General: No focal deficit present.     Mental Status: She is alert.     Motor: No weakness.     Coordination: Coordination normal.  Psychiatric:        Mood and Affect: Mood normal.        Behavior: Behavior normal.      CBC    Component Value Date/Time   WBC 8.0 08/15/2019 0745   WBC 15.5 (H) 07/13/2018 0720   RBC 4.56 08/15/2019 0745   RBC 4.42  07/13/2018 0720   HGB 14.3 08/15/2019 0745   HCT 43.0 08/15/2019 0745   PLT 300 08/15/2019 0745   MCV 94 08/15/2019 0745   MCH 31.4 08/15/2019 0745   MCH 29.6 07/13/2018 0720   MCHC 33.3 08/15/2019 0745   MCHC 33.2 07/13/2018 0720   RDW 11.9 08/15/2019 0745   LYMPHSABS 1.8 08/15/2019 0745   MONOABS 0.2 09/14/2016 1025   EOSABS 0.6 (H) 08/15/2019 0745   BASOSABS 0.1 08/15/2019 0745    CHEMISTRY No results for input(s): NA, K, CL, CO2, GLUCOSE, BUN, CREATININE, CALCIUM, MG, PHOS in the last 168 hours. CrCl cannot be calculated (Patient's most recent lab result is older than the maximum 21 days allowed.).   Chest Imaging- films reviewed: CXR, 1 view 07/12/2018-mildly increased interstitial markings bilaterally.  Pulmonary Functions Testing Results: No flowsheet data found.   Spirometry 01/13/2014: FVC 2.4 (77%) FEV1 1.9 (76%) Ratio 77%   Echocardiogram 07/12/2018: EF 55 to 123456, diastolic dysfunction is present.  Normal LA, RV, RA.  Normal  valves.    Heart Catheterization 01/21/2019:   Ost LAD lesion is 40% stenosed.  Previously placed Mid LAD drug eluting stent is widely patentwidely patent.  Ost 1st Mrg lesion is 60% stenosed.  Dist Cx lesion is 25% stenosed.  Prox RCA lesion is 25% stenosed.  The left ventricular systolic function is normal.  LV end diastolic pressure is normal.  The left ventricular ejection fraction is 55-65% by visual estimate.  There is no aortic valve stenosis.   LAD disease moderate at ostium but not significant by DFR.  Some moderate small branch vessel disease present.  Continue medical therapy and aggressive secondary prevention.      Assessment & Plan:     ICD-10-CM   1. Asthma, chronic obstructive, with acute exacerbation ([place])  J44.1 Pulmonary Function Test   J45.901 ECHOCARDIOGRAM COMPLETE  2. Cigarette nicotine dependence with nicotine-induced disorder  F17.219 Pulmonary Function Test    ECHOCARDIOGRAM COMPLETE  3. DOE (dyspnea  on exertion)  R06.00 Pulmonary Function Test    ECHOCARDIOGRAM COMPLETE    furosemide (LASIX) 20 MG tablet    Basic Metabolic Panel (BMET)  4. Edema, unspecified type  [id number] Basic Metabolic Panel (BMET)  5. Heart failure with preserved ejection fraction, unspecified HF chronicity ([facility])  I50.30      Chronic dyspnea on exertion, likely COPD.  Concern for worsening HFpEF. Worsened baseline after a COPD exacerbation this fall remains possible. -Continue Breztri twice daily.  Reviewed inhaler technique.  Recommend using albuterol 5 minutes prior to help with breath-hold. -Continue albuterol every 4 hours as needed -PFT -Trial of diuretics, Lasix 20 mg daily.  Needs BMP at follow-up. -Echocardiogram  Chronic history of asthma, allergic rhinosinusitis -Start oral antihistamine-either Zyrtec or Claritin once daily. -Continue Flonase nasal spray  Tobacco abuse -Agree with chantix for smoking cessation and encouraged her efforts. -Need to discuss lung cancer screening at follow-up visit.  RTC in ~2 weeks after PFTs.    Current Outpatient Medications:  .  albuterol (VENTOLIN HFA) 108 (90 Base) MCG/ACT inhaler, Inhale 1-2 puffs into the lungs every 6 (six) hours as needed for wheezing or shortness of breath., Disp: , Rfl:  .  aspirin EC 81 MG tablet, Take 81 mg by mouth daily., Disp: , Rfl:  .  BRILINTA 90 MG TABS tablet, Take 1 tablet by mouth twice daily, Disp: 180 tablet, Rfl: 3 .  Budeson-Glycopyrrol-Formoterol (BREZTRI AEROSPHERE) 160-9-4.8 MCG/ACT AERO, Inhale 2 puffs into the lungs 2 (two) times daily., Disp: 10.7 g, Rfl: 3 .  leflunomide (ARAVA) 20 MG tablet, Take 20 mg by mouth daily., Disp: , Rfl:  .  nitroGLYCERIN (NITROSTAT) 0.4 MG SL tablet, Place 1 tablet (0.4 mg total) under the tongue every 5 (five) minutes x 3 doses as needed for chest pain., Disp: 25 tablet, Rfl: 12 .  predniSONE (DELTASONE) 5 MG tablet, Take 1 tablet (5 mg total) by mouth daily as needed for up to 10 doses.,  Disp: 10 tablet, Rfl: 0 .  ranolazine (RANEXA) 1000 MG SR tablet, Take 0.5 tablets of 500 mg twice daily for 2 weeks then increase to 1 tablet twice daily thereafter., Disp: 180 tablet, Rfl: 3 .  rosuvastatin (CRESTOR) 10 MG tablet, TAKE 1 TABLET AT NIGHT FOR HIGH CHOLESTEROL, Disp: 90 tablet, Rfl: 1 .  varenicline (CHANTIX) 0.5 MG tablet, Take 1 tablet (0.5 mg total) by mouth 2 (two) times daily., Disp: 60 tablet, Rfl: 0 .  furosemide (LASIX) 20 MG tablet, Take 1 tablet (20 mg total) by  mouth daily., Disp: 30 tablet, Rfl: 0    Julian Hy, DO [facility] 09/11/2019 11:08 AM

## 2019-09-18 ENCOUNTER — Other Ambulatory Visit: Payer: Self-pay

## 2019-09-18 ENCOUNTER — Ambulatory Visit (HOSPITAL_COMMUNITY): Payer: Medicare Other | Attending: Internal Medicine

## 2019-09-18 DIAGNOSIS — J441 Chronic obstructive pulmonary disease with (acute) exacerbation: Secondary | ICD-10-CM | POA: Diagnosis not present

## 2019-09-18 DIAGNOSIS — R0609 Other forms of dyspnea: Secondary | ICD-10-CM

## 2019-09-18 DIAGNOSIS — J45901 Unspecified asthma with (acute) exacerbation: Secondary | ICD-10-CM | POA: Diagnosis not present

## 2019-09-18 DIAGNOSIS — F17219 Nicotine dependence, cigarettes, with unspecified nicotine-induced disorders: Secondary | ICD-10-CM | POA: Diagnosis not present

## 2019-09-18 DIAGNOSIS — R0600 Dyspnea, unspecified: Secondary | ICD-10-CM

## 2019-09-22 ENCOUNTER — Other Ambulatory Visit (HOSPITAL_COMMUNITY)
Admission: RE | Admit: 2019-09-22 | Discharge: 2019-09-22 | Disposition: A | Discharge status: Home / Self Care | Payer: Medicare Other | Source: Ambulatory Visit | Attending: Critical Care Medicine | Admitting: Critical Care Medicine

## 2019-09-22 DIAGNOSIS — Z01812 Encounter for preprocedural laboratory examination: Secondary | ICD-10-CM | POA: Insufficient documentation

## 2019-09-22 DIAGNOSIS — Z20822 Contact with and (suspected) exposure to covid-19: Secondary | ICD-10-CM | POA: Insufficient documentation

## 2019-09-22 LAB — SARS CORONAVIRUS 2 (TAT 6-24 HRS): SARS Coronavirus 2: NEGATIVE

## 2019-09-25 ENCOUNTER — Other Ambulatory Visit: Payer: Self-pay

## 2019-09-25 ENCOUNTER — Ambulatory Visit (INDEPENDENT_AMBULATORY_CARE_PROVIDER_SITE_OTHER): Payer: Medicare Other | Admitting: Critical Care Medicine

## 2019-09-25 DIAGNOSIS — F17219 Nicotine dependence, cigarettes, with unspecified nicotine-induced disorders: Secondary | ICD-10-CM

## 2019-09-25 DIAGNOSIS — J441 Chronic obstructive pulmonary disease with (acute) exacerbation: Secondary | ICD-10-CM

## 2019-09-25 DIAGNOSIS — R0609 Other forms of dyspnea: Secondary | ICD-10-CM

## 2019-09-25 DIAGNOSIS — J45901 Unspecified asthma with (acute) exacerbation: Secondary | ICD-10-CM

## 2019-09-25 DIAGNOSIS — R0600 Dyspnea, unspecified: Secondary | ICD-10-CM

## 2019-09-25 LAB — PULMONARY FUNCTION TEST
DL/VA % pred: 114 %
DL/VA: 4.74 ml/min/mmHg/L
DLCO cor % pred: 76 %
DLCO cor: 15.64 ml/min/mmHg
DLCO unc % pred: 78 %
DLCO unc: 16.06 ml/min/mmHg
FEF 25-75 Post: 1.56 L/sec
FEF 25-75 Pre: 1.4 L/sec
FEF2575-%Change-Post: 10 %
FEF2575-%Pred-Post: 74 %
FEF2575-%Pred-Pre: 66 %
FEV1-%Change-Post: 2 %
FEV1-%Pred-Post: 72 %
FEV1-%Pred-Pre: 70 %
FEV1-Post: 1.77 L
FEV1-Pre: 1.73 L
FEV1FVC-%Change-Post: 1 %
FEV1FVC-%Pred-Pre: 100 %
FEV6-%Change-Post: 0 %
FEV6-%Pred-Post: 73 %
FEV6-%Pred-Pre: 72 %
FEV6-Post: 2.27 L
FEV6-Pre: 2.25 L
FEV6FVC-%Pred-Post: 104 %
FEV6FVC-%Pred-Pre: 104 %
FVC-%Change-Post: 0 %
FVC-%Pred-Post: 70 %
FVC-%Pred-Pre: 69 %
FVC-Post: 2.27 L
FVC-Pre: 2.25 L
Post FEV1/FVC ratio: 78 %
Post FEV6/FVC ratio: 100 %
Pre FEV1/FVC ratio: 77 %
Pre FEV6/FVC Ratio: 100 %
RV % pred: 102 %
RV: 2.23 L
TLC % pred: 80 %
TLC: 4.16 L

## 2019-09-25 NOTE — Progress Notes (Signed)
PFT done today. 

## 2019-09-26 NOTE — Progress Notes (Signed)
Please let Brenda Cochran know that her echo looks about the same as last year, so it is likely that her COPD is getting worse causing her SOB. Thanks!  LPC

## 2019-09-29 ENCOUNTER — Ambulatory Visit: Payer: Medicare Other | Attending: Internal Medicine

## 2019-09-29 DIAGNOSIS — Z23 Encounter for immunization: Secondary | ICD-10-CM

## 2019-09-29 NOTE — Progress Notes (Signed)
   Covid-19 Vaccination Clinic  Name:  Brenda Cochran    MRN: [id number] DOB: 05/12/1951  09/29/2019  [name] was observed post Covid-19 immunization for 30 minutes based on pre-vaccination screening without incident. She was provided with Vaccine Information Sheet and instruction to access the V-Safe system.   [name] was instructed to call 911 with any severe reactions post vaccine: Marland Kitchen Difficulty breathing  . Swelling of face and throat  . A fast heartbeat  . A bad rash all over body  . Dizziness and weakness   Immunizations Administered    Name Date Dose VIS Date Route   Pfizer COVID-19 Vaccine 09/29/2019 12:15 PM 0.3 mL 07/30/2018 Intramuscular   Manufacturer: [place]   Lot: [id number]   [place]: [id number]

## 2019-10-07 ENCOUNTER — Encounter: Payer: Self-pay | Admitting: Critical Care Medicine

## 2019-10-07 ENCOUNTER — Ambulatory Visit (INDEPENDENT_AMBULATORY_CARE_PROVIDER_SITE_OTHER): Payer: Medicare Other | Admitting: Critical Care Medicine

## 2019-10-07 ENCOUNTER — Other Ambulatory Visit (INDEPENDENT_AMBULATORY_CARE_PROVIDER_SITE_OTHER): Payer: Medicare Other

## 2019-10-07 ENCOUNTER — Other Ambulatory Visit: Payer: Self-pay

## 2019-10-07 VITALS — BP 130/86 | HR 93 | Temp 98.1°F | Ht 65.0 in | Wt 175.2 lb

## 2019-10-07 DIAGNOSIS — R0600 Dyspnea, unspecified: Secondary | ICD-10-CM | POA: Diagnosis not present

## 2019-10-07 DIAGNOSIS — I5030 Unspecified diastolic (congestive) heart failure: Secondary | ICD-10-CM | POA: Diagnosis not present

## 2019-10-07 DIAGNOSIS — J441 Chronic obstructive pulmonary disease with (acute) exacerbation: Secondary | ICD-10-CM

## 2019-10-07 DIAGNOSIS — R0609 Other forms of dyspnea: Secondary | ICD-10-CM

## 2019-10-07 DIAGNOSIS — F17219 Nicotine dependence, cigarettes, with unspecified nicotine-induced disorders: Secondary | ICD-10-CM | POA: Diagnosis not present

## 2019-10-07 DIAGNOSIS — J4550 Severe persistent asthma, uncomplicated: Secondary | ICD-10-CM

## 2019-10-07 DIAGNOSIS — J45901 Unspecified asthma with (acute) exacerbation: Secondary | ICD-10-CM | POA: Diagnosis not present

## 2019-10-07 LAB — BASIC METABOLIC PANEL
BUN: 7 mg/dL (ref 6–23)
CO2: 30 mEq/L (ref 19–32)
Calcium: 9.6 mg/dL (ref 8.4–10.5)
Chloride: 102 mEq/L (ref 96–112)
Creatinine, Ser: 0.7 mg/dL (ref 0.40–1.20)
GFR: 83.21 mL/min (ref >60.00)
Glucose, Bld: 138 mg/dL — ABNORMAL HIGH (ref 70–99)
Potassium: 3.9 mEq/L (ref 3.5–5.1)
Sodium: 138 mEq/L (ref 135–145)

## 2019-10-07 MED ORDER — MONTELUKAST SODIUM 10 MG PO TABS: 10.0000 mg | ORAL_TABLET | Freq: Every day | ORAL | 11 refills | Status: DC

## 2019-10-07 MED ORDER — POTASSIUM CHLORIDE ER 10 MEQ PO TBCR: 10.0000 meq | EXTENDED_RELEASE_TABLET | Freq: Every day | ORAL | 0 refills | Status: DC

## 2019-10-07 MED ORDER — FUROSEMIDE 20 MG PO TABS: 20.0000 mg | ORAL_TABLET | Freq: Every day | ORAL | 0 refills | Status: DC

## 2019-10-07 NOTE — Patient Instructions (Addendum)
Thank you for visiting Dr. Carlis Abbott at [facility]. We recommend the following: Orders Placed This Encounter  Procedures  . Aspergillus IgE Panel  . IgE  . CBC with Differential  . Basic Metabolic Panel (BMET)  . Ambulatory Referral for Lung Cancer Scre   Orders Placed This Encounter  Procedures  . Aspergillus IgE Panel  . IgE    Standing Status:   Future    Standing Expiration Date:   10/06/2020  . CBC with Differential  . Basic Metabolic Panel (BMET)    Standing Status:   Future    Standing Expiration Date:   10/06/2020  . Ambulatory Referral for Lung Cancer Scre    Referral Priority:   Routine    Referral Type:   Consultation    Referral Reason:   Specialty Services Required    Number of Visits Requested:   1    Meds ordered this encounter  Medications  . montelukast (SINGULAIR) 10 MG tablet    Sig: Take 1 tablet (10 mg total) by mouth at bedtime.    Dispense:  30 tablet    Refill:  11  . furosemide (LASIX) 20 MG tablet    Sig: Take 1 tablet (20 mg total) by mouth daily.    Dispense:  30 tablet    Refill:  0  . potassium chloride (KLOR-CON) 10 MEQ tablet    Sig: Take 1 tablet (10 mEq total) by mouth daily.    Dispense:  30 tablet    Refill:  0     Switch claritin to zyrtec- once daily. Take this with your Singulair once daily.  Return in about 4 weeks (around 11/04/2019).     Please do your part to reduce the spread of COVID-19.

## 2019-10-07 NOTE — Progress Notes (Signed)
Synopsis: Referred in April 2021 for COPD, asthma by Rochel Brome, MD.  Subjective:   PATIENT ID: Brenda Cochran GENDER: female DOB: 06/30/1950, MRN: [id number]  Chief Complaint  Patient presents with  . Follow-up    pt states she is her for follow up to go over pft results. Pt is using rescue inhaler only when needed.    Brenda Cochran is a 69 year old woman who presents for follow-up of chronic dyspnea on exertion.  She has been on Breztri twice daily for over a month, but continues to use her albuterol frequently-sometimes multiple times per day.  There are some days she does not need it though.  She feels that it helps her for a few minutes before wearing off.  She has noticed recently that she has been cleaning more due to finding mold in her house this winter.  She thinks this may have something to do with her breathing being worse.  She is wheezing a few days a week, coughing frequently, which causes muscle spasms in her chest.  She has had thick white sputum.  She has nighttime symptoms improved by sleeping on an extra pillow.  She has started taking Claritin daily for allergies and continues to use Flonase nasal spray.  She took Lasix for about a week but stopped due to severe leg cramps.  Not noticed that her breathing was improved when taking this.  She relates that in the past when she was on prednisone for her RA her breathing was improved. No weight loss with lasix. No leg edema. ACT 13  She is continuing to cut down on smoking-she is down to 11 to 12 cigarettes a day from 1 pack/day.  She is interested in lung cancer screening.    OV 09/11/19: Brenda Cochran is a 69 year old woman who presents for evaluation of chronic cough and dyspnea.  She was diagnosed with COPD during 11-day hospitalization in late 2010.  She subsequently followed up with Dr. Carren Rang who told her it was more asthma than COPD.  She was started on inhalers none.  She last saw Dr. Carren Rang several years ago.  She is unsure if  she had PFTs in the past.  She had relatively stable symptoms until about 3 months ago when she had a persistent cough.  Her cough feels productive, but she is unable to expectorate sputum.  She previously had discolored sputum, which is cleared.  She coughs periodically throughout the day, but it is worse later in the day and overnight.  She has been coughing so much that she has rib soreness.  She is short of breath all the time even when sitting still, but worse with activity.  She has occasional wheezing.  Nothing seems to make her symptoms better or worse.  She has been tested for Covid several times, all negative.  She was started on prednisone by her primary care provider.  She was feeling worse and went to urgent care in February when she was started on azithromycin, Tessalon, albuterol.  She had mild benefit on azithromycin, but had recurrence of symptoms azithromycin stopped.  Her Symbicort was recently escalated to [facility].  When she is using her inhaler she feels like the medicine stops in her throat and it does not go all the way to her lungs.  She is only used her albuterol about 3 times in the past few months but did not notice a significant benefit. She has a history of chronic sinus allergic symptoms with chronic right-sided  sinusitis due to a deviated septum.  She takes Flonase daily.  She has not previously tried an antihistamine.  She continues to smoke 1 pack/day.  She is smoked about 25 years, her heaviest 4 packs/day in the 4s.  She previously quit for 7 years in early 2000's.  She is planning on starting Chantix later this month to help with smoking cessation.  She has a family history notable for lung cancer in an uncle and her brother who has COPD and asthma.   She has a history significant for coronary artery disease and HFpEF.  She follows with Dr. Geraldo Pitter in cardiology, 08/15/2019 note reviewed.  She had a stent placed in 2020.  Recently she has noticed weight gain, abdominal  distention, mild leg edema, orthopnea requiring her to sleep on 3 pillows.  She has a 1-2 times per night for the past year.  She is not on a diuretic regimen.  She has occasional chest pain for which she takes nitroglycerin, which is her recent baseline since her heart attack last year.    Past Medical History:  Diagnosis Date  . Asthma   . Atherosclerotic heart disease of native coronary artery without angina pectoris   . Chronic maxillary sinusitis   . COPD (chronic obstructive pulmonary disease) ([place])   . Depression   . Fibromyalgia   . GERD (gastroesophageal reflux disease)   . Headache   . Hyperlipidemia   . Hypertensive heart disease without heart failure   . Hypothyroidism (acquired)   . Major depressive disorder, single episode, mild ([place])   . Menopausal and female climacteric states   . Nicotine dependence, cigarettes, with unspecified nicotine-induced disorders   . Osteoarthritis   . Overweight   . RA (rheumatoid arthritis) ([place])    [name]  . Skin cancer    face  . Sleep apnea    cpap  . Type 2 diabetes mellitus ([place])   . Type 2 diabetes mellitus with diabetic nephropathy ([place])   . Vitamin D deficiency, unspecified      Family History  Problem Relation Age of Onset  . Breast cancer Sister   . Lymphoma Sister   . Multiple sclerosis Brother   . Muscular dystrophy Brother   . CAD Other   . Diabetes Other   . Hypertension Other   . Seizures Other   . Lung disease Mother        never smoker  . Lung disease Maternal Uncle        lung cancer  . Lung disease Maternal Uncle   . Lung cancer Father   . Bone cancer Father   . COPD Brother   . Colon cancer Neg Hx   . Colon polyps Neg Hx   . Esophageal cancer Neg Hx   . Stomach cancer Neg Hx   . Rectal cancer Neg Hx      Past Surgical History:  Procedure Laterality Date  . ABDOMINAL HYSTERECTOMY  1984  . BREAST LUMPECTOMY  1976   left, benign  . CHOLECYSTECTOMY  09/05/10  . CORONARY STENT  INTERVENTION N/A 07/12/2018   Procedure: CORONARY STENT INTERVENTION;  Surgeon: Jettie Booze, MD;  Location: [facility];  Service: Cardiovascular;  Laterality: N/A;  . CORONARY THROMBECTOMY N/A 07/12/2018   Procedure: Coronary Thrombectomy;  Surgeon: Jettie Booze, MD;  Location: [facility];  Service: Cardiovascular;  Laterality: N/A;  . INTRAVASCULAR PRESSURE WIRE/FFR STUDY N/A 01/21/2019   Procedure: INTRAVASCULAR PRESSURE WIRE/FFR STUDY;  Surgeon: Jettie Booze, MD;  Location: [facility];  Service: Cardiovascular;  Laterality: N/A;  . LEFT HEART CATH AND CORONARY ANGIOGRAPHY N/A 07/12/2018   Procedure: LEFT HEART CATH AND CORONARY ANGIOGRAPHY;  Surgeon: Jettie Booze, MD;  Location: [facility];  Service: Cardiovascular;  Laterality: N/A;  . LEFT HEART CATH AND CORONARY ANGIOGRAPHY N/A 01/21/2019   Procedure: LEFT HEART CATH AND CORONARY ANGIOGRAPHY;  Surgeon: Jettie Booze, MD;  Location: [facility];  Service: Cardiovascular;  Laterality: N/A;    Social History   Socioeconomic History  . Marital status: Married    Spouse name: Not on file  . Number of children: 6  . Years of education: Not on file  . Highest education level: Not on file  Occupational History  . Occupation: disabled    Comment: jockey  Tobacco Use  . Smoking status: Current Every Day Smoker    Packs/day: 4.00    Years: 30.00    Pack years: 120.00    Types: Cigarettes  . Smokeless tobacco: Current User  . Tobacco comment: now smoking 1 pack a day  Substance and Sexual Activity  . Alcohol use: No  . Drug use: No  . Sexual activity: Not on file  Other Topics Concern  . Not on file  Social History Narrative   Lives with Kendallynn Alvardo husband.   Disabled   Social Determinants of Health   Financial Resource Strain:   . Difficulty of Paying Living Expenses:   Food Insecurity:   . Worried About Charity fundraiser in the Last Year:   . Academic librarian in the Last Year:   Transportation Needs:   . Film/video editor (Medical):   Marland Kitchen Lack of Transportation (Non-Medical):   Physical Activity:   . Days of Exercise per Week:   . Minutes of Exercise per Session:   Stress:   . Feeling of Stress :   Social Connections:   . Frequency of Communication with Friends and Family:   . Frequency of Social Gatherings with Friends and Family:   . Attends Religious Services:   . Active Member of Clubs or Organizations:   . Attends Archivist Meetings:   Marland Kitchen Marital Status:   Intimate Partner Violence:   . Fear of Current or Ex-Partner:   . Emotionally Abused:   Marland Kitchen Physically Abused:   . Sexually Abused:      Allergies  Allergen Reactions  . Shellfish Allergy Anaphylaxis  . Contrast Media [Iodinated Diagnostic Agents] Rash  . Metformin And Related Nausea And Vomiting  . Amitriptyline Hcl Other (See Comments)    Somnolence  . Cymbalta [Duloxetine Hcl] Nausea And Vomiting  . Ultram [Tramadol] Nausea And Vomiting  . Oxycontin [Oxycodone Hcl] Palpitations     Immunization History  Administered Date(s) Administered  . Influenza, High Dose Seasonal PF 03/01/2018  . Influenza,inj,Quad PF,6-35 Mos 05/06/2019  . Influenza-Unspecified 02/03/2013, 03/05/2014  . PFIZER SARS-COV-2 Vaccination 09/05/2019, 09/29/2019  . Pneumococcal Conjugate-13 04/13/2017  . Pneumococcal Polysaccharide-23 03/01/2018  . Pneumococcal-Unspecified 06/05/2010, 09/03/2016    Outpatient Medications Prior to Visit  Medication Sig Dispense Refill  . albuterol (VENTOLIN HFA) 108 (90 Base) MCG/ACT inhaler Inhale 1-2 puffs into the lungs every 6 (six) hours as needed for wheezing or shortness of breath.    Marland Kitchen aspirin EC 81 MG tablet Take 81 mg by mouth daily.    Marland Kitchen BRILINTA 90 MG TABS tablet Take 1 tablet by mouth  twice daily 180 tablet 3  . Budeson-Glycopyrrol-Formoterol (BREZTRI AEROSPHERE) 160-9-4.8 MCG/ACT AERO Inhale 2 puffs into the lungs 2 (two) times  daily. 10.7 g 3  . leflunomide (ARAVA) 20 MG tablet Take 20 mg by mouth daily.    . nitroGLYCERIN (NITROSTAT) 0.4 MG SL tablet Place 1 tablet (0.4 mg total) under the tongue every 5 (five) minutes x 3 doses as needed for chest pain. 25 tablet 12  . predniSONE (DELTASONE) 5 MG tablet Take 1 tablet (5 mg total) by mouth daily as needed for up to 10 doses. 10 tablet 0  . ranolazine (RANEXA) 1000 MG SR tablet Take 0.5 tablets of 500 mg twice daily for 2 weeks then increase to 1 tablet twice daily thereafter. 180 tablet 3  . rosuvastatin (CRESTOR) 10 MG tablet TAKE 1 TABLET AT NIGHT FOR HIGH CHOLESTEROL 90 tablet 1  . varenicline (CHANTIX) 0.5 MG tablet Take 1 tablet (0.5 mg total) by mouth 2 (two) times daily. 60 tablet 0  . furosemide (LASIX) 20 MG tablet Take 1 tablet (20 mg total) by mouth daily. 30 tablet 0   No facility-administered medications prior to visit.    Review of Systems  Constitutional: Negative for chills, fever and weight loss.       6# weight gain in past week  Respiratory: Positive for cough, sputum production, shortness of breath and wheezing.   Cardiovascular: Positive for orthopnea and leg swelling. Negative for chest pain.  Gastrointestinal: Negative for blood in stool, heartburn, melena, nausea and vomiting.  Genitourinary: Negative for hematuria.       Nocturia  Skin: Negative for rash.  Neurological: Negative for focal weakness.  Endo/Heme/Allergies: Positive for environmental allergies.     Objective:   Vitals:   10/07/19 0938  BP: 130/86  Pulse: 93  Temp: 98.1 F (36.7 C)  TempSrc: Temporal  SpO2: 99%  Weight: 175 lb 3.2 oz (79.5 kg)  Height: 5\' 5"  (1.651 m)   99% on   RA BMI Readings from Last 3 Encounters:  10/07/19 29.15 kg/m  09/11/19 30.86 kg/m  08/15/19 28.62 kg/m   Wt Readings from Last 3 Encounters:  10/07/19 175 lb 3.2 oz (79.5 kg)  09/11/19 174 lb 3.2 oz (79 kg)  08/15/19 172 lb (78 kg)    Physical Exam Vitals reviewed.   Constitutional:      Appearance: She is obese. She is not ill-appearing.  HENT:     Head: Normocephalic and atraumatic.  Eyes:     General: No scleral icterus. Cardiovascular:     Rate and Rhythm: Normal rate and regular rhythm.  Pulmonary:     Comments: Breathing comfortably in room air, no conversational dyspnea.  Clear to auscultation bilaterally. Abdominal:     General: There is no distension.     Palpations: Abdomen is soft.  Musculoskeletal:        General: No swelling or deformity.     Cervical back: Neck supple.  Lymphadenopathy:     Cervical: No cervical adenopathy.  Skin:    General: Skin is warm and dry.     Findings: No rash.  Neurological:     General: No focal deficit present.     Mental Status: She is alert.     Coordination: Coordination normal.  Psychiatric:        Mood and Affect: Mood normal.        Behavior: Behavior normal.      CBC    Component Value Date/Time   WBC 8.0  08/15/2019 0745   WBC 15.5 (H) 07/13/2018 0720   RBC 4.56 08/15/2019 0745   RBC 4.42 07/13/2018 0720   HGB 14.3 08/15/2019 0745   HCT 43.0 08/15/2019 0745   PLT 300 08/15/2019 0745   MCV 94 08/15/2019 0745   MCH 31.4 08/15/2019 0745   MCH 29.6 07/13/2018 0720   MCHC 33.3 08/15/2019 0745   MCHC 33.2 07/13/2018 0720   RDW 11.9 08/15/2019 0745   LYMPHSABS 1.8 08/15/2019 0745   MONOABS 0.2 09/14/2016 1025   EOSABS 0.6 (H) 08/15/2019 0745   BASOSABS 0.1 08/15/2019 0745    CHEMISTRY Recent Labs  Lab 10/07/19 1045  NA 138  K 3.9  CL 102  CO2 30  GLUCOSE 138*  BUN 7  CREATININE 0.70  CALCIUM 9.6   Estimated Creatinine Clearance: 70.1 mL/min (by C-G formula based on SCr of 0.7 mg/dL).   Chest Imaging- films reviewed: CXR, 1 view 07/12/2018-mildly increased interstitial markings bilaterally.  Pulmonary Functions Testing Results: PFT Results Latest Ref Rng & Units 09/25/2019  FVC-Pre L 2.25  FVC-Predicted Pre % 69  FVC-Post L 2.27  FVC-Predicted Post % 70  Pre  FEV1/FVC % % 77  Post FEV1/FCV % % 78  FEV1-Pre L 1.73  FEV1-Predicted Pre % 70  FEV1-Post L 1.77  DLCO UNC% % 78  DLCO COR %Predicted % 114  TLC L 4.16  TLC % Predicted % 80  RV % Predicted % 102   2021- No significant obstruction or bronchodilator reversibility. No restriction.  Mild diffusion impairment.  Flow volume loop suggests mixed obstruction and restriction.  Small airways disease present.  Spirometry 01/13/2014: FVC 2.4 (77%) FEV1 1.9 (76%) Ratio 77%   Echocardiogram 07/12/2018: EF 55 to 123456, diastolic dysfunction is present.  Normal LA, RV, RA.  Normal valves.   Echocardiogram 09/18/2019: LVEF XX123456, grade 1 diastolic dysfunction.  Normal LA, RV, RA.  Normal valves.   Heart Catheterization 01/21/2019:   Ost LAD lesion is 40% stenosed.  Previously placed Mid LAD drug eluting stent is widely patentwidely patent.  Ost 1st Mrg lesion is 60% stenosed.  Dist Cx lesion is 25% stenosed.  Prox RCA lesion is 25% stenosed.  The left ventricular systolic function is normal.  LV end diastolic pressure is normal.  The left ventricular ejection fraction is 55-65% by visual estimate.  There is no aortic valve stenosis.   LAD disease moderate at ostium but not significant by DFR.  Some moderate small branch vessel disease present.  Continue medical therapy and aggressive secondary prevention.      Assessment & Plan:     ICD-10-CM   1. Cigarette nicotine dependence with nicotine-induced disorder  F17.219 Ambulatory Referral for Lung Cancer Scre  2. Asthma, chronic obstructive, with acute exacerbation ([place])  J44.1    J45.901   3. Heart failure with preserved ejection fraction, unspecified HF chronicity ([place])  [id number] Basic Metabolic Panel (BMET)  4. Severe persistent asthma without complication  123XX123 Aspergillus IgE Panel    IgE    CBC with Differential  5. DOE (dyspnea on exertion)  123456 Basic Metabolic Panel (BMET)    furosemide (LASIX) 20 MG tablet     DOE, severe  persistent asthma.  PFTs more consistent with asthma, not COPD despite ongoing tobacco abuse. dyspnea on exertion, likely COPD.  Concern for worsening HFpEF, although echocardiogram remains unchanged.  -Continue Breztri twice daily. Recommend continue using albuterol 5 minutes prior to help with breath-hold. -Continue albuterol every 4 hours as needed -Adding Singulair  once daily -Switch loratadine to cetirizine once daily -Checking IgE, CBC with differential to evaluate for allergic asthma.-Checking Aspergillus antibodies given reported history of mold in her house. -Trial of diuretics-restart Lasix 20 mg daily potassium supplements.  BMP today.  If she has cramping, repeat BMP is indicated  Chronic history of asthma, allergic rhinosinusitis -Start cetirizine once daily, stop loratadine. -Start Singulair once daily. -Continue Flonase nasal spray  Tobacco abuse -Continue efforts at smoking cessation.  Congratulated her on cutting down. -Referral for lung cancer screening  RA, currently on leflunomide -At risk for bronchiolitis obliterans due to RA, may require CT scan to diagnose if nonallergic asthma.  RTC in 1 month.    Current Outpatient Medications:  .  albuterol (VENTOLIN HFA) 108 (90 Base) MCG/ACT inhaler, Inhale 1-2 puffs into the lungs every 6 (six) hours as needed for wheezing or shortness of breath., Disp: , Rfl:  .  aspirin EC 81 MG tablet, Take 81 mg by mouth daily., Disp: , Rfl:  .  BRILINTA 90 MG TABS tablet, Take 1 tablet by mouth twice daily, Disp: 180 tablet, Rfl: 3 .  Budeson-Glycopyrrol-Formoterol (BREZTRI AEROSPHERE) 160-9-4.8 MCG/ACT AERO, Inhale 2 puffs into the lungs 2 (two) times daily., Disp: 10.7 g, Rfl: 3 .  furosemide (LASIX) 20 MG tablet, Take 1 tablet (20 mg total) by mouth daily., Disp: 30 tablet, Rfl: 0 .  leflunomide (ARAVA) 20 MG tablet, Take 20 mg by mouth daily., Disp: , Rfl:  .  montelukast (SINGULAIR) 10 MG tablet, Take 1 tablet (10 mg total) by  mouth at bedtime., Disp: 30 tablet, Rfl: 11 .  nitroGLYCERIN (NITROSTAT) 0.4 MG SL tablet, Place 1 tablet (0.4 mg total) under the tongue every 5 (five) minutes x 3 doses as needed for chest pain., Disp: 25 tablet, Rfl: 12 .  potassium chloride (KLOR-CON) 10 MEQ tablet, Take 1 tablet (10 mEq total) by mouth daily., Disp: 30 tablet, Rfl: 0 .  predniSONE (DELTASONE) 5 MG tablet, Take 1 tablet (5 mg total) by mouth daily as needed for up to 10 doses., Disp: 10 tablet, Rfl: 0 .  ranolazine (RANEXA) 1000 MG SR tablet, Take 0.5 tablets of 500 mg twice daily for 2 weeks then increase to 1 tablet twice daily thereafter., Disp: 180 tablet, Rfl: 3 .  rosuvastatin (CRESTOR) 10 MG tablet, TAKE 1 TABLET AT NIGHT FOR HIGH CHOLESTEROL, Disp: 90 tablet, Rfl: 1 .  varenicline (CHANTIX) 0.5 MG tablet, Take 1 tablet (0.5 mg total) by mouth 2 (two) times daily., Disp: 60 tablet, Rfl: 0    Julian Hy, DO [facility] 10/07/2019 1:20 PM

## 2019-10-08 LAB — IGE: IgE (Immunoglobulin E), Serum: 99 kU/L (ref <=114)

## 2019-10-08 NOTE — Progress Notes (Signed)
Please let Brenda Cochran know that her IgE level was normal. Nothing different that we need to do currently. Thanks!

## 2019-10-16 ENCOUNTER — Telehealth: Payer: Self-pay | Admitting: Critical Care Medicine

## 2019-10-16 NOTE — Telephone Encounter (Signed)
10/16/2019  Patient needs to continue her inhalers.  Start the medications that were originally prescribed on 10/07/2019.  She needs to continue to work on stopping smoking as her continued smoking makes it difficult for [place] to manage her breathing long-term.  As for the patient's episodes of hemoptysis.  This is difficult to fully manage telephonically.  If the patient would like she can present to our office and we can try to further evaluate this.  Likely patient would also need an x-ray at that time.  Please also route this message to Dr. Carlis Abbott as Juluis Rainier.  If patient starts to have acute worsening symptoms in addition to her hemoptysis such as chest pain, heart racing, fatigue, dizziness.  She needs to present to an emergency room and seek emergent evaluation.  Wyn Quaker, FNP

## 2019-10-16 NOTE — Telephone Encounter (Signed)
I spoke with pt to discuss lung cancer screening. Pt states that in the last week she has had 3-4 episodes of coughing up bright red blood that was mixed with sputum approx dime sized amounts. Pt denies feeling sick but states that she has had a cough for 4 months and this is the first time she has seen blood. Pt denies allergy symptoms or sinus drainage but does state that she feels some PND when lying down and some stuffy nose related to a deviated septum. Pt denies fever, chills or sweats or any increase in sob. Pt had received both covid vaccines. Pt is taking Claritin and Flonase daily. Has not picked up Singulair from pharmacy yet and has not switched claritin to zyrtec yet. Please advise.

## 2019-10-16 NOTE — Telephone Encounter (Signed)
lmtcb for pt. We need to schedule pt for an acute OV with a NP or Dr. Carlis Abbott next available when pt calls back.

## 2019-10-23 NOTE — Telephone Encounter (Signed)
Pt returning a phone call. Pt can be reached at [phone]. Pt has acute appt scheduled 5/26with B. Mack.

## 2019-10-23 NOTE — Telephone Encounter (Signed)
Spoke with the pt and advised that if she starts to have acute worsening symptoms in addition to her hemoptysis such as chest pain, heart racing, fatigue, dizziness.  She needs to present to an emergency room and seek emergent evaluation.  She verbalized understanding and will keep appt with Aaron Edelman 10/29/19 and I will forward to Dr Carlis Abbott as Juluis Rainier.

## 2019-10-23 NOTE — Telephone Encounter (Signed)
LMTC x 1  

## 2019-10-27 NOTE — Telephone Encounter (Signed)
I agree, thank you!  LPC

## 2019-10-29 ENCOUNTER — Telehealth: Payer: Self-pay | Admitting: Pulmonary Disease

## 2019-10-29 ENCOUNTER — Other Ambulatory Visit (INDEPENDENT_AMBULATORY_CARE_PROVIDER_SITE_OTHER): Payer: Medicare Other

## 2019-10-29 ENCOUNTER — Ambulatory Visit (INDEPENDENT_AMBULATORY_CARE_PROVIDER_SITE_OTHER): Payer: Medicare Other | Admitting: Pulmonary Disease

## 2019-10-29 ENCOUNTER — Encounter: Payer: Self-pay | Admitting: Pulmonary Disease

## 2019-10-29 ENCOUNTER — Other Ambulatory Visit: Payer: Self-pay

## 2019-10-29 VITALS — BP 128/88 | HR 100 | Temp 98.2°F | Ht 65.0 in | Wt 174.0 lb

## 2019-10-29 DIAGNOSIS — J441 Chronic obstructive pulmonary disease with (acute) exacerbation: Secondary | ICD-10-CM

## 2019-10-29 DIAGNOSIS — G44221 Chronic tension-type headache, intractable: Secondary | ICD-10-CM

## 2019-10-29 DIAGNOSIS — J45901 Unspecified asthma with (acute) exacerbation: Secondary | ICD-10-CM

## 2019-10-29 DIAGNOSIS — R0602 Shortness of breath: Secondary | ICD-10-CM

## 2019-10-29 DIAGNOSIS — R7989 Other specified abnormal findings of blood chemistry: Secondary | ICD-10-CM | POA: Diagnosis not present

## 2019-10-29 DIAGNOSIS — R059 Cough, unspecified: Secondary | ICD-10-CM

## 2019-10-29 DIAGNOSIS — R05 Cough: Secondary | ICD-10-CM | POA: Diagnosis not present

## 2019-10-29 DIAGNOSIS — M05749 Rheumatoid arthritis with rheumatoid factor of unspecified hand without organ or systems involvement: Secondary | ICD-10-CM | POA: Diagnosis not present

## 2019-10-29 DIAGNOSIS — F17210 Nicotine dependence, cigarettes, uncomplicated: Secondary | ICD-10-CM | POA: Diagnosis not present

## 2019-10-29 HISTORY — DX: Shortness of breath: R06.02

## 2019-10-29 HISTORY — DX: Cough, unspecified: R05.9

## 2019-10-29 LAB — CBC WITH DIFFERENTIAL/PLATELET
Basophils Absolute: 0.1 10*3/uL (ref 0.0–0.1)
Basophils Relative: 1 % (ref 0.0–3.0)
Eosinophils Absolute: 0.5 10*3/uL (ref 0.0–0.7)
Eosinophils Relative: 5.4 % — ABNORMAL HIGH (ref 0.0–5.0)
HCT: 41.9 % (ref 36.0–46.0)
Hemoglobin: 14.4 g/dL (ref 12.0–15.0)
Lymphocytes Relative: 16.2 % (ref 12.0–46.0)
Lymphs Abs: 1.5 10*3/uL (ref 0.7–4.0)
MCHC: 34.3 g/dL (ref 30.0–36.0)
MCV: 89.9 fl (ref 78.0–100.0)
Monocytes Absolute: 0.9 10*3/uL (ref 0.1–1.0)
Monocytes Relative: 9.8 % (ref 3.0–12.0)
Neutro Abs: 6.1 10*3/uL (ref 1.4–7.7)
Neutrophils Relative %: 67.6 % (ref 43.0–77.0)
Platelets: 342 10*3/uL (ref 150.0–400.0)
RBC: 4.66 Mil/uL (ref 3.87–5.11)
RDW: 13.5 % (ref 11.5–15.5)
WBC: 9 10*3/uL (ref 4.0–10.5)

## 2019-10-29 LAB — COMPREHENSIVE METABOLIC PANEL
ALT: 14 U/L (ref 0–35)
AST: 15 U/L (ref 0–37)
Albumin: 3.5 g/dL (ref 3.5–5.2)
Alkaline Phosphatase: 137 U/L — ABNORMAL HIGH (ref 39–117)
BUN: 9 mg/dL (ref 6–23)
CO2: 25 mEq/L (ref 19–32)
Calcium: 9.2 mg/dL (ref 8.4–10.5)
Chloride: 101 mEq/L (ref 96–112)
Creatinine, Ser: 0.66 mg/dL (ref 0.40–1.20)
GFR: 89.04 mL/min (ref >60.00)
Glucose, Bld: 117 mg/dL — ABNORMAL HIGH (ref 70–99)
Potassium: 3.8 mEq/L (ref 3.5–5.1)
Sodium: 134 mEq/L — ABNORMAL LOW (ref 135–145)
Total Bilirubin: 0.7 mg/dL (ref 0.2–1.2)
Total Protein: 7.4 g/dL (ref 6.0–8.3)

## 2019-10-29 LAB — D-DIMER, QUANTITATIVE: D-Dimer, Quant: 3.96 mcg/mL FEU — ABNORMAL HIGH (ref <0.50)

## 2019-10-29 MED ORDER — BENZONATATE 200 MG PO CAPS: 200.0000 mg | ORAL_CAPSULE | Freq: Three times a day (TID) | ORAL | 1 refills | Status: DC | PRN

## 2019-10-29 NOTE — Patient Instructions (Addendum)
You were seen today by Lauraine Rinne, NP  for:   I have ordered a CT of your chest to further evaluate your symptoms.  I have also ordered lab work that needs to be completed today.  As discussed in office if your symptoms are worsening before we get these results you need to present to an emergency room for further evaluation.  We will follow up with you once we have your lab work results I have placed this order stat.  I have also placed a stat order for a CT of your chest to further evaluate your symptoms.  Please schedule follow-up with primary care.  Keep follow-up with cardiology.  If you start to have sudden chest pain, shortness of breath, heart racing.  Please present to an emergency room for further evaluation.  Brenda Cochran  1. Cough  - CT Chest Wo Contrast; Future - Pro b natriuretic peptide (BNP); Future - D-Dimer, Quantitative; Future - CBC with Differential/Platelet; Future - Comp Met (CMET); Future   Tessalon Perles 200 mg can use 1 tablet every 8 hours for suppression of cough  Start Delsym over-the-counter cough medicine for suppression of cough  2. Shortness of breath  - Pro b natriuretic peptide (BNP); Future - D-Dimer, Quantitative; Future - CBC with Differential/Platelet; Future - Comp Met (CMET); Future   [place]   We recommend today:  Orders Placed This Encounter  Procedures  . CT Chest Wo Contrast    Standing Status:   Future    Standing Expiration Date:   10/28/2020    Order Specific Question:   Preferred imaging location?    Answer:   [place]    Order Specific Question:   Radiology Contrast Protocol - do NOT remove file path    Answer:   [email or web address]  . Pro b natriuretic peptide (BNP)    Standing Status:   Future    Standing Expiration Date:   10/28/2020  . D-Dimer, Quantitative    Standing Status:   Future    Standing Expiration Date:   10/28/2020  . CBC with Differential/Platelet   Standing Status:   Future    Standing Expiration Date:   10/28/2020  . Comp Met (CMET)    Standing Status:   Future    Standing Expiration Date:   10/28/2020   Orders Placed This Encounter  Procedures  . CT Chest Wo Contrast  . Pro b natriuretic peptide (BNP)  . D-Dimer, Quantitative  . CBC with Differential/Platelet  . Comp Met (CMET)   No orders of the defined types were placed in this encounter.   Follow Up:    Return in about 2 weeks (around 11/12/2019), or if symptoms worsen or fail to improve, for Follow up with Dr. Carlis Abbott.   Please do your part to reduce the spread of COVID-19:      Reduce your risk of any infection  and COVID19 by using the similar precautions used for avoiding the common cold or flu:  Marland Kitchen Wash your hands often with soap and warm water for at least 20 seconds.  If soap and water are not readily available, use an alcohol-based hand sanitizer with at least 60% alcohol.  . If coughing or sneezing, cover your mouth and nose by coughing or sneezing into the elbow areas of your shirt or coat, into a tissue or into your sleeve (not your hands). Langley Gauss A MASK when in public  . Avoid shaking hands  with others and consider head nods or verbal greetings only. . Avoid touching your eyes, nose, or mouth with unwashed hands.  . Avoid close contact with people who are sick. . Avoid places or events with large numbers of people in one location, like concerts or sporting events. . If you have some symptoms but not all symptoms, continue to monitor at home and seek medical attention if your symptoms worsen. . If you are having a medical emergency, call 911.   [place] / e-Visit: [email or web address]         [facility]: [facility]: [phone]                   [facility]: [phone]     It is flu season:     >>> Best ways to protect herself from the flu: Receive the yearly flu vaccine, practice good hand hygiene washing with soap and also using hand sanitizer when available, eat a nutritious meals, get adequate rest, hydrate appropriately   Please contact the office if your symptoms worsen or you have concerns that you are not improving.   Thank you for choosing [facility] for your healthcare, and for allowing [place] to partner with you on your healthcare journey. I am thankful to be able to provide care to you today.   Wyn Quaker FNP-C

## 2019-10-29 NOTE — Assessment & Plan Note (Signed)
Acute on chronic exacerbation of headaches Patient reporting persistent headache that has been one of the worst headaches that she has had in a while over the last 3 days Vital signs stable today Patient very fatigued and somnolent in office  Plan: Explained to patient that she needs to seek emergent evaluation with either primary care or urgent care for her headache If they are unable to see the patient within the next 24 hours she needs to present to the emergency room

## 2019-10-29 NOTE — Assessment & Plan Note (Signed)
Plan:  Cont Breztri  Walk today in office Chest x-ray today Lab work Close follow-up with our office

## 2019-10-29 NOTE — Assessment & Plan Note (Addendum)
Plan: Tessalon Perles Start Delsym We will not prescribe narcotic cough syrup given patient's ongoing somnolence in office today

## 2019-10-29 NOTE — Telephone Encounter (Signed)
Spoke with Caryl Pina. She stated that they had received the patient's labwork but it was packaged in [place] material. She wanted to know if we wanted it to be sent to [place]. In order to prevent the patient from having to come back to [facility] for labs, I advised her to send it to [place]. She verbalized understanding.   Nothing further needed at time of call.

## 2019-10-29 NOTE — Assessment & Plan Note (Signed)
Plan: Continue follow-up with rheumatology Continue Arava 

## 2019-10-29 NOTE — Assessment & Plan Note (Signed)
Unsure what is driving patient shortness of breath Given her history of a DVT as well as slight tachycardia in office today we will need to rule out pulmonary embolism Patient also needs CT axial imaging Patient extremely somnolent in office today as well  Plan: Lab work today Stat CT chest ordered May need to change CT chest to CT angio based off lab work Emphasized importance of the patient to stop smoking

## 2019-10-29 NOTE — Assessment & Plan Note (Signed)
Plan: Emphasized need to stop smoking 

## 2019-10-29 NOTE — Progress Notes (Signed)
@Patient  ID: Brenda Cochran, female    DOB: January 23, 1951, 69 y.o.   MRN: [id number]  Chief Complaint  Patient presents with  . Follow-up    Continued coughing for the past 4 months. Has not seen any blood in her phlegm since 5/13. Increased SOB, especially with exertion.     Referring provider: Rochel Brome, MD  HPI:  69 year old female current everyday smoker followed in our office for asthma, obstructive sleep apnea, chronic cough  PMH: Osteoarthritis, rheumatoid arthritis, GERD, TIA, history of non-STEMI, hypertension, CAD Smoker/ Smoking History: Current smoker.  11 to 12 cigarettes a day. 120 pack year history.  Maintenance:   Pt of: Dr. Carlis Abbott  10/29/2019  - Visit   69 year old female current everyday smoker  followed in our office for chronic cough and dyspnea.  She is followed by Dr. Carlis Abbott.  At last office visit in May/2021 it was recommended that she remain on braze tree, start Singulair, switch to cetirizine, trial of Lasix out of concern for potential CHF exacerbation, and emphasized the importance of her stopping smoking   Patient presenting to office today reporting occasional bouts of hemoptysis.  She has not utilized any sort of cough suppressants.  She reports adherence to her inhalers.  She never started the Lasix.  She also delayed starting the Singulair.  She has not yet followed up with primary care or cardiology regarding her symptoms.  She continues to smoke 10 cigarettes a day.  She denies any recent episodes of hemoptysis over the past 3 to 4 days.  She continues to report ongoing back pain which she feels is muscle spasms.  She also continues to report left-sided wincing chest pain.  Slightly tachycardic on arrival to exam today.  Oxygen saturations are stable.  Patient was walked in office today and she was able to complete a walk without any significant hypoxemia.  Patient was very dyspneic to the entire physical walk.  Patient did not want to present to the  emergency room because she did not want to "chance Covid".  Patient has received both Covid vaccines.  Patient has a history of lower extremity left DVT when she was pregnant when she was younger.  She denies any recent blood clotting issues.  She remains adherent to daily baby aspirin as well as Brilinta.  Wells Criteria Modified Wells criteria: Clinical assessment for PE Clinical symptoms of DVT (leg swelling, pain with palpation) - 3 Other diagnoses less likely than pulmonary embolism - 3 Heart rate greater than 100 - 1.5 Immobilization (disease greater in 3 days) or surgery in the previous 4 weeks - 1.5 Previous DVT/PE - 1.5 Hemoptysis - 1 Malignancy - 1  Probability Traditional clinical probability assessment (Wells criteria) High - greater than 6 Moderate - 2 to 6 Low less than 2  Simplify clinical probability assessment (modified Wells criteria) PE likely-greater than 4 PE unlikely little less than or equal to 4   Questionaires / Pulmonary Flowsheets:   ACT:  Asthma Control Test ACT Total Score  10/07/2019 13    MMRC: mMRC Dyspnea Scale mMRC Score  10/29/2019 2    Epworth:  No flowsheet data found.  Tests:   Chest Imaging CXR, 1 view 07/12/2018-mildly increased interstitial markings bilaterally.  Pulmonary Functions Testing Results:  2021- No significant obstruction or bronchodilator reversibility. No restriction.  Mild diffusion impairment.  Flow volume loop suggests mixed obstruction and restriction.  Small airways disease present.  Spirometry 01/13/2014: FVC 2.4 (77%) FEV1 1.9 (76%) Ratio 77%  Echocardiogram 07/12/2018: EF 55 to 123456, diastolic dysfunction is present.  Normal LA, RV, RA.  Normal valves.   Echocardiogram 09/18/2019: LVEF [id number], grade 1 diastolic dysfunction.  Normal LA, RV, RA.  Normal valves.   Heart Catheterization 01/21/2019:   Ost LAD lesion is 40% stenosed.  Previously placed Mid LAD drug eluting stent is widely patentwidely  patent.  Ost 1st Mrg lesion is 60% stenosed.  Dist Cx lesion is 25% stenosed.  Prox RCA lesion is 25% stenosed.  The left ventricular systolic function is normal.  LV end diastolic pressure is normal.  The left ventricular ejection fraction is 55-65% by visual estimate.  There is no aortic valve stenosis.   LAD disease moderate at ostium but not significant by DFR.  Some moderate small branch vessel disease present.  Continue medical therapy and aggressive secondary prevention.        FENO:  No results found for: NITRICOXIDE  PFT: PFT Results Latest Ref Rng & Units 09/25/2019  FVC-Pre L 2.25  FVC-Predicted Pre % 69  FVC-Post L 2.27  FVC-Predicted Post % 70  Pre FEV1/FVC % % 77  Post FEV1/FCV % % 78  FEV1-Pre L 1.73  FEV1-Predicted Pre % 70  FEV1-Post L 1.77  DLCO UNC% % 78  DLCO COR %Predicted % 114  TLC L 4.16  TLC % Predicted % 80  RV % Predicted % 102    WALK:  SIX MIN WALK 10/29/2019 01/13/2014  Supplimental Oxygen during Test? (L/min) No No  Tech Comments: Patient was able to complete 1 lap at a slow pace. She had to stop twice during walk to catch her breath. No O2 needed during or after walk. Requested to stop after 1 1/2 laps due to sob.    Imaging: No results found.  Lab Results:  CBC    Component Value Date/Time   WBC 9.0 10/29/2019 1258   RBC 4.66 10/29/2019 1258   HGB 14.4 10/29/2019 1258   HGB 14.3 08/15/2019 0745   HCT 41.9 10/29/2019 1258   HCT 43.0 08/15/2019 0745   PLT 342.0 10/29/2019 1258   PLT 300 08/15/2019 0745   MCV 89.9 10/29/2019 1258   MCV 94 08/15/2019 0745   MCH 31.4 08/15/2019 0745   MCH 29.6 07/13/2018 0720   MCHC 34.3 10/29/2019 1258   RDW 13.5 10/29/2019 1258   RDW 11.9 08/15/2019 0745   LYMPHSABS 1.5 10/29/2019 1258   LYMPHSABS 1.8 08/15/2019 0745   MONOABS 0.9 10/29/2019 1258   EOSABS 0.5 10/29/2019 1258   EOSABS 0.6 (H) 08/15/2019 0745   BASOSABS 0.1 10/29/2019 1258   BASOSABS 0.1 08/15/2019 0745    BMET     Component Value Date/Time   NA 134 (L) 10/29/2019 1258   NA 142 08/15/2019 0745   K 3.8 10/29/2019 1258   CL 101 10/29/2019 1258   CO2 25 10/29/2019 1258   GLUCOSE 117 (H) 10/29/2019 1258   BUN 9 10/29/2019 1258   BUN 10 08/15/2019 0745   CREATININE 0.66 10/29/2019 1258   CALCIUM 9.2 10/29/2019 1258   GFRNONAA 88 08/15/2019 0745   GFRAA 102 08/15/2019 0745    BNP No results found for: BNP  ProBNP No results found for: PROBNP  Specialty Problems      Pulmonary Problems   Asthma, chronic obstructive, with acute exacerbation ([place])    [place] 01/13/2014  FEV1 76% predicted FVC 77% predicted FEV1 to FVC ratio 100% predicted FEF 25 7569% predicted CXR 01/13/14: NAD Alpha one antitrypsin 156  IgE 56 sl elevated.  RAST neg       OSA (obstructive sleep apnea)    Sleep study 02/19/14 : AHI 5.5.  Mild sleep apnea.  Lowest SpO2 81%      Cough   Shortness of breath      Allergies  Allergen Reactions  . Shellfish Allergy Anaphylaxis  . Contrast Media [Iodinated Diagnostic Agents] Rash  . Metformin And Related Nausea And Vomiting  . Amitriptyline Hcl Other (See Comments)    Somnolence  . Cymbalta [Duloxetine Hcl] Nausea And Vomiting  . Ultram [Tramadol] Nausea And Vomiting  . Oxycontin [Oxycodone Hcl] Palpitations    Immunization History  Administered Date(s) Administered  . Influenza, High Dose Seasonal PF 03/01/2018  . Influenza,inj,Quad PF,6-35 Mos 05/06/2019  . Influenza-Unspecified 02/03/2013, 03/05/2014  . PFIZER SARS-COV-2 Vaccination 09/05/2019, 09/29/2019  . Pneumococcal Conjugate-13 04/13/2017  . Pneumococcal Polysaccharide-23 03/01/2018  . Pneumococcal-Unspecified 06/05/2010, 09/03/2016    Past Medical History:  Diagnosis Date  . Asthma   . Atherosclerotic heart disease of native coronary artery without angina pectoris   . Chronic maxillary sinusitis   . COPD (chronic obstructive pulmonary disease) ([place])   . Depression   . Fibromyalgia   . GERD  (gastroesophageal reflux disease)   . Headache   . Hyperlipidemia   . Hypertensive heart disease without heart failure   . Hypothyroidism (acquired)   . Major depressive disorder, single episode, mild ([place])   . Menopausal and female climacteric states   . Nicotine dependence, cigarettes, with unspecified nicotine-induced disorders   . Osteoarthritis   . Overweight   . RA (rheumatoid arthritis) ([place])    Brenda Cochran  . Skin cancer    face  . Sleep apnea    cpap  . Type 2 diabetes mellitus ([place])   . Type 2 diabetes mellitus with diabetic nephropathy ([place])   . Vitamin D deficiency, unspecified     Tobacco History: Social History   Tobacco Use  Smoking Status Current Every Day Smoker  . Packs/day: 4.00  . Years: 30.00  . Pack years: 120.00  . Types: Cigarettes  Smokeless Tobacco Current User  Tobacco Comment   now smoking 1/2 pack a day   Ready to quit: Not Answered Counseling given: Not Answered Comment: now smoking 1/2 pack a day   Continue to not smoke  Outpatient Encounter Medications as of 10/29/2019  Medication Sig  . albuterol (VENTOLIN HFA) 108 (90 Base) MCG/ACT inhaler Inhale 1-2 puffs into the lungs every 6 (six) hours as needed for wheezing or shortness of breath.  Marland Kitchen aspirin EC 81 MG tablet Take 81 mg by mouth daily.  Marland Kitchen BRILINTA 90 MG TABS tablet Take 1 tablet by mouth twice daily  . Budeson-Glycopyrrol-Formoterol (BREZTRI AEROSPHERE) 160-9-4.8 MCG/ACT AERO Inhale 2 puffs into the lungs 2 (two) times daily.  . furosemide (LASIX) 20 MG tablet Take 1 tablet (20 mg total) by mouth daily.  Marland Kitchen leflunomide (ARAVA) 20 MG tablet Take 20 mg by mouth daily.  . montelukast (SINGULAIR) 10 MG tablet Take 1 tablet (10 mg total) by mouth at bedtime.  . nitroGLYCERIN (NITROSTAT) 0.4 MG SL tablet Place 1 tablet (0.4 mg total) under the tongue every 5 (five) minutes x 3 doses as needed for chest pain.  . potassium chloride (KLOR-CON) 10 MEQ tablet Take 1 tablet (10 mEq total)  by mouth daily.  . predniSONE (DELTASONE) 5 MG tablet Take 1 tablet (5 mg total) by mouth daily as needed for up to 10 doses.  Marland Kitchen  ranolazine (RANEXA) 1000 MG SR tablet Take 0.5 tablets of 500 mg twice daily for 2 weeks then increase to 1 tablet twice daily thereafter.  . rosuvastatin (CRESTOR) 10 MG tablet TAKE 1 TABLET AT NIGHT FOR HIGH CHOLESTEROL  . varenicline (CHANTIX) 0.5 MG tablet Take 1 tablet (0.5 mg total) by mouth 2 (two) times daily.  . benzonatate (TESSALON) 200 MG capsule Take 1 capsule (200 mg total) by mouth 3 (three) times daily as needed for cough.   No facility-administered encounter medications on file as of 10/29/2019.     Review of Systems  Review of Systems  Constitutional: Positive for fatigue. Negative for activity change and fever.  HENT: Negative for sinus pressure, sinus pain and sore throat.   Respiratory: Positive for cough and shortness of breath. Negative for wheezing.   Cardiovascular: Positive for chest pain. Negative for palpitations and leg swelling.  Gastrointestinal: Negative for diarrhea, nausea and vomiting.  Musculoskeletal: Positive for back pain. Negative for arthralgias.  Neurological: Negative for dizziness.  Psychiatric/Behavioral: Negative for sleep disturbance. The patient is not nervous/anxious.      Physical Exam  BP 128/88   Pulse 100   Temp 98.2 F (36.8 C) (Temporal)   Ht 5\' 5"  (1.651 m)   Wt 174 lb (78.9 kg)   SpO2 97% Comment: on RA  BMI 28.96 kg/m   Wt Readings from Last 5 Encounters:  10/29/19 174 lb (78.9 kg)  10/07/19 175 lb 3.2 oz (79.5 kg)  09/11/19 174 lb 3.2 oz (79 kg)  08/15/19 172 lb (78 kg)  08/07/19 169 lb (76.7 kg)    BMI Readings from Last 5 Encounters:  10/29/19 28.96 kg/m  10/07/19 29.15 kg/m  09/11/19 30.86 kg/m  08/15/19 28.62 kg/m  08/07/19 28.12 kg/m     Physical Exam Vitals and nursing note reviewed.  Constitutional:      General: She is not in acute distress.    Appearance: Normal  appearance. She is normal weight. She is ill-appearing and toxic-appearing.  HENT:     Head: Normocephalic and atraumatic.     Right Ear: External ear normal.     Left Ear: External ear normal.     Nose: Nose normal. No congestion.     Mouth/Throat:     Mouth: Mucous membranes are moist.     Pharynx: Oropharynx is clear.  Eyes:     Pupils: Pupils are equal, round, and reactive to light.  Cardiovascular:     Rate and Rhythm: Normal rate and regular rhythm.     Pulses: Normal pulses.     Heart sounds: Normal heart sounds. No murmur.  Pulmonary:     Effort: Pulmonary effort is normal. No respiratory distress.     Breath sounds: No decreased air movement. No decreased breath sounds, wheezing or rales.  Musculoskeletal:     Cervical back: Normal range of motion.     Right lower leg: No edema.     Left lower leg: No edema.  Skin:    General: Skin is warm and dry.     Capillary Refill: Capillary refill takes less than 2 seconds.  Neurological:     General: No focal deficit present.     Mental Status: She is alert and oriented to person, place, and time. Mental status is at baseline.     Gait: Gait (Tolerated walk in office) normal.  Psychiatric:        Mood and Affect: Mood normal.        Behavior:  Behavior normal.        Thought Content: Thought content normal.        Judgment: Judgment normal.       Assessment & Plan:   Asthma, chronic obstructive, with acute exacerbation Plan:  Cont Breztri  Walk today in office Chest x-ray today Lab work Close follow-up with our office  RA (rheumatoid arthritis) ([place]) Plan: Continue follow-up with rheumatology Continue Arava  Chronic tension-type headache, intractable Acute on chronic exacerbation of headaches Patient reporting persistent headache that has been one of the worst headaches that she has had in a while over the last 3 days Vital signs stable today Patient very fatigued and somnolent in office  Plan: Explained to  patient that she needs to seek emergent evaluation with either primary care or urgent care for her headache If they are unable to see the patient within the next 24 hours she needs to present to the emergency room  Shortness of breath Unsure what is driving patient shortness of breath Given her history of a DVT as well as slight tachycardia in office today we will need to rule out pulmonary embolism Patient also needs CT axial imaging Patient extremely somnolent in office today as well  Plan: Lab work today Stat CT chest ordered May need to change CT chest to CT angio based off lab work Emphasized importance of the patient to stop smoking  Cough Plan: [employer] We will not prescribe narcotic cough syrup given patient's ongoing somnolence in office today    Return in about 2 weeks (around 11/12/2019), or if symptoms worsen or fail to improve, for Follow up with Dr. Carlis Abbott.   Lauraine Rinne, NP 10/29/2019   This appointment required 35 minutes of patient care (this includes precharting, chart review, review of results, face-to-face care, etc.).

## 2019-10-30 ENCOUNTER — Telehealth: Payer: Self-pay | Admitting: Pulmonary Disease

## 2019-10-30 ENCOUNTER — Encounter (HOSPITAL_COMMUNITY): Payer: Self-pay

## 2019-10-30 ENCOUNTER — Ambulatory Visit (HOSPITAL_COMMUNITY)
Admission: RE | Admit: 2019-10-30 | Discharge: 2019-10-30 | Disposition: A | Discharge status: Home / Self Care | Payer: Medicare Other | Source: Ambulatory Visit | Attending: Pulmonary Disease | Admitting: Pulmonary Disease

## 2019-10-30 ENCOUNTER — Emergency Department (HOSPITAL_COMMUNITY)
Admission: EM | Admit: 2019-10-30 | Discharge: 2019-10-31 | Disposition: A | Discharge status: Home / Self Care | Payer: Medicare Other | Attending: Emergency Medicine | Admitting: Emergency Medicine

## 2019-10-30 ENCOUNTER — Other Ambulatory Visit: Payer: Self-pay

## 2019-10-30 ENCOUNTER — Emergency Department (HOSPITAL_COMMUNITY): Payer: Medicare Other

## 2019-10-30 DIAGNOSIS — R079 Chest pain, unspecified: Secondary | ICD-10-CM

## 2019-10-30 DIAGNOSIS — J90 Pleural effusion, not elsewhere classified: Secondary | ICD-10-CM | POA: Diagnosis not present

## 2019-10-30 DIAGNOSIS — R0789 Other chest pain: Secondary | ICD-10-CM | POA: Insufficient documentation

## 2019-10-30 DIAGNOSIS — R0602 Shortness of breath: Secondary | ICD-10-CM

## 2019-10-30 DIAGNOSIS — R7989 Other specified abnormal findings of blood chemistry: Secondary | ICD-10-CM

## 2019-10-30 DIAGNOSIS — I2510 Atherosclerotic heart disease of native coronary artery without angina pectoris: Secondary | ICD-10-CM | POA: Diagnosis not present

## 2019-10-30 DIAGNOSIS — E119 Type 2 diabetes mellitus without complications: Secondary | ICD-10-CM | POA: Diagnosis not present

## 2019-10-30 DIAGNOSIS — Z7982 Long term (current) use of aspirin: Secondary | ICD-10-CM | POA: Insufficient documentation

## 2019-10-30 DIAGNOSIS — Z85828 Personal history of other malignant neoplasm of skin: Secondary | ICD-10-CM | POA: Insufficient documentation

## 2019-10-30 DIAGNOSIS — F17210 Nicotine dependence, cigarettes, uncomplicated: Secondary | ICD-10-CM | POA: Insufficient documentation

## 2019-10-30 DIAGNOSIS — Z955 Presence of coronary angioplasty implant and graft: Secondary | ICD-10-CM | POA: Diagnosis not present

## 2019-10-30 DIAGNOSIS — Z79899 Other long term (current) drug therapy: Secondary | ICD-10-CM | POA: Diagnosis not present

## 2019-10-30 DIAGNOSIS — I10 Essential (primary) hypertension: Secondary | ICD-10-CM | POA: Insufficient documentation

## 2019-10-30 DIAGNOSIS — J449 Chronic obstructive pulmonary disease, unspecified: Secondary | ICD-10-CM | POA: Insufficient documentation

## 2019-10-30 DIAGNOSIS — E039 Hypothyroidism, unspecified: Secondary | ICD-10-CM | POA: Insufficient documentation

## 2019-10-30 LAB — TROPONIN I (HIGH SENSITIVITY)
Troponin I (High Sensitivity): 3 ng/L (ref <18)
Troponin I (High Sensitivity): 3 ng/L (ref <18)

## 2019-10-30 LAB — CBC
HCT: 40.3 % (ref 36.0–46.0)
Hemoglobin: 13 g/dL (ref 12.0–15.0)
MCH: 30.1 pg (ref 26.0–34.0)
MCHC: 32.3 g/dL (ref 30.0–36.0)
MCV: 93.3 fL (ref 80.0–100.0)
Platelets: 351 10*3/uL (ref 150–400)
RBC: 4.32 MIL/uL (ref 3.87–5.11)
RDW: 12.3 % (ref 11.5–15.5)
WBC: 8.1 10*3/uL (ref 4.0–10.5)
nRBC: 0 % (ref 0.0–0.2)

## 2019-10-30 LAB — BASIC METABOLIC PANEL
Anion gap: 10 (ref 5–15)
BUN: 9 mg/dL (ref 8–23)
CO2: 25 mmol/L (ref 22–32)
Calcium: 8.8 mg/dL — ABNORMAL LOW (ref 8.9–10.3)
Chloride: 103 mmol/L (ref 98–111)
Creatinine, Ser: 0.6 mg/dL (ref 0.44–1.00)
GFR calc Af Amer: >60 mL/min (ref >60)
GFR calc non Af Amer: >60 mL/min (ref >60)
Glucose, Bld: 184 mg/dL — ABNORMAL HIGH (ref 70–99)
Potassium: 4.3 mmol/L (ref 3.5–5.1)
Sodium: 138 mmol/L (ref 135–145)

## 2019-10-30 LAB — PRO B NATRIURETIC PEPTIDE: NT-Pro BNP: 75 pg/mL (ref 0–301)

## 2019-10-30 MED ORDER — IOHEXOL 350 MG/ML SOLN
100.0000 mL | Freq: Once | INTRAVENOUS | Status: AC | PRN
  Administered 2019-10-30: 100 mL via INTRAVENOUS

## 2019-10-30 MED ORDER — METHYLPREDNISOLONE SODIUM SUCC 125 MG IJ SOLR: 40.0000 mg | Freq: Once | INTRAMUSCULAR | Status: DC

## 2019-10-30 MED ORDER — METHYLPREDNISOLONE SODIUM SUCC 40 MG IJ SOLR
40.0000 mg | Freq: Once | INTRAMUSCULAR | Status: AC
  Administered 2019-10-30: 40 mg via INTRAVENOUS
  Filled 2019-10-30: qty 1

## 2019-10-30 MED ORDER — SODIUM CHLORIDE (PF) 0.9 % IJ SOLN
INTRAMUSCULAR | Status: AC
  Filled 2019-10-30: qty 50

## 2019-10-30 MED ORDER — DIPHENHYDRAMINE HCL 50 MG/ML IJ SOLN
INTRAMUSCULAR | Status: AC
  Filled 2019-10-30: qty 1

## 2019-10-30 MED ORDER — DIPHENHYDRAMINE HCL 50 MG/ML IJ SOLN: 50.0000 mg | Freq: Once | INTRAMUSCULAR | Status: DC

## 2019-10-30 MED ORDER — DIPHENHYDRAMINE HCL 50 MG/ML IJ SOLN
50.0000 mg | Freq: Once | INTRAMUSCULAR | Status: AC
  Administered 2019-10-30: 50 mg via INTRAVENOUS

## 2019-10-30 NOTE — ED Provider Notes (Signed)
[facility] DEPT Provider Note   CSN: [id number] Arrival date & time: 10/30/19  1927     History No chief complaint on file.   Brenda Cochran is a 69 y.o. female.  Patient being followed by pulmonary medicine.  Patient's had a cough for about 4 months now.  Known to have COPD still a smoker.  Patient had an elevated D-dimer so was referred in for CT angio.  Originally went to short stay because she needed prep due to having a dye allergy.  She developed some chest pressure while there so was brought down here.  Initial EKG here had no acute findings.  She had had the chest pressure for about an hour when I first saw her.  And it was improving.  Patient is oxygen sats are 95%.  Patient does not use oxygen at home.        Past Medical History:  Diagnosis Date  . Asthma   . Atherosclerotic heart disease of native coronary artery without angina pectoris   . Chronic maxillary sinusitis   . COPD (chronic obstructive pulmonary disease) ([place])   . Depression   . Fibromyalgia   . GERD (gastroesophageal reflux disease)   . Headache   . Hyperlipidemia   . Hypertensive heart disease without heart failure   . Hypothyroidism (acquired)   . Major depressive disorder, single episode, mild ([place])   . Menopausal and female climacteric states   . Nicotine dependence, cigarettes, with unspecified nicotine-induced disorders   . Osteoarthritis   . Overweight   . RA (rheumatoid arthritis) ([place])    [name]  . Skin cancer    face  . Sleep apnea    cpap  . Type 2 diabetes mellitus ([place])   . Type 2 diabetes mellitus with diabetic nephropathy ([place])   . Vitamin D deficiency, unspecified     Patient Active Problem List   Diagnosis Date Noted  . Shortness of breath 10/29/2019  . Cough 10/29/2019  . Cigarette nicotine dependence with nicotine-induced disorder 08/18/2019  . Palpitations 08/15/2019  . Chronic tension-type headache, intractable 08/07/2019  .  Continuous dependence on cigarette smoking 06/09/2019  . Angina pectoris ([employer]) 01/10/2019  . CAD (coronary artery disease) 09/23/2018  . Dyslipidemia associated with type 2 diabetes mellitus ([place]) 08/23/2018  . Coronary artery disease involving native coronary artery of native heart without angina pectoris 07/25/2018  . Essential hypertension 07/18/2018  . NSTEMI (non-ST elevated myocardial infarction) ([place]) 07/12/2018  . Pain in right knee 07/04/2017  . Numbness and tingling in left upper extremity   . TIA (transient ischemic attack) 09/14/2016  . OSA (obstructive sleep apnea) 01/13/2014  . Asthma, chronic obstructive, with acute exacerbation ([place])   . Osteoarthritis   . Mixed hyperlipidemia   . Fibromyalgia   . RA (rheumatoid arthritis) ([place])   . Skin cancer   . Hypothyroidism (acquired)   . GERD (gastroesophageal reflux disease)   . Depression     Past Surgical History:  Procedure Laterality Date  . ABDOMINAL HYSTERECTOMY  1984  . BREAST LUMPECTOMY  1976   left, benign  . CHOLECYSTECTOMY  09/05/10  . CORONARY STENT INTERVENTION N/A 07/12/2018   Procedure: CORONARY STENT INTERVENTION;  Surgeon: Jettie Booze, MD;  Location: [facility];  Service: Cardiovascular;  Laterality: N/A;  . CORONARY THROMBECTOMY N/A 07/12/2018   Procedure: Coronary Thrombectomy;  Surgeon: Jettie Booze, MD;  Location: [facility];  Service: Cardiovascular;  Laterality: N/A;  .  INTRAVASCULAR PRESSURE WIRE/FFR STUDY N/A 01/21/2019   Procedure: INTRAVASCULAR PRESSURE WIRE/FFR STUDY;  Surgeon: Jettie Booze, MD;  Location: [facility];  Service: Cardiovascular;  Laterality: N/A;  . LEFT HEART CATH AND CORONARY ANGIOGRAPHY N/A 07/12/2018   Procedure: LEFT HEART CATH AND CORONARY ANGIOGRAPHY;  Surgeon: Jettie Booze, MD;  Location: [facility];  Service: Cardiovascular;  Laterality: N/A;  . LEFT HEART CATH AND CORONARY ANGIOGRAPHY N/A 01/21/2019   Procedure: LEFT  HEART CATH AND CORONARY ANGIOGRAPHY;  Surgeon: Jettie Booze, MD;  Location: [facility];  Service: Cardiovascular;  Laterality: N/A;     OB History   No obstetric history on file.     Family History  Problem Relation Age of Onset  . Breast cancer Sister   . Lymphoma Sister   . Multiple sclerosis Brother   . Muscular dystrophy Brother   . CAD Other   . Diabetes Other   . Hypertension Other   . Seizures Other   . Lung disease Mother        never smoker  . Lung disease Maternal Uncle        lung cancer  . Lung disease Maternal Uncle   . Lung cancer Father   . Bone cancer Father   . COPD Brother   . Colon cancer Neg Hx   . Colon polyps Neg Hx   . Esophageal cancer Neg Hx   . Stomach cancer Neg Hx   . Rectal cancer Neg Hx     Social History   Tobacco Use  . Smoking status: Current Every Day Smoker    Packs/day: 4.00    Years: 30.00    Pack years: 120.00    Types: Cigarettes  . Smokeless tobacco: Current User  . Tobacco comment: now smoking 1/2 pack a day  Substance Use Topics  . Alcohol use: No  . Drug use: No    Home Medications Prior to Admission medications   Medication Sig Start Date End Date Taking? Authorizing Provider  albuterol (VENTOLIN HFA) 108 (90 Base) MCG/ACT inhaler Inhale 1-2 puffs into the lungs every 6 (six) hours as needed for wheezing or shortness of breath.   Yes [provider]  aspirin EC 81 MG tablet Take 81 mg by mouth daily.   Yes [provider]  BRILINTA 90 MG TABS tablet Take 1 tablet by mouth twice daily 08/21/19  Yes Revankar, Reita Cliche, MD  Budeson-Glycopyrrol-Formoterol (BREZTRI AEROSPHERE) 160-9-4.8 MCG/ACT AERO Inhale 2 puffs into the lungs 2 (two) times daily. 08/07/19  Yes Cox, Kirsten, MD  diphenhydrAMINE (BENADRYL) 25 mg capsule Take 50 mg by mouth every 6 (six) hours as needed for itching or allergies.    Yes [provider]  furosemide (LASIX) 20 MG tablet Take 1 tablet (20 mg total) by  mouth daily. 10/07/19  Yes Noemi Chapel P, DO  leflunomide (ARAVA) 20 MG tablet Take 20 mg by mouth daily.   Yes [provider]  montelukast (SINGULAIR) 10 MG tablet Take 1 tablet (10 mg total) by mouth at bedtime. 10/07/19  Yes Noemi Chapel P, DO  nitroGLYCERIN (NITROSTAT) 0.4 MG SL tablet Place 1 tablet (0.4 mg total) under the tongue every 5 (five) minutes x 3 doses as needed for chest pain. 02/07/19  Yes Revankar, Reita Cliche, MD  potassium chloride (KLOR-CON) 10 MEQ tablet Take 1 tablet (10 mEq total) by mouth daily. 10/07/19  Yes Noemi Chapel P, DO  predniSONE (DELTASONE) 5 MG tablet  Take 1 tablet (5 mg total) by mouth daily as needed for up to 10 doses. Patient taking differently: Take 5 mg by mouth daily as needed (ra/inflammation).  08/07/19  Yes Cox, Kirsten, MD  ranolazine (RANEXA) 1000 MG SR tablet Take 0.5 tablets of 500 mg twice daily for 2 weeks then increase to 1 tablet twice daily thereafter. Patient taking differently: Take 1,000 mg by mouth 2 (two) times daily.  02/07/19  Yes Revankar, Reita Cliche, MD  rosuvastatin (CRESTOR) 10 MG tablet TAKE 1 TABLET AT NIGHT FOR HIGH CHOLESTEROL Patient taking differently: Take 10 mg by mouth daily.  07/28/19  Yes Cox, Kirsten, MD  benzonatate (TESSALON) 200 MG capsule Take 1 capsule (200 mg total) by mouth 3 (three) times daily as needed for cough. 10/29/19   Lauraine Rinne, NP  varenicline (CHANTIX) 0.5 MG tablet Take 1 tablet (0.5 mg total) by mouth 2 (two) times daily. Patient not taking: Reported on 10/30/2019 08/07/19   CoxElnita [place], MD    Allergies    Shellfish allergy, Contrast media [iodinated diagnostic agents], Metformin and related, Amitriptyline hcl, Cymbalta [duloxetine hcl], Ultram [tramadol], and Oxycontin [oxycodone hcl]  Review of Systems   Review of Systems  Constitutional: Negative for chills and fever.  HENT: Negative for rhinorrhea and sore throat.   Eyes: Negative for visual disturbance.  Respiratory: Positive for cough and  shortness of breath.   Cardiovascular: Positive for chest pain. Negative for leg swelling.  Gastrointestinal: Negative for abdominal pain, diarrhea, nausea and vomiting.  Genitourinary: Negative for dysuria.  Musculoskeletal: Negative for back pain and neck pain.  Skin: Negative for rash.  Neurological: Negative for dizziness, light-headedness and headaches.  Hematological: Does not bruise/bleed easily.  Psychiatric/Behavioral: Negative for confusion.    Physical Exam Updated Vital Signs BP 133/83   Pulse 81   Temp 98.2 F (36.8 C) (Oral)   Resp (!) 29   SpO2 (!) 89%   Physical Exam Vitals and nursing note reviewed.  Constitutional:      General: She is not in acute distress.    Appearance: Normal appearance. She is well-developed.  HENT:     Head: Normocephalic and atraumatic.  Eyes:     Extraocular Movements: Extraocular movements intact.     Conjunctiva/sclera: Conjunctivae normal.     Pupils: Pupils are equal, round, and reactive to light.  Cardiovascular:     Rate and Rhythm: Normal rate and regular rhythm.     Heart sounds: No murmur.  Pulmonary:     Effort: Pulmonary effort is normal. No respiratory distress.     Breath sounds: Normal breath sounds. No wheezing.  Abdominal:     Palpations: Abdomen is soft.     Tenderness: There is no abdominal tenderness.  Musculoskeletal:        General: No swelling. Normal range of motion.     Cervical back: Normal range of motion and neck supple.  Skin:    General: Skin is warm and dry.  Neurological:     General: No focal deficit present.     Mental Status: She is alert and oriented to person, place, and time.     ED Results / Procedures / Treatments   Labs (all labs ordered are listed, but only abnormal results are displayed) Labs Reviewed  BASIC METABOLIC PANEL - Abnormal; Notable for the following components:      Result Value   Glucose, Bld 184 (*)    Calcium 8.8 (*)    All other components within  normal  limits  CBC  TROPONIN I (HIGH SENSITIVITY)  TROPONIN I (HIGH SENSITIVITY)    EKG None   ED ECG REPORT   Date: 10/30/2019  Rate: 96  Rhythm: normal sinus rhythm  QRS Axis: normal  Intervals: normal  ST/T Wave abnormalities: normal  Conduction Disutrbances:none  Narrative Interpretation:   Old EKG Reviewed: none available Abnormal R wave progression early transition. I have personally reviewed the EKG tracing and agree with the computerized printout as noted.   Radiology CT Angio Chest PE W/Cm &/Or Wo Cm  Result Date: 10/30/2019 CLINICAL DATA:  Chest pain. Shortness of breath. Chest pressure. Rule out pulmonary embolism. EXAM: CT ANGIOGRAPHY CHEST WITH CONTRAST TECHNIQUE: Multidetector CT imaging of the chest was performed using the standard protocol during bolus administration of intravenous contrast. Multiplanar CT image reconstructions and MIPs were obtained to evaluate the vascular anatomy. CONTRAST:  163mL OMNIPAQUE IOHEXOL 350 MG/ML SOLN COMPARISON:  07/12/2018 chest radiograph. Chest CT from [facility] 04/24/2019. FINDINGS: Cardiovascular: The quality of this exam for evaluation of pulmonary embolism is good. No evidence of pulmonary embolism. Aortic atherosclerosis. Tortuous thoracic aorta. Mild cardiomegaly, without pericardial effusion. Multivessel coronary artery atherosclerosis. Lad stent. Mediastinum/Nodes: No supraclavicular adenopathy. Right paratracheal node measures 8 mm on 34/6 versus 4 mm on the prior. No hilar adenopathy. Lungs/Pleura: Small, partially loculated right-sided pleural effusion (loculation identified laterally). No well-defined pleural mass. Bibasilar bronchial wall thickening with areas of fluid or secretions within the right lower lobe bronchi. Areas of compressive atelectasis involving the right lower lobe. A pleural-based right upper lobe 3 mm pulmonary nodule on 32/8 is present on 04/24/2019, measuring 2-3 mm. Similar mild thickening of the left  major fissure superiorly on 23/8. Upper Abdomen: Cholecystectomy. Normal imaged portions of the liver, spleen, stomach, pancreas, left adrenal gland, and kidneys. A right adrenal nodule of 1.2 cm on 137/6 is similar in size on the prior, low-density on that exam, consistent with an adenoma. Mildly prominent porta hepatis nodes are likely reactive. Musculoskeletal: No acute osseous abnormality. Review of the MIP images confirms the above findings. IMPRESSION: 1.  No evidence of pulmonary embolism. 2. Small right pleural effusion with mild loculation laterally. 3. Right greater than left bronchial wall thickening with secretions or material in the right lower lobe bronchi. This may represent acute on chronic bronchitis or aspiration. 4. Right adrenal adenoma. 5. Coronary artery atherosclerosis. Aortic Atherosclerosis (ICD10-I70.0). Electronically Signed   By: Abigail Miyamoto M.D.   On: 10/30/2019 20:17    Procedures Procedures (including critical care time)  Medications Ordered in ED Medications  iohexol (OMNIPAQUE) 350 MG/ML injection 100 mL (100 mLs Intravenous Contrast Given 10/30/19 1946)    ED Course  I have reviewed the triage vital signs and the nursing notes.  Pertinent labs & imaging results that were available during my care of the patient were reviewed by me and considered in my medical decision making (see chart for details).    MDM Rules/Calculators/A&P                     CT angio without any acute findings other than a right-sided pleural effusion.  No evidence of pulmonary embolus.  EKG and cardiac troponins x2 are normal.  No evidence of an acute cardiac event.  We will have patient follow back up with pulmonary medicine.  Patient not hypoxic.  Stable for discharge home.  Final Clinical Impression(s) / ED Diagnoses Final diagnoses:  Elevated d-dimer  Chest pain, unspecified type  Pleural effusion on right    Rx / DC Orders ED Discharge Orders    None       Fredia Sorrow, MD 10/31/19 [phone]

## 2019-10-30 NOTE — Progress Notes (Signed)
Assumed care for Pt at 1600 from Lavon Paganini, RN who administered solumedrol at 1550.  Clarified plan with flow coordinator with Bridget Hartshorn, RN for pt who states Pt is to be transported back to radiology dept at 1830 for CT scan after receiving premeds in short stay.  At 1700, Confirmed plan with Radiology tech [place] who states she works in ED at [employer] and plans to have Pt wait in ED waiting area until she can have CT done at [facility] as ordered. Pt short of breath pushing herself up in bed and ambulating to bathroom.   At 1825, Pt complained of chest pressure 5/10. Benadryl 50mg  IV given at 1850. Notified Tyler Aas of Pt status, called ED charge Erline Levine to obtain ED room assignment.  Transported Pt to ED 24 via stretcher . At 1915 Report given to Lorayne Marek RN and ED physician who assume care.  Pt's husband Ronalee Belts notified of her transfer to ED.

## 2019-10-30 NOTE — Telephone Encounter (Signed)
ATC patient,  Left detailed message per DPR to tell patient to take 50mg  benadryl prior to her CTA chest.

## 2019-10-30 NOTE — Telephone Encounter (Signed)
Addressed by Wyn Quaker FNP, see result note Nothing further needed at this time

## 2019-10-30 NOTE — ED Notes (Signed)
EKG completed by Joellen Jersey, RN.

## 2019-10-31 ENCOUNTER — Telehealth: Payer: Self-pay | Admitting: Pulmonary Disease

## 2019-10-31 DIAGNOSIS — J90 Pleural effusion, not elsewhere classified: Secondary | ICD-10-CM

## 2019-10-31 NOTE — Discharge Instructions (Addendum)
Work-up today CT angio chest without evidence of any pulmonary embolus.  Also work-up for the chest pressure without any acute findings.  Follow-up with pulmonary medicine give him a call.  Return for any new or worse symptoms.  As we talked about CT scan of the chest did show a right-sided pleural effusion.  Follow-up with pulmonary medicine for this.

## 2019-10-31 NOTE — Telephone Encounter (Signed)
10/31/2019  Patient was last evaluated in our office on 10/29/2019.  At that time lab work was completed as well as a stat CT chest was ordered.  D-dimer was elevated and with patient's history of lower extremity DVT as well as elevated Wells criteria stat CT chest was changed to a CT angiogram.  Patient was scheduled for CTA chest in the outpatient setting the patient has a contrast dye allergy.  Attempts were made to continue to have the CTA chest completed in the outpatient setting.  Unfortunately due to staffing and short stay as well as radiology this was difficult to complete.  Patient also started to develop acute worsened chest pain while waiting to complete CTA chest and take premeds on 10/30/2019.  Short stay nurse Maudie Mercury contacted Wyn Quaker, FNP to notify of patient's chest pain.  They reported that they are taking the patient to the emergency room for further evaluation.  I have reviewed patient's emergency room note as well as recent CTA chest.  Patient CTA chest does not show any acute findings or pulmonary embolism.  Does show right-sided pleural effusion with partial loculation.  EKG and troponins were both normal.  Will route to Dr. Carlis Abbott as Juluis Rainier since patient will be seeing her next on 11/06/2019.  Wyn Quaker, FNP

## 2019-10-31 NOTE — Telephone Encounter (Signed)
Left message for patient to call back  

## 2019-10-31 NOTE — Telephone Encounter (Signed)
For that loculated effusion can we refer to IR for them to hopefully do a thora before her appt with me? That is why she is having pain and it could be infected if it hurts. It is probably inflammatory for some reason. I am worried I won't have a block in my schedule on 6/3 to be able to do it in the office.  LPC

## 2019-10-31 NOTE — Telephone Encounter (Signed)
10/31/2019  Discussed case with Dr. Carlis Abbott.  We would like to proceed forward with a Thoracentesis through IR  Please place order.  Please also place labs for: Routine culture, glucose, protein, LDH, cytology, cell count with differential  Patient will also need blood drawn at the same time for LDH, c-Met  Please go ahead and place order.  We will route to triage to contact patient as well schedule and place orders.  Will route to Dr. Carlis Abbott as Juluis Rainier.  Wyn Quaker, FNP

## 2019-10-31 NOTE — Telephone Encounter (Signed)
Thanks!  LPC 

## 2019-10-31 NOTE — Telephone Encounter (Signed)
Not a problem yes we can work on coordinating this.Triage,Can we please contact the patient and place referral to IR to see if the patient can be set up for a thoracentesis outpatient.  We likely will not be able to complete this in office on 11/06/2019 due to scheduling.It would be ideal that this would be completed today if possible.Wyn Quaker, FNP

## 2019-11-04 ENCOUNTER — Telehealth: Payer: Self-pay

## 2019-11-04 NOTE — Telephone Encounter (Signed)
Pt called back-- please return call anytime today [phone]

## 2019-11-04 NOTE — Telephone Encounter (Signed)
Brenda Cochran called to report that Dr. Carlis Abbott from [facility] has done a CT of the chest which shows that she has fluid in the chest cavity.  He is recommending that she be scheduled for a procedure to remove the fluid.  She wanted to make Dr. Tobie Poet aware.

## 2019-11-04 NOTE — Telephone Encounter (Signed)
Spoke with pt, she has a lot of questions about the procedure and she does have an appt with Dr. Carlis Abbott on 6/3 at 10am. Before I order anything, she wants to discuss the details of the procedure etc. FYI Dr. Carlis Abbott.

## 2019-11-04 NOTE — Telephone Encounter (Signed)
I spoke to Brenda Cochran to let her know that I recommend a thoracentesis to remove the fluid that is likely the cause of her symptoms. We will not know more about where it came from or if it will continue to accumulate unless we are able to sample the fluid. She is willing to proceed with doing it soon.   LPC

## 2019-11-04 NOTE — Telephone Encounter (Signed)
LMTCB x 1 

## 2019-11-04 NOTE — Telephone Encounter (Signed)
Pt returning a phone call. Pt can be reached at [phone].

## 2019-11-05 NOTE — Telephone Encounter (Signed)
Patient called back to office states she talked with Dr. Carlis Abbott last night and was going to have the Thoracentesis done in IR.   Spoke with Dr. Carlis Abbott asked if she wanted patient to still follow up in office tomorrow if patient doesn't have Inocencio [facility] done, she states she will keep the follow up on 11/06/19  placed order per Brian's previous note Routing to [facility] to follow up and get scheduled.

## 2019-11-05 NOTE — Telephone Encounter (Signed)
lmam for patient , spouse and daughter with new appt time

## 2019-11-06 ENCOUNTER — Other Ambulatory Visit: Payer: Self-pay

## 2019-11-06 ENCOUNTER — Encounter: Payer: Self-pay | Admitting: Critical Care Medicine

## 2019-11-06 ENCOUNTER — Ambulatory Visit (INDEPENDENT_AMBULATORY_CARE_PROVIDER_SITE_OTHER)
Admission: RE | Admit: 2019-11-06 | Discharge: 2019-11-06 | Disposition: A | Discharge status: Home / Self Care | Payer: Medicare Other | Source: Ambulatory Visit | Attending: Critical Care Medicine | Admitting: Critical Care Medicine

## 2019-11-06 ENCOUNTER — Ambulatory Visit (INDEPENDENT_AMBULATORY_CARE_PROVIDER_SITE_OTHER): Payer: Medicare Other | Admitting: Critical Care Medicine

## 2019-11-06 ENCOUNTER — Ambulatory Visit: Payer: Medicare Other | Admitting: Critical Care Medicine

## 2019-11-06 ENCOUNTER — Ambulatory Visit: Payer: Medicare Other | Admitting: Internal Medicine

## 2019-11-06 ENCOUNTER — Telehealth: Payer: Self-pay | Admitting: Critical Care Medicine

## 2019-11-06 VITALS — BP 118/72 | HR 104 | Temp 97.8°F | Ht 65.0 in | Wt 172.8 lb

## 2019-11-06 DIAGNOSIS — R609 Edema, unspecified: Secondary | ICD-10-CM | POA: Diagnosis not present

## 2019-11-06 DIAGNOSIS — J449 Chronic obstructive pulmonary disease, unspecified: Secondary | ICD-10-CM

## 2019-11-06 DIAGNOSIS — J90 Pleural effusion, not elsewhere classified: Secondary | ICD-10-CM | POA: Diagnosis not present

## 2019-11-06 DIAGNOSIS — R0609 Other forms of dyspnea: Secondary | ICD-10-CM

## 2019-11-06 DIAGNOSIS — F17219 Nicotine dependence, cigarettes, with unspecified nicotine-induced disorders: Secondary | ICD-10-CM | POA: Diagnosis not present

## 2019-11-06 DIAGNOSIS — R1310 Dysphagia, unspecified: Secondary | ICD-10-CM | POA: Diagnosis not present

## 2019-11-06 DIAGNOSIS — R0600 Dyspnea, unspecified: Secondary | ICD-10-CM

## 2019-11-06 LAB — COMPREHENSIVE METABOLIC PANEL
ALT: 12 U/L (ref 0–35)
AST: 10 U/L (ref 0–37)
Albumin: 3.5 g/dL (ref 3.5–5.2)
Alkaline Phosphatase: 116 U/L (ref 39–117)
BUN: 9 mg/dL (ref 6–23)
CO2: 28 mEq/L (ref 19–32)
Calcium: 9.6 mg/dL (ref 8.4–10.5)
Chloride: 99 mEq/L (ref 96–112)
Creatinine, Ser: 0.65 mg/dL (ref 0.40–1.20)
GFR: 90.62 mL/min (ref >60.00)
Glucose, Bld: 158 mg/dL — ABNORMAL HIGH (ref 70–99)
Potassium: 3.5 mEq/L (ref 3.5–5.1)
Sodium: 136 mEq/L (ref 135–145)
Total Bilirubin: 0.3 mg/dL (ref 0.2–1.2)
Total Protein: 7.3 g/dL (ref 6.0–8.3)

## 2019-11-06 LAB — BODY FLUID CELL COUNT WITH DIFFERENTIAL
Eos, Fluid: 9 %
Lymphs, Fluid: 64 %
Monocyte-Macrophage-Serous Fluid: 26 % — ABNORMAL LOW (ref 50–90)
Neutrophil Count, Fluid: 1 % (ref 0–25)
Total Nucleated Cell Count, Fluid: 1938 cu mm — ABNORMAL HIGH (ref 0–1000)

## 2019-11-06 LAB — PROTEIN, TOTAL: Total Protein: 7.2 g/dL (ref 6.0–8.3)

## 2019-11-06 NOTE — Telephone Encounter (Signed)
I Have put this patient on  Dr Carlis Abbott schedule at 11:30 for the thoro

## 2019-11-06 NOTE — Telephone Encounter (Signed)
Minimal pleural fluid obtained, but preliminarily it is lymphocyte predominant with minimal PMNs. Unsure the yield of the pathology review with such a small specimen. Awaiting results of LDH & protein.  Will need repeat thoracentesis. Due to positioning difficulties performing it in the office, I would recommend doing it with IR or in same-day surgery.  I attempted to call to discuss the initial results. No answer; left a message.  Julian Hy, DO 11/06/19 7:02 PM [facility]

## 2019-11-06 NOTE — Progress Notes (Signed)
Synopsis: Referred in April 2021 for COPD, asthma by Rochel Brome, MD.  Subjective:   PATIENT ID: Brenda Cochran GENDER: female DOB: Aug 29, 1950, MRN: [id number]  Chief Complaint  Patient presents with  . Follow-up    Brenda Cochran is a 69 y/o woman with a history of R-sided CP and COPD who presents for follow up of a pleural effusion and right sided chest pain. She was told in the ED she had acute bronchitis but was not given antibiotics. Her CT scan there demonstrated the partially loculated appearing effusion. Her symptoms are unchanged. She has some DOE, but is well controlled on [place]. She has no started chantix yet because she has a lot going on and doesn't feel ready to quit smoking yet.  She has chronic dysphagia and an occasional sticking sensation in her chest when eating which is gotten worse over several years.  She had a previous barium esophagram in 2015 which demonstrated a small caliber esophagus but no strictures.  She has never been evaluated by GI.     OV 09/11/19: Brenda Cochran is a 69 year old woman who presents for evaluation of chronic cough and dyspnea.  She was diagnosed with COPD during 11-day hospitalization in late 2010.  She subsequently followed up with Dr. Carren Rang who told her it was more asthma than COPD.  She was started on inhalers none.  She last saw Dr. Carren Rang several years ago.  She is unsure if she had PFTs in the past.  She had relatively stable symptoms until about 3 months ago when she had a persistent cough.  Her cough feels productive, but she is unable to expectorate sputum.  She previously had discolored sputum, which is cleared.  She coughs periodically throughout the day, but it is worse later in the day and overnight.  She has been coughing so much that she has rib soreness.  She is short of breath all the time even when sitting still, but worse with activity.  She has occasional wheezing.  Nothing seems to make her symptoms better or worse.  She has been  tested for Covid several times, all negative.  She was started on prednisone by her primary care provider.  She was feeling worse and went to urgent care in February when she was started on azithromycin, Tessalon, albuterol.  She had mild benefit on azithromycin, but had recurrence of symptoms azithromycin stopped.  Her Symbicort was recently escalated to [facility].  When she is using her inhaler she feels like the medicine stops in her throat and it does not go all the way to her lungs.  She is only used her albuterol about 3 times in the past few months but did not notice a significant benefit. She has a history of chronic sinus allergic symptoms with chronic right-sided sinusitis due to a deviated septum.  She takes Flonase daily.  She has not previously tried an antihistamine.  She continues to smoke 1 pack/day.  She is smoked about 25 years, her heaviest 4 packs/day in the 15s.  She previously quit for 7 years in early 2000's.  She is planning on starting Chantix later this month to help with smoking cessation.  She has a family history notable for lung cancer in an uncle and her brother who has COPD and asthma.   She has a history significant for coronary artery disease and HFpEF.  She follows with Dr. Geraldo Pitter in cardiology, 08/15/2019 note reviewed.  She had a stent placed in 2020.  Recently  she has noticed weight gain, abdominal distention, mild leg edema, orthopnea requiring her to sleep on 3 pillows.  She has a 1-2 times per night for the past year.  She is not on a diuretic regimen.  She has occasional chest pain for which she takes nitroglycerin, which is her recent baseline since her heart attack last year.    Past Medical History:  Diagnosis Date  . Asthma   . Atherosclerotic heart disease of native coronary artery without angina pectoris   . Chronic maxillary sinusitis   . COPD (chronic obstructive pulmonary disease) ([place])   . Depression   . Fibromyalgia   . GERD (gastroesophageal reflux  disease)   . Headache   . Hyperlipidemia   . Hypertensive heart disease without heart failure   . Hypothyroidism (acquired)   . Major depressive disorder, single episode, mild ([place])   . Menopausal and female climacteric states   . Nicotine dependence, cigarettes, with unspecified nicotine-induced disorders   . Osteoarthritis   . Overweight   . RA (rheumatoid arthritis) ([place])    [name]  . Skin cancer    face  . Sleep apnea    cpap  . Type 2 diabetes mellitus ([place])   . Type 2 diabetes mellitus with diabetic nephropathy ([place])   . Vitamin D deficiency, unspecified      Family History  Problem Relation Age of Onset  . Breast cancer Sister   . Lymphoma Sister   . Multiple sclerosis Brother   . Muscular dystrophy Brother   . CAD Other   . Diabetes Other   . Hypertension Other   . Seizures Other   . Lung disease Mother        never smoker  . Lung disease Maternal Uncle        lung cancer  . Lung disease Maternal Uncle   . Lung cancer Father   . Bone cancer Father   . COPD Brother   . Colon cancer Neg Hx   . Colon polyps Neg Hx   . Esophageal cancer Neg Hx   . Stomach cancer Neg Hx   . Rectal cancer Neg Hx      Past Surgical History:  Procedure Laterality Date  . ABDOMINAL HYSTERECTOMY  1984  . BREAST LUMPECTOMY  1976   left, benign  . CHOLECYSTECTOMY  09/05/10  . CORONARY STENT INTERVENTION N/A 07/12/2018   Procedure: CORONARY STENT INTERVENTION;  Surgeon: Jettie Booze, MD;  Location: [place] [facility];  Service: Cardiovascular;  Laterality: N/A;  . CORONARY THROMBECTOMY N/A 07/12/2018   Procedure: Coronary Thrombectomy;  Surgeon: Jettie Booze, MD;  Location: [place] [facility];  Service: Cardiovascular;  Laterality: N/A;  . INTRAVASCULAR PRESSURE WIRE/FFR STUDY N/A 01/21/2019   Procedure: INTRAVASCULAR PRESSURE WIRE/FFR STUDY;  Surgeon: Jettie Booze, MD;  Location: [place] [facility];  Service: Cardiovascular;  Laterality: N/A;  . LEFT  HEART CATH AND CORONARY ANGIOGRAPHY N/A 07/12/2018   Procedure: LEFT HEART CATH AND CORONARY ANGIOGRAPHY;  Surgeon: Jettie Booze, MD;  Location: [place] [facility];  Service: Cardiovascular;  Laterality: N/A;  . LEFT HEART CATH AND CORONARY ANGIOGRAPHY N/A 01/21/2019   Procedure: LEFT HEART CATH AND CORONARY ANGIOGRAPHY;  Surgeon: Jettie Booze, MD;  Location: [place] [facility];  Service: Cardiovascular;  Laterality: N/A;    Social History   Socioeconomic History  . Marital status: Married    Spouse name: Not on file  . Number of children: 6  .  Years of education: Not on file  . Highest education level: Not on file  Occupational History  . Occupation: disabled    Comment: jockey  Tobacco Use  . Smoking status: Current Every Day Smoker    Packs/day: 4.00    Years: 30.00    Pack years: 120.00    Types: Cigarettes  . Smokeless tobacco: Current User  . Tobacco comment: now smoking 1/2 pack a day 11/06/19 [facility]  Substance and Sexual Activity  . Alcohol use: No  . Drug use: No  . Sexual activity: Not on file  Other Topics Concern  . Not on file  Social History Narrative   Lives with Areil Ottey husband.   Disabled   Social Determinants of Health   Financial Resource Strain:   . Difficulty of Paying Living Expenses:   Food Insecurity:   . Worried About Charity fundraiser in the Last Year:   . Arboriculturist in the Last Year:   Transportation Needs:   . Film/video editor (Medical):   Marland Kitchen Lack of Transportation (Non-Medical):   Physical Activity:   . Days of Exercise per Week:   . Minutes of Exercise per Session:   Stress:   . Feeling of Stress :   Social Connections:   . Frequency of Communication with Friends and Family:   . Frequency of Social Gatherings with Friends and Family:   . Attends Religious Services:   . Active Member of Clubs or Organizations:   . Attends Archivist Meetings:   Marland Kitchen Marital Status:   Intimate Partner Violence:   . Fear  of Current or Ex-Partner:   . Emotionally Abused:   Marland Kitchen Physically Abused:   . Sexually Abused:      Allergies  Allergen Reactions  . Shellfish Allergy Anaphylaxis  . Contrast Media [Iodinated Diagnostic Agents] Rash  . Metformin And Related Nausea And Vomiting  . Amitriptyline Hcl Other (See Comments)    Somnolence  . Cymbalta [Duloxetine Hcl] Nausea And Vomiting  . Ultram [Tramadol] Nausea And Vomiting  . Oxycontin [Oxycodone Hcl] Palpitations     Immunization History  Administered Date(s) Administered  . Influenza, High Dose Seasonal PF 03/01/2018  . Influenza,inj,Quad PF,6-35 Mos 05/06/2019  . Influenza-Unspecified 02/03/2013, 03/05/2014  . PFIZER SARS-COV-2 Vaccination 09/05/2019, 09/29/2019  . Pneumococcal Conjugate-13 04/13/2017  . Pneumococcal Polysaccharide-23 03/01/2018  . Pneumococcal-Unspecified 06/05/2010, 09/03/2016    Outpatient Medications Prior to Visit  Medication Sig Dispense Refill  . albuterol (VENTOLIN HFA) 108 (90 Base) MCG/ACT inhaler Inhale 1-2 puffs into the lungs every 6 (six) hours as needed for wheezing or shortness of breath.    Marland Kitchen aspirin EC 81 MG tablet Take 81 mg by mouth daily.    . benzonatate (TESSALON) 200 MG capsule Take 1 capsule (200 mg total) by mouth 3 (three) times daily as needed for cough. 30 capsule 1  . BRILINTA 90 MG TABS tablet Take 1 tablet by mouth twice daily 180 tablet 3  . Budeson-Glycopyrrol-Formoterol (BREZTRI AEROSPHERE) 160-9-4.8 MCG/ACT AERO Inhale 2 puffs into the lungs 2 (two) times daily. 10.7 g 3  . diphenhydrAMINE (BENADRYL) 25 mg capsule Take 50 mg by mouth every 6 (six) hours as needed for itching or allergies.     . furosemide (LASIX) 20 MG tablet Take 1 tablet (20 mg total) by mouth daily. 30 tablet 0  . leflunomide (ARAVA) 20 MG tablet Take 20 mg by mouth daily.    . montelukast (SINGULAIR) 10 MG tablet Take  1 tablet (10 mg total) by mouth at bedtime. 30 tablet 11  . nitroGLYCERIN (NITROSTAT) 0.4 MG SL tablet  Place 1 tablet (0.4 mg total) under the tongue every 5 (five) minutes x 3 doses as needed for chest pain. 25 tablet 12  . potassium chloride (KLOR-CON) 10 MEQ tablet Take 1 tablet (10 mEq total) by mouth daily. 30 tablet 0  . predniSONE (DELTASONE) 5 MG tablet Take 1 tablet (5 mg total) by mouth daily as needed for up to 10 doses. (Patient taking differently: Take 5 mg by mouth daily as needed (ra/inflammation). ) 10 tablet 0  . ranolazine (RANEXA) 1000 MG SR tablet Take 0.5 tablets of 500 mg twice daily for 2 weeks then increase to 1 tablet twice daily thereafter. (Patient taking differently: Take 1,000 mg by mouth 2 (two) times daily. ) 180 tablet 3  . rosuvastatin (CRESTOR) 10 MG tablet TAKE 1 TABLET AT NIGHT FOR HIGH CHOLESTEROL (Patient taking differently: Take 10 mg by mouth daily. ) 90 tablet 1  . varenicline (CHANTIX) 0.5 MG tablet Take 1 tablet (0.5 mg total) by mouth 2 (two) times daily. 60 tablet 0   No facility-administered medications prior to visit.    Review of Systems  Constitutional: Negative for chills, fever and weight loss.       6# weight gain in past week  Respiratory: Positive for cough, sputum production, shortness of breath and wheezing.   Cardiovascular: Positive for orthopnea and leg swelling. Negative for chest pain.  Gastrointestinal: Negative for blood in stool, heartburn, melena, nausea and vomiting.  Genitourinary: Negative for hematuria.       Nocturia  Skin: Negative for rash.  Neurological: Negative for focal weakness.  Endo/Heme/Allergies: Positive for environmental allergies.     Objective:   Vitals:   11/06/19 1159  BP: 118/72  Pulse: (!) 104  Temp: 97.8 F (36.6 C)  TempSrc: Oral  SpO2: 98%  Weight: 172 lb 12.8 oz (78.4 kg)  Height: _0  (1.651 m)   98% on   RA BMI Readings from Last 3 Encounters:  11/06/19 28.76 kg/m  10/29/19 28.96 kg/m  10/07/19 29.15 kg/m   Wt Readings from Last 3 Encounters:  11/06/19 172 lb 12.8 oz (78.4 kg)   10/29/19 174 lb (78.9 kg)  10/07/19 175 lb 3.2 oz (79.5 kg)    Physical Exam Vitals reviewed.  HENT:     Head: Normocephalic and atraumatic.  Eyes:     General: No scleral icterus. Cardiovascular:     Rate and Rhythm: Normal rate and regular rhythm.     Heart sounds: No murmur.  Pulmonary:     Comments: Reduced right basilar breath sounds, otherwise clear to auscultation bilaterally.  No accessory muscle use.  Bedside ultrasound on the right demonstrated free-flowing simple appearing fluid, about 1.5 cm deep in the dependent right posterior lateral pleural space. Abdominal:     General: There is no distension.     Palpations: Abdomen is soft.  Musculoskeletal:        General: No swelling or deformity.     Cervical back: Neck supple.  Lymphadenopathy:     Cervical: No cervical adenopathy.  Skin:    General: Skin is warm and dry.     Findings: No rash.  Neurological:     General: No focal deficit present.     Mental Status: She is alert.     Coordination: Coordination normal.  Psychiatric:        Mood and Affect: Mood  normal.        Behavior: Behavior normal.       Thoracentesis 11/06/19: Informed consent discussion, including risks, benefits, alternatives. She understands that her bleeding risk is increased due to chronic Brilinta use, but feels that her risk of clotting issues make stopping the Brilinta increased risk.  I agree. A timeout was performed. Right pleural space examined with ultrasound.  Skin site marked in a location with about 1 cm of pleural fluid.  The area was anesthetized with 1% lidocaine.  A small skin nick was made and the catheter was inserted.  A small volume of clear yellow fluid was obtained, and the catheter was advanced over the needle.  ~3cc clear, amber pleural fluid was able to be obtained before the fluid stopped flowing.  The catheter was slowly withdrawn, but additional fluid was unable to be obtained. No significant bleeding.  A bandage was  placed over the site.  Follow-up chest x-ray ordered.      CBC    Component Value Date/Time   WBC 8.1 10/30/2019 2047   RBC 4.32 10/30/2019 2047   HGB 13.0 10/30/2019 2047   HGB 14.3 08/15/2019 0745   HCT 40.3 10/30/2019 2047   HCT 43.0 08/15/2019 0745   PLT 351 10/30/2019 2047   PLT 300 08/15/2019 0745   MCV 93.3 10/30/2019 2047   MCV 94 08/15/2019 0745   MCH 30.1 10/30/2019 2047   MCHC 32.3 10/30/2019 2047   RDW 12.3 10/30/2019 2047   RDW 11.9 08/15/2019 0745   LYMPHSABS 1.5 10/29/2019 1258   LYMPHSABS 1.8 08/15/2019 0745   MONOABS 0.9 10/29/2019 1258   EOSABS 0.5 10/29/2019 1258   EOSABS 0.6 (H) 08/15/2019 0745   BASOSABS 0.1 10/29/2019 1258   BASOSABS 0.1 08/15/2019 0745    CHEMISTRY Recent Labs  Lab 10/30/19 2047  NA 138  K 4.3  CL 103  CO2 25  GLUCOSE 184*  BUN 9  CREATININE 0.60  CALCIUM 8.8*   Estimated Creatinine Clearance: 69.7 mL/min (by C-G formula based on SCr of 0.6 mg/dL).   Chest Imaging- films reviewed: CXR, 1 view 07/12/2018-mildly increased interstitial markings bilaterally.  CT chest 10/30/2019- right loculated pleural effusion with overlying compressive atelectasis.  2 linear opacities in this region.  Bronchial wall thickening.  Pulmonary Functions Testing Results: PFT Results Latest Ref Rng & Units 09/25/2019  FVC-Pre L 2.25  FVC-Predicted Pre % 69  FVC-Post L 2.27  FVC-Predicted Post % 70  Pre FEV1/FVC % % 77  Post FEV1/FCV % % 78  FEV1-Pre L 1.73  FEV1-Predicted Pre % 70  FEV1-Post L 1.77  DLCO UNC% % 78  DLCO COR %Predicted % 114  TLC L 4.16  TLC % Predicted % 80  RV % Predicted % 102   2021-no significant obstruction or bronchodilator reversibility. No significant restriction.  Mild diffusion impairment.  (used Breztri prior)   Spirometry 01/13/2014: FVC 2.4 (77%) FEV1 1.9 (76%) Ratio 77%   Echocardiogram 07/12/2018: EF 55 to 67%, diastolic dysfunction is present.  Normal LA, RV, RA.  Normal valves.    Heart  Catheterization 01/21/2019:   Ost LAD lesion is 40% stenosed.  Previously placed Mid LAD drug eluting stent is widely patentwidely patent.  Ost 1st Mrg lesion is 60% stenosed.  Dist Cx lesion is 25% stenosed.  Prox RCA lesion is 25% stenosed.  The left ventricular systolic function is normal.  LV end diastolic pressure is normal.  The left ventricular ejection fraction is 55-65% by visual estimate.  There is no aortic valve stenosis.   LAD disease moderate at ostium but not significant by DFR.  Some moderate small branch vessel disease present.  Continue medical therapy and aggressive secondary prevention.      Assessment & Plan:     ICD-10-CM   1. Loculated pleural effusion  J90 Protein, body fluid (other)    Body fluid cell count with differential    LD, Body Fluid (other)    Lactate dehydrogenase    Protein, total    DG Chest 2 View    IR THORACENTESIS ASP PLEURAL SPACE W/IMG GUIDE    Protein, total    Comp Met (CMET)    Lactate Dehydrogenase (LDH)    Lactate dehydrogenase  2. Dysphagia, unspecified type  R13.10 Ambulatory referral to Gastroenterology  3. Cigarette nicotine dependence with nicotine-induced disorder  F17.219   4. Chronic obstructive pulmonary disease, unspecified COPD type ([place])  J44.9   5. DOE (dyspnea on exertion)  W96.04 Basic Metabolic Panel (BMET)  6. Edema, unspecified type  V40.9 Basic Metabolic Panel (BMET)    Partially loculated pleural effusion associated with chest pain, concern for exudative effusion.  Bedside ultrasound confirmed this was amenable to sampling -Bedside thora.  Due to limited fluid, LDH, protein, depth will be sent.  Serum protein and LDH. -Follow-up chest x-ray -Based on the above studies, will make a decision if she needs to have an additional thoracentesis versus close monitoring.  Would not attempt another bedside ultrasound due to difficulties with positioning.  Chronic dyspnea on exertion with smoking-related  obstructive lung disease. -Discussed recommendation for smoking cessation -Continue Breztri BID.  Continue to use albuterol 5 minutes prior to help with breath-hold. -Continue albuterol every 4 hours as needed -Up-to-date on Covid, pneumococcal, flu vaccines  Tobacco abuse -Recommend complete smoking cessation, although cutting back still has benefits.  We discussed Chantix as well as nicotine replacement therapy and behavioral modifications that can promote success with quitting smoking.  I have asked her to please let us know if there is anything we can do to assist with her quitting efforts. -She understands my concern for pleural effusions in the setting of chronic tobacco abuse.  Will send cytology if repeat thoracentesis is performed as it was unable to be obtained today due to inadequate volume of fluid. -Need to discuss lung cancer screening in 1 year.  Not discussed today: Chronic history of asthma, allergic rhinosinusitis -Start oral antihistamine-either Zyrtec or Claritin once daily. -Continue Flonase nasal spray  RTC in 4 weeks after PFTs.    Current Outpatient Medications:  .  albuterol (VENTOLIN HFA) 108 (90 Base) MCG/ACT inhaler, Inhale 1-2 puffs into the lungs every 6 (six) hours as needed for wheezing or shortness of breath., Disp: , Rfl:  .  aspirin EC 81 MG tablet, Take 81 mg by mouth daily., Disp: , Rfl:  .  benzonatate (TESSALON) 200 MG capsule, Take 1 capsule (200 mg total) by mouth 3 (three) times daily as needed for cough., Disp: 30 capsule, Rfl: 1 .  BRILINTA 90 MG TABS tablet, Take 1 tablet by mouth twice daily, Disp: 180 tablet, Rfl: 3 .  Budeson-Glycopyrrol-Formoterol (BREZTRI AEROSPHERE) 160-9-4.8 MCG/ACT AERO, Inhale 2 puffs into the lungs 2 (two) times daily., Disp: 10.7 g, Rfl: 3 .  diphenhydrAMINE (BENADRYL) 25 mg capsule, Take 50 mg by mouth every 6 (six) hours as needed for itching or allergies. , Disp: , Rfl:  .  furosemide (LASIX) 20 MG tablet, Take 1  tablet (20 mg  total) by mouth daily., Disp: 30 tablet, Rfl: 0 .  leflunomide (ARAVA) 20 MG tablet, Take 20 mg by mouth daily., Disp: , Rfl:  .  montelukast (SINGULAIR) 10 MG tablet, Take 1 tablet (10 mg total) by mouth at bedtime., Disp: 30 tablet, Rfl: 11 .  nitroGLYCERIN (NITROSTAT) 0.4 MG SL tablet, Place 1 tablet (0.4 mg total) under the tongue every 5 (five) minutes x 3 doses as needed for chest pain., Disp: 25 tablet, Rfl: 12 .  potassium chloride (KLOR-CON) 10 MEQ tablet, Take 1 tablet (10 mEq total) by mouth daily., Disp: 30 tablet, Rfl: 0 .  predniSONE (DELTASONE) 5 MG tablet, Take 1 tablet (5 mg total) by mouth daily as needed for up to 10 doses. (Patient taking differently: Take 5 mg by mouth daily as needed (ra/inflammation). ), Disp: 10 tablet, Rfl: 0 .  ranolazine (RANEXA) 1000 MG SR tablet, Take 0.5 tablets of 500 mg twice daily for 2 weeks then increase to 1 tablet twice daily thereafter. (Patient taking differently: Take 1,000 mg by mouth 2 (two) times daily. ), Disp: 180 tablet, Rfl: 3 .  rosuvastatin (CRESTOR) 10 MG tablet, TAKE 1 TABLET AT NIGHT FOR HIGH CHOLESTEROL (Patient taking differently: Take 10 mg by mouth daily. ), Disp: 90 tablet, Rfl: 1 .  varenicline (CHANTIX) 0.5 MG tablet, Take 1 tablet (0.5 mg total) by mouth 2 (two) times daily., Disp: 60 tablet, Rfl: 0    Julian Hy, DO [facility] 11/06/2019 1:24 PM

## 2019-11-06 NOTE — Addendum Note (Signed)
Addended by: Suzzanne Cloud E on: 11/06/2019 02:12 PM   Modules accepted: Orders

## 2019-11-06 NOTE — Patient Instructions (Addendum)
Thank you for visiting Dr. Carlis Abbott at [facility]. We recommend the following: Orders Placed This Encounter  Procedures  . DG Chest 2 View  . Protein, body fluid (other)  . Body fluid cell count with differential  . LD, Body Fluid (other)  . Lactate dehydrogenase  . Protein, total   Orders Placed This Encounter  Procedures  . DG Chest 2 View    Standing Status:   Future    Standing Expiration Date:   11/05/2020    Order Specific Question:   Reason for Exam (SYMPTOM  OR DIAGNOSIS REQUIRED)    Answer:   post-thora    Order Specific Question:   Preferred imaging location?    Answer:   Internal    Order Specific Question:   Radiology Contrast Protocol - do NOT remove file path    Answer:   [email or web address]  . Protein, body fluid (other)    Standing Status:   Future    Standing Expiration Date:   11/05/2020  . Body fluid cell count with differential    Standing Status:   Future    Standing Expiration Date:   11/05/2020  . LD, Body Fluid (other)    Standing Status:   Future    Standing Expiration Date:   11/05/2020  . Lactate dehydrogenase    Standing Status:   Future    Standing Expiration Date:   11/05/2020  . Protein, total    Standing Status:   Future    Standing Expiration Date:   11/05/2020    No orders of the defined types were placed in this encounter.   Return in about 4 weeks (around 12/04/2019).    Please do your part to reduce the spread of COVID-19.

## 2019-11-06 NOTE — Addendum Note (Signed)
Addended by: Suzzanne Cloud E on: 11/06/2019 02:54 PM   Modules accepted: Orders

## 2019-11-07 LAB — LD, BODY FLUID (OTHER): LD, Body Fluid: 506 IU/L

## 2019-11-07 LAB — PROTEIN, BODY FLUID (OTHER): Protein, Fluid: 4.5 g/dL

## 2019-11-07 LAB — PATHOLOGIST SMEAR REVIEW

## 2019-11-07 LAB — LACTATE DEHYDROGENASE: LDH: 138 U/L (ref 120–250)

## 2019-11-07 NOTE — Telephone Encounter (Signed)
I spoke with the pt about results and recommendations  She agreed to proceed with the repeat thora  Dr Carlis Abbott- before I send order will you please advise is this just therapeutic or do you want labs with this as well? Thanks!!

## 2019-11-07 NOTE — Telephone Encounter (Signed)
Ordered as requested.

## 2019-11-07 NOTE — Telephone Encounter (Signed)
Patient is returning Dr. Carlis Abbott phone call. Patient phone number is [phone].

## 2019-11-07 NOTE — Telephone Encounter (Signed)
Yes, I would. Can you order cytology, cell count with diff, resp culture, LDH, protein?  Thanks!

## 2019-11-10 DIAGNOSIS — M150 Primary generalized (osteo)arthritis: Secondary | ICD-10-CM | POA: Diagnosis not present

## 2019-11-10 DIAGNOSIS — M0579 Rheumatoid arthritis with rheumatoid factor of multiple sites without organ or systems involvement: Secondary | ICD-10-CM | POA: Diagnosis not present

## 2019-11-10 DIAGNOSIS — R5382 Chronic fatigue, unspecified: Secondary | ICD-10-CM | POA: Diagnosis not present

## 2019-11-10 DIAGNOSIS — Z6828 Body mass index (BMI) 28.0-28.9, adult: Secondary | ICD-10-CM | POA: Diagnosis not present

## 2019-11-10 DIAGNOSIS — E663 Overweight: Secondary | ICD-10-CM | POA: Diagnosis not present

## 2019-11-10 DIAGNOSIS — M25561 Pain in right knee: Secondary | ICD-10-CM | POA: Diagnosis not present

## 2019-11-11 ENCOUNTER — Ambulatory Visit (HOSPITAL_COMMUNITY): Payer: Medicare Other

## 2019-11-11 ENCOUNTER — Other Ambulatory Visit (HOSPITAL_COMMUNITY)
Admission: RE | Admit: 2019-11-11 | Discharge: 2019-11-11 | Disposition: A | Discharge status: Home / Self Care | Payer: Medicare Other | Source: Ambulatory Visit | Attending: Critical Care Medicine | Admitting: Critical Care Medicine

## 2019-11-11 DIAGNOSIS — Z20822 Contact with and (suspected) exposure to covid-19: Secondary | ICD-10-CM | POA: Diagnosis not present

## 2019-11-11 DIAGNOSIS — Z01812 Encounter for preprocedural laboratory examination: Secondary | ICD-10-CM | POA: Diagnosis not present

## 2019-11-11 LAB — SARS CORONAVIRUS 2 (TAT 6-24 HRS): SARS Coronavirus 2: NEGATIVE

## 2019-11-13 ENCOUNTER — Other Ambulatory Visit: Payer: Self-pay | Admitting: Critical Care Medicine

## 2019-11-13 ENCOUNTER — Ambulatory Visit (HOSPITAL_COMMUNITY)
Admission: RE | Admit: 2019-11-13 | Discharge: 2019-11-13 | Disposition: A | Discharge status: Home / Self Care | Payer: Medicare Other | Source: Ambulatory Visit | Attending: Critical Care Medicine | Admitting: Critical Care Medicine

## 2019-11-13 ENCOUNTER — Other Ambulatory Visit: Payer: Self-pay

## 2019-11-13 DIAGNOSIS — J90 Pleural effusion, not elsewhere classified: Secondary | ICD-10-CM

## 2019-11-13 MED ORDER — LIDOCAINE HCL 1 % IJ SOLN
INTRAMUSCULAR | Status: AC
  Filled 2019-11-13: qty 20

## 2019-11-13 NOTE — Progress Notes (Signed)
Can we have her come in just for a 2-view CXR one day next week to confirm complete resolution of her effusion? Thanks!  LPC

## 2019-11-14 ENCOUNTER — Other Ambulatory Visit: Payer: Self-pay

## 2019-11-14 DIAGNOSIS — J90 Pleural effusion, not elsewhere classified: Secondary | ICD-10-CM

## 2019-11-17 ENCOUNTER — Ambulatory Visit: Payer: Medicare Other | Admitting: Cardiology

## 2019-11-20 ENCOUNTER — Telehealth: Payer: Self-pay | Admitting: Critical Care Medicine

## 2019-11-20 NOTE — Telephone Encounter (Signed)
Not sure what test pt is referring to?  Message states chest x-ray.  I don't see CT order.  Will route to triage to see if they know what test pt is referring to.

## 2019-11-20 NOTE — Telephone Encounter (Signed)
Double checked pt's instructions but Dr. Carlis Abbott only wanted her to have a CXR. She does not need to have an appointment for this, she can come by the office at any time. Spoke with pt. She is aware of this information. Nothing further was needed.

## 2019-11-26 ENCOUNTER — Other Ambulatory Visit: Payer: Self-pay | Admitting: Critical Care Medicine

## 2019-11-26 DIAGNOSIS — R0609 Other forms of dyspnea: Secondary | ICD-10-CM

## 2019-11-26 DIAGNOSIS — R0600 Dyspnea, unspecified: Secondary | ICD-10-CM

## 2019-11-27 ENCOUNTER — Other Ambulatory Visit: Payer: Self-pay

## 2019-11-27 MED ORDER — OZEMPIC (0.25 OR 0.5 MG/DOSE) 2 MG/1.5ML ~~LOC~~ SOPN
1.0000 mg | PEN_INJECTOR | SUBCUTANEOUS | 1 refills | Status: DC
Start: 2019-11-27 — End: 2019-12-01

## 2019-12-01 ENCOUNTER — Other Ambulatory Visit: Payer: Self-pay

## 2019-12-01 MED ORDER — OZEMPIC (0.25 OR 0.5 MG/DOSE) 2 MG/1.5ML ~~LOC~~ SOPN: 1.0000 mg | PEN_INJECTOR | SUBCUTANEOUS | 4 refills | Status: DC

## 2019-12-02 NOTE — Telephone Encounter (Signed)
PCP please  Julian Hy, DO 12/02/19 10:51 AM [facility]

## 2019-12-02 NOTE — Telephone Encounter (Signed)
Dr. Carlis Abbott, please advise if you are okay with [place] refilling pt's med or if PCP needs to handle further refills.

## 2019-12-10 ENCOUNTER — Ambulatory Visit (INDEPENDENT_AMBULATORY_CARE_PROVIDER_SITE_OTHER): Payer: Medicare Other

## 2019-12-10 DIAGNOSIS — J90 Pleural effusion, not elsewhere classified: Secondary | ICD-10-CM | POA: Diagnosis not present

## 2019-12-10 NOTE — Progress Notes (Signed)
Please let Brenda Cochran know that her chest x-ray is stable. If she gets more short of breath we need to reassess to ensure that this is not increasing. I expect that if in the last month if it is not changed, it is unlikely to moving forward. If it enlarges again we would need to repeat another thoracentesis. Thanks! LPC

## 2019-12-16 ENCOUNTER — Other Ambulatory Visit: Payer: Self-pay | Admitting: Critical Care Medicine

## 2019-12-16 ENCOUNTER — Ambulatory Visit (INDEPENDENT_AMBULATORY_CARE_PROVIDER_SITE_OTHER): Payer: Medicare Other | Admitting: Critical Care Medicine

## 2019-12-16 ENCOUNTER — Encounter: Payer: Self-pay | Admitting: Critical Care Medicine

## 2019-12-16 ENCOUNTER — Telehealth: Payer: Self-pay

## 2019-12-16 ENCOUNTER — Other Ambulatory Visit: Payer: Self-pay

## 2019-12-16 VITALS — BP 120/70 | HR 106 | Ht 63.0 in | Wt 174.6 lb

## 2019-12-16 DIAGNOSIS — J449 Chronic obstructive pulmonary disease, unspecified: Secondary | ICD-10-CM

## 2019-12-16 DIAGNOSIS — J90 Pleural effusion, not elsewhere classified: Secondary | ICD-10-CM

## 2019-12-16 DIAGNOSIS — R0609 Other forms of dyspnea: Secondary | ICD-10-CM

## 2019-12-16 DIAGNOSIS — R0600 Dyspnea, unspecified: Secondary | ICD-10-CM | POA: Diagnosis not present

## 2019-12-16 DIAGNOSIS — R0789 Other chest pain: Secondary | ICD-10-CM | POA: Diagnosis not present

## 2019-12-16 MED ORDER — AZITHROMYCIN 250 MG PO TABS
250.0000 mg | ORAL_TABLET | Freq: Every day | ORAL | 0 refills | Status: DC
Start: 2019-12-16 — End: 2019-12-30

## 2019-12-16 MED ORDER — PREDNISONE 10 MG PO TABS: 40.0000 mg | ORAL_TABLET | Freq: Every day | ORAL | 0 refills | Status: DC

## 2019-12-16 NOTE — Progress Notes (Signed)
Synopsis: Referred in April 2021 for COPD, asthma by Rochel Brome, MD.  Subjective:   PATIENT ID: Brenda Cochran GENDER: female DOB: January 09, 1951, MRN: [id number]  Chief Complaint  Patient presents with  . Follow-up    Patient feels like her breathing is worse since last visit. Shortness of breath all the time worse with exertion. Dry cough worse at night when she lays down. Recently started Chantix    Brenda Cochran is a 69 year old woman who presents for follow-up of chronic shortness of breath.  She also complains of right sided pain that radiates to her back.  Both of these symptoms have been present worsening for several months.  She remains on Breztri twice daily, but has worse shortness of breath since her last visit, especially for the past few days.  She feels like she cannot take a deep breath due to the sensation of having broken ribs on her right side.  Her breathing is worse when she is lying down.  She has frequent coughing that limits her ability to sleep, which is also been worsening over the past few weeks.  Albuterol does not help her coughing.  She sleeps propped up on 2 pillows.  She has wheezing occasionally.  The right-sided pain is more anterior and is sometimes associated with an outward bulging.  Her pain is worse with coughing, breathing, and palpation, but is not associated with eating.  For her RA she remains on leflunomide, but ran out of prednisone this was to use as needed for pain.  She was previously on etodolac, but when she was admitted to the hospital for her heart they discontinued this in 2020.  She is down to smoking 0.5 packs/day and recently started taking Chantix.  At her last visit she was walking 1 pack/day, and at her heaviest smoked 4 packs/day.  No side effects from Chantix.  Since her last visit she was referred to radiology for thoracentesis.  They determined the fluid is mostly resolved and did not think that this could be safely completed.  The minimal  amount of fluid sent from our office thoracentesis was lymphocyte- predominant and exudative.   OV 11/06/19: Brenda Cochran is a 69 y/o woman with a history of R-sided CP and COPD who presents for follow up of a pleural effusion and right sided chest pain. She was told in the ED she had acute bronchitis but was not given antibiotics. Her CT scan there demonstrated the partially loculated appearing effusion. Her symptoms are unchanged. She has some DOE, but is well controlled on [place]. She has no started chantix yet because she has a lot going on and doesn't feel ready to quit smoking yet.  She has chronic dysphagia and an occasional sticking sensation in her chest when eating which is gotten worse over several years.  She had a previous barium esophagram in 2015 which demonstrated a small caliber esophagus but no strictures.  She has never been evaluated by GI.    OV 09/11/19: Brenda Cochran is a 69 year old woman who presents for evaluation of chronic cough and dyspnea.  She was diagnosed with COPD during 11-day hospitalization in late 2010.  She subsequently followed up with Dr. Carren Rang who told her it was more asthma than COPD.  She was started on inhalers none.  She last saw Dr. Carren Rang several years ago.  She is unsure if she had PFTs in the past.  She had relatively stable symptoms until about 3 months ago when she had a  persistent cough.  Her cough feels productive, but she is unable to expectorate sputum.  She previously had discolored sputum, which is cleared.  She coughs periodically throughout the day, but it is worse later in the day and overnight.  She has been coughing so much that she has rib soreness.  She is short of breath all the time even when sitting still, but worse with activity.  She has occasional wheezing.  Nothing seems to make her symptoms better or worse.  She has been tested for Covid several times, all negative.  She was started on prednisone by her primary care provider.  She was feeling  worse and went to urgent care in February when she was started on azithromycin, Tessalon, albuterol.  She had mild benefit on azithromycin, but had recurrence of symptoms azithromycin stopped.  Her Symbicort was recently escalated to [facility].  When she is using her inhaler she feels like the medicine stops in her throat and it does not go all the way to her lungs.  She is only used her albuterol about 3 times in the past few months but did not notice a significant benefit. She has a history of chronic sinus allergic symptoms with chronic right-sided sinusitis due to a deviated septum.  She takes Flonase daily.  She has not previously tried an antihistamine.  She continues to smoke 1 pack/day.  She is smoked about 25 years, her heaviest 4 packs/day in the 59s.  She previously quit for 7 years in early 2000's.  She is planning on starting Chantix later this month to help with smoking cessation.  She has a family history notable for lung cancer in an uncle and her brother who has COPD and asthma.   She has a history significant for coronary artery disease and HFpEF.  She follows with Dr. Geraldo Pitter in cardiology, 08/15/2019 note reviewed.  She had a stent placed in 2020.  Recently she has noticed weight gain, abdominal distention, mild leg edema, orthopnea requiring her to sleep on 3 pillows.  She has a 1-2 times per night for the past year.  She is not on a diuretic regimen.  She has occasional chest pain for which she takes nitroglycerin, which is her recent baseline since her heart attack last year.    Past Medical History:  Diagnosis Date  . Asthma   . Atherosclerotic heart disease of native coronary artery without angina pectoris   . Chronic maxillary sinusitis   . COPD (chronic obstructive pulmonary disease) ([place])   . Depression   . Fibromyalgia   . GERD (gastroesophageal reflux disease)   . Headache   . Hyperlipidemia   . Hypertensive heart disease without heart failure   . Hypothyroidism  (acquired)   . Major depressive disorder, single episode, mild ([place])   . Menopausal and female climacteric states   . Nicotine dependence, cigarettes, with unspecified nicotine-induced disorders   . Osteoarthritis   . Overweight   . RA (rheumatoid arthritis) ([place])    [name]  . Skin cancer    face  . Sleep apnea    cpap  . Type 2 diabetes mellitus ([place])   . Type 2 diabetes mellitus with diabetic nephropathy ([place])   . Vitamin D deficiency, unspecified      Family History  Problem Relation Age of Onset  . Breast cancer Sister   . Lymphoma Sister   . Multiple sclerosis Brother   . Muscular dystrophy Brother   . CAD Other   .  Diabetes Other   . Hypertension Other   . Seizures Other   . Lung disease Mother        never smoker  . Lung disease Maternal Uncle        lung cancer  . Lung disease Maternal Uncle   . Lung cancer Father   . Bone cancer Father   . COPD Brother   . Colon cancer Neg Hx   . Colon polyps Neg Hx   . Esophageal cancer Neg Hx   . Stomach cancer Neg Hx   . Rectal cancer Neg Hx      Past Surgical History:  Procedure Laterality Date  . ABDOMINAL HYSTERECTOMY  1984  . BREAST LUMPECTOMY  1976   left, benign  . CHOLECYSTECTOMY  09/05/10  . CORONARY STENT INTERVENTION N/A 07/12/2018   Procedure: CORONARY STENT INTERVENTION;  Surgeon: Jettie Booze, MD;  Location: [place] [facility];  Service: Cardiovascular;  Laterality: N/A;  . CORONARY THROMBECTOMY N/A 07/12/2018   Procedure: Coronary Thrombectomy;  Surgeon: Jettie Booze, MD;  Location: [place] [facility];  Service: Cardiovascular;  Laterality: N/A;  . INTRAVASCULAR PRESSURE WIRE/FFR STUDY N/A 01/21/2019   Procedure: INTRAVASCULAR PRESSURE WIRE/FFR STUDY;  Surgeon: Jettie Booze, MD;  Location: [place] [facility];  Service: Cardiovascular;  Laterality: N/A;  . LEFT HEART CATH AND CORONARY ANGIOGRAPHY N/A 07/12/2018   Procedure: LEFT HEART CATH AND CORONARY ANGIOGRAPHY;  Surgeon:  Jettie Booze, MD;  Location: [place] [facility];  Service: Cardiovascular;  Laterality: N/A;  . LEFT HEART CATH AND CORONARY ANGIOGRAPHY N/A 01/21/2019   Procedure: LEFT HEART CATH AND CORONARY ANGIOGRAPHY;  Surgeon: Jettie Booze, MD;  Location: [place] [facility];  Service: Cardiovascular;  Laterality: N/A;    Social History   Socioeconomic History  . Marital status: Married    Spouse name: Not on file  . Number of children: 6  . Years of education: Not on file  . Highest education level: Not on file  Occupational History  . Occupation: disabled    Comment: jockey  Tobacco Use  . Smoking status: Current Every Day Smoker    Packs/day: 0.50    Years: 30.00    Pack years: 15.00    Types: Cigarettes  . Smokeless tobacco: Never Used  . Tobacco comment: now smoking 1/2 pack a day 11/06/19 [facility]  Vaping Use  . Vaping Use: Never used  Substance and Sexual Activity  . Alcohol use: No  . Drug use: No  . Sexual activity: Not on file  Other Topics Concern  . Not on file  Social History Narrative   Lives with Jalyne Brodzinski husband.   Disabled   Social Determinants of Health   Financial Resource Strain:   . Difficulty of Paying Living Expenses:   Food Insecurity:   . Worried About Charity fundraiser in the Last Year:   . Arboriculturist in the Last Year:   Transportation Needs:   . Film/video editor (Medical):   Marland Kitchen Lack of Transportation (Non-Medical):   Physical Activity:   . Days of Exercise per Week:   . Minutes of Exercise per Session:   Stress:   . Feeling of Stress :   Social Connections:   . Frequency of Communication with Friends and Family:   . Frequency of Social Gatherings with Friends and Family:   . Attends Religious Services:   . Active Member of Clubs or Organizations:   . Attends  Club or Organization Meetings:   Marland Kitchen Marital Status:   Intimate Partner Violence:   . Fear of Current or Ex-Partner:   . Emotionally Abused:   Marland Kitchen Physically Abused:     . Sexually Abused:      Allergies  Allergen Reactions  . Shellfish Allergy Anaphylaxis  . Contrast Media [Iodinated Diagnostic Agents] Rash  . Metformin And Related Nausea And Vomiting  . Amitriptyline Hcl Other (See Comments)    Somnolence  . Cymbalta [Duloxetine Hcl] Nausea And Vomiting  . Ultram [Tramadol] Nausea And Vomiting  . Oxycontin [Oxycodone Hcl] Palpitations     Immunization History  Administered Date(s) Administered  . Influenza, High Dose Seasonal PF 03/01/2018  . Influenza,inj,Quad PF,6-35 Mos 05/06/2019  . Influenza-Unspecified 02/03/2013, 03/05/2014  . PFIZER SARS-COV-2 Vaccination 09/05/2019, 09/29/2019  . Pneumococcal Conjugate-13 04/13/2017  . Pneumococcal Polysaccharide-23 03/01/2018  . Pneumococcal-Unspecified 06/05/2010, 09/03/2016    Outpatient Medications Prior to Visit  Medication Sig Dispense Refill  . albuterol (VENTOLIN HFA) 108 (90 Base) MCG/ACT inhaler Inhale 1-2 puffs into the lungs every 6 (six) hours as needed for wheezing or shortness of breath.    Marland Kitchen aspirin EC 81 MG tablet Take 81 mg by mouth daily.    . benzonatate (TESSALON) 200 MG capsule Take 1 capsule (200 mg total) by mouth 3 (three) times daily as needed for cough. 30 capsule 1  . BRILINTA 90 MG TABS tablet Take 1 tablet by mouth twice daily 180 tablet 3  . Budeson-Glycopyrrol-Formoterol (BREZTRI AEROSPHERE) 160-9-4.8 MCG/ACT AERO Inhale 2 puffs into the lungs 2 (two) times daily. 10.7 g 3  . diphenhydrAMINE (BENADRYL) 25 mg capsule Take 50 mg by mouth every 6 (six) hours as needed for itching or allergies.     . furosemide (LASIX) 20 MG tablet Take 1 tablet (20 mg total) by mouth daily. 30 tablet 0  . leflunomide (ARAVA) 20 MG tablet Take 20 mg by mouth daily.    . montelukast (SINGULAIR) 10 MG tablet Take 1 tablet (10 mg total) by mouth at bedtime. 30 tablet 11  . nitroGLYCERIN (NITROSTAT) 0.4 MG SL tablet Place 1 tablet (0.4 mg total) under the tongue every 5 (five) minutes x 3  doses as needed for chest pain. 25 tablet 12  . potassium chloride (KLOR-CON) 10 MEQ tablet Take 1 tablet (10 mEq total) by mouth daily. 30 tablet 0  . predniSONE (DELTASONE) 5 MG tablet Take 1 tablet (5 mg total) by mouth daily as needed for up to 10 doses. (Patient taking differently: Take 5 mg by mouth daily as needed (ra/inflammation). ) 10 tablet 0  . ranolazine (RANEXA) 1000 MG SR tablet Take 0.5 tablets of 500 mg twice daily for 2 weeks then increase to 1 tablet twice daily thereafter. (Patient taking differently: Take 1,000 mg by mouth 2 (two) times daily. ) 180 tablet 3  . rosuvastatin (CRESTOR) 10 MG tablet TAKE 1 TABLET AT NIGHT FOR HIGH CHOLESTEROL (Patient taking differently: Take 10 mg by mouth daily. ) 90 tablet 1  . Semaglutide,0.25 or 0.5MG /DOS, (OZEMPIC, 0.25 OR 0.5 MG/DOSE,) 2 MG/1.5ML SOPN Inject 0.75 mLs (1 mg total) into the skin once a week. Inhect 0.75 mg sq once weekly on the same day of each week, in the abdomen thighs, or upper arm. Rotating injection sites. 4 pen 4  . varenicline (CHANTIX) 0.5 MG tablet Take 1 tablet (0.5 mg total) by mouth 2 (two) times daily. 60 tablet 0   No facility-administered medications prior to visit.  Review of Systems  Constitutional: Negative for chills, fever and weight loss.       6# weight gain in past week  Respiratory: Positive for cough, sputum production, shortness of breath and wheezing.   Cardiovascular: Positive for orthopnea and leg swelling. Negative for chest pain.  Gastrointestinal: Negative for blood in stool, heartburn, melena, nausea and vomiting.  Genitourinary: Negative for hematuria.       Nocturia  Skin: Negative for rash.  Neurological: Negative for focal weakness.  Endo/Heme/Allergies: Positive for environmental allergies.     Objective:   Vitals:   12/16/19 1000  BP: 120/70  Pulse: (!) 106  SpO2: 99%  Weight: 174 lb 9.6 oz (79.2 kg)  Height: 5\' 3"  (1.6 m)   99% on   RA BMI Readings from Last 3  Encounters:  12/16/19 30.93 kg/m  11/06/19 28.76 kg/m  10/29/19 28.96 kg/m   Wt Readings from Last 3 Encounters:  12/16/19 174 lb 9.6 oz (79.2 kg)  11/06/19 172 lb 12.8 oz (78.4 kg)  10/29/19 174 lb (78.9 kg)    Physical Exam Vitals reviewed.  Constitutional:      General: She is not in acute distress. HENT:     Head: Normocephalic and atraumatic.  Eyes:     General: No scleral icterus. Cardiovascular:     Rate and Rhythm: Normal rate and regular rhythm.     Heart sounds: No murmur heard.   Pulmonary:     Comments: Reduced right base breath sounds, mild inspiratory squeaks and expiratory wheezing. Tachypneic, conversational dyspnea. Abdominal:     Palpations: Abdomen is soft.  Musculoskeletal:        General: No swelling or deformity.     Cervical back: Neck supple.     Comments: Mild TTP anterior lower R ribs. No defects.  Lymphadenopathy:     Cervical: No cervical adenopathy.  Skin:    General: Skin is warm.     Findings: No rash.  Neurological:     General: No focal deficit present.     Mental Status: She is alert.     Coordination: Coordination normal.  Psychiatric:        Mood and Affect: Mood normal.        Behavior: Behavior normal.          CBC    Component Value Date/Time   WBC 8.1 10/30/2019 2047   RBC 4.32 10/30/2019 2047   HGB 13.0 10/30/2019 2047   HGB 14.3 08/15/2019 0745   HCT 40.3 10/30/2019 2047   HCT 43.0 08/15/2019 0745   PLT 351 10/30/2019 2047   PLT 300 08/15/2019 0745   MCV 93.3 10/30/2019 2047   MCV 94 08/15/2019 0745   MCH 30.1 10/30/2019 2047   MCHC 32.3 10/30/2019 2047   RDW 12.3 10/30/2019 2047   RDW 11.9 08/15/2019 0745   LYMPHSABS 1.5 10/29/2019 1258   LYMPHSABS 1.8 08/15/2019 0745   MONOABS 0.9 10/29/2019 1258   EOSABS 0.5 10/29/2019 1258   EOSABS 0.6 (H) 08/15/2019 0745   BASOSABS 0.1 10/29/2019 1258   BASOSABS 0.1 08/15/2019 0745    CHEMISTRY No results for input(s): NA, K, CL, CO2, GLUCOSE, BUN,  CREATININE, CALCIUM, MG, PHOS in the last 168 hours. CrCl cannot be calculated (Patient's most recent lab result is older than the maximum 21 days allowed.).   Chest Imaging- films reviewed: CXR, 1 view 07/12/2018-mildly increased interstitial markings bilaterally.  CT chest 10/30/2019- right loculated pleural effusion with overlying compressive atelectasis.  2 linear  opacities in this region.  Bronchial wall thickening.  CXR 12/10/2019-minimal residual fluid versus pleural thickening on the right.  Mildly diminished compared to 11/06/2019.  Pulmonary Functions Testing Results: PFT Results Latest Ref Rng & Units 09/25/2019  FVC-Pre L 2.25  FVC-Predicted Pre % 69  FVC-Post L 2.27  FVC-Predicted Post % 70  Pre FEV1/FVC % % 77  Post FEV1/FCV % % 78  FEV1-Pre L 1.73  FEV1-Predicted Pre % 70  FEV1-Post L 1.77  DLCO UNC% % 78  DLCO COR %Predicted % 114  TLC L 4.16  TLC % Predicted % 80  RV % Predicted % 102   2021-no significant obstruction or bronchodilator reversibility. No significant restriction.  Mild diffusion impairment.  (used Breztri prior)   Spirometry 01/13/2014: FVC 2.4 (77%) FEV1 1.9 (76%) Ratio 77%   Echocardiogram 07/12/2018: EF 55 to 81%, diastolic dysfunction is present.  Normal LA, RV, RA.  Normal valves.    Heart Catheterization 01/21/2019:   Ost LAD lesion is 40% stenosed.  Previously placed Mid LAD drug eluting stent is widely patentwidely patent.  Ost 1st Mrg lesion is 60% stenosed.  Dist Cx lesion is 25% stenosed.  Prox RCA lesion is 25% stenosed.  The left ventricular systolic function is normal.  LV end diastolic pressure is normal.  The left ventricular ejection fraction is 55-65% by visual estimate.  There is no aortic valve stenosis.   LAD disease moderate at ostium but not significant by DFR.  Some moderate small branch vessel disease present.  Continue medical therapy and aggressive secondary prevention.      Assessment & Plan:     ICD-10-CM    1. Atypical chest pain  R07.89   2. Pleural effusion, not elsewhere classified  J90 CT Chest W Contrast  3. Chronic obstructive pulmonary disease, unspecified COPD type ([place])  J44.9   4. DOE (dyspnea on exertion)  R06.00    Partially loculated lymphocyte predominant pleural effusion associated with chest pain, concern for RA associated pleurisy.  -Repeat chest CT scan with contrast. BMP today. -Depending on response to prednisone and antibiotics, consider increasing to full dose aspirin if pain goes away and recurs  Chronic dyspnea on exertion with smoking-related obstructive lung disease- acutely exacerbated -Recommend for smoking cessation- she continues to make progress -azithromycin and prednisone x 5 days. -Continue Breztri BID.  Continue to use albuterol 5 minutes prior to help with breath hold. -Continue albuterol every 4 hours as needed for rescue inhaler. -Up-to-date on Covid, pneumococcal, flu vaccines  Tobacco abuse -Recommend complete smoking cessation -Continue Chantix- no side effects currently. We have previously discussed side effects to watch out for. -Need to discuss lung cancer screening in 1 year.  Not discussed today: Chronic history of asthma, allergic rhinosinusitis -Start oral antihistamine-either Zyrtec or Claritin once daily. -Continue Flonase nasal spray  RTC in 2 weeks.    Current Outpatient Medications:  .  albuterol (VENTOLIN HFA) 108 (90 Base) MCG/ACT inhaler, Inhale 1-2 puffs into the lungs every 6 (six) hours as needed for wheezing or shortness of breath., Disp: , Rfl:  .  aspirin EC 81 MG tablet, Take 81 mg by mouth daily., Disp: , Rfl:  .  benzonatate (TESSALON) 200 MG capsule, Take 1 capsule (200 mg total) by mouth 3 (three) times daily as needed for cough., Disp: 30 capsule, Rfl: 1 .  BRILINTA 90 MG TABS tablet, Take 1 tablet by mouth twice daily, Disp: 180 tablet, Rfl: 3 .  Budeson-Glycopyrrol-Formoterol (BREZTRI AEROSPHERE) 160-9-4.8 MCG/ACT  AERO, Inhale 2 puffs into the lungs 2 (two) times daily., Disp: 10.7 g, Rfl: 3 .  diphenhydrAMINE (BENADRYL) 25 mg capsule, Take 50 mg by mouth every 6 (six) hours as needed for itching or allergies. , Disp: , Rfl:  .  furosemide (LASIX) 20 MG tablet, Take 1 tablet (20 mg total) by mouth daily., Disp: 30 tablet, Rfl: 0 .  leflunomide (ARAVA) 20 MG tablet, Take 20 mg by mouth daily., Disp: , Rfl:  .  montelukast (SINGULAIR) 10 MG tablet, Take 1 tablet (10 mg total) by mouth at bedtime., Disp: 30 tablet, Rfl: 11 .  nitroGLYCERIN (NITROSTAT) 0.4 MG SL tablet, Place 1 tablet (0.4 mg total) under the tongue every 5 (five) minutes x 3 doses as needed for chest pain., Disp: 25 tablet, Rfl: 12 .  potassium chloride (KLOR-CON) 10 MEQ tablet, Take 1 tablet (10 mEq total) by mouth daily., Disp: 30 tablet, Rfl: 0 .  predniSONE (DELTASONE) 5 MG tablet, Take 1 tablet (5 mg total) by mouth daily as needed for up to 10 doses. (Patient taking differently: Take 5 mg by mouth daily as needed (ra/inflammation). ), Disp: 10 tablet, Rfl: 0 .  ranolazine (RANEXA) 1000 MG SR tablet, Take 0.5 tablets of 500 mg twice daily for 2 weeks then increase to 1 tablet twice daily thereafter. (Patient taking differently: Take 1,000 mg by mouth 2 (two) times daily. ), Disp: 180 tablet, Rfl: 3 .  rosuvastatin (CRESTOR) 10 MG tablet, TAKE 1 TABLET AT NIGHT FOR HIGH CHOLESTEROL (Patient taking differently: Take 10 mg by mouth daily. ), Disp: 90 tablet, Rfl: 1 .  Semaglutide,0.25 or 0.5MG /DOS, (OZEMPIC, 0.25 OR 0.5 MG/DOSE,) 2 MG/1.5ML SOPN, Inject 0.75 mLs (1 mg total) into the skin once a week. Inhect 0.75 mg sq once weekly on the same day of each week, in the abdomen thighs, or upper arm. Rotating injection sites., Disp: 4 pen, Rfl: 4 .  varenicline (CHANTIX) 0.5 MG tablet, Take 1 tablet (0.5 mg total) by mouth 2 (two) times daily., Disp: 60 tablet, Rfl: 0 .  azithromycin (ZITHROMAX) 250 MG tablet, Take 1 tablet (250 mg total) by mouth  daily., Disp: 6 tablet, Rfl: 0 .  predniSONE (DELTASONE) 10 MG tablet, Take 4 tablets (40 mg total) by mouth daily with breakfast., Disp: 20 tablet, Rfl: 0    Julian Hy, DO [facility] 12/16/2019 1:46 PM

## 2019-12-16 NOTE — Patient Instructions (Addendum)
Thank you for visiting Dr. Carlis Abbott at [facility]. We recommend the following: Orders Placed This Encounter  Procedures  . CT Chest W Contrast   Orders Placed This Encounter  Procedures  . CT Chest W Contrast    First available    Standing Status:   Future    Standing Expiration Date:   12/15/2020    Order Specific Question:   ** REASON FOR EXAM (FREE TEXT)    Answer:   pleural effusion, lower right sided lateral and anterior chest pain,    Order Specific Question:   If indicated for the ordered procedure, I authorize the administration of contrast media per Radiology protocol    Answer:   Yes    Order Specific Question:   Preferred imaging location?    Answer:   [facility]    Order Specific Question:   Radiology Contrast Protocol - do NOT remove file path    Answer:   [email or web address]    Meds ordered this encounter  Medications  . azithromycin (ZITHROMAX) 250 MG tablet    Sig: Take 1 tablet (250 mg total) by mouth daily.    Dispense:  6 tablet    Refill:  0  . predniSONE (DELTASONE) 10 MG tablet    Sig: Take 4 tablets (40 mg total) by mouth daily with breakfast.    Dispense:  20 tablet    Refill:  0    Return in about 2 weeks (around 12/30/2019). after CT scan.    Please do your part to reduce the spread of COVID-19.

## 2019-12-16 NOTE — Telephone Encounter (Signed)
Verified with Dr. Carlis Abbott via secure message that zpak should be 2 tablets first day followed by 1 daily until finished.  [facility] is aware and voiced their understanding.  Nothing further is needed at this time.

## 2019-12-16 NOTE — Telephone Encounter (Signed)
13-hour prep called in to [facility] and reviewed with patient.  Prednisone 50mg  PO 12/29/19 @ 2350, 7/27 @ 0550 and 1150.  Benadryl 50mg  PO 12/30/19 @ 1150.

## 2019-12-18 ENCOUNTER — Telehealth: Payer: Self-pay | Admitting: Critical Care Medicine

## 2019-12-18 ENCOUNTER — Other Ambulatory Visit: Payer: Self-pay

## 2019-12-18 MED ORDER — METOPROLOL TARTRATE 25 MG PO TABS: 12.5000 mg | ORAL_TABLET | Freq: Two times a day (BID) | ORAL | 2 refills | Status: DC

## 2019-12-18 MED ORDER — ROSUVASTATIN CALCIUM 10 MG PO TABS: ORAL_TABLET | ORAL | 2 refills | Status: DC

## 2019-12-18 NOTE — Telephone Encounter (Signed)
Spoke with pt. Advised her that CT will show lung nodules. Nothing further was needed.

## 2019-12-23 DIAGNOSIS — Z20828 Contact with and (suspected) exposure to other viral communicable diseases: Secondary | ICD-10-CM | POA: Diagnosis not present

## 2019-12-23 DIAGNOSIS — J069 Acute upper respiratory infection, unspecified: Secondary | ICD-10-CM | POA: Diagnosis not present

## 2019-12-24 ENCOUNTER — Telehealth: Payer: Self-pay | Admitting: Critical Care Medicine

## 2019-12-24 NOTE — Telephone Encounter (Signed)
Called and left message for patient to call back.

## 2019-12-24 NOTE — Telephone Encounter (Signed)
Spoke with pt, aware of Beth's recs.  Nothing further needed at this time- will close encounter.

## 2019-12-24 NOTE — Telephone Encounter (Signed)
I am not able to see CXR for urgent care, it is not in epic or care everywhere. No mass on CXR from 12/10/19, she did have small pleural effusion that has decreased in size. Would get CT chest that is scheduled for 7/27 and follow up with Aaron Edelman as scheduled

## 2019-12-24 NOTE — Telephone Encounter (Signed)
Pt went to urgent care in [place] yesterday She had a cxr and a covid swab which was negative. Pt says she was told cxr showed either a mass/ or possible pneumonia. Dr. Carlis Abbott gave pt a zpak and Prednisone on last week. She currently c/o cough with clear mucus and back pain. She does not have a fever. She had cxr here on 7/7 and is scheduled for a CT on 7/27 and the f/u with Aaron Edelman. Please advise on what she needs to do meanwhile.  CLINICAL DATA:  Pleural effusion  EXAM: CHEST - 2 VIEW  COMPARISON:  12/03/2019, CT 10/30/2019  FINDINGS: Small right pleural effusion continues to decrease since prior examination. The lungs are clear. No pneumothorax. No pleural effusion on the left. Cardiac size within normal limits. Osseous structures are age-appropriate. Pulmonary vascularity is normal.  IMPRESSION: Progressive decrease in small right pleural effusion.   Electronically Signed   By: Fidela Salisbury MD   On: 12/10/2019 08:55

## 2019-12-30 ENCOUNTER — Other Ambulatory Visit: Payer: Self-pay

## 2019-12-30 ENCOUNTER — Encounter: Payer: Self-pay | Admitting: Pulmonary Disease

## 2019-12-30 ENCOUNTER — Ambulatory Visit
Admission: RE | Admit: 2019-12-30 | Discharge: 2019-12-30 | Disposition: A | Discharge status: Home / Self Care | Payer: Medicare Other | Source: Ambulatory Visit | Attending: Critical Care Medicine | Admitting: Critical Care Medicine

## 2019-12-30 ENCOUNTER — Ambulatory Visit (INDEPENDENT_AMBULATORY_CARE_PROVIDER_SITE_OTHER): Payer: Medicare Other | Admitting: Pulmonary Disease

## 2019-12-30 VITALS — BP 124/72 | HR 95 | Ht 65.0 in | Wt 178.0 lb

## 2019-12-30 DIAGNOSIS — J9811 Atelectasis: Secondary | ICD-10-CM | POA: Diagnosis not present

## 2019-12-30 DIAGNOSIS — R0602 Shortness of breath: Secondary | ICD-10-CM | POA: Diagnosis not present

## 2019-12-30 DIAGNOSIS — M47814 Spondylosis without myelopathy or radiculopathy, thoracic region: Secondary | ICD-10-CM | POA: Diagnosis not present

## 2019-12-30 DIAGNOSIS — I2510 Atherosclerotic heart disease of native coronary artery without angina pectoris: Secondary | ICD-10-CM | POA: Diagnosis not present

## 2019-12-30 DIAGNOSIS — J45901 Unspecified asthma with (acute) exacerbation: Secondary | ICD-10-CM

## 2019-12-30 DIAGNOSIS — M05749 Rheumatoid arthritis with rheumatoid factor of unspecified hand without organ or systems involvement: Secondary | ICD-10-CM

## 2019-12-30 DIAGNOSIS — F17210 Nicotine dependence, cigarettes, uncomplicated: Secondary | ICD-10-CM

## 2019-12-30 DIAGNOSIS — J441 Chronic obstructive pulmonary disease with (acute) exacerbation: Secondary | ICD-10-CM

## 2019-12-30 DIAGNOSIS — J90 Pleural effusion, not elsewhere classified: Secondary | ICD-10-CM

## 2019-12-30 DIAGNOSIS — I700 Atherosclerosis of aorta: Secondary | ICD-10-CM | POA: Diagnosis not present

## 2019-12-30 HISTORY — DX: Pleural effusion, not elsewhere classified: J90

## 2019-12-30 MED ORDER — IOPAMIDOL (ISOVUE-300) INJECTION 61%
75.0000 mL | Freq: Once | INTRAVENOUS | Status: AC | PRN
  Administered 2019-12-30: 75 mL via INTRAVENOUS

## 2019-12-30 NOTE — Assessment & Plan Note (Signed)
We'll further evaluate once we have received CT imaging results

## 2019-12-30 NOTE — Patient Instructions (Addendum)
You were seen today by Lauraine Rinne, NP  for:   1. Asthma, chronic obstructive, with acute exacerbation ([place]) 2. Shortness of breath 3. Pleural effusion, not elsewhere classified 4. Loculated pleural effusion  We will follow up with you after we obtain your formal CT results from radiology  Breztri >>> 2 puffs in the morning right when you wake up, rinse out your mouth after use, 12 hours later 2 puffs, rinse after use >>> Take this daily, no matter what >>> This is not a rescue inhaler    5. Rheumatoid arthritis involving hand with positive rheumatoid factor, unspecified laterality ([place])  Keep follow-up with rheumatology  6. Continuous dependence on cigarette smoking  We recommend that you stop smoking.  >>>You need to set a quit date >>>If you have friends or family who smoke, let them know you are trying to quit and not to smoke around you or in your living environment  Smoking Cessation Resources:  1 Anton  >>> Patient to call this resource and utilize it to help support her quit smoking >>> Keep up your hard work with stopping smoking  You can also contact the [facility] >>>For smoking cessation classes call [phone]  We do not recommend using e-cigarettes as a form of stopping smoking  You can sign up for smoking cessation support texts and information:  >>>https://smokefree.gov/smokefreetxt     We recommend today:  No orders of the defined types were placed in this encounter.  No orders of the defined types were placed in this encounter.  No orders of the defined types were placed in this encounter.   Follow Up:    Return in about 2 months (around 03/01/2020), or if symptoms worsen or fail to improve, for Follow up with Dr. Carlis Abbott.   Please do your part to reduce the spread of COVID-19:      Reduce your risk of any infection  and COVID19 by using the similar precautions used for avoiding the common cold or flu:  Marland Kitchen Wash your  hands often with soap and warm water for at least 20 seconds.  If soap and water are not readily available, use an alcohol-based hand sanitizer with at least 60% alcohol.  . If coughing or sneezing, cover your mouth and nose by coughing or sneezing into the elbow areas of your shirt or coat, into a tissue or into your sleeve (not your hands). Langley Gauss A MASK when in public  . Avoid shaking hands with others and consider head nods or verbal greetings only. . Avoid touching your eyes, nose, or mouth with unwashed hands.  . Avoid close contact with people who are sick. . Avoid places or events with large numbers of people in one location, like concerts or sporting events. . If you have some symptoms but not all symptoms, continue to monitor at home and seek medical attention if your symptoms worsen. . If you are having a medical emergency, call 911.   [place] / e-Visit: [email or web address]         [facility]: [facility]: [phone]                   [facility]: [phone]     It is flu season:   >>> Best ways to protect herself from the flu: Receive the yearly flu vaccine, practice good hand hygiene washing with soap and  also using hand sanitizer when available, eat a nutritious meals, get adequate rest, hydrate appropriately   Please contact the office if your symptoms worsen or you have concerns that you are not improving.   Thank you for choosing [facility] for your healthcare, and for allowing [place] to partner with you on your healthcare journey. I am thankful to be able to provide care to you today.   Wyn Quaker FNP-C

## 2019-12-30 NOTE — Assessment & Plan Note (Signed)
Plan:  Continue Breztri  We'll follow up with patient once we have received CT imaging results 6-week follow-up with our office Emphasized need to stop smoking

## 2019-12-30 NOTE — Assessment & Plan Note (Signed)
Plan: We'll further evaluate once we have received CT imaging results

## 2019-12-30 NOTE — Assessment & Plan Note (Signed)
Plan: Continue follow-up with rheumatology Continue Jolee Ewing

## 2019-12-30 NOTE — Assessment & Plan Note (Signed)
Plan: Emphasized need to stop smoking 

## 2019-12-30 NOTE — Assessment & Plan Note (Signed)
Acute on chronic issue Patient continues to smoke Likely asthma COPD overlap syndrome Also dealing with recurrent pleural effusions Pending CT chest from today  Plan: We'll follow up with the patient once we have received formal read from radiology regarding CT chest results

## 2019-12-30 NOTE — Progress Notes (Signed)
@Patient  ID: Brenda Cochran, female    DOB: 07-17-1950, 69 y.o.   MRN: [id number]  Chief Complaint  Patient presents with  . Follow-up    Pt states that her breathing has become worse since last visit. Pt is here to go over results of the CT she had done based off of her recent cxr. Pt has complaints of cough with occ clear phlegm, wheezing, and chest tightness.    Referring provider: Jenean Lindau, MD  HPI:  69 year old female current everyday smoker followed in our office for asthma, cough as well as obstructive sleep apnea  PMH: Osteoarthritis, rheumatoid arthritis, GERD, depression, history non-STEMI, history of hypertension Smoker/ Smoking History: Current smoker  maintenance:  Breztri  Pt of: Dr. Carlis Abbott  12/30/2019  - Visit   69 year old female current everyday smoker followed in our office for asthma, cough as well as obstructive sleep apnea.  She is followed by Dr. Carlis Abbott.  Patient completing follow-up with our office after completing a CT of her chest.  Patient was last seen in our office on 12/16/2019.  At that office visit she was treated with a Z-Pak as well as a course of prednisone.  And a CT of her chest was ordered.  Patient contacted our office on 12/24/2019.  Reporting that she was seen in urgent care [place] on 12/23/2019.  An chest x-ray showed either mass or possible pneumonia.  Patient has completed the CT of her chest with contrast.  This has not been formally read by radiology yet.  Patient continues to smoke. She presents today with her daughter who also continues to smoke. She reports adherence to [place].   Patient has upcoming follow-up with cardiology next week.  Questionaires / Pulmonary Flowsheets:   ACT:  Asthma Control Test ACT Total Score  10/07/2019 13    MMRC: mMRC Dyspnea Scale mMRC Score  10/29/2019 2    Epworth:  No flowsheet data found.  Tests:   2021 PFT-no significant obstruction or bronchodilator reversibility. No significant  restriction.  Mild diffusion impairment.  (used Breztri prior)  Spirometry 01/13/2014: FVC 2.4 (77%) FEV1 1.9 (76%) Ratio 77%   Echocardiogram 07/12/2018: EF 55 to 76%, diastolic dysfunction is present.  Normal LA, RV, RA.  Normal valves.    Heart Catheterization 01/21/2019:   Ost LAD lesion is 40% stenosed.  Previously placed Mid LAD drug eluting stent is widely patentwidely patent.  Ost 1st Mrg lesion is 60% stenosed.  Dist Cx lesion is 25% stenosed.  Prox RCA lesion is 25% stenosed.  The left ventricular systolic function is normal.  LV end diastolic pressure is normal.  The left ventricular ejection fraction is 55-65% by visual estimate.  There is no aortic valve stenosis.   LAD disease moderate at ostium but not significant by DFR.  Some moderate small branch vessel disease present.  Continue medical therapy and aggressive secondary prevention.        FENO:  No results found for: NITRICOXIDE  PFT: PFT Results Latest Ref Rng & Units 09/25/2019  FVC-Pre L 2.25  FVC-Predicted Pre % 69  FVC-Post L 2.27  FVC-Predicted Post % 70  Pre FEV1/FVC % % 77  Post FEV1/FCV % % 78  FEV1-Pre L 1.73  FEV1-Predicted Pre % 70  FEV1-Post L 1.77  DLCO UNC% % 78  DLCO COR %Predicted % 114  TLC L 4.16  TLC % Predicted % 80  RV % Predicted % 102    WALK:  SIX MIN WALK 10/29/2019 01/13/2014  Supplimental Oxygen during Test? (L/min) No No  Tech Comments: Patient was able to complete 1 lap at a slow pace. She had to stop twice during walk to catch her breath. No O2 needed during or after walk. Requested to stop after 1 1/2 laps due to sob.    Imaging: DG Chest 2 View  Result Date: 12/10/2019 CLINICAL DATA:  Pleural effusion EXAM: CHEST - 2 VIEW COMPARISON:  12/03/2019, CT 10/30/2019 FINDINGS: Small right pleural effusion continues to decrease since prior examination. The lungs are clear. No pneumothorax. No pleural effusion on the left. Cardiac size within normal limits. Osseous  structures are age-appropriate. Pulmonary vascularity is normal. IMPRESSION: Progressive decrease in small right pleural effusion. Electronically Signed   By: Fidela Salisbury MD   On: 12/10/2019 08:55    Lab Results:  CBC    Component Value Date/Time   WBC 8.1 10/30/2019 2047   RBC 4.32 10/30/2019 2047   HGB 13.0 10/30/2019 2047   HGB 14.3 08/15/2019 0745   HCT 40.3 10/30/2019 2047   HCT 43.0 08/15/2019 0745   PLT 351 10/30/2019 2047   PLT 300 08/15/2019 0745   MCV 93.3 10/30/2019 2047   MCV 94 08/15/2019 0745   MCH 30.1 10/30/2019 2047   MCHC 32.3 10/30/2019 2047   RDW 12.3 10/30/2019 2047   RDW 11.9 08/15/2019 0745   LYMPHSABS 1.5 10/29/2019 1258   LYMPHSABS 1.8 08/15/2019 0745   MONOABS 0.9 10/29/2019 1258   EOSABS 0.5 10/29/2019 1258   EOSABS 0.6 (H) 08/15/2019 0745   BASOSABS 0.1 10/29/2019 1258   BASOSABS 0.1 08/15/2019 0745    BMET    Component Value Date/Time   NA 136 11/06/2019 1301   NA 142 08/15/2019 0745   K 3.5 11/06/2019 1301   CL 99 11/06/2019 1301   CO2 28 11/06/2019 1301   GLUCOSE 158 (H) 11/06/2019 1301   BUN 9 11/06/2019 1301   BUN 10 08/15/2019 0745   CREATININE 0.65 11/06/2019 1301   CALCIUM 9.6 11/06/2019 1301   GFRNONAA >60 10/30/2019 2047   GFRAA >60 10/30/2019 2047    BNP No results found for: BNP  ProBNP    Component Value Date/Time   PROBNP 75 10/29/2019 1258    Specialty Problems      Pulmonary Problems   Asthma, chronic obstructive, with acute exacerbation ([place])    [name] 01/13/2014  FEV1 76% predicted FVC 77% predicted FEV1 to FVC ratio 100% predicted FEF 25 7569% predicted CXR 01/13/14: NAD Alpha one antitrypsin 156  IgE 56 sl elevated.  RAST neg       OSA (obstructive sleep apnea)    Sleep study 02/19/14 : AHI 5.5.  Mild sleep apnea.  Lowest SpO2 81%      Cough   Shortness of breath   Loculated pleural effusion   Pleural effusion, not elsewhere classified      Allergies  Allergen Reactions  . Shellfish Allergy  Anaphylaxis  . Contrast Media [Iodinated Diagnostic Agents] Rash  . Metformin And Related Nausea And Vomiting  . Amitriptyline Hcl Other (See Comments)    Somnolence  . Cymbalta [Duloxetine Hcl] Nausea And Vomiting  . Ultram [Tramadol] Nausea And Vomiting  . Oxycontin [Oxycodone Hcl] Palpitations    Immunization History  Administered Date(s) Administered  . Influenza, High Dose Seasonal PF 03/01/2018  . Influenza,inj,Quad PF,6-35 Mos 05/06/2019  . Influenza-Unspecified 02/03/2013, 03/05/2014  . PFIZER SARS-COV-2 Vaccination 09/05/2019, 09/29/2019  . Pneumococcal Conjugate-13 04/13/2017  . Pneumococcal Polysaccharide-23 03/01/2018  . Pneumococcal-Unspecified  06/05/2010, 09/03/2016    Past Medical History:  Diagnosis Date  . Asthma   . Atherosclerotic heart disease of native coronary artery without angina pectoris   . Chronic maxillary sinusitis   . COPD (chronic obstructive pulmonary disease) ([place])   . Depression   . Fibromyalgia   . GERD (gastroesophageal reflux disease)   . Headache   . Hyperlipidemia   . Hypertensive heart disease without heart failure   . Hypothyroidism (acquired)   . Major depressive disorder, single episode, mild ([place])   . Menopausal and female climacteric states   . Nicotine dependence, cigarettes, with unspecified nicotine-induced disorders   . Osteoarthritis   . Overweight   . RA (rheumatoid arthritis) ([place])    [name]  . Skin cancer    face  . Sleep apnea    cpap  . Type 2 diabetes mellitus ([place])   . Type 2 diabetes mellitus with diabetic nephropathy ([place])   . Vitamin D deficiency, unspecified     Tobacco History: Social History   Tobacco Use  Smoking Status Current Every Day Smoker  . Packs/day: 4.00  . Years: 30.00  . Pack years: 120.00  . Types: Cigarettes  Smokeless Tobacco Never Used  Tobacco Comment   now smoking 1/2 pack a day 7/27 ep   Ready to quit: Not Answered Counseling given: Not Answered Comment: now  smoking 1/2 pack a day 7/27 ep   Continue to not smoke  Outpatient Encounter Medications as of 12/30/2019  Medication Sig  . albuterol (VENTOLIN HFA) 108 (90 Base) MCG/ACT inhaler Inhale 1-2 puffs into the lungs every 6 (six) hours as needed for wheezing or shortness of breath.  Marland Kitchen aspirin EC 81 MG tablet Take 81 mg by mouth daily.  . benzonatate (TESSALON) 200 MG capsule Take 1 capsule (200 mg total) by mouth 3 (three) times daily as needed for cough.  . BRILINTA 90 MG TABS tablet Take 1 tablet by mouth twice daily  . Budeson-Glycopyrrol-Formoterol (BREZTRI AEROSPHERE) 160-9-4.8 MCG/ACT AERO Inhale 2 puffs into the lungs 2 (two) times daily.  Marland Kitchen leflunomide (ARAVA) 20 MG tablet Take 20 mg by mouth daily.  . montelukast (SINGULAIR) 10 MG tablet Take 1 tablet (10 mg total) by mouth at bedtime.  . nitroGLYCERIN (NITROSTAT) 0.4 MG SL tablet Place 1 tablet (0.4 mg total) under the tongue every 5 (five) minutes x 3 doses as needed for chest pain.  . ranolazine (RANEXA) 1000 MG SR tablet Take 0.5 tablets of 500 mg twice daily for 2 weeks then increase to 1 tablet twice daily thereafter. (Patient taking differently: Take 1,000 mg by mouth 2 (two) times daily. )  . rosuvastatin (CRESTOR) 10 MG tablet TAKE 1 TABLET AT NIGHT FOR HIGH CHOLESTEROL  . Semaglutide,0.25 or 0.5MG /DOS, (OZEMPIC, 0.25 OR 0.5 MG/DOSE,) 2 MG/1.5ML SOPN Inject 0.75 mLs (1 mg total) into the skin once a week. Inhect 0.75 mg sq once weekly on the same day of each week, in the abdomen thighs, or upper arm. Rotating injection sites.  . varenicline (CHANTIX) 0.5 MG tablet Take 1 tablet (0.5 mg total) by mouth 2 (two) times daily.  . metoprolol tartrate (LOPRESSOR) 25 MG tablet Take 0.5 tablets (12.5 mg total) by mouth 2 (two) times daily. (Patient not taking: Reported on 12/30/2019)  . predniSONE (DELTASONE) 5 MG tablet Take 1 tablet (5 mg total) by mouth daily as needed for up to 10 doses. (Patient not taking: Reported on 12/30/2019)  .  [DISCONTINUED] azithromycin (ZITHROMAX) 250 MG tablet  Take 1 tablet (250 mg total) by mouth daily.  . [DISCONTINUED] diphenhydrAMINE (BENADRYL) 25 mg capsule Take 50 mg by mouth every 6 (six) hours as needed for itching or allergies.   . [DISCONTINUED] furosemide (LASIX) 20 MG tablet Take 1 tablet (20 mg total) by mouth daily.  . [DISCONTINUED] potassium chloride (KLOR-CON) 10 MEQ tablet Take 1 tablet (10 mEq total) by mouth daily.  . [DISCONTINUED] predniSONE (DELTASONE) 10 MG tablet Take 4 tablets (40 mg total) by mouth daily with breakfast.   No facility-administered encounter medications on file as of 12/30/2019.     Review of Systems  Review of Systems  Constitutional: Positive for fatigue. Negative for activity change and fever.  HENT: Negative for sinus pressure, sinus pain and sore throat.   Respiratory: Positive for cough, shortness of breath and wheezing.        +pleuritic pain  Cardiovascular: Negative for chest pain and palpitations.  Gastrointestinal: Negative for diarrhea, nausea and vomiting.  Musculoskeletal: Negative for arthralgias.  Neurological: Negative for dizziness.  Psychiatric/Behavioral: Negative for sleep disturbance. The patient is not nervous/anxious.      Physical Exam  BP 124/72 (BP Location: Left Arm, Cuff Size: Large)   Pulse 95   Ht 5\' 5"  (1.651 m)   Wt 178 lb (80.7 kg)   SpO2 98%   BMI 29.62 kg/m   Wt Readings from Last 5 Encounters:  12/30/19 178 lb (80.7 kg)  12/16/19 174 lb 9.6 oz (79.2 kg)  11/06/19 172 lb 12.8 oz (78.4 kg)  10/29/19 174 lb (78.9 kg)  10/07/19 175 lb 3.2 oz (79.5 kg)    BMI Readings from Last 5 Encounters:  12/30/19 29.62 kg/m  12/16/19 30.93 kg/m  11/06/19 28.76 kg/m  10/29/19 28.96 kg/m  10/07/19 29.15 kg/m     Physical Exam Vitals and nursing note reviewed.  Constitutional:      General: She is not in acute distress.    Appearance: Normal appearance. She is obese.     Comments: Chronically ill adult  female  HENT:     Head: Normocephalic and atraumatic.     Right Ear: External ear normal.     Left Ear: External ear normal.     Nose: Nose normal. No congestion.     Mouth/Throat:     Mouth: Mucous membranes are moist.     Pharynx: Oropharynx is clear.  Eyes:     Pupils: Pupils are equal, round, and reactive to light.  Cardiovascular:     Rate and Rhythm: Normal rate and regular rhythm.     Pulses: Normal pulses.     Heart sounds: Normal heart sounds. No murmur heard.   Pulmonary:     Effort: Pulmonary effort is normal. No respiratory distress.     Breath sounds: No decreased air movement. No wheezing or rales.    Musculoskeletal:     Cervical back: Normal range of motion.  Skin:    General: Skin is warm and dry.     Capillary Refill: Capillary refill takes less than 2 seconds.  Neurological:     General: No focal deficit present.     Mental Status: She is alert and oriented to person, place, and time. Mental status is at baseline.     Gait: Gait normal.  Psychiatric:        Mood and Affect: Mood normal. Affect is flat.        Behavior: Behavior is slowed. Behavior is cooperative.  Thought Content: Thought content normal.        Judgment: Judgment normal.     Comments: Somnolent, patient had to take Benadryl prior to CT with contrast       Assessment & Plan:   Asthma, chronic obstructive, with acute exacerbation Plan:  Continue Breztri  We'll follow up with patient once we have received CT imaging results 6-week follow-up with our office Emphasized need to stop smoking  Loculated pleural effusion We'll further evaluate once we have received CT imaging results  Pleural effusion, not elsewhere classified Plan: We'll further evaluate once we have received CT imaging results  RA (rheumatoid arthritis) ([place]) Plan: Continue follow-up with rheumatology Continue Arava  Continuous dependence on cigarette smoking Plan: Emphasized need to stop  smoking  Shortness of breath Acute on chronic issue Patient continues to smoke Likely asthma COPD overlap syndrome Also dealing with recurrent pleural effusions Pending CT chest from today  Plan: We'll follow up with the patient once we have received formal read from radiology regarding CT chest results    Return in about 2 months (around 03/01/2020), or if symptoms worsen or fail to improve, for Follow up with Dr. Carlis Abbott.   Lauraine Rinne, NP 12/30/2019   This appointment required 31 minutes of patient care (this includes precharting, chart review, review of results, face-to-face care, etc.).

## 2019-12-31 ENCOUNTER — Telehealth: Payer: Self-pay | Admitting: Pulmonary Disease

## 2019-12-31 DIAGNOSIS — E663 Overweight: Secondary | ICD-10-CM | POA: Insufficient documentation

## 2019-12-31 DIAGNOSIS — F320 Major depressive disorder, single episode, mild: Secondary | ICD-10-CM | POA: Insufficient documentation

## 2019-12-31 DIAGNOSIS — J449 Chronic obstructive pulmonary disease, unspecified: Secondary | ICD-10-CM | POA: Insufficient documentation

## 2019-12-31 DIAGNOSIS — N951 Menopausal and female climacteric states: Secondary | ICD-10-CM | POA: Insufficient documentation

## 2019-12-31 DIAGNOSIS — J45909 Unspecified asthma, uncomplicated: Secondary | ICD-10-CM | POA: Insufficient documentation

## 2019-12-31 DIAGNOSIS — E119 Type 2 diabetes mellitus without complications: Secondary | ICD-10-CM | POA: Insufficient documentation

## 2019-12-31 DIAGNOSIS — I2510 Atherosclerotic heart disease of native coronary artery without angina pectoris: Secondary | ICD-10-CM | POA: Insufficient documentation

## 2019-12-31 DIAGNOSIS — R519 Headache, unspecified: Secondary | ICD-10-CM | POA: Insufficient documentation

## 2019-12-31 DIAGNOSIS — E559 Vitamin D deficiency, unspecified: Secondary | ICD-10-CM | POA: Insufficient documentation

## 2019-12-31 DIAGNOSIS — E1121 Type 2 diabetes mellitus with diabetic nephropathy: Secondary | ICD-10-CM | POA: Insufficient documentation

## 2019-12-31 DIAGNOSIS — J320 Chronic maxillary sinusitis: Secondary | ICD-10-CM | POA: Insufficient documentation

## 2019-12-31 DIAGNOSIS — G4730 Sleep apnea, unspecified: Secondary | ICD-10-CM | POA: Insufficient documentation

## 2019-12-31 DIAGNOSIS — F17219 Nicotine dependence, cigarettes, with unspecified nicotine-induced disorders: Secondary | ICD-10-CM | POA: Insufficient documentation

## 2019-12-31 DIAGNOSIS — I119 Hypertensive heart disease without heart failure: Secondary | ICD-10-CM | POA: Insufficient documentation

## 2019-12-31 NOTE — Telephone Encounter (Signed)
Pt calling requesting the results of the CT which was performed 7/27. Aaron Edelman, please advise.

## 2019-12-31 NOTE — Telephone Encounter (Signed)
As I explained to the patient when we receive the results we would be following up with her.  Upon chart review this morning they had not been finally read by radiology.  It appears that they have not been read.  The results are listed below:  Near complete resolution of right-sided pleural effusion with minimal residual pleural fluid versus pleural thickening.  This is good news this means of the right-sided pleural effusion that have been present is slowly going away.  There is also improved right lower lobe aeration with minimal right basilar atelectasis.  This is could be part of the reason why urgent care thought the patient had pneumonia.  Atelectasis is just partially where some of her lung is not fully inflated.  There is no significant mass or tumor.  There is a small subpleural nodule measuring 3 mm, we do not start worrying about these nodules until they are greater than 8 mm.  We will continue to keep an eye on this.  This is an unchanged right upper lobe nodule.  So this is stable and not growing in size.  No new recommendations based off the CT imaging.  There is no pneumonia.  Keep follow-up with our office.  We will also route results to Dr. Carlis Abbott as Juluis Rainier for her to review when she returns back to clinic.  Wyn Quaker, FNP

## 2020-01-01 ENCOUNTER — Other Ambulatory Visit: Payer: Self-pay

## 2020-01-01 ENCOUNTER — Ambulatory Visit (INDEPENDENT_AMBULATORY_CARE_PROVIDER_SITE_OTHER): Payer: Medicare Other | Admitting: Cardiology

## 2020-01-01 ENCOUNTER — Encounter: Payer: Self-pay | Admitting: Cardiology

## 2020-01-01 VITALS — BP 116/68 | HR 98 | Ht 65.0 in | Wt 177.0 lb

## 2020-01-01 DIAGNOSIS — I10 Essential (primary) hypertension: Secondary | ICD-10-CM | POA: Diagnosis not present

## 2020-01-01 DIAGNOSIS — F17210 Nicotine dependence, cigarettes, uncomplicated: Secondary | ICD-10-CM

## 2020-01-01 DIAGNOSIS — E782 Mixed hyperlipidemia: Secondary | ICD-10-CM

## 2020-01-01 DIAGNOSIS — I2510 Atherosclerotic heart disease of native coronary artery without angina pectoris: Secondary | ICD-10-CM

## 2020-01-01 DIAGNOSIS — E1121 Type 2 diabetes mellitus with diabetic nephropathy: Secondary | ICD-10-CM

## 2020-01-01 NOTE — Progress Notes (Signed)
Cardiology Office Note:    Date:  01/01/2020   ID:  Brenda Cochran, DOB 1951-01-19, MRN [id number]  PCP:  Rochel Brome, MD  Cardiologist:  Jenean Lindau, MD   Referring MD: Rochel Brome, MD    ASSESSMENT:    1. Coronary artery disease involving native coronary artery of native heart without angina pectoris   2. Essential hypertension   3. Mixed hyperlipidemia   4. Type 2 diabetes mellitus with diabetic nephropathy, without long-term current use of insulin (HCC)    PLAN:    In order of problems listed above:  1. Coronary artery disease: Secondary prevention stressed with the patient.  Importance of compliance with diet medication stressed and she vocalized understanding. 2. Essential hypertension: Blood pressure stable 3. Mixed dyslipidemia diabetes mellitus: Lipids were reviewed and diet emphasized.  She is on statin therapy. 4. Cigarette smoker: I spent 5 minutes with the patient discussing solely about smoking. Smoking cessation was counseled. I suggested to the patient also different medications and pharmacological interventions. Patient is keen to try stopping on its own at this time. He will get back to me if he needs any further assistance in this matter. 5. Reports of event monitor and echocardiogram done last time were discussed with her at extensive length and questions were answered to her satisfaction. 6. Patient will be seen in follow-up appointment in 6 months or earlier if the patient has any concerns    Medication Adjustments/Labs and Tests Ordered: Current medicines are reviewed at length with the patient today.  Concerns regarding medicines are outlined above.  No orders of the defined types were placed in this encounter.  No orders of the defined types were placed in this encounter.    No chief complaint on file.    History of Present Illness:    Brenda Cochran is a 69 y.o. female.  Patient has past medical history of coronary artery disease, essential  hypertension dyslipidemia and diabetes mellitus.  She denies any problems at this time and takes care of activities of daily living.  No chest pain orthopnea or PND.  At the time of my evaluation, the patient is alert awake oriented and in no distress.  She has had pleural effusion and she underwent a pleural tap for this and CT scan report reveals that it has nearly completely resolved.  At the time of my evaluation, the patient is alert awake oriented and in no distress.  Unfortunately she continues to smoke.  Past Medical History:  Diagnosis Date  . Angina pectoris ([place]) 01/10/2019  . Asthma   . Asthma, chronic obstructive, with acute exacerbation ([place])    [name] 01/13/2014  FEV1 76% predicted FVC 77% predicted FEV1 to FVC ratio 100% predicted FEF 25 7569% predicted CXR 01/13/14: NAD Alpha one antitrypsin 156  IgE 56 sl elevated.  RAST neg    . Atherosclerotic heart disease of native coronary artery without angina pectoris   . CAD (coronary artery disease) 09/23/2018  . Chronic maxillary sinusitis   . Chronic tension-type headache, intractable 08/07/2019  . Cigarette nicotine dependence with nicotine-induced disorder 08/18/2019  . Continuous dependence on cigarette smoking 06/09/2019  . COPD (chronic obstructive pulmonary disease) ([place])   . Coronary artery disease involving native coronary artery of native heart without angina pectoris 07/25/2018  . Cough 10/29/2019  . Depression   . Dyslipidemia associated with type 2 diabetes mellitus ([place]) 08/23/2018  . Essential hypertension 07/18/2018  . Fibromyalgia   . GERD (gastroesophageal reflux disease)   .  Headache   . Hyperlipidemia   . Hypertensive heart disease without heart failure   . Hypothyroidism (acquired)   . Loculated pleural effusion 12/30/2019  . Major depressive disorder, single episode, mild ([place])   . Menopausal and female climacteric states   . Mixed hyperlipidemia   . Nicotine dependence, cigarettes, with unspecified nicotine-induced  disorders   . NSTEMI (non-ST elevated myocardial infarction) ([place]) 07/12/2018  . Numbness and tingling in left upper extremity   . OSA (obstructive sleep apnea) 01/13/2014   Sleep study 02/19/14 : AHI 5.5.  Mild sleep apnea.  Lowest SpO2 81%   . Osteoarthritis   . Overweight   . Pain in right knee 07/04/2017  . Palpitations 08/15/2019  . Pleural effusion, not elsewhere classified 12/30/2019  . RA (rheumatoid arthritis) ([place])    [name]  . Shortness of breath 10/29/2019  . Skin cancer    face  . Sleep apnea    cpap  . TIA (transient ischemic attack) 09/14/2016  . Type 2 diabetes mellitus ([place])   . Type 2 diabetes mellitus with diabetic nephropathy ([place])   . Vitamin D deficiency, unspecified     Past Surgical History:  Procedure Laterality Date  . ABDOMINAL HYSTERECTOMY  1984  . BREAST LUMPECTOMY  1976   left, benign  . CHOLECYSTECTOMY  09/05/10  . CORONARY STENT INTERVENTION N/A 07/12/2018   Procedure: CORONARY STENT INTERVENTION;  Surgeon: Jettie Booze, MD;  Location: [facility];  Service: Cardiovascular;  Laterality: N/A;  . CORONARY THROMBECTOMY N/A 07/12/2018   Procedure: Coronary Thrombectomy;  Surgeon: Jettie Booze, MD;  Location: [facility];  Service: Cardiovascular;  Laterality: N/A;  . INTRAVASCULAR PRESSURE WIRE/FFR STUDY N/A 01/21/2019   Procedure: INTRAVASCULAR PRESSURE WIRE/FFR STUDY;  Surgeon: Jettie Booze, MD;  Location: [facility];  Service: Cardiovascular;  Laterality: N/A;  . LEFT HEART CATH AND CORONARY ANGIOGRAPHY N/A 07/12/2018   Procedure: LEFT HEART CATH AND CORONARY ANGIOGRAPHY;  Surgeon: Jettie Booze, MD;  Location: [facility];  Service: Cardiovascular;  Laterality: N/A;  . LEFT HEART CATH AND CORONARY ANGIOGRAPHY N/A 01/21/2019   Procedure: LEFT HEART CATH AND CORONARY ANGIOGRAPHY;  Surgeon: Jettie Booze, MD;  Location: [facility];  Service: Cardiovascular;  Laterality: N/A;     Current Medications: Current Meds  Medication Sig  . albuterol (VENTOLIN HFA) 108 (90 Base) MCG/ACT inhaler Inhale 1-2 puffs into the lungs every 6 (six) hours as needed for wheezing or shortness of breath.  Marland Kitchen aspirin EC 81 MG tablet Take 81 mg by mouth daily.  . benzonatate (TESSALON) 200 MG capsule Take 1 capsule (200 mg total) by mouth 3 (three) times daily as needed for cough.  . BRILINTA 90 MG TABS tablet Take 1 tablet by mouth twice daily  . Budeson-Glycopyrrol-Formoterol (BREZTRI AEROSPHERE) 160-9-4.8 MCG/ACT AERO Inhale 2 puffs into the lungs 2 (two) times daily.  Marland Kitchen leflunomide (ARAVA) 20 MG tablet Take 20 mg by mouth daily.  . montelukast (SINGULAIR) 10 MG tablet Take 1 tablet (10 mg total) by mouth at bedtime.  . nitroGLYCERIN (NITROSTAT) 0.4 MG SL tablet Place 1 tablet (0.4 mg total) under the tongue every 5 (five) minutes x 3 doses as needed for chest pain.  . ranolazine (RANEXA) 1000 MG SR tablet Take 0.5 tablets of 500 mg twice daily for 2 weeks then increase to 1 tablet twice daily thereafter.  . rosuvastatin (CRESTOR) 10 MG tablet TAKE 1 TABLET AT NIGHT FOR  HIGH CHOLESTEROL  . Semaglutide,0.25 or 0.5MG /DOS, (OZEMPIC, 0.25 OR 0.5 MG/DOSE,) 2 MG/1.5ML SOPN Inject 75 mg into the skin once a week. Patient says 3 clicks is what she takes  . varenicline (CHANTIX) 0.5 MG tablet Take 1 tablet (0.5 mg total) by mouth 2 (two) times daily.     Allergies:   Shellfish allergy, Contrast media [iodinated diagnostic agents], Metformin and related, Amitriptyline hcl, Cymbalta [duloxetine hcl], Ultram [tramadol], and Oxycontin [oxycodone hcl]   Social History   Socioeconomic History  . Marital status: Married    Spouse name: Not on file  . Number of children: 6  . Years of education: Not on file  . Highest education level: Not on file  Occupational History  . Occupation: disabled    Comment: jockey  Tobacco Use  . Smoking status: Current Every Day Smoker    Packs/day: 4.00     Years: 30.00    Pack years: 120.00    Types: Cigarettes  . Smokeless tobacco: Never Used  . Tobacco comment: now smoking 1/2 pack a day 7/27 ep  Vaping Use  . Vaping Use: Never used  Substance and Sexual Activity  . Alcohol use: No  . Drug use: No  . Sexual activity: Not on file  Other Topics Concern  . Not on file  Social History Narrative   Lives with Cheryllynn Sarff husband.   Disabled   Social Determinants of Health   Financial Resource Strain:   . Difficulty of Paying Living Expenses:   Food Insecurity:   . Worried About Charity fundraiser in the Last Year:   . Arboriculturist in the Last Year:   Transportation Needs:   . Film/video editor (Medical):   Marland Kitchen Lack of Transportation (Non-Medical):   Physical Activity:   . Days of Exercise per Week:   . Minutes of Exercise per Session:   Stress:   . Feeling of Stress :   Social Connections:   . Frequency of Communication with Friends and Family:   . Frequency of Social Gatherings with Friends and Family:   . Attends Religious Services:   . Active Member of Clubs or Organizations:   . Attends Archivist Meetings:   Marland Kitchen Marital Status:      Family History: The patient's family history includes Bone cancer in her father; Breast cancer in her sister; CAD in an other family member; COPD in her brother; Diabetes in an other family member; Hypertension in an other family member; Lung cancer in her father; Lung disease in her maternal uncle, maternal uncle, and mother; Lymphoma in her sister; Multiple sclerosis in her brother; Muscular dystrophy in her brother; Seizures in an other family member. There is no history of Colon cancer, Colon polyps, Esophageal cancer, Stomach cancer, or Rectal cancer.  ROS:   Please see the history of present illness.    All other systems reviewed and are negative.  EKGs/Labs/Other Studies Reviewed:    The following studies were reviewed today: IMPRESSIONS    1. Left ventricular  ejection fraction by 3D volume is 53 %. The left  ventricle has normal function. The left ventricle has no regional wall  motion abnormalities. Left ventricular diastolic parameters are consistent  with Grade I diastolic dysfunction  (impaired relaxation). The average left ventricular global longitudinal  strain is -20.2 % (normal).  2. Right ventricular systolic function is normal. The right ventricular  size is normal. Tricuspid regurgitation signal is inadequate for assessing  PA  pressure.  3. The mitral valve is normal in structure. No evidence of mitral valve  regurgitation. No evidence of mitral stenosis.  4. The aortic valve is normal in structure. Aortic valve regurgitation is  not visualized. No aortic stenosis is present.  5. The inferior vena cava is normal in size with greater than 50%  respiratory variability, suggesting right atrial pressure of 3 mmHg.   EVENT MONITOR REPORT:   Patient was monitored from 08/15/2019 to 08/29/2018. Indication:                    Palpitations Ordering physician:  Jenean Lindau, MD  Referring physician:        Jenean Lindau, MD    Baseline rhythm: Sinus  Minimum heart rate: 59 BPM.  Average heart rate: 90 BPM.  Maximal heart rate 156 BPM.  Atrial arrhythmia: Rare brief atrial runs  Ventricular arrhythmia: Rare PVCs  Conduction abnormality: None significant  Symptoms: None significant   Conclusion:  Mildly abnormal but overall unremarkable event monitoring.  Rare brief atrial runs were noted.  Interpreting  cardiologist: Jenean Lindau, MD  Date: 09/12/2019 11:56 AM    Recent Labs: 08/15/2019: TSH 1.570 10/29/2019: NT-Pro BNP 75 10/30/2019: Hemoglobin 13.0; Platelets 351 11/06/2019: ALT 12; BUN 9; Creatinine, Ser 0.65; Potassium 3.5; Sodium 136  Recent Lipid Panel    Component Value Date/Time   CHOL 118 08/15/2019 0745   TRIG 72 08/15/2019 0745   HDL 46 08/15/2019 0745   CHOLHDL 2.6 08/15/2019 0745    CHOLHDL 5.7 07/12/2018 0333   VLDL 29 07/12/2018 0333   LDLCALC 57 08/15/2019 0745    Physical Exam:    VS:  BP 116/68   Pulse 98   Ht 5\' 5"  (1.651 m)   Wt 177 lb (80.3 kg)   SpO2 95%   BMI 29.45 kg/m     Wt Readings from Last 3 Encounters:  01/01/20 177 lb (80.3 kg)  12/30/19 178 lb (80.7 kg)  12/16/19 174 lb 9.6 oz (79.2 kg)     GEN: Patient is in no acute distress HEENT: Normal NECK: No JVD; No carotid bruits LYMPHATICS: No lymphadenopathy CARDIAC: Hear sounds regular, 2/6 systolic murmur at the apex. RESPIRATORY:  Clear to auscultation without rales, wheezing or rhonchi  ABDOMEN: Soft, non-tender, non-distended MUSCULOSKELETAL:  No edema; No deformity  SKIN: Warm and dry NEUROLOGIC:  Alert and oriented x 3 PSYCHIATRIC:  Normal affect   Signed, Jenean Lindau, MD  01/01/2020 8:45 AM    [place]

## 2020-01-01 NOTE — Telephone Encounter (Signed)
Pt called back, please return call. Pt would also like to apologize for getting angry at nurse the other day.

## 2020-01-01 NOTE — Telephone Encounter (Signed)
LMTCB x 1 

## 2020-01-01 NOTE — Patient Instructions (Signed)

## 2020-01-01 NOTE — Telephone Encounter (Signed)
I spoke with the pt and notified of results as stated per Aaron Edelman and she verbalized understanding. Will now forward to Dr Carlis Abbott.

## 2020-01-04 DIAGNOSIS — U071 COVID-19: Secondary | ICD-10-CM | POA: Diagnosis not present

## 2020-01-05 DIAGNOSIS — Z794 Long term (current) use of insulin: Secondary | ICD-10-CM | POA: Diagnosis not present

## 2020-01-05 DIAGNOSIS — I252 Old myocardial infarction: Secondary | ICD-10-CM | POA: Diagnosis not present

## 2020-01-05 DIAGNOSIS — M1990 Unspecified osteoarthritis, unspecified site: Secondary | ICD-10-CM | POA: Diagnosis not present

## 2020-01-05 DIAGNOSIS — J449 Chronic obstructive pulmonary disease, unspecified: Secondary | ICD-10-CM | POA: Diagnosis not present

## 2020-01-05 DIAGNOSIS — Z79891 Long term (current) use of opiate analgesic: Secondary | ICD-10-CM | POA: Diagnosis not present

## 2020-01-05 DIAGNOSIS — I5032 Chronic diastolic (congestive) heart failure: Secondary | ICD-10-CM | POA: Diagnosis not present

## 2020-01-05 DIAGNOSIS — Z7982 Long term (current) use of aspirin: Secondary | ICD-10-CM | POA: Diagnosis not present

## 2020-01-05 DIAGNOSIS — R062 Wheezing: Secondary | ICD-10-CM | POA: Diagnosis not present

## 2020-01-05 DIAGNOSIS — I110 Hypertensive heart disease with heart failure: Secondary | ICD-10-CM | POA: Diagnosis not present

## 2020-01-05 DIAGNOSIS — Z87891 Personal history of nicotine dependence: Secondary | ICD-10-CM | POA: Diagnosis not present

## 2020-01-05 DIAGNOSIS — U071 COVID-19: Secondary | ICD-10-CM | POA: Diagnosis not present

## 2020-01-05 DIAGNOSIS — J45901 Unspecified asthma with (acute) exacerbation: Secondary | ICD-10-CM | POA: Diagnosis not present

## 2020-01-05 DIAGNOSIS — E039 Hypothyroidism, unspecified: Secondary | ICD-10-CM | POA: Diagnosis not present

## 2020-01-05 NOTE — Telephone Encounter (Signed)
Thanks for keeping me updated.  Julian Hy, DO 01/05/20 4:40 PM [facility]

## 2020-01-06 ENCOUNTER — Other Ambulatory Visit: Payer: Self-pay | Admitting: Physician Assistant

## 2020-01-06 ENCOUNTER — Telehealth (HOSPITAL_COMMUNITY): Payer: Self-pay | Admitting: Nurse Practitioner

## 2020-01-06 ENCOUNTER — Encounter: Payer: Self-pay | Admitting: Family Medicine

## 2020-01-06 ENCOUNTER — Telehealth (INDEPENDENT_AMBULATORY_CARE_PROVIDER_SITE_OTHER): Payer: Medicare Other | Admitting: Family Medicine

## 2020-01-06 VITALS — Temp 100.0°F

## 2020-01-06 DIAGNOSIS — J441 Chronic obstructive pulmonary disease with (acute) exacerbation: Secondary | ICD-10-CM

## 2020-01-06 DIAGNOSIS — U071 COVID-19: Secondary | ICD-10-CM

## 2020-01-06 DIAGNOSIS — J45901 Unspecified asthma with (acute) exacerbation: Secondary | ICD-10-CM

## 2020-01-06 DIAGNOSIS — E1169 Type 2 diabetes mellitus with other specified complication: Secondary | ICD-10-CM

## 2020-01-06 DIAGNOSIS — J1282 Pneumonia due to coronavirus disease 2019: Secondary | ICD-10-CM

## 2020-01-06 DIAGNOSIS — M05749 Rheumatoid arthritis with rheumatoid factor of unspecified hand without organ or systems involvement: Secondary | ICD-10-CM

## 2020-01-06 DIAGNOSIS — E1121 Type 2 diabetes mellitus with diabetic nephropathy: Secondary | ICD-10-CM

## 2020-01-06 DIAGNOSIS — I10 Essential (primary) hypertension: Secondary | ICD-10-CM

## 2020-01-06 DIAGNOSIS — I2510 Atherosclerotic heart disease of native coronary artery without angina pectoris: Secondary | ICD-10-CM

## 2020-01-06 HISTORY — DX: COVID-19: U07.1

## 2020-01-06 HISTORY — DX: Pneumonia due to coronavirus disease 2019: J12.82

## 2020-01-06 NOTE — Telephone Encounter (Signed)
Called to Discuss with patient about Covid symptoms and the use of regeneron, a monoclonal antibody infusion for those with mild to moderate Covid symptoms and at a high risk of hospitalization.     Pt is qualified for this infusion at the [facility] infusion center due to co-morbid conditions and/or a member of an at-risk group.     Unable to reach pt. Left message to return call  Beckey Rutter, [place], AGNP-C [phone] ([place])

## 2020-01-06 NOTE — Progress Notes (Signed)
Virtual Visit via Telephone Note   This visit type was conducted due to national recommendations for restrictions regarding the COVID-19 Pandemic (e.g. social distancing) in an effort to limit this patient's exposure and mitigate transmission in our community.  Due to co-morbid illnesses, this patient is at least at moderate risk for complications without adequate follow up.  This format is felt to be most appropriate for this patient at this time.  The patient did not have access to video technology/had technical difficulties with video requiring transitioning to audio format only (telephone).  All issues noted in this document were discussed and addressed.  No physical exam could be performed with this format.  Patient verbally consented to a telehealth visit.   Patient Location: Home Provider Location: Office/Clinic  PCP:  Rochel Brome, MD  Evaluation Performed:  Acute Visit  Chief Complaint:  Cough/SOB/Covid +.   Subjective:    Patient ID: Brenda Cochran, female    DOB: Jan 22, 1951, 69 y.o.   MRN: [id number]  Chief Complaint  Patient presents with  . Cough  . Fever  . Chills    HPI Patient presents today for follow-up from the emergency department for Covid pneumonia via telephone.  Patient was seen last night at [facility].  She had been seen at the urgent care on Sunday January 04, 2020 after her husband had been diagnosed with Covid Saturday, January 03, 2020.  She tested positive for Covid.  She has had her Covid vaccine.  She has had cough, fever, chills, shortness of breath.  She went to the emergency department last night due to increasing shortness of breath.  They did prescribe her prednisone 50 mg once daily which she has not picked up yet.  She is diabetic and her sugars been over 300.  Her sugar this morning was 499.  The patient is noncompliant with her diabetes medications and her diet.  Past Medical History:  Diagnosis Date  . Angina pectoris ([place]) 01/10/2019  .  Asthma   . Asthma, chronic obstructive, with acute exacerbation ([place])    [name] 01/13/2014  FEV1 76% predicted FVC 77% predicted FEV1 to FVC ratio 100% predicted FEF 25 7569% predicted CXR 01/13/14: NAD Alpha one antitrypsin 156  IgE 56 sl elevated.  RAST neg    . Atherosclerotic heart disease of native coronary artery without angina pectoris   . CAD (coronary artery disease) 09/23/2018  . Chronic maxillary sinusitis   . Chronic tension-type headache, intractable 08/07/2019  . Cigarette nicotine dependence with nicotine-induced disorder 08/18/2019  . Continuous dependence on cigarette smoking 06/09/2019  . COPD (chronic obstructive pulmonary disease) ([place])   . Coronary artery disease involving native coronary artery of native heart without angina pectoris 07/25/2018  . Cough 10/29/2019  . Depression   . Dyslipidemia associated with type 2 diabetes mellitus ([place]) 08/23/2018  . Essential hypertension 07/18/2018  . Fibromyalgia   . GERD (gastroesophageal reflux disease)   . Headache   . Hyperlipidemia   . Hypertensive heart disease without heart failure   . Hypothyroidism (acquired)   . Loculated pleural effusion 12/30/2019  . Major depressive disorder, single episode, mild ([place])   . Menopausal and female climacteric states   . Mixed hyperlipidemia   . Nicotine dependence, cigarettes, with unspecified nicotine-induced disorders   . NSTEMI (non-ST elevated myocardial infarction) ([place]) 07/12/2018  . Numbness and tingling in left upper extremity   . OSA (obstructive sleep apnea) 01/13/2014   Sleep study 02/19/14 : AHI 5.5.  Mild  sleep apnea.  Lowest SpO2 81%   . Osteoarthritis   . Overweight   . Pain in right knee 07/04/2017  . Palpitations 08/15/2019  . Pleural effusion, not elsewhere classified 12/30/2019  . RA (rheumatoid arthritis) ([place])    [name]  . Shortness of breath 10/29/2019  . Skin cancer    face  . Sleep apnea    cpap  . TIA (transient ischemic attack) 09/14/2016  . Type 2 diabetes  mellitus ([place])   . Type 2 diabetes mellitus with diabetic nephropathy ([place])   . Vitamin D deficiency, unspecified     Past Surgical History:  Procedure Laterality Date  . ABDOMINAL HYSTERECTOMY  1984  . BREAST LUMPECTOMY  1976   left, benign  . CHOLECYSTECTOMY  09/05/10  . CORONARY STENT INTERVENTION N/A 07/12/2018   Procedure: CORONARY STENT INTERVENTION;  Surgeon: Jettie Booze, MD;  Location: [place] [facility];  Service: Cardiovascular;  Laterality: N/A;  . CORONARY THROMBECTOMY N/A 07/12/2018   Procedure: Coronary Thrombectomy;  Surgeon: Jettie Booze, MD;  Location: [place] [facility];  Service: Cardiovascular;  Laterality: N/A;  . INTRAVASCULAR PRESSURE WIRE/FFR STUDY N/A 01/21/2019   Procedure: INTRAVASCULAR PRESSURE WIRE/FFR STUDY;  Surgeon: Jettie Booze, MD;  Location: [place];  Service: Cardiovascular;  Laterality: N/A;  . LEFT HEART CATH AND CORONARY ANGIOGRAPHY N/A 07/12/2018   Procedure: LEFT HEART CATH AND CORONARY ANGIOGRAPHY;  Surgeon: Jettie Booze, MD;  Location: [place];  Service: Cardiovascular;  Laterality: N/A;  . LEFT HEART CATH AND CORONARY ANGIOGRAPHY N/A 01/21/2019   Procedure: LEFT HEART CATH AND CORONARY ANGIOGRAPHY;  Surgeon: Jettie Booze, MD;  Location: [place] [facility];  Service: Cardiovascular;  Laterality: N/A;    Family History  Problem Relation Age of Onset  . Breast cancer Sister   . Lymphoma Sister   . Multiple sclerosis Brother   . Muscular dystrophy Brother   . CAD Other   . Diabetes Other   . Hypertension Other   . Seizures Other   . Lung disease Mother        never smoker  . Lung disease Maternal Uncle        lung cancer  . Lung disease Maternal Uncle   . Lung cancer Father   . Bone cancer Father   . COPD Brother   . Colon cancer Neg Hx   . Colon polyps Neg Hx   . Esophageal cancer Neg Hx   . Stomach cancer Neg Hx   . Rectal cancer Neg Hx     Social History   Socioeconomic  History  . Marital status: Married    Spouse name: Not on file  . Number of children: 6  . Years of education: Not on file  . Highest education level: Not on file  Occupational History  . Occupation: disabled    Comment: jockey  Tobacco Use  . Smoking status: Current Every Day Smoker    Packs/day: 4.00    Years: 30.00    Pack years: 120.00    Types: Cigarettes  . Smokeless tobacco: Never Used  . Tobacco comment: now smoking 1/2 pack a day 7/27 ep  Vaping Use  . Vaping Use: Never used  Substance and Sexual Activity  . Alcohol use: No  . Drug use: No  . Sexual activity: Not on file  Other Topics Concern  . Not on file  Social History Narrative   Lives with Lashundra Shiveley husband.  Disabled   Social Determinants of Health   Financial Resource Strain:   . Difficulty of Paying Living Expenses:   Food Insecurity:   . Worried About Charity fundraiser in the Last Year:   . Arboriculturist in the Last Year:   Transportation Needs:   . Film/video editor (Medical):   Marland Kitchen Lack of Transportation (Non-Medical):   Physical Activity:   . Days of Exercise per Week:   . Minutes of Exercise per Session:   Stress:   . Feeling of Stress :   Social Connections:   . Frequency of Communication with Friends and Family:   . Frequency of Social Gatherings with Friends and Family:   . Attends Religious Services:   . Active Member of Clubs or Organizations:   . Attends Archivist Meetings:   Marland Kitchen Marital Status:   Intimate Partner Violence:   . Fear of Current or Ex-Partner:   . Emotionally Abused:   Marland Kitchen Physically Abused:   . Sexually Abused:     Outpatient Medications Prior to Visit  Medication Sig Dispense Refill  . albuterol (VENTOLIN HFA) 108 (90 Base) MCG/ACT inhaler Inhale 1-2 puffs into the lungs every 6 (six) hours as needed for wheezing or shortness of breath.    Marland Kitchen aspirin EC 81 MG tablet Take 81 mg by mouth daily.    . benzonatate (TESSALON) 200 MG capsule Take 1  capsule (200 mg total) by mouth 3 (three) times daily as needed for cough. 30 capsule 1  . BRILINTA 90 MG TABS tablet Take 1 tablet by mouth twice daily 180 tablet 3  . Budeson-Glycopyrrol-Formoterol (BREZTRI AEROSPHERE) 160-9-4.8 MCG/ACT AERO Inhale 2 puffs into the lungs 2 (two) times daily. 10.7 g 3  . leflunomide (ARAVA) 20 MG tablet Take 20 mg by mouth daily.    . montelukast (SINGULAIR) 10 MG tablet Take 1 tablet (10 mg total) by mouth at bedtime. 30 tablet 11  . nitroGLYCERIN (NITROSTAT) 0.4 MG SL tablet Place 1 tablet (0.4 mg total) under the tongue every 5 (five) minutes x 3 doses as needed for chest pain. 25 tablet 12  . ranolazine (RANEXA) 1000 MG SR tablet Take 0.5 tablets of 500 mg twice daily for 2 weeks then increase to 1 tablet twice daily thereafter. 180 tablet 3  . rosuvastatin (CRESTOR) 10 MG tablet TAKE 1 TABLET AT NIGHT FOR HIGH CHOLESTEROL 90 tablet 2  . Semaglutide,0.25 or 0.5MG /DOS, (OZEMPIC, 0.25 OR 0.5 MG/DOSE,) 2 MG/1.5ML SOPN Inject 75 mg into the skin once a week. Patient says 3 clicks is what she takes    . varenicline (CHANTIX) 0.5 MG tablet Take 1 tablet (0.5 mg total) by mouth 2 (two) times daily. 60 tablet 0   No facility-administered medications prior to visit.    Allergies  Allergen Reactions  . Shellfish Allergy Anaphylaxis  . Contrast Media [Iodinated Diagnostic Agents] Rash  . Metformin And Related Nausea And Vomiting  . Amitriptyline Hcl Other (See Comments)    Somnolence  . Cymbalta [Duloxetine Hcl] Nausea And Vomiting  . Ultram [Tramadol] Nausea And Vomiting  . Oxycontin [Oxycodone Hcl] Palpitations    Review of Systems  Constitutional: Positive for chills, fatigue and fever.  HENT: Positive for congestion and ear pain (left). Negative for rhinorrhea and sore throat.   Respiratory: Positive for cough, shortness of breath and wheezing.   Cardiovascular: Negative for chest pain and palpitations.  Gastrointestinal: Negative for abdominal pain,  constipation, diarrhea, nausea and vomiting.  Genitourinary: Negative for dysuria and urgency.  Musculoskeletal: Negative for back pain and myalgias.  Neurological: Positive for headaches. Negative for dizziness, weakness and light-headedness.  Psychiatric/Behavioral: Negative for dysphoric mood. The patient is not nervous/anxious.        Objective:    Physical Exam She is not dyspneic on the phone.  Daily distress is related to her husband's worsening in the hospital.  Temp 100 F (37.8 C)   SpO2 91%  Wt Readings from Last 3 Encounters:  01/01/20 177 lb (80.3 kg)  12/30/19 178 lb (80.7 kg)  12/16/19 174 lb 9.6 oz (79.2 kg)    Health Maintenance Due  Topic Date Due  . Hepatitis C Screening  Never done  . FOOT EXAM  Never done  . OPHTHALMOLOGY EXAM  Never done  . URINE MICROALBUMIN  Never done  . TETANUS/TDAP  Never done  . MAMMOGRAM  07/30/2016  . DEXA SCAN  Never done  . INFLUENZA VACCINE  01/04/2020    There are no preventive care reminders to display for this patient.   Lab Results  Component Value Date   TSH 1.570 08/15/2019   Lab Results  Component Value Date   WBC 8.1 10/30/2019   HGB 13.0 10/30/2019   HCT 40.3 10/30/2019   MCV 93.3 10/30/2019   PLT 351 10/30/2019   Lab Results  Component Value Date   NA 136 11/06/2019   K 3.5 11/06/2019   CO2 28 11/06/2019   GLUCOSE 158 (H) 11/06/2019   BUN 9 11/06/2019   CREATININE 0.65 11/06/2019   BILITOT 0.3 11/06/2019   ALKPHOS 116 11/06/2019   AST 10 11/06/2019   ALT 12 11/06/2019   PROT 7.2 11/06/2019   ALBUMIN 3.5 11/06/2019   CALCIUM 9.6 11/06/2019   ANIONGAP 10 10/30/2019   GFR 90.62 11/06/2019   Lab Results  Component Value Date   CHOL 118 08/15/2019   Lab Results  Component Value Date   HDL 46 08/15/2019   Lab Results  Component Value Date   LDLCALC 57 08/15/2019   Lab Results  Component Value Date   TRIG 72 08/15/2019   Lab Results  Component Value Date   CHOLHDL 2.6 08/15/2019     Lab Results  Component Value Date   HGBA1C 6.4 (H) 08/15/2019       Assessment & Plan:  1. Pneumonia due to [id number]-03 virus: Complicated by coronary artery disease and COPD.  Referred for IGG Antibody Infusion.  2. Type 2 diabetes mellitus with diabetic nephropathy, without long-term current use of insulin ([place])   Start on Toujeo 20 U daily. Hold of on starting prednisone due to very abnormal sugars.  Time: Spent 30 minutes on phone discussing her and situation. Quarantine for 14 days from symptom onset.   Follow-up: Go to ED if worsening symptoms.   An After Visit Summary was printed and given to the patient.  Rochel Brome [facility] [phone]

## 2020-01-06 NOTE — Progress Notes (Addendum)
I connected by phone with Brenda Cochran on 01/06/2020 at 5:43 PM to discuss the potential use of a new treatment for mild to moderate COVID-19 viral infection in non-hospitalized patients.  This patient is a 69 y.o. female that meets the FDA criteria for Emergency Use Authorization of COVID monoclonal antibody casirivimab/imdevimab.  Has a (+) direct SARS-CoV-2 viral test result  Has mild or moderate COVID-19   Is NOT hospitalized due to COVID-19  Is within 10 days of symptom onset  Has at least one of the high risk factor(s) for progression to severe COVID-19 and/or hospitalization as defined in EUA.  Specific high risk criteria : Older age (>/= 68 yo), BMI >25, COPD, HTN, CAD, DMT2   I have spoken and communicated the following to the patient or parent/caregiver regarding COVID monoclonal antibody treatment:  1. FDA has authorized the emergency use for the treatment of mild to moderate COVID-19 in adults and pediatric patients with positive results of direct SARS-CoV-2 viral testing who are 47 years of age and older weighing at least 40 kg, and who are at high risk for progressing to severe COVID-19 and/or hospitalization.  2. The significant known and potential risks and benefits of COVID monoclonal antibody, and the extent to which such potential risks and benefits are unknown.  3. Information on available alternative treatments and the risks and benefits of those alternatives, including clinical trials.  4. Patients treated with COVID monoclonal antibody should continue to self-isolate and use infection control measures (e.g., wear mask, isolate, social distance, avoid sharing personal items, clean and disinfect "high touch" surfaces, and frequent handwashing) according to CDC guidelines.   5. The patient or parent/caregiver has the option to accept or refuse COVID monoclonal antibody treatment.  After reviewing this information with the patient, The patient agreed to proceed with  receiving casirivimab\imdevimab infusion and will be provided a copy of the Fact sheet prior to receiving the infusion.  Sx onset 7/31. Set up for infusion on 8/6 @8 :30am. Directions given to [facility]. Pt is aware that insurance will be charged an infusion fee. Of note, pt is fully vaccinated with [employer]. Added to our breakthrough list.   [name] 01/06/2020 5:43 PM

## 2020-01-08 ENCOUNTER — Ambulatory Visit (HOSPITAL_COMMUNITY): Payer: Medicare Other

## 2020-01-08 MED ORDER — SODIUM CHLORIDE 0.9 % IV SOLN
Freq: Once | INTRAVENOUS | Status: AC
  Filled 2020-01-08: qty 600

## 2020-01-09 ENCOUNTER — Ambulatory Visit (HOSPITAL_COMMUNITY)
Admission: RE | Admit: 2020-01-09 | Discharge: 2020-01-09 | Disposition: A | Discharge status: Home / Self Care | Payer: Medicare Other | Source: Ambulatory Visit | Attending: Pulmonary Disease | Admitting: Pulmonary Disease

## 2020-01-09 DIAGNOSIS — I2510 Atherosclerotic heart disease of native coronary artery without angina pectoris: Secondary | ICD-10-CM | POA: Diagnosis not present

## 2020-01-09 DIAGNOSIS — I10 Essential (primary) hypertension: Secondary | ICD-10-CM | POA: Insufficient documentation

## 2020-01-09 DIAGNOSIS — Z23 Encounter for immunization: Secondary | ICD-10-CM | POA: Insufficient documentation

## 2020-01-09 DIAGNOSIS — J45901 Unspecified asthma with (acute) exacerbation: Secondary | ICD-10-CM

## 2020-01-09 DIAGNOSIS — M05749 Rheumatoid arthritis with rheumatoid factor of unspecified hand without organ or systems involvement: Secondary | ICD-10-CM | POA: Diagnosis not present

## 2020-01-09 DIAGNOSIS — U071 COVID-19: Secondary | ICD-10-CM | POA: Diagnosis not present

## 2020-01-09 DIAGNOSIS — E1169 Type 2 diabetes mellitus with other specified complication: Secondary | ICD-10-CM | POA: Diagnosis not present

## 2020-01-09 DIAGNOSIS — J441 Chronic obstructive pulmonary disease with (acute) exacerbation: Secondary | ICD-10-CM

## 2020-01-09 MED ORDER — FAMOTIDINE IN NACL 20-0.9 MG/50ML-% IV SOLN: 20.0000 mg | Freq: Once | INTRAVENOUS | Status: DC | PRN

## 2020-01-09 MED ORDER — EPINEPHRINE 0.3 MG/0.3ML IJ SOAJ: 0.3000 mg | Freq: Once | INTRAMUSCULAR | Status: DC | PRN

## 2020-01-09 MED ORDER — METHYLPREDNISOLONE SODIUM SUCC 125 MG IJ SOLR: 125.0000 mg | Freq: Once | INTRAMUSCULAR | Status: DC | PRN

## 2020-01-09 MED ORDER — DIPHENHYDRAMINE HCL 50 MG/ML IJ SOLN: 50.0000 mg | Freq: Once | INTRAMUSCULAR | Status: DC | PRN

## 2020-01-09 MED ORDER — ALBUTEROL SULFATE HFA 108 (90 BASE) MCG/ACT IN AERS: 2.0000 | INHALATION_SPRAY | Freq: Once | RESPIRATORY_TRACT | Status: DC | PRN

## 2020-01-09 MED ORDER — SODIUM CHLORIDE 0.9 % IV SOLN: INTRAVENOUS | Status: DC | PRN

## 2020-01-09 NOTE — Discharge Instructions (Signed)

## 2020-01-09 NOTE — Progress Notes (Signed)
  Diagnosis: COVID-19  Physician:Dr Joya Gaskins   Procedure: Covid Infusion Clinic Med: casirivimab\imdevimab infusion - Provided patient with casirivimab\imdevimab fact sheet for patients, parents and caregivers prior to infusion.  Complications: No immediate complications noted.  Discharge: Discharged home   Brenda Cochran 01/09/2020

## 2020-01-20 ENCOUNTER — Telehealth: Payer: Self-pay | Admitting: Cardiology

## 2020-01-20 NOTE — Telephone Encounter (Signed)
LMTCB

## 2020-01-20 NOTE — Telephone Encounter (Signed)
Brenda Cochran is calling stating her PCP office advised her to call and report her current symptoms to her Cardiologist. Brenda Cochran states she is having pain in her left arm in between her shoulder and elbow the same arm she was vaccinated in back in April. She states when the pain in her arm occurs she also experiences and tingling sensation around her mouth. This has been occurring on and off for the past 5 days. [name] also recently had Covid and received antibodies ordered by her PCP during that time. She has since recovered and is out of quarantine, but is concerned with these recent occurring symptoms.  Please advise.

## 2020-01-21 NOTE — Telephone Encounter (Signed)
Spoke with pt who states that she feels this is stress related. I told the pt that I would call her back once Dr. Geraldo Pitter had reviewed. Pt is aware that it will be approximately 5 days. Pt verbalized understanding and will reach out to [place] sooner if any thing changes.

## 2020-01-22 ENCOUNTER — Ambulatory Visit: Payer: Medicare Other | Admitting: Family Medicine

## 2020-01-26 NOTE — Telephone Encounter (Signed)
Left VM to callback to discuss Dr. Julien Nordmann recommendations.

## 2020-01-26 NOTE — Telephone Encounter (Signed)
She could ask her primary care physician to give her medications to help her relax.  If this is stress related it might help.  Also make sure patient has nitroglycerin prescription that she can use as needed.  If it does not help she needs to go to nearest emergency room.

## 2020-02-03 ENCOUNTER — Telehealth: Payer: Self-pay

## 2020-02-05 ENCOUNTER — Telehealth: Payer: Self-pay

## 2020-02-05 ENCOUNTER — Other Ambulatory Visit: Payer: Self-pay | Admitting: Family Medicine

## 2020-02-05 ENCOUNTER — Other Ambulatory Visit: Payer: Self-pay

## 2020-02-05 MED ORDER — LORAZEPAM 0.5 MG PO TABS
0.5000 mg | ORAL_TABLET | Freq: Two times a day (BID) | ORAL | 0 refills | Status: DC | PRN
Start: 2020-02-05 — End: 2020-02-05

## 2020-02-05 MED ORDER — LORAZEPAM 0.5 MG PO TABS: 0.5000 mg | ORAL_TABLET | Freq: Two times a day (BID) | ORAL | 0 refills | Status: DC | PRN

## 2020-02-05 NOTE — Telephone Encounter (Signed)
Please call in med for anxiety to [facility] pt having difficulty with breathing & chest pain & shoulder pain. Pt was advised by cardiologist today to call PCP for anxiety med due to symptoms and spouse passed away yesterday.

## 2020-02-05 NOTE — Telephone Encounter (Signed)
Pt daughter [place] called to request anxiety medication for mom

## 2020-02-05 NOTE — Telephone Encounter (Signed)
Left message. Please tell her how sorry I am about Legrand Como.  I would like pt to start on a daily medicine, such as celexa, zoloft, or effexor xr. She has taken all of these in the last 3 years. Not sure which worked best. Please ask and let me know.  I sent lorazepam for acute anxiety. May take twice a day prn.  Recommend set up a follow up in 3-4 weeks.

## 2020-02-09 NOTE — Progress Notes (Deleted)
02/09/2020 Brenda Cochran [id number] Jul 01, 1950   Chief Complaint:  History of Present Illness: Brenda Cochran. Sorce is a 69 year old female with a past medical history of anxiety, depression, fibromyalgia, hypertension, coronary artery disease  MI  S/p stent 07/2018, TIA, DM II, OSA, asthma, COPD, hypothyroidism, GERD   She presents today for further evaluation for dysphagia.   Prior patient of Dr. Steve Rattler Spouse passed away February 12, 2023  CBC Latest Ref Rng & Units 10/30/2019 10/29/2019 08/15/2019  WBC 4.0 - 10.5 K/uL 8.1 9.0 8.0  Hemoglobin 12.0 - 15.0 g/dL 13.0 14.4 14.3  Hematocrit 36 - 46 % 40.3 41.9 43.0  Platelets 150 - 400 K/uL 351 342.0 300   CMP Latest Ref Rng & Units 11/06/2019 11/06/2019 10/30/2019  Glucose 70 - 99 mg/dL - 158(H) 184(H)  BUN 6 - 23 mg/dL - 9 9  Creatinine 0.40 - 1.20 mg/dL - 0.65 0.60  Sodium 135 - 145 mEq/L - 136 138  Potassium 3.5 - 5.1 mEq/L - 3.5 4.3  Chloride 96 - 112 mEq/L - 99 103  CO2 19 - 32 mEq/L - 28 25  Calcium 8.4 - 10.5 mg/dL - 9.6 8.8(L)  Total Protein 6.0 - 8.3 g/dL 7.2 7.3 -  Total Bilirubin 0.2 - 1.2 mg/dL - 0.3 -  Alkaline Phos 39 - 117 U/L - 116 -  AST 0 - 37 U/L - 10 -  ALT 0 - 35 U/L - 12 -    Past Medical History:  Diagnosis Date  . Angina pectoris ([place]) 01/10/2019  . Asthma   . Asthma, chronic obstructive, with acute exacerbation ([place])    [name] 01/13/2014  FEV1 76% predicted FVC 77% predicted FEV1 to FVC ratio 100% predicted FEF 25 7569% predicted CXR 01/13/14: NAD Alpha one antitrypsin 156  IgE 56 sl elevated.  RAST neg    . Atherosclerotic heart disease of native coronary artery without angina pectoris   . CAD (coronary artery disease) 09/23/2018  . Chronic maxillary sinusitis   . Chronic tension-type headache, intractable 08/07/2019  . Cigarette nicotine dependence with nicotine-induced disorder 08/18/2019  . Continuous dependence on cigarette smoking 06/09/2019  . COPD (chronic obstructive pulmonary disease) ([place])   . Coronary artery disease  involving native coronary artery of native heart without angina pectoris 07/25/2018  . Cough 10/29/2019  . Depression   . Dyslipidemia associated with type 2 diabetes mellitus ([place]) 08/23/2018  . Essential hypertension 07/18/2018  . Fibromyalgia   . GERD (gastroesophageal reflux disease)   . Headache   . Hyperlipidemia   . Hypertensive heart disease without heart failure   . Hypothyroidism (acquired)   . Loculated pleural effusion 12/30/2019  . Major depressive disorder, single episode, mild ([place])   . Menopausal and female climacteric states   . Mixed hyperlipidemia   . Nicotine dependence, cigarettes, with unspecified nicotine-induced disorders   . NSTEMI (non-ST elevated myocardial infarction) ([place]) 07/12/2018  . Numbness and tingling in left upper extremity   . OSA (obstructive sleep apnea) 01/13/2014   Sleep study 02/19/14 : AHI 5.5.  Mild sleep apnea.  Lowest SpO2 81%   . Osteoarthritis   . Overweight   . Pain in right knee 07/04/2017  . Palpitations 08/15/2019  . Pleural effusion, not elsewhere classified 12/30/2019  . RA (rheumatoid arthritis) ([place])    [name]  . Shortness of breath 10/29/2019  . Skin cancer    face  . Sleep apnea    cpap  . TIA (transient ischemic attack)  09/14/2016  . Type 2 diabetes mellitus ([place])   . Type 2 diabetes mellitus with diabetic nephropathy ([place])   . Vitamin D deficiency, unspecified     Past Surgical History:  Procedure Laterality Date  . ABDOMINAL HYSTERECTOMY  1984  . BREAST LUMPECTOMY  1976   left, benign  . CHOLECYSTECTOMY  09/05/10  . CORONARY STENT INTERVENTION N/A 07/12/2018   Procedure: CORONARY STENT INTERVENTION;  Surgeon: Jettie Booze, MD;  Location: [place] [facility];  Service: Cardiovascular;  Laterality: N/A;  . CORONARY THROMBECTOMY N/A 07/12/2018   Procedure: Coronary Thrombectomy;  Surgeon: Jettie Booze, MD;  Location: [place] [facility];  Service: Cardiovascular;  Laterality: N/A;  . INTRAVASCULAR PRESSURE  WIRE/FFR STUDY N/A 01/21/2019   Procedure: INTRAVASCULAR PRESSURE WIRE/FFR STUDY;  Surgeon: Jettie Booze, MD;  Location: [place] [facility];  Service: Cardiovascular;  Laterality: N/A;  . LEFT HEART CATH AND CORONARY ANGIOGRAPHY N/A 07/12/2018   Procedure: LEFT HEART CATH AND CORONARY ANGIOGRAPHY;  Surgeon: Jettie Booze, MD;  Location: [place] [facility];  Service: Cardiovascular;  Laterality: N/A;  . LEFT HEART CATH AND CORONARY ANGIOGRAPHY N/A 01/21/2019   Procedure: LEFT HEART CATH AND CORONARY ANGIOGRAPHY;  Surgeon: Jettie Booze, MD;  Location: [place] [facility];  Service: Cardiovascular;  Laterality: N/A;     Current Medications, Allergies, Past Medical History, Past Surgical History, Family History and Social History were reviewed in [employer] record.   Physical Exam: There were no vitals taken for this visit. General: Well developed, w   ***female in no acute distress. Head: Normocephalic and atraumatic. Eyes: No scleral icterus. Conjunctiva pink . Ears: Normal auditory acuity. Mouth: Dentition intact. No ulcers or lesions.  Lungs: Clear throughout to auscultation. Heart: Regular rate and rhythm, no murmur. Abdomen: Soft, nontender and nondistended. No masses or hepatomegaly. Normal bowel sounds x 4 quadrants.  Rectal: *** Musculoskeletal: Symmetrical with no gross deformities. Extremities: No edema. Neurological: Alert oriented x 4. No focal deficits.  Psychological: Alert and cooperative. Normal mood and affect  Assessment and Recommendations: ***

## 2020-02-10 ENCOUNTER — Ambulatory Visit: Payer: Medicare Other | Admitting: Nurse Practitioner

## 2020-02-12 NOTE — Telephone Encounter (Signed)
No follow up required.

## 2020-03-05 DIAGNOSIS — U071 COVID-19: Secondary | ICD-10-CM | POA: Diagnosis not present

## 2020-03-05 DIAGNOSIS — Z20828 Contact with and (suspected) exposure to other viral communicable diseases: Secondary | ICD-10-CM | POA: Diagnosis not present

## 2020-03-11 DIAGNOSIS — J019 Acute sinusitis, unspecified: Secondary | ICD-10-CM | POA: Diagnosis not present

## 2020-03-11 DIAGNOSIS — Z20828 Contact with and (suspected) exposure to other viral communicable diseases: Secondary | ICD-10-CM | POA: Diagnosis not present

## 2020-03-11 DIAGNOSIS — D519 Vitamin B12 deficiency anemia, unspecified: Secondary | ICD-10-CM | POA: Diagnosis not present

## 2020-03-11 DIAGNOSIS — I2510 Atherosclerotic heart disease of native coronary artery without angina pectoris: Secondary | ICD-10-CM | POA: Diagnosis not present

## 2020-03-11 DIAGNOSIS — R030 Elevated blood-pressure reading, without diagnosis of hypertension: Secondary | ICD-10-CM | POA: Diagnosis not present

## 2020-03-11 DIAGNOSIS — E559 Vitamin D deficiency, unspecified: Secondary | ICD-10-CM | POA: Diagnosis not present

## 2020-03-11 DIAGNOSIS — U071 COVID-19: Secondary | ICD-10-CM | POA: Diagnosis not present

## 2020-03-17 DIAGNOSIS — R030 Elevated blood-pressure reading, without diagnosis of hypertension: Secondary | ICD-10-CM | POA: Diagnosis not present

## 2020-04-13 DIAGNOSIS — E1140 Type 2 diabetes mellitus with diabetic neuropathy, unspecified: Secondary | ICD-10-CM | POA: Diagnosis not present

## 2020-04-13 DIAGNOSIS — D513 Other dietary vitamin B12 deficiency anemia: Secondary | ICD-10-CM | POA: Diagnosis not present

## 2020-04-13 DIAGNOSIS — I10 Essential (primary) hypertension: Secondary | ICD-10-CM | POA: Diagnosis not present

## 2020-04-13 DIAGNOSIS — Z23 Encounter for immunization: Secondary | ICD-10-CM | POA: Diagnosis not present

## 2020-04-13 DIAGNOSIS — I2510 Atherosclerotic heart disease of native coronary artery without angina pectoris: Secondary | ICD-10-CM | POA: Diagnosis not present

## 2020-04-13 DIAGNOSIS — D519 Vitamin B12 deficiency anemia, unspecified: Secondary | ICD-10-CM | POA: Diagnosis not present

## 2020-04-23 DIAGNOSIS — D513 Other dietary vitamin B12 deficiency anemia: Secondary | ICD-10-CM | POA: Diagnosis not present

## 2020-04-26 DIAGNOSIS — Z1231 Encounter for screening mammogram for malignant neoplasm of breast: Secondary | ICD-10-CM | POA: Diagnosis not present

## 2020-04-26 DIAGNOSIS — Z20828 Contact with and (suspected) exposure to other viral communicable diseases: Secondary | ICD-10-CM | POA: Diagnosis not present

## 2020-04-26 DIAGNOSIS — U071 COVID-19: Secondary | ICD-10-CM | POA: Diagnosis not present

## 2020-05-20 DIAGNOSIS — D513 Other dietary vitamin B12 deficiency anemia: Secondary | ICD-10-CM | POA: Diagnosis not present

## 2020-06-11 DIAGNOSIS — E663 Overweight: Secondary | ICD-10-CM | POA: Diagnosis not present

## 2020-06-11 DIAGNOSIS — R5382 Chronic fatigue, unspecified: Secondary | ICD-10-CM | POA: Diagnosis not present

## 2020-06-11 DIAGNOSIS — Z6829 Body mass index (BMI) 29.0-29.9, adult: Secondary | ICD-10-CM | POA: Diagnosis not present

## 2020-06-11 DIAGNOSIS — M25561 Pain in right knee: Secondary | ICD-10-CM | POA: Diagnosis not present

## 2020-06-11 DIAGNOSIS — M0579 Rheumatoid arthritis with rheumatoid factor of multiple sites without organ or systems involvement: Secondary | ICD-10-CM | POA: Diagnosis not present

## 2020-06-11 DIAGNOSIS — M150 Primary generalized (osteo)arthritis: Secondary | ICD-10-CM | POA: Diagnosis not present

## 2020-06-15 DIAGNOSIS — E785 Hyperlipidemia, unspecified: Secondary | ICD-10-CM | POA: Insufficient documentation

## 2020-06-18 ENCOUNTER — Ambulatory Visit: Payer: PPO | Admitting: Cardiology

## 2020-06-18 ENCOUNTER — Encounter: Payer: Self-pay | Admitting: Cardiology

## 2020-06-18 ENCOUNTER — Other Ambulatory Visit: Payer: Self-pay

## 2020-06-18 VITALS — BP 144/88 | HR 72 | Ht 65.0 in | Wt 174.8 lb

## 2020-06-18 DIAGNOSIS — I10 Essential (primary) hypertension: Secondary | ICD-10-CM | POA: Diagnosis not present

## 2020-06-18 DIAGNOSIS — E1121 Type 2 diabetes mellitus with diabetic nephropathy: Secondary | ICD-10-CM

## 2020-06-18 DIAGNOSIS — I2510 Atherosclerotic heart disease of native coronary artery without angina pectoris: Secondary | ICD-10-CM | POA: Diagnosis not present

## 2020-06-18 DIAGNOSIS — F17210 Nicotine dependence, cigarettes, uncomplicated: Secondary | ICD-10-CM | POA: Diagnosis not present

## 2020-06-18 DIAGNOSIS — E782 Mixed hyperlipidemia: Secondary | ICD-10-CM

## 2020-06-18 DIAGNOSIS — G4733 Obstructive sleep apnea (adult) (pediatric): Secondary | ICD-10-CM | POA: Diagnosis not present

## 2020-06-18 MED ORDER — NITROGLYCERIN 0.4 MG SL SUBL: 0.4000 mg | SUBLINGUAL_TABLET | SUBLINGUAL | 12 refills | Status: DC | PRN

## 2020-06-18 MED ORDER — METOPROLOL SUCCINATE ER 25 MG PO TB24: 25.0000 mg | ORAL_TABLET | Freq: Every day | ORAL | 3 refills | Status: DC

## 2020-06-18 MED ORDER — BUPROPION HCL ER (SMOKING DET) 150 MG PO TB12: 150.0000 mg | ORAL_TABLET | Freq: Two times a day (BID) | ORAL | 3 refills | Status: DC

## 2020-06-18 MED ORDER — ROSUVASTATIN CALCIUM 10 MG PO TABS: ORAL_TABLET | ORAL | 2 refills | Status: DC

## 2020-06-18 MED ORDER — ASPIRIN EC 81 MG PO TBEC: 81.0000 mg | DELAYED_RELEASE_TABLET | Freq: Every day | ORAL | 3 refills | Status: DC

## 2020-06-18 NOTE — Progress Notes (Signed)
Cardiology Office Note:    Date:  06/18/2020   ID:  Brenda Cochran, DOB Aug 24, 1950, MRN [id number]  PCP:  Imagene Riches, NP  Cardiologist:  Jenean Lindau, MD   Referring MD: Rochel Brome, MD    ASSESSMENT:    1. Atherosclerosis of native coronary artery of native heart without angina pectoris   2. Essential hypertension   3. Mixed hyperlipidemia   4. Type 2 diabetes mellitus with diabetic nephropathy, without long-term current use of insulin ([place])   5. OSA (obstructive sleep apnea)   6. Continuous dependence on cigarette smoking    PLAN:    In order of problems listed above:  1. Coronary artery disease: Secondary prevention stressed with patient.  Importance of compliance with diet and medications stressed and she vocalized understanding.  I will resume all her medications.  She does not need Brilinta at this time.  She will have baseline blood work today.  Importance of regular exercise stressed and she promises to do better. 2. Cigarette smoker: I spent 5 minutes with the patient discussing solely about smoking. Smoking cessation was counseled. I suggested to the patient also different medications and pharmacological interventions. Patient is keen to try stopping on its own at this time. He will get back to me if he needs any further assistance in this matter. 3. Essential hypertension: Blood pressure stable and diet was emphasized. 4. Mixed dyslipidemia: Lipids will be reviewed today and I will initiate her on statin therapy.  I will reinitiate her therapy. 5. Diabetes mellitus obstructive sleep apnea obesity: I discussed lifestyle modification with her extensively and then she promises to do better.  Risks of obesity explained and she promises to lose weight. 6. Patient will be seen in follow-up appointment in 6 months or earlier if the patient has any concerns    Medication Adjustments/Labs and Tests Ordered: Current medicines are reviewed at length with the patient today.   Concerns regarding medicines are outlined above.  No orders of the defined types were placed in this encounter.  No orders of the defined types were placed in this encounter.    No chief complaint on file.    History of Present Illness:    Brenda Cochran is a 70 y.o. female.  Patient has past medical history of coronary artery disease, essential hypertension dyslipidemia diabetes mellitus and obesity.  Overall she leads a sedentary lifestyle and continues to smoke.  She lost her husband recently.  She is in the grieving process but handling it well.  She also raises her granddaughter who she is responsible for.  At the time of my evaluation, the patient is alert awake oriented and in no distress.  She mentions to me that she has stopped taking her medicine over the past several weeks.  Past Medical History:  Diagnosis Date  . Angina pectoris ([place]) 01/10/2019  . Asthma   . Asthma, chronic obstructive, with acute exacerbation ([place])    [name] 01/13/2014  FEV1 76% predicted FVC 77% predicted FEV1 to FVC ratio 100% predicted FEF 25 7569% predicted CXR 01/13/14: NAD Alpha one antitrypsin 156  IgE 56 sl elevated.  RAST neg    . Atherosclerotic heart disease of native coronary artery without angina pectoris   . CAD (coronary artery disease) 09/23/2018  . Chronic maxillary sinusitis   . Chronic tension-type headache, intractable 08/07/2019  . Cigarette nicotine dependence with nicotine-induced disorder 08/18/2019  . Continuous dependence on cigarette smoking 06/09/2019  . COPD (chronic obstructive pulmonary  disease) ([place])   . Coronary artery disease involving native coronary artery of native heart without angina pectoris 07/25/2018  . Cough 10/29/2019  . Depression   . Dyslipidemia associated with type 2 diabetes mellitus ([place]) 08/23/2018  . Essential hypertension 07/18/2018  . Fibromyalgia   . GERD (gastroesophageal reflux disease)   . Headache   . Hyperlipidemia   . Hypertensive heart disease without  heart failure   . Hypothyroidism (acquired)   . Loculated pleural effusion 12/30/2019  . Major depressive disorder, single episode, mild ([place])   . Menopausal and female climacteric states   . Mixed hyperlipidemia   . Nicotine dependence, cigarettes, with unspecified nicotine-induced disorders   . NSTEMI (non-ST elevated myocardial infarction) ([place]) 07/12/2018  . Numbness and tingling in left upper extremity   . OSA (obstructive sleep apnea) 01/13/2014   Sleep study 02/19/14 : AHI 5.5.  Mild sleep apnea.  Lowest SpO2 81%   . Osteoarthritis   . Overweight   . Pain in right knee 07/04/2017  . Palpitations 08/15/2019  . Pleural effusion, not elsewhere classified 12/30/2019  . Pneumonia due to COVID-19 virus 01/06/2020  . RA (rheumatoid arthritis) ([place])    [name]  . Shortness of breath 10/29/2019  . Skin cancer    face  . Sleep apnea    cpap  . TIA (transient ischemic attack) 09/14/2016  . Type 2 diabetes mellitus ([place])   . Type 2 diabetes mellitus with diabetic nephropathy ([place])   . Vitamin D deficiency, unspecified     Past Surgical History:  Procedure Laterality Date  . ABDOMINAL HYSTERECTOMY  1984  . BREAST LUMPECTOMY  1976   left, benign  . CHOLECYSTECTOMY  09/05/10  . CORONARY STENT INTERVENTION N/A 07/12/2018   Procedure: CORONARY STENT INTERVENTION;  Surgeon: Jettie Booze, MD;  Location: [facility];  Service: Cardiovascular;  Laterality: N/A;  . CORONARY THROMBECTOMY N/A 07/12/2018   Procedure: Coronary Thrombectomy;  Surgeon: Jettie Booze, MD;  Location: [facility];  Service: Cardiovascular;  Laterality: N/A;  . INTRAVASCULAR PRESSURE WIRE/FFR STUDY N/A 01/21/2019   Procedure: INTRAVASCULAR PRESSURE WIRE/FFR STUDY;  Surgeon: Jettie Booze, MD;  Location: [facility];  Service: Cardiovascular;  Laterality: N/A;  . LEFT HEART CATH AND CORONARY ANGIOGRAPHY N/A 07/12/2018   Procedure: LEFT HEART CATH AND CORONARY ANGIOGRAPHY;  Surgeon:  Jettie Booze, MD;  Location: [facility];  Service: Cardiovascular;  Laterality: N/A;  . LEFT HEART CATH AND CORONARY ANGIOGRAPHY N/A 01/21/2019   Procedure: LEFT HEART CATH AND CORONARY ANGIOGRAPHY;  Surgeon: Jettie Booze, MD;  Location: [facility];  Service: Cardiovascular;  Laterality: N/A;    Current Medications: Current Meds  Medication Sig  . albuterol (VENTOLIN HFA) 108 (90 Base) MCG/ACT inhaler Inhale 1-2 puffs into the lungs every 6 (six) hours as needed for wheezing or shortness of breath.  Marland Kitchen aspirin EC 81 MG tablet Take 81 mg by mouth daily.  Marland Kitchen leflunomide (ARAVA) 20 MG tablet Take 20 mg by mouth daily.  . nitroGLYCERIN (NITROSTAT) 0.4 MG SL tablet Place 1 tablet (0.4 mg total) under the tongue every 5 (five) minutes x 3 doses as needed for chest pain.     Allergies:   Shellfish allergy, Contrast media [iodinated diagnostic agents], Metformin and related, Amitriptyline hcl, Cymbalta [duloxetine hcl], Ultram [tramadol], and Oxycontin [oxycodone hcl]   Social History   Socioeconomic History  . Marital status: Married    Spouse name: Not on  file  . Number of children: 6  . Years of education: Not on file  . Highest education level: Not on file  Occupational History  . Occupation: disabled    Comment: jockey  Tobacco Use  . Smoking status: Current Every Day Smoker    Packs/day: 4.00    Years: 30.00    Pack years: 120.00    Types: Cigarettes  . Smokeless tobacco: Never Used  . Tobacco comment: now smoking 1/2 pack a day 7/27 ep  Vaping Use  . Vaping Use: Never used  Substance and Sexual Activity  . Alcohol use: No  . Drug use: No  . Sexual activity: Not on file  Other Topics Concern  . Not on file  Social History Narrative   Lives with Aleighna Wojtas husband.   Disabled   Social Determinants of Health   Financial Resource Strain: Not on file  Food Insecurity: Not on file  Transportation Needs: Not on file  Physical Activity: Not on  file  Stress: Not on file  Social Connections: Not on file     Family History: The patient's family history includes Bone cancer in her father; Breast cancer in her sister; CAD in an other family member; COPD in her brother; Diabetes in an other family member; Hypertension in an other family member; Lung cancer in her father; Lung disease in her maternal uncle, maternal uncle, and mother; Lymphoma in her sister; Multiple sclerosis in her brother; Muscular dystrophy in her brother; Seizures in an other family member. There is no history of Colon cancer, Colon polyps, Esophageal cancer, Stomach cancer, or Rectal cancer.  ROS:   Please see the history of present illness.    All other systems reviewed and are negative.  EKGs/Labs/Other Studies Reviewed:    The following studies were reviewed today: I discussed my findings with the patient at length and [facility] hospital records were reviewed.  She had COVID-19 infection and her husband succumbed to it.   Recent Labs: 08/15/2019: TSH 1.570 10/29/2019: NT-Pro BNP 75 10/30/2019: Hemoglobin 13.0; Platelets 351 11/06/2019: ALT 12; BUN 9; Creatinine, Ser 0.65; Potassium 3.5; Sodium 136  Recent Lipid Panel    Component Value Date/Time   CHOL 118 08/15/2019 0745   TRIG 72 08/15/2019 0745   HDL 46 08/15/2019 0745   CHOLHDL 2.6 08/15/2019 0745   CHOLHDL 5.7 07/12/2018 0333   VLDL 29 07/12/2018 0333   LDLCALC 57 08/15/2019 0745    Physical Exam:    VS:  BP (!) 144/88   Pulse 72   Ht 5\' 5"  (1.651 m)   Wt 174 lb 12.8 oz (79.3 kg)   SpO2 97%   BMI 29.09 kg/m     Wt Readings from Last 3 Encounters:  06/18/20 174 lb 12.8 oz (79.3 kg)  01/01/20 177 lb (80.3 kg)  12/30/19 178 lb (80.7 kg)     GEN: Patient is in no acute distress HEENT: Normal NECK: No JVD; No carotid bruits LYMPHATICS: No lymphadenopathy CARDIAC: Hear sounds regular, 2/6 systolic murmur at the apex. RESPIRATORY:  Clear to auscultation without rales, wheezing or rhonchi   ABDOMEN: Soft, non-tender, non-distended MUSCULOSKELETAL:  No edema; No deformity  SKIN: Warm and dry NEUROLOGIC:  Alert and oriented x 3 PSYCHIATRIC:  Normal affect   Signed, Jenean Lindau, MD  06/18/2020 9:03 AM    [place]

## 2020-06-18 NOTE — Patient Instructions (Addendum)
Medication Instructions:  Your physician has recommended you make the following change in your medication:   Take 25 mg Toprol XL daily. Take 81 mg coated aspirin daily. Stop Brilinta Take Nitroglycerin as needed for chest pain Take Rosuvastatin 10 mg daily. Take Chantix   *If you need a refill on your cardiac medications before your next appointment, please call your pharmacy*   Lab Work: Your physician recommends that you have labs done in the office today. Your test included  basic metabolic panel, liver function and lipids.  Your physician recommends that you return for lab work in: before your next visit. You need to have labs done when you are fasting.  You can come Monday through Friday 8:30 am to 12:00 pm and 1:15 to 4:30. You do not need to make an appointment as the order has already been placed. The labs you are going to have done are BMET, TSH, LFT and Lipids.  If you have labs (blood work) drawn today and your tests are completely normal, you will receive your results only by: Marland Kitchen MyChart Message (if you have MyChart) OR . A paper copy in the mail If you have any lab test that is abnormal or we need to change your treatment, we will call you to review the results.   Testing/Procedures: None ordered   Follow-Up: At [facility], you and your health needs are our priority.  As part of our continuing mission to provide you with exceptional heart care, we have created designated Provider Care Teams.  These Care Teams include your primary Cardiologist (physician) and Advanced Practice Providers (APPs -  Physician Assistants and Nurse Practitioners) who all work together to provide you with the care you need, when you need it.  We recommend signing up for the patient portal called "MyChart".  Sign up information is provided on this After Visit Summary.  MyChart is used to connect with patients for Virtual Visits (Telemedicine).  Patients are able to view lab/test results,  encounter notes, upcoming appointments, etc.  Non-urgent messages can be sent to your provider as well.   To learn more about what you can do with MyChart, go to [email or web address].    Your next appointment:   2 month(s)  The format for your next appointment:   In Person  Provider:   Jyl Heinz, MD   Other Instructions NA

## 2020-06-19 LAB — LIPID PANEL
Chol/HDL Ratio: 5.1 ratio — ABNORMAL HIGH (ref 0.0–4.4)
Cholesterol, Total: 214 mg/dL — ABNORMAL HIGH (ref 100–199)
HDL: 42 mg/dL (ref >39)
LDL Chol Calc (NIH): 125 mg/dL — ABNORMAL HIGH (ref 0–99)
Triglycerides: 268 mg/dL — ABNORMAL HIGH (ref 0–149)
VLDL Cholesterol Cal: 47 mg/dL — ABNORMAL HIGH (ref 5–40)

## 2020-06-19 LAB — HEPATIC FUNCTION PANEL
ALT: 12 IU/L (ref 0–32)
AST: 10 IU/L (ref 0–40)
Albumin: 3.8 g/dL (ref 3.8–4.8)
Alkaline Phosphatase: 141 IU/L — ABNORMAL HIGH (ref 44–121)
Bilirubin Total: 0.4 mg/dL (ref 0.0–1.2)
Bilirubin, Direct: 0.1 mg/dL (ref 0.00–0.40)
Total Protein: 6.6 g/dL (ref 6.0–8.5)

## 2020-06-19 LAB — BASIC METABOLIC PANEL
BUN/Creatinine Ratio: 15 (ref 12–28)
BUN: 10 mg/dL (ref 8–27)
CO2: 26 mmol/L (ref 20–29)
Calcium: 9.6 mg/dL (ref 8.7–10.3)
Chloride: 97 mmol/L (ref 96–106)
Creatinine, Ser: 0.65 mg/dL (ref 0.57–1.00)
GFR calc Af Amer: 105 mL/min/{1.73_m2} (ref >59)
GFR calc non Af Amer: 92 mL/min/{1.73_m2} (ref >59)
Glucose: 271 mg/dL — ABNORMAL HIGH (ref 65–99)
Potassium: 4.5 mmol/L (ref 3.5–5.2)
Sodium: 137 mmol/L (ref 134–144)

## 2020-06-23 NOTE — Addendum Note (Signed)
Addended by: Truddie Hidden on: 06/23/2020 04:06 PM   Modules accepted: Orders

## 2020-06-29 DIAGNOSIS — Z20828 Contact with and (suspected) exposure to other viral communicable diseases: Secondary | ICD-10-CM | POA: Diagnosis not present

## 2020-06-29 DIAGNOSIS — U071 COVID-19: Secondary | ICD-10-CM | POA: Diagnosis not present

## 2020-07-07 IMAGING — DX DG CHEST 2V
2 series · 2 of 2 positions shown · non-contrast
Comparison: 12/03/2019, CT 10/30/2019

CLINICAL DATA: Pleural effusion

EXAM:
CHEST - 2 VIEW

[chest pa]
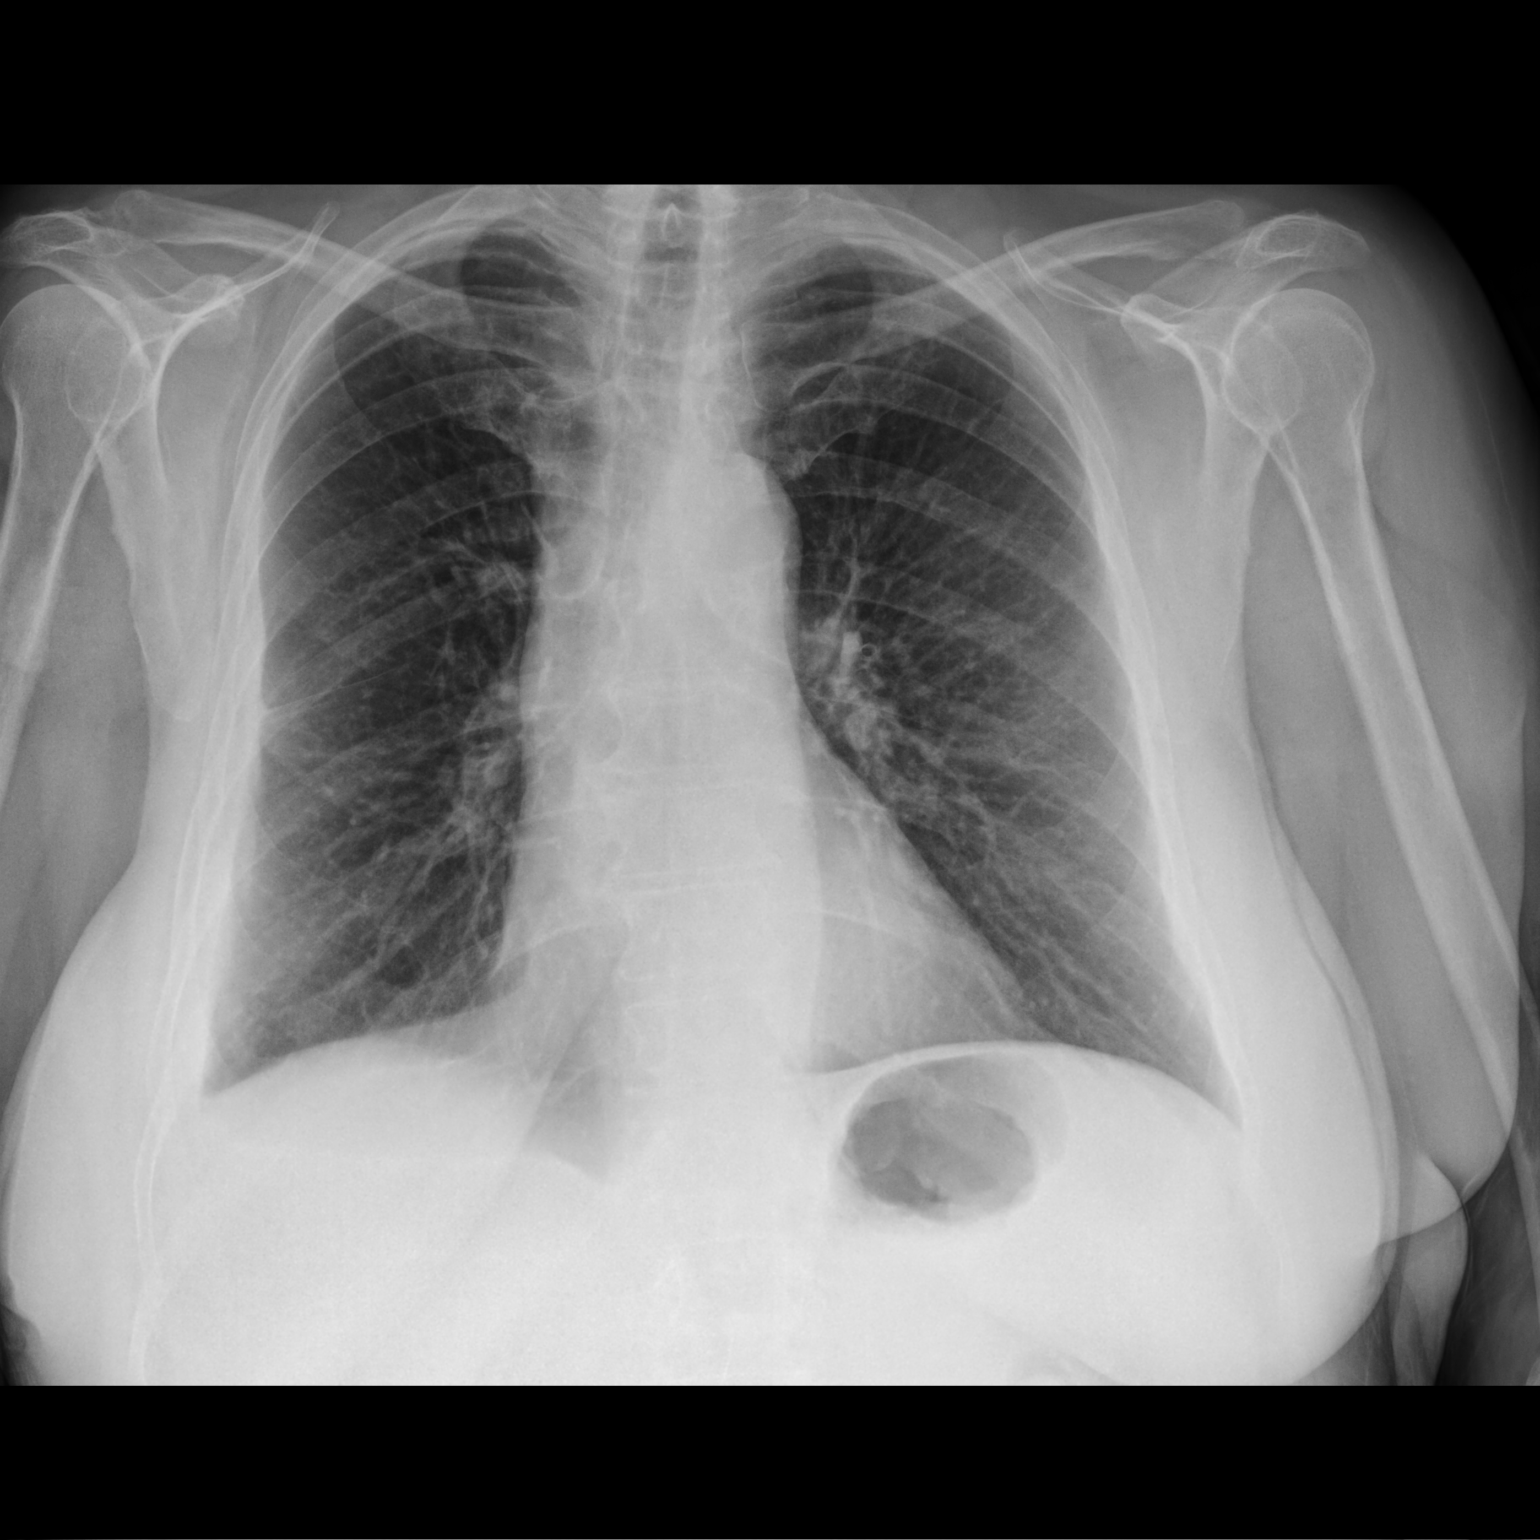

[chest lat]
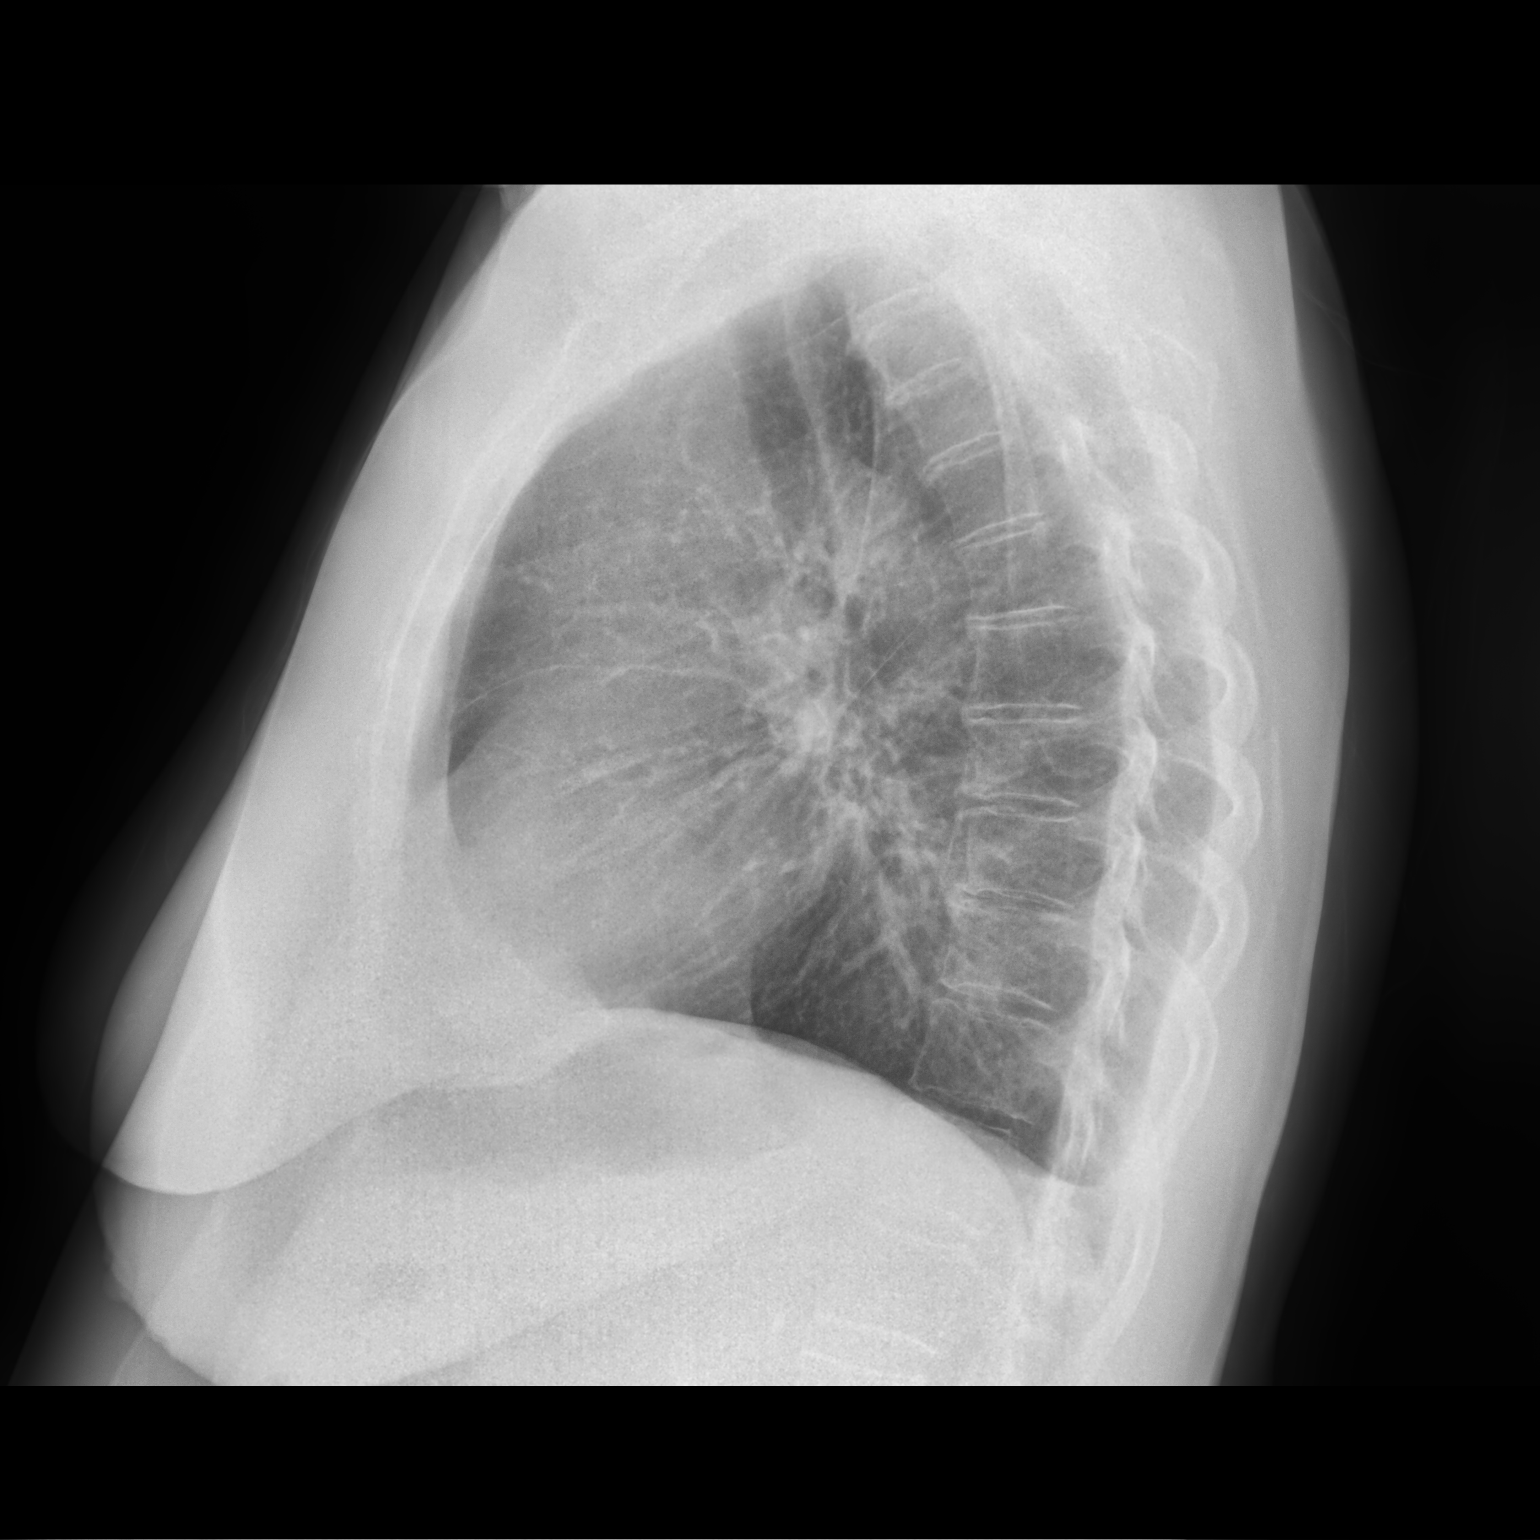

[2 of 2 positions shown; findings below may reference images not displayed]

FINDINGS: Small right pleural effusion continues to decrease since prior
examination. The lungs are clear. No pneumothorax. No pleural
effusion on the left. Cardiac size within normal limits. Osseous
structures are age-appropriate. Pulmonary vascularity is normal.
IMPRESSION: Progressive decrease in small right pleural effusion.

## 2020-07-12 DIAGNOSIS — D519 Vitamin B12 deficiency anemia, unspecified: Secondary | ICD-10-CM | POA: Diagnosis not present

## 2020-07-12 DIAGNOSIS — E1140 Type 2 diabetes mellitus with diabetic neuropathy, unspecified: Secondary | ICD-10-CM | POA: Diagnosis not present

## 2020-07-12 DIAGNOSIS — Z1339 Encounter for screening examination for other mental health and behavioral disorders: Secondary | ICD-10-CM | POA: Diagnosis not present

## 2020-07-12 DIAGNOSIS — E559 Vitamin D deficiency, unspecified: Secondary | ICD-10-CM | POA: Diagnosis not present

## 2020-07-12 DIAGNOSIS — D513 Other dietary vitamin B12 deficiency anemia: Secondary | ICD-10-CM | POA: Diagnosis not present

## 2020-07-12 DIAGNOSIS — I10 Essential (primary) hypertension: Secondary | ICD-10-CM | POA: Diagnosis not present

## 2020-07-12 DIAGNOSIS — R8290 Unspecified abnormal findings in urine: Secondary | ICD-10-CM | POA: Diagnosis not present

## 2020-07-30 DIAGNOSIS — Z712 Person consulting for explanation of examination or test findings: Secondary | ICD-10-CM | POA: Diagnosis not present

## 2020-07-30 DIAGNOSIS — E1140 Type 2 diabetes mellitus with diabetic neuropathy, unspecified: Secondary | ICD-10-CM | POA: Diagnosis not present

## 2020-08-26 ENCOUNTER — Other Ambulatory Visit: Payer: Self-pay

## 2020-08-27 ENCOUNTER — Encounter: Payer: Self-pay | Admitting: Cardiology

## 2020-08-27 ENCOUNTER — Ambulatory Visit (INDEPENDENT_AMBULATORY_CARE_PROVIDER_SITE_OTHER): Payer: PPO

## 2020-08-27 ENCOUNTER — Ambulatory Visit: Payer: PPO | Admitting: Cardiology

## 2020-08-27 ENCOUNTER — Other Ambulatory Visit: Payer: Self-pay

## 2020-08-27 VITALS — BP 162/88 | HR 68 | Ht 65.0 in | Wt 179.0 lb

## 2020-08-27 DIAGNOSIS — R002 Palpitations: Secondary | ICD-10-CM | POA: Diagnosis not present

## 2020-08-27 DIAGNOSIS — E1121 Type 2 diabetes mellitus with diabetic nephropathy: Secondary | ICD-10-CM

## 2020-08-27 DIAGNOSIS — I10 Essential (primary) hypertension: Secondary | ICD-10-CM | POA: Diagnosis not present

## 2020-08-27 DIAGNOSIS — E039 Hypothyroidism, unspecified: Secondary | ICD-10-CM | POA: Diagnosis not present

## 2020-08-27 DIAGNOSIS — I2510 Atherosclerotic heart disease of native coronary artery without angina pectoris: Secondary | ICD-10-CM

## 2020-08-27 DIAGNOSIS — E1169 Type 2 diabetes mellitus with other specified complication: Secondary | ICD-10-CM | POA: Diagnosis not present

## 2020-08-27 DIAGNOSIS — E782 Mixed hyperlipidemia: Secondary | ICD-10-CM

## 2020-08-27 DIAGNOSIS — E785 Hyperlipidemia, unspecified: Secondary | ICD-10-CM | POA: Diagnosis not present

## 2020-08-27 NOTE — Patient Instructions (Addendum)
Medication Instructions:  No changes.  *If you need a refill on your cardiac medications before your next appointment, please call your pharmacy*   Lab Work: Your physician recommends that you return for lab work in: Monday You need to have labs done when you are fasting.  You can come Monday through Friday 8:30 am to 12:00 pm and 1:15 to 4:30. You do not need to make an appointment as the order has already been placed. The labs you are going to have done are BMET, TSH, LFT and Lipids.  If you have labs (blood work) drawn today and your tests are completely normal, you will receive your results only by: Marland Kitchen MyChart Message (if you have MyChart) OR . A paper copy in the mail If you have any lab test that is abnormal or we need to change your treatment, we will call you to review the results.   Testing/Procedures:  WHY IS MY DOCTOR PRESCRIBING ZIO? The Zio system is proven and trusted by physicians to detect and diagnose irregular heart rhythms -- and has been prescribed to hundreds of thousands of patients.  The FDA has cleared the Zio system to monitor for many different kinds of irregular heart rhythms. In a study, physicians were able to reach a diagnosis 90% of the time with the Zio system1.  You can wear the Zio monitor -- a small, discreet, comfortable patch -- during your normal day-to-day activity, including while you sleep, shower, and exercise, while it records every single heartbeat for analysis.  1Barrett, P., et al. Comparison of 24 Hour Holter Monitoring Versus 14 Day Novel Adhesive Patch Electrocardiographic Monitoring. [place], 2014.  ZIO VS. HOLTER MONITORING The Zio monitor can be comfortably worn for up to 14 days. Holter monitors can be worn for 24 to 48 hours, limiting the time to record any irregular heart rhythms you may have. Zio is able to capture data for the 51% of patients who have their first symptom-triggered arrhythmia after 48  hours.1  LIVE WITHOUT RESTRICTIONS The Zio ambulatory cardiac monitor is a small, unobtrusive, and water-resistant patch--you might even forget you're wearing it. The Zio monitor records and stores every beat of your heart, whether you're sleeping, working out, or showering. Wear the monitor for 2 weeks, remove 09/10/20.  Follow-Up: At [facility], you and your health needs are our priority.  As part of our continuing mission to provide you with exceptional heart care, we have created designated Provider Care Teams.  These Care Teams include your primary Cardiologist (physician) and Advanced Practice Providers (APPs -  Physician Assistants and Nurse Practitioners) who all work together to provide you with the care you need, when you need it.  We recommend signing up for the patient portal called "MyChart".  Sign up information is provided on this After Visit Summary.  MyChart is used to connect with patients for Virtual Visits (Telemedicine).  Patients are able to view lab/test results, encounter notes, upcoming appointments, etc.  Non-urgent messages can be sent to your provider as well.   To learn more about what you can do with MyChart, go to [email or web address].    Your next appointment:   3 month(s)  The format for your next appointment:   In Person  Provider:   Jyl Heinz, MD   Other Instructions Icosapent ethyl capsules What is this medicine? ICOSAPENT ETHYL (eye KOE sa pent eth il) contains essential fats. It is used to treat high triglyceride levels. Diet and lifestyle changes are  often used with this drug. This medicine may be used for other purposes; ask your health care provider or pharmacist if you have questions. COMMON BRAND NAME(S): VASCEPA What should I tell my health care provider before I take this medicine? They need to know if you have any of these conditions:  bleeding disorders  liver disease  an unusual or allergic reaction to icosapent ethyl, fish,  shellfish, other medicines, foods, dyes, or preservatives  history of irregular heartbeat  pregnant or trying to get pregnant  breast-feeding How should I use this medicine? Take this medicine by mouth with a glass of water. Follow the directions on the prescription label. Take this medicine with food. Do not cut, crush, dissolve, or chew this medicine. Take your medicine at regular intervals. Do not take it more often than directed. Do not stop taking except on your doctor's advice. Talk to your pediatrician regarding the use of this medicine in children. Special care may be needed. Overdosage: If you think you have taken too much of this medicine contact a poison control center or emergency room at once. NOTE: This medicine is only for you. Do not share this medicine with others. What if I miss a dose? If you miss a dose, take it as soon as you can. If it is almost time for your next dose, take only that dose. Do not take double or extra doses. What may interact with this medicine? This medicine may interact with the following medications:  aspirin and aspirin-like medicines  beta-blockers like metoprolol and propranolol  certain medicines that treat or prevent blood clots like warfarin, enoxaparin, dalteparin, apixaban, dabigatran, and rivaroxaban  diuretics  female hormones, like estrogens and birth control pills This list may not describe all possible interactions. Give your health care provider a list of all the medicines, herbs, non-prescription drugs, or dietary supplements you use. Also tell them if you smoke, drink alcohol, or use illegal drugs. Some items may interact with your medicine. What should I watch for while using this medicine? You may need blood work done while you are taking this medicine. Follow a good diet and exercise plan. Taking this medicine does not replace a healthy lifestyle. Some foods that have omega-3 fatty acids naturally are fatty fish like albacore  tuna, halibut, herring, mackerel, lake trout, salmon, and sardines. If you are scheduled for any medical or dental procedure, tell your healthcare provider that you are taking this medicine. You may need to stop taking this medicine before the procedure. What side effects may I notice from receiving this medicine? Side effects that you should report to your doctor or health care professional as soon as possible:  allergic reactions like skin rash, itching or hives, swelling of the face, lips, or tongue  breathing problems  unusual bleeding or bruising  fast, irregular heartbeat Side effects that usually do not require medical attention (report to your doctor or health care professional if they continue or are bothersome):  muscle or joint pain  sore throat  swelling of the ankles, feet, hands  constipation  Gout This list may not describe all possible side effects. Call your doctor for medical advice about side effects. You may report side effects to FDA at 1-800-FDA-1088. Where should I keep my medicine? Keep out of the reach of children. Store at room temperature between 15 and 30 degrees C (59 and 86 degrees F). Throw away any unused medicine after the expiration date. NOTE: This sheet is a summary. It may  not cover all possible information. If you have questions about this medicine, talk to your doctor, pharmacist, or health care provider.  2021 Elsevier/Gold Standard (2018-05-21 18:35:46)

## 2020-08-27 NOTE — Progress Notes (Signed)
Cardiology Office Note:    Date:  08/27/2020   ID:  ARNECIA ECTOR, DOB April 17, 1951, MRN [id number]  PCP:  Imagene Riches, NP  Cardiologist:  Jenean Lindau, MD   Referring MD: Imagene Riches, NP    ASSESSMENT:    1. Hypothyroidism (acquired)   2. Dyslipidemia associated with type 2 diabetes mellitus ([place])   3. Type 2 diabetes mellitus with diabetic nephropathy, without long-term current use of insulin (HCC)   4. Palpitations   5. Coronary artery disease involving native coronary artery of native heart without angina pectoris   6. Mixed hyperlipidemia   7. Essential hypertension    PLAN:    In order of problems listed above:  1. Coronary artery disease: Secondary prevention stressed with the patient.  Importance of compliance with diet medication stressed and she vocalized understanding.  She has gained weight and has been noncompliant with exercise.  I told her to walk at least half an hour a day 5 days a week and she promises to do so. 2. Palpitations: I am concerned about her symptoms.  I told her to come back for blood work and we will do complete blood work including TSH in the next few days.  I will do a 2-week monitor to understand her symptoms. 3. Mixed dyslipidemia: Diet was emphasized.  She will be back in the next few days for blood work last time her cholesterol was elevated and I cautioned her against this.  Diet stressed. 4. Essential hypertension: Blood pressure stable and diet was emphasized. 5. History of smoking: Counseling given to refrain from smoking and she understands.Patient will be seen in follow-up appointment in 3 months or earlier if the patient has any concerns    Medication Adjustments/Labs and Tests Ordered: Current medicines are reviewed at length with the patient today.  Concerns regarding medicines are outlined above.  Orders Placed This Encounter  Procedures  . Lipid panel  . Hepatic function panel  . Basic metabolic panel  . TSH  . LONG  TERM MONITOR (3-14 DAYS)   No orders of the defined types were placed in this encounter.    No chief complaint on file.    History of Present Illness:    Brenda Cochran is a 70 y.o. female.  Patient has past medical history of coronary artery disease, essential hypertension and dyslipidemia.  She denies any problems at this time except palpitations once or twice a week.  No chest pain orthopnea or PND.  Time she leads a sedentary lifestyle  Past Medical History:  Diagnosis Date  . Angina pectoris ([place]) 01/10/2019  . Asthma   . Asthma, chronic obstructive, with acute exacerbation ([place])    [name] 01/13/2014  FEV1 76% predicted FVC 77% predicted FEV1 to FVC ratio 100% predicted FEF 25 7569% predicted CXR 01/13/14: NAD Alpha one antitrypsin 156  IgE 56 sl elevated.  RAST neg    . Atherosclerotic heart disease of native coronary artery without angina pectoris   . CAD (coronary artery disease) 09/23/2018  . Chronic maxillary sinusitis   . Chronic tension-type headache, intractable 08/07/2019  . Cigarette nicotine dependence with nicotine-induced disorder 08/18/2019  . Continuous dependence on cigarette smoking 06/09/2019  . COPD (chronic obstructive pulmonary disease) ([place])   . Coronary artery disease involving native coronary artery of native heart without angina pectoris 07/25/2018  . Cough 10/29/2019  . Depression   . Dyslipidemia associated with type 2 diabetes mellitus ([place]) 08/23/2018  . Essential hypertension 07/18/2018  .  Fibromyalgia   . GERD (gastroesophageal reflux disease)   . Headache   . Hyperlipidemia   . Hypertensive heart disease without heart failure   . Hypothyroidism (acquired)   . Loculated pleural effusion 12/30/2019  . Major depressive disorder, single episode, mild ([place])   . Menopausal and female climacteric states   . Mixed hyperlipidemia   . Nicotine dependence, cigarettes, with unspecified nicotine-induced disorders   . NSTEMI (non-ST elevated myocardial infarction)  ([place]) 07/12/2018  . Numbness and tingling in left upper extremity   . OSA (obstructive sleep apnea) 01/13/2014   Sleep study 02/19/14 : AHI 5.5.  Mild sleep apnea.  Lowest SpO2 81%   . Osteoarthritis   . Overweight   . Pain in right knee 07/04/2017  . Palpitations 08/15/2019  . Pleural effusion, not elsewhere classified 12/30/2019  . Pneumonia due to COVID-19 virus 01/06/2020  . RA (rheumatoid arthritis) ([place])    [name]  . Shortness of breath 10/29/2019  . Skin cancer    face  . Sleep apnea    cpap  . TIA (transient ischemic attack) 09/14/2016  . Type 2 diabetes mellitus ([place])   . Type 2 diabetes mellitus with diabetic nephropathy ([place])   . Vitamin D deficiency, unspecified     Past Surgical History:  Procedure Laterality Date  . ABDOMINAL HYSTERECTOMY  1984  . BREAST LUMPECTOMY  1976   left, benign  . CHOLECYSTECTOMY  09/05/10  . CORONARY STENT INTERVENTION N/A 07/12/2018   Procedure: CORONARY STENT INTERVENTION;  Surgeon: Jettie Booze, MD;  Location: [facility];  Service: Cardiovascular;  Laterality: N/A;  . CORONARY THROMBECTOMY N/A 07/12/2018   Procedure: Coronary Thrombectomy;  Surgeon: Jettie Booze, MD;  Location: [facility];  Service: Cardiovascular;  Laterality: N/A;  . INTRAVASCULAR PRESSURE WIRE/FFR STUDY N/A 01/21/2019   Procedure: INTRAVASCULAR PRESSURE WIRE/FFR STUDY;  Surgeon: Jettie Booze, MD;  Location: [facility];  Service: Cardiovascular;  Laterality: N/A;  . LEFT HEART CATH AND CORONARY ANGIOGRAPHY N/A 07/12/2018   Procedure: LEFT HEART CATH AND CORONARY ANGIOGRAPHY;  Surgeon: Jettie Booze, MD;  Location: [facility];  Service: Cardiovascular;  Laterality: N/A;  . LEFT HEART CATH AND CORONARY ANGIOGRAPHY N/A 01/21/2019   Procedure: LEFT HEART CATH AND CORONARY ANGIOGRAPHY;  Surgeon: Jettie Booze, MD;  Location: [facility];  Service: Cardiovascular;  Laterality: N/A;    Current  Medications: Current Meds  Medication Sig  . albuterol (VENTOLIN HFA) 108 (90 Base) MCG/ACT inhaler Inhale 1-2 puffs into the lungs every 6 (six) hours as needed for wheezing or shortness of breath.  Marland Kitchen aspirin EC 81 MG tablet Take 1 tablet (81 mg total) by mouth daily.  . Budeson-Glycopyrrol-Formoterol (BREZTRI AEROSPHERE) 160-9-4.8 MCG/ACT AERO Inhale 2 puffs into the lungs 2 (two) times daily.  Marland Kitchen buPROPion (ZYBAN) 150 MG 12 hr tablet Take 1 tablet (150 mg total) by mouth 2 (two) times daily.  Marland Kitchen JARDIANCE 25 MG TABS tablet Take 25 mg by mouth daily.  Marland Kitchen leflunomide (ARAVA) 20 MG tablet Take 20 mg by mouth daily.  . metoprolol succinate (TOPROL XL) 25 MG 24 hr tablet Take 1 tablet (25 mg total) by mouth daily.  . nitroGLYCERIN (NITROSTAT) 0.4 MG SL tablet Place 1 tablet (0.4 mg total) under the tongue every 5 (five) minutes x 3 doses as needed for chest pain.  . rosuvastatin (CRESTOR) 10 MG tablet TAKE 1 TABLET AT NIGHT FOR HIGH CHOLESTEROL  . WEEKLY-D  1.25 MG (50000 UT) capsule Take 50,000 Units by mouth once a week.     Allergies:   Shellfish allergy, Contrast media [iodinated diagnostic agents], Metformin and related, Amitriptyline hcl, Cymbalta [duloxetine hcl], Ultram [tramadol], and Oxycontin [oxycodone hcl]   Social History   Socioeconomic History  . Marital status: Married    Spouse name: Not on file  . Number of children: 6  . Years of education: Not on file  . Highest education level: Not on file  Occupational History  . Occupation: disabled    Comment: jockey  Tobacco Use  . Smoking status: Current Every Day Smoker    Packs/day: 4.00    Years: 30.00    Pack years: 120.00    Types: Cigarettes  . Smokeless tobacco: Never Used  . Tobacco comment: now smoking 1/2 pack a day 7/27 ep  Vaping Use  . Vaping Use: Never used  Substance and Sexual Activity  . Alcohol use: No  . Drug use: No  . Sexual activity: Not on file  Other Topics Concern  . Not on file  Social History  Narrative   Lives with Varie Machamer husband.   Disabled   Social Determinants of Health   Financial Resource Strain: Not on file  Food Insecurity: Not on file  Transportation Needs: Not on file  Physical Activity: Not on file  Stress: Not on file  Social Connections: Not on file     Family History: The patient's family history includes Bone cancer in her father; Breast cancer in her sister; CAD in an other family member; COPD in her brother; Diabetes in an other family member; Hypertension in an other family member; Lung cancer in her father; Lung disease in her maternal uncle, maternal uncle, and mother; Lymphoma in her sister; Multiple sclerosis in her brother; Muscular dystrophy in her brother; Seizures in an other family member. There is no history of Colon cancer, Colon polyps, Esophageal cancer, Stomach cancer, or Rectal cancer.  ROS:   Please see the history of present illness.    All other systems reviewed and are negative.  EKGs/Labs/Other Studies Reviewed:    The following studies were reviewed today: I discussed my findings with the patient and plan.   Recent Labs: 10/29/2019: NT-Pro BNP 75 10/30/2019: Hemoglobin 13.0; Platelets 351 06/18/2020: ALT 12; BUN 10; Creatinine, Ser 0.65; Potassium 4.5; Sodium 137  Recent Lipid Panel    Component Value Date/Time   CHOL 214 (H) 06/18/2020 0925   TRIG 268 (H) 06/18/2020 0925   HDL 42 06/18/2020 0925   CHOLHDL 5.1 (H) 06/18/2020 0925   CHOLHDL 5.7 07/12/2018 0333   VLDL 29 07/12/2018 0333   LDLCALC 125 (H) 06/18/2020 0925    Physical Exam:    VS:  BP (!) 162/88   Pulse 68   Ht 5\' 5"  (1.651 m)   Wt 179 lb (81.2 kg)   SpO2 97%   BMI 29.79 kg/m     Wt Readings from Last 3 Encounters:  08/27/20 179 lb (81.2 kg)  06/18/20 174 lb 12.8 oz (79.3 kg)  01/01/20 177 lb (80.3 kg)     GEN: Patient is in no acute distress HEENT: Normal NECK: No JVD; No carotid bruits LYMPHATICS: No lymphadenopathy CARDIAC: Hear sounds  regular, 2/6 systolic murmur at the apex. RESPIRATORY:  Clear to auscultation without rales, wheezing or rhonchi  ABDOMEN: Soft, non-tender, non-distended MUSCULOSKELETAL:  No edema; No deformity  SKIN: Warm and dry NEUROLOGIC:  Alert and oriented x 3 PSYCHIATRIC:  Normal affect   Signed, Jenean Lindau, MD  08/27/2020 2:24 PM    [place]

## 2020-08-27 NOTE — Addendum Note (Signed)
Addended by: Truddie Hidden on: 08/27/2020 02:52 PM   Modules accepted: Orders

## 2020-09-16 DIAGNOSIS — Z01818 Encounter for other preprocedural examination: Secondary | ICD-10-CM | POA: Diagnosis not present

## 2020-09-16 DIAGNOSIS — H2511 Age-related nuclear cataract, right eye: Secondary | ICD-10-CM | POA: Diagnosis not present

## 2020-09-16 DIAGNOSIS — H2512 Age-related nuclear cataract, left eye: Secondary | ICD-10-CM | POA: Diagnosis not present

## 2020-09-22 DIAGNOSIS — R002 Palpitations: Secondary | ICD-10-CM | POA: Diagnosis not present

## 2020-09-23 ENCOUNTER — Telehealth: Payer: Self-pay | Admitting: Cardiology

## 2020-09-23 NOTE — Telephone Encounter (Signed)
Patient called in to reschedule appt on 4/25 because she is having eye surgery on that day. Was unable to reschedule or cancel the appt. Please advise

## 2020-09-23 NOTE — Telephone Encounter (Signed)
Called and spoke with pt and advised her that she does not have an appointment. Pt verbalized understanding and had no additional questions.

## 2020-09-27 ENCOUNTER — Ambulatory Visit: Payer: PPO

## 2020-09-27 DIAGNOSIS — H2511 Age-related nuclear cataract, right eye: Secondary | ICD-10-CM | POA: Diagnosis not present

## 2020-09-27 DIAGNOSIS — R002 Palpitations: Secondary | ICD-10-CM

## 2020-09-27 DIAGNOSIS — E1121 Type 2 diabetes mellitus with diabetic nephropathy: Secondary | ICD-10-CM

## 2020-09-30 DIAGNOSIS — U071 COVID-19: Secondary | ICD-10-CM | POA: Diagnosis not present

## 2020-09-30 DIAGNOSIS — E119 Type 2 diabetes mellitus without complications: Secondary | ICD-10-CM | POA: Diagnosis not present

## 2020-09-30 DIAGNOSIS — D513 Other dietary vitamin B12 deficiency anemia: Secondary | ICD-10-CM | POA: Diagnosis not present

## 2020-09-30 DIAGNOSIS — Z20828 Contact with and (suspected) exposure to other viral communicable diseases: Secondary | ICD-10-CM | POA: Diagnosis not present

## 2020-09-30 DIAGNOSIS — E785 Hyperlipidemia, unspecified: Secondary | ICD-10-CM | POA: Diagnosis not present

## 2020-09-30 DIAGNOSIS — F4321 Adjustment disorder with depressed mood: Secondary | ICD-10-CM | POA: Diagnosis not present

## 2020-10-05 ENCOUNTER — Telehealth: Payer: Self-pay

## 2020-10-05 NOTE — Telephone Encounter (Signed)
Spoke with patient regarding results and recommendation.  Patient verbalizes understanding and is agreeable to plan of care. Advised patient to call back with any issues or concerns.  

## 2020-10-05 NOTE — Telephone Encounter (Signed)
-----   Message from Rajan R Revankar, MD sent at 10/05/2020  8:06 AM EDT ----- The results of the study is unremarkable. Please inform patient. I will discuss in detail at next appointment. Cc  primary care/referring physician Rajan R Revankar, MD 10/05/2020 8:06 AM  

## 2020-10-11 DIAGNOSIS — H2512 Age-related nuclear cataract, left eye: Secondary | ICD-10-CM | POA: Diagnosis not present

## 2020-11-04 DIAGNOSIS — J069 Acute upper respiratory infection, unspecified: Secondary | ICD-10-CM | POA: Diagnosis not present

## 2020-11-04 DIAGNOSIS — Z20822 Contact with and (suspected) exposure to covid-19: Secondary | ICD-10-CM | POA: Diagnosis not present

## 2020-11-07 DIAGNOSIS — J45901 Unspecified asthma with (acute) exacerbation: Secondary | ICD-10-CM | POA: Diagnosis not present

## 2020-11-07 DIAGNOSIS — J449 Chronic obstructive pulmonary disease, unspecified: Secondary | ICD-10-CM | POA: Diagnosis not present

## 2020-11-07 DIAGNOSIS — J209 Acute bronchitis, unspecified: Secondary | ICD-10-CM | POA: Diagnosis not present

## 2020-11-07 DIAGNOSIS — R0902 Hypoxemia: Secondary | ICD-10-CM | POA: Diagnosis not present

## 2020-11-09 ENCOUNTER — Emergency Department (HOSPITAL_COMMUNITY): Payer: PPO

## 2020-11-09 ENCOUNTER — Other Ambulatory Visit: Payer: Self-pay

## 2020-11-09 ENCOUNTER — Inpatient Hospital Stay (HOSPITAL_COMMUNITY)
Admission: EM | Admit: 2020-11-09 | Discharge: 2020-11-11 | DRG: 194 | Disposition: A | Discharge status: Home / Self Care | Payer: PPO | Attending: Internal Medicine | Admitting: Internal Medicine

## 2020-11-09 DIAGNOSIS — J440 Chronic obstructive pulmonary disease with acute lower respiratory infection: Secondary | ICD-10-CM | POA: Diagnosis present

## 2020-11-09 DIAGNOSIS — Z90710 Acquired absence of both cervix and uterus: Secondary | ICD-10-CM

## 2020-11-09 DIAGNOSIS — Z91013 Allergy to seafood: Secondary | ICD-10-CM

## 2020-11-09 DIAGNOSIS — I110 Hypertensive heart disease with heart failure: Secondary | ICD-10-CM | POA: Diagnosis not present

## 2020-11-09 DIAGNOSIS — I252 Old myocardial infarction: Secondary | ICD-10-CM

## 2020-11-09 DIAGNOSIS — F17210 Nicotine dependence, cigarettes, uncomplicated: Secondary | ICD-10-CM | POA: Diagnosis not present

## 2020-11-09 DIAGNOSIS — Z955 Presence of coronary angioplasty implant and graft: Secondary | ICD-10-CM

## 2020-11-09 DIAGNOSIS — J320 Chronic maxillary sinusitis: Secondary | ICD-10-CM | POA: Diagnosis present

## 2020-11-09 DIAGNOSIS — I517 Cardiomegaly: Secondary | ICD-10-CM | POA: Diagnosis not present

## 2020-11-09 DIAGNOSIS — Z825 Family history of asthma and other chronic lower respiratory diseases: Secondary | ICD-10-CM

## 2020-11-09 DIAGNOSIS — G4733 Obstructive sleep apnea (adult) (pediatric): Secondary | ICD-10-CM | POA: Diagnosis present

## 2020-11-09 DIAGNOSIS — E782 Mixed hyperlipidemia: Secondary | ICD-10-CM | POA: Diagnosis present

## 2020-11-09 DIAGNOSIS — D849 Immunodeficiency, unspecified: Secondary | ICD-10-CM | POA: Diagnosis present

## 2020-11-09 DIAGNOSIS — Z8249 Family history of ischemic heart disease and other diseases of the circulatory system: Secondary | ICD-10-CM

## 2020-11-09 DIAGNOSIS — Z85828 Personal history of other malignant neoplasm of skin: Secondary | ICD-10-CM | POA: Diagnosis not present

## 2020-11-09 DIAGNOSIS — E876 Hypokalemia: Secondary | ICD-10-CM | POA: Diagnosis not present

## 2020-11-09 DIAGNOSIS — J189 Pneumonia, unspecified organism: Secondary | ICD-10-CM

## 2020-11-09 DIAGNOSIS — Z803 Family history of malignant neoplasm of breast: Secondary | ICD-10-CM

## 2020-11-09 DIAGNOSIS — M549 Dorsalgia, unspecified: Secondary | ICD-10-CM | POA: Diagnosis present

## 2020-11-09 DIAGNOSIS — I5032 Chronic diastolic (congestive) heart failure: Secondary | ICD-10-CM | POA: Diagnosis not present

## 2020-11-09 DIAGNOSIS — E1165 Type 2 diabetes mellitus with hyperglycemia: Secondary | ICD-10-CM | POA: Diagnosis present

## 2020-11-09 DIAGNOSIS — Z820 Family history of epilepsy and other diseases of the nervous system: Secondary | ICD-10-CM

## 2020-11-09 DIAGNOSIS — I10 Essential (primary) hypertension: Secondary | ICD-10-CM | POA: Diagnosis not present

## 2020-11-09 DIAGNOSIS — Z8616 Personal history of COVID-19: Secondary | ICD-10-CM | POA: Diagnosis not present

## 2020-11-09 DIAGNOSIS — E039 Hypothyroidism, unspecified: Secondary | ICD-10-CM | POA: Diagnosis present

## 2020-11-09 DIAGNOSIS — E1121 Type 2 diabetes mellitus with diabetic nephropathy: Secondary | ICD-10-CM | POA: Diagnosis present

## 2020-11-09 DIAGNOSIS — J441 Chronic obstructive pulmonary disease with (acute) exacerbation: Secondary | ICD-10-CM | POA: Diagnosis not present

## 2020-11-09 DIAGNOSIS — Z20822 Contact with and (suspected) exposure to covid-19: Secondary | ICD-10-CM | POA: Diagnosis not present

## 2020-11-09 DIAGNOSIS — J122 Parainfluenza virus pneumonia: Principal | ICD-10-CM | POA: Diagnosis present

## 2020-11-09 DIAGNOSIS — I2510 Atherosclerotic heart disease of native coronary artery without angina pectoris: Secondary | ICD-10-CM | POA: Diagnosis not present

## 2020-11-09 DIAGNOSIS — Z9049 Acquired absence of other specified parts of digestive tract: Secondary | ICD-10-CM

## 2020-11-09 DIAGNOSIS — Z91041 Radiographic dye allergy status: Secondary | ICD-10-CM

## 2020-11-09 DIAGNOSIS — E559 Vitamin D deficiency, unspecified: Secondary | ICD-10-CM | POA: Diagnosis not present

## 2020-11-09 DIAGNOSIS — Z801 Family history of malignant neoplasm of trachea, bronchus and lung: Secondary | ICD-10-CM

## 2020-11-09 DIAGNOSIS — M797 Fibromyalgia: Secondary | ICD-10-CM | POA: Diagnosis not present

## 2020-11-09 DIAGNOSIS — I700 Atherosclerosis of aorta: Secondary | ICD-10-CM | POA: Diagnosis not present

## 2020-11-09 DIAGNOSIS — Z8673 Personal history of transient ischemic attack (TIA), and cerebral infarction without residual deficits: Secondary | ICD-10-CM | POA: Diagnosis not present

## 2020-11-09 DIAGNOSIS — M069 Rheumatoid arthritis, unspecified: Secondary | ICD-10-CM | POA: Diagnosis present

## 2020-11-09 DIAGNOSIS — Z7982 Long term (current) use of aspirin: Secondary | ICD-10-CM

## 2020-11-09 DIAGNOSIS — Z807 Family history of other malignant neoplasms of lymphoid, hematopoietic and related tissues: Secondary | ICD-10-CM

## 2020-11-09 DIAGNOSIS — Z885 Allergy status to narcotic agent status: Secondary | ICD-10-CM

## 2020-11-09 DIAGNOSIS — Z833 Family history of diabetes mellitus: Secondary | ICD-10-CM

## 2020-11-09 DIAGNOSIS — F32A Depression, unspecified: Secondary | ICD-10-CM | POA: Diagnosis not present

## 2020-11-09 DIAGNOSIS — J449 Chronic obstructive pulmonary disease, unspecified: Secondary | ICD-10-CM | POA: Diagnosis present

## 2020-11-09 DIAGNOSIS — E119 Type 2 diabetes mellitus without complications: Secondary | ICD-10-CM | POA: Diagnosis not present

## 2020-11-09 DIAGNOSIS — Z79899 Other long term (current) drug therapy: Secondary | ICD-10-CM

## 2020-11-09 DIAGNOSIS — Z8701 Personal history of pneumonia (recurrent): Secondary | ICD-10-CM

## 2020-11-09 DIAGNOSIS — G8929 Other chronic pain: Secondary | ICD-10-CM | POA: Diagnosis present

## 2020-11-09 DIAGNOSIS — E663 Overweight: Secondary | ICD-10-CM | POA: Diagnosis present

## 2020-11-09 DIAGNOSIS — R0602 Shortness of breath: Secondary | ICD-10-CM | POA: Diagnosis not present

## 2020-11-09 DIAGNOSIS — M1990 Unspecified osteoarthritis, unspecified site: Secondary | ICD-10-CM | POA: Diagnosis present

## 2020-11-09 DIAGNOSIS — Z888 Allergy status to other drugs, medicaments and biological substances status: Secondary | ICD-10-CM

## 2020-11-09 HISTORY — DX: Chronic obstructive pulmonary disease, unspecified: J44.9

## 2020-11-09 LAB — BASIC METABOLIC PANEL
Anion gap: 11 (ref 5–15)
BUN: 10 mg/dL (ref 8–23)
CO2: 24 mmol/L (ref 22–32)
Calcium: 8.9 mg/dL (ref 8.9–10.3)
Chloride: 99 mmol/L (ref 98–111)
Creatinine, Ser: 0.65 mg/dL (ref 0.44–1.00)
GFR, Estimated: >60 mL/min (ref >60)
Glucose, Bld: 279 mg/dL — ABNORMAL HIGH (ref 70–99)
Potassium: 3.4 mmol/L — ABNORMAL LOW (ref 3.5–5.1)
Sodium: 134 mmol/L — ABNORMAL LOW (ref 135–145)

## 2020-11-09 LAB — CBC WITH DIFFERENTIAL/PLATELET
Abs Immature Granulocytes: 0.02 10*3/uL (ref 0.00–0.07)
Basophils Absolute: 0 10*3/uL (ref 0.0–0.1)
Basophils Relative: 0 %
Eosinophils Absolute: 0.1 10*3/uL (ref 0.0–0.5)
Eosinophils Relative: 1 %
HCT: 46.8 % — ABNORMAL HIGH (ref 36.0–46.0)
Hemoglobin: 15.4 g/dL — ABNORMAL HIGH (ref 12.0–15.0)
Immature Granulocytes: 0 %
Lymphocytes Relative: 36 %
Lymphs Abs: 2.6 10*3/uL (ref 0.7–4.0)
MCH: 30.3 pg (ref 26.0–34.0)
MCHC: 32.9 g/dL (ref 30.0–36.0)
MCV: 91.9 fL (ref 80.0–100.0)
Monocytes Absolute: 0.7 10*3/uL (ref 0.1–1.0)
Monocytes Relative: 9 %
Neutro Abs: 3.8 10*3/uL (ref 1.7–7.7)
Neutrophils Relative %: 54 %
Platelets: 300 10*3/uL (ref 150–400)
RBC: 5.09 MIL/uL (ref 3.87–5.11)
RDW: 11.7 % (ref 11.5–15.5)
WBC: 7.1 10*3/uL (ref 4.0–10.5)
nRBC: 0 % (ref 0.0–0.2)

## 2020-11-09 LAB — BRAIN NATRIURETIC PEPTIDE: B Natriuretic Peptide: 32.5 pg/mL (ref 0.0–100.0)

## 2020-11-09 LAB — RESP PANEL BY RT-PCR (FLU A&B, COVID) ARPGX2
Influenza A by PCR: NEGATIVE
Influenza B by PCR: NEGATIVE
SARS Coronavirus 2 by RT PCR: NEGATIVE

## 2020-11-09 MED ORDER — NICOTINE 21 MG/24HR TD PT24
21.0000 mg | MEDICATED_PATCH | Freq: Every day | TRANSDERMAL | Status: DC
  Filled 2020-11-09 (×2): qty 1

## 2020-11-09 MED ORDER — SODIUM CHLORIDE 0.9% FLUSH: 3.0000 mL | Freq: Two times a day (BID) | INTRAVENOUS | Status: DC

## 2020-11-09 MED ORDER — IPRATROPIUM BROMIDE HFA 17 MCG/ACT IN AERS
2.0000 | INHALATION_SPRAY | Freq: Once | RESPIRATORY_TRACT | Status: AC
  Administered 2020-11-09: 2 via RESPIRATORY_TRACT
  Filled 2020-11-09: qty 12.9

## 2020-11-09 MED ORDER — PREDNISONE 20 MG PO TABS
60.0000 mg | ORAL_TABLET | Freq: Every day | ORAL | Status: DC
  Administered 2020-11-10 – 2020-11-11 (×2): 60 mg via ORAL
  Filled 2020-11-09 (×3): qty 3

## 2020-11-09 MED ORDER — SODIUM CHLORIDE 0.9 % IV SOLN
1.0000 g | Freq: Every day | INTRAVENOUS | Status: DC
  Administered 2020-11-10: 1 g via INTRAVENOUS
  Filled 2020-11-09: qty 10

## 2020-11-09 MED ORDER — PREDNISONE 20 MG PO TABS: 40.0000 mg | ORAL_TABLET | Freq: Once | ORAL | Status: DC

## 2020-11-09 MED ORDER — SODIUM CHLORIDE 0.9 % IV SOLN
100.0000 mg | Freq: Two times a day (BID) | INTRAVENOUS | Status: DC
  Administered 2020-11-10: 100 mg via INTRAVENOUS
  Filled 2020-11-09 (×3): qty 100

## 2020-11-09 MED ORDER — PREDNISONE 20 MG PO TABS: 60.0000 mg | ORAL_TABLET | Freq: Every day | ORAL | Status: DC

## 2020-11-09 MED ORDER — ACETAMINOPHEN 650 MG RE SUPP: 650.0000 mg | Freq: Four times a day (QID) | RECTAL | Status: DC | PRN

## 2020-11-09 MED ORDER — SODIUM CHLORIDE 0.9% FLUSH: 3.0000 mL | INTRAVENOUS | Status: DC | PRN

## 2020-11-09 MED ORDER — INSULIN ASPART 100 UNIT/ML IJ SOLN
0.0000 [IU] | Freq: Three times a day (TID) | INTRAMUSCULAR | Status: DC
  Administered 2020-11-10: 3 [IU] via SUBCUTANEOUS
  Administered 2020-11-10: 15 [IU] via SUBCUTANEOUS
  Administered 2020-11-10: 5 [IU] via SUBCUTANEOUS
  Administered 2020-11-11: 2 [IU] via SUBCUTANEOUS
  Administered 2020-11-11: 5 [IU] via SUBCUTANEOUS

## 2020-11-09 MED ORDER — SODIUM CHLORIDE 0.9 % IV SOLN: 250.0000 mL | INTRAVENOUS | Status: DC | PRN

## 2020-11-09 MED ORDER — ALBUTEROL SULFATE (2.5 MG/3ML) 0.083% IN NEBU
2.5000 mg | INHALATION_SOLUTION | RESPIRATORY_TRACT | Status: DC | PRN
  Administered 2020-11-10 (×2): 2.5 mg via RESPIRATORY_TRACT
  Filled 2020-11-09 (×2): qty 3

## 2020-11-09 MED ORDER — UMECLIDINIUM BROMIDE 62.5 MCG/INH IN AEPB
1.0000 | INHALATION_SPRAY | Freq: Every day | RESPIRATORY_TRACT | Status: DC
  Filled 2020-11-09: qty 7

## 2020-11-09 MED ORDER — ENOXAPARIN SODIUM 40 MG/0.4ML IJ SOSY
40.0000 mg | PREFILLED_SYRINGE | Freq: Every day | INTRAMUSCULAR | Status: DC
  Administered 2020-11-10 – 2020-11-11 (×2): 40 mg via SUBCUTANEOUS
  Filled 2020-11-09 (×2): qty 0.4

## 2020-11-09 MED ORDER — SODIUM CHLORIDE 0.9% FLUSH
3.0000 mL | Freq: Two times a day (BID) | INTRAVENOUS | Status: DC
  Administered 2020-11-10 – 2020-11-11 (×3): 3 mL via INTRAVENOUS

## 2020-11-09 MED ORDER — ACETAMINOPHEN 325 MG PO TABS
650.0000 mg | ORAL_TABLET | Freq: Four times a day (QID) | ORAL | Status: DC | PRN
  Administered 2020-11-10: 650 mg via ORAL
  Filled 2020-11-09: qty 2

## 2020-11-09 MED ORDER — ALBUTEROL SULFATE HFA 108 (90 BASE) MCG/ACT IN AERS
6.0000 | INHALATION_SPRAY | Freq: Once | RESPIRATORY_TRACT | Status: AC
  Administered 2020-11-09: 6 via RESPIRATORY_TRACT
  Filled 2020-11-09: qty 6.7

## 2020-11-09 NOTE — Hospital Course (Signed)
Middle of last week had sore throat and swelling feeling that made it difficult for her to swollen due to pain and had pain behind her ear.  She states that she was feeling anxiety and shaky at the beginning  but has resolved.   On Thursday she developed congestion and sinus symptoms  Jeris Penta to urgent in [place] and was told that she had a virus. She went home and  felt like her symptoms were getting worse She would routinely check her SpO2  and went back to the urgent care this past Sunday because she so that her O2 sat when in the low 90's to high 80's She received AB and steroid shot She received a course of steroids and azithromycin They took an xray which did not show anything.    She has associated cough and states that she is always short of breath, she has some congestion and a subjective fever. No true chills or myalgias. Step son tested positive for strep throat at that same time  Right now she feels like her breathing is worse then her base line. She feels like her wheezing improved with breathing treatment her in the ED. She does have rib sourness and is not sure if it due to cough, she is unsure when it start but primarily on the right side.   She believes that she is coughing things more than usual, but denies production but then states that she had some milky mucus at times.   No history of kidney stones She states that she has a good appetite, denies any nausea currently, no emesis,  She admits to "diarrhea" for a couple weeks and increased urgency and has 1-2x episodes a day She states that it is watery, but unable to describe the color but no change in her color Right flank pain that radiates to the back? She admits to intermittent headaches and thought it was sinus She does admit to a history of sinus problems and states that her right nostril stays stopped up  No lower extremity edema No new rashes No recent travel   She takes albuterol Breztri for asthma, she denies  oxygen at home She takes leflunomide for RA  Her husband passed away from [place] last year She is vaccinated against covid but no booster  For DM she has not been checking her CBG and has not been taking her meds while sick  - Was on jardiance - Last A1c was ?  She does smoke cigarette for the last 6-7 years 2 packs a day. Before that she smoked 20 ish years and was at 3 packs or more a day.  She denies ETOH use, says she quit because she was afraid she was turning into and "alcoholic" She denies nonprescription drug use.   She has an LAD stent from Feb.

## 2020-11-09 NOTE — ED Provider Notes (Signed)
[facility] EMERGENCY DEPARTMENT Provider Note   CSN: [id number] Arrival date & time: 11/09/20  1133     History Chief Complaint  Patient presents with  . Shortness of Breath    Brenda Cochran is a 70 y.o. female.  Patient on 20 mg of prednisone for the last 2 days and on a Z-Pak.  Suspected pneumonia in the setting of asthma exacerbation.  Tested negative for flu and COVID.  No chest pain.  Pulse ox was slightly lower today than normal in the 90s and as low as 89.    The history is provided by the patient.  Shortness of Breath Severity:  Mild Onset quality:  Gradual Duration:  1 week Timing:  Intermittent Progression:  Waxing and waning Chronicity:  New Context: URI   Relieved by:  Inhaler (steroids) Ineffective treatments:  None tried Associated symptoms: cough and wheezing   Associated symptoms: no abdominal pain, no chest pain, no ear pain, no fever, no rash, no sore throat and no vomiting   Risk factors comment:  Asthma      Past Medical History:  Diagnosis Date  . Angina pectoris ([place]) 01/10/2019  . Asthma   . Asthma, chronic obstructive, with acute exacerbation ([place])    [name] 01/13/2014  FEV1 76% predicted FVC 77% predicted FEV1 to FVC ratio 100% predicted FEF 25 7569% predicted CXR 01/13/14: NAD Alpha one antitrypsin 156  IgE 56 sl elevated.  RAST neg    . Atherosclerotic heart disease of native coronary artery without angina pectoris   . CAD (coronary artery disease) 09/23/2018  . Chronic maxillary sinusitis   . Chronic tension-type headache, intractable 08/07/2019  . Cigarette nicotine dependence with nicotine-induced disorder 08/18/2019  . Continuous dependence on cigarette smoking 06/09/2019  . COPD (chronic obstructive pulmonary disease) ([place])   . Coronary artery disease involving native coronary artery of native heart without angina pectoris 07/25/2018  . Cough 10/29/2019  . Depression   . Dyslipidemia associated with type 2 diabetes mellitus  ([place]) 08/23/2018  . Essential hypertension 07/18/2018  . Fibromyalgia   . GERD (gastroesophageal reflux disease)   . Headache   . Hyperlipidemia   . Hypertensive heart disease without heart failure   . Hypothyroidism (acquired)   . Loculated pleural effusion 12/30/2019  . Major depressive disorder, single episode, mild ([place])   . Menopausal and female climacteric states   . Mixed hyperlipidemia   . Nicotine dependence, cigarettes, with unspecified nicotine-induced disorders   . NSTEMI (non-ST elevated myocardial infarction) ([place]) 07/12/2018  . Numbness and tingling in left upper extremity   . OSA (obstructive sleep apnea) 01/13/2014   Sleep study 02/19/14 : AHI 5.5.  Mild sleep apnea.  Lowest SpO2 81%   . Osteoarthritis   . Overweight   . Pain in right knee 07/04/2017  . Palpitations 08/15/2019  . Pleural effusion, not elsewhere classified 12/30/2019  . Pneumonia due to COVID-19 virus 01/06/2020  . RA (rheumatoid arthritis) ([place])    [name]  . Shortness of breath 10/29/2019  . Skin cancer    face  . Sleep apnea    cpap  . TIA (transient ischemic attack) 09/14/2016  . Type 2 diabetes mellitus ([place])   . Type 2 diabetes mellitus with diabetic nephropathy ([place])   . Vitamin D deficiency, unspecified     Patient Active Problem List   Diagnosis Date Noted  . Hyperlipidemia   . Pneumonia due to COVID-19 virus 01/06/2020  . Asthma   . Atherosclerotic  heart disease of native coronary artery without angina pectoris   . Chronic maxillary sinusitis   . COPD (chronic obstructive pulmonary disease) ([place])   . Headache   . Hypertensive heart disease without heart failure   . Major depressive disorder, single episode, mild ([place])   . Menopausal and female climacteric states   . Nicotine dependence, cigarettes, with unspecified nicotine-induced disorders   . Overweight   . Sleep apnea   . Type 2 diabetes mellitus ([place])   . Type 2 diabetes mellitus with diabetic nephropathy ([place])   . Vitamin  D deficiency, unspecified   . Pleural effusion, not elsewhere classified 12/30/2019  . Loculated pleural effusion 12/30/2019  . Shortness of breath 10/29/2019  . Cough 10/29/2019  . Cigarette nicotine dependence with nicotine-induced disorder 08/18/2019  . Palpitations 08/15/2019  . Chronic tension-type headache, intractable 08/07/2019  . Continuous dependence on cigarette smoking 06/09/2019  . Angina pectoris ([place]) 01/10/2019  . CAD (coronary artery disease) 09/23/2018  . Dyslipidemia associated with type 2 diabetes mellitus ([place]) 08/23/2018  . Coronary artery disease involving native coronary artery of native heart without angina pectoris 07/25/2018  . Essential hypertension 07/18/2018  . NSTEMI (non-ST elevated myocardial infarction) ([place]) 07/12/2018  . Pain in right knee 07/04/2017  . Numbness and tingling in left upper extremity   . TIA (transient ischemic attack) 09/14/2016  . OSA (obstructive sleep apnea) 01/13/2014  . Asthma, chronic obstructive, with acute exacerbation ([place])   . Osteoarthritis   . Mixed hyperlipidemia   . Fibromyalgia   . RA (rheumatoid arthritis) ([place])   . Skin cancer   . Hypothyroidism (acquired)   . GERD (gastroesophageal reflux disease)   . Depression     Past Surgical History:  Procedure Laterality Date  . ABDOMINAL HYSTERECTOMY  1984  . BREAST LUMPECTOMY  1976   left, benign  . CHOLECYSTECTOMY  09/05/10  . CORONARY STENT INTERVENTION N/A 07/12/2018   Procedure: CORONARY STENT INTERVENTION;  Surgeon: Jettie Booze, MD;  Location: [facility];  Service: Cardiovascular;  Laterality: N/A;  . CORONARY THROMBECTOMY N/A 07/12/2018   Procedure: Coronary Thrombectomy;  Surgeon: Jettie Booze, MD;  Location: [facility];  Service: Cardiovascular;  Laterality: N/A;  . INTRAVASCULAR PRESSURE WIRE/FFR STUDY N/A 01/21/2019   Procedure: INTRAVASCULAR PRESSURE WIRE/FFR STUDY;  Surgeon: Jettie Booze, MD;  Location: [facility];  Service: Cardiovascular;  Laterality: N/A;  . LEFT HEART CATH AND CORONARY ANGIOGRAPHY N/A 07/12/2018   Procedure: LEFT HEART CATH AND CORONARY ANGIOGRAPHY;  Surgeon: Jettie Booze, MD;  Location: [facility];  Service: Cardiovascular;  Laterality: N/A;  . LEFT HEART CATH AND CORONARY ANGIOGRAPHY N/A 01/21/2019   Procedure: LEFT HEART CATH AND CORONARY ANGIOGRAPHY;  Surgeon: Jettie Booze, MD;  Location: [facility];  Service: Cardiovascular;  Laterality: N/A;     OB History   No obstetric history on file.     Family History  Problem Relation Age of Onset  . Breast cancer Sister   . Lymphoma Sister   . Multiple sclerosis Brother   . Muscular dystrophy Brother   . CAD Other   . Diabetes Other   . Hypertension Other   . Seizures Other   . Lung disease Mother        never smoker  . Lung disease Maternal Uncle        lung cancer  . Lung disease Maternal Uncle   . Lung cancer Father   .  Bone cancer Father   . COPD Brother   . Colon cancer Neg Hx   . Colon polyps Neg Hx   . Esophageal cancer Neg Hx   . Stomach cancer Neg Hx   . Rectal cancer Neg Hx     Social History   Tobacco Use  . Smoking status: Current Every Day Smoker    Packs/day: 4.00    Years: 30.00    Pack years: 120.00    Types: Cigarettes  . Smokeless tobacco: Never Used  . Tobacco comment: now smoking 1/2 pack a day 7/27 ep  Vaping Use  . Vaping Use: Never used  Substance Use Topics  . Alcohol use: No  . Drug use: No    Home Medications Prior to Admission medications   Medication Sig Start Date End Date Taking? Authorizing Provider  albuterol (VENTOLIN HFA) 108 (90 Base) MCG/ACT inhaler Inhale 1-2 puffs into the lungs every 6 (six) hours as needed for wheezing or shortness of breath.    [provider]  aspirin EC 81 MG tablet Take 1 tablet (81 mg total) by mouth daily. 06/18/20   Revankar, Reita Cliche, MD  Budeson-Glycopyrrol-Formoterol (BREZTRI AEROSPHERE)  160-9-4.8 MCG/ACT AERO Inhale 2 puffs into the lungs 2 (two) times daily. 08/07/19   Cox, Elnita Maxwell, MD  buPROPion (ZYBAN) 150 MG 12 hr tablet Take 1 tablet (150 mg total) by mouth 2 (two) times daily. 06/18/20   Revankar, Reita Cliche, MD  JARDIANCE 25 MG TABS tablet Take 25 mg by mouth daily. 07/14/20   [provider]  leflunomide (ARAVA) 20 MG tablet Take 20 mg by mouth daily.    [provider]  metoprolol succinate (TOPROL XL) 25 MG 24 hr tablet Take 1 tablet (25 mg total) by mouth daily. 06/18/20   Revankar, Reita Cliche, MD  nitroGLYCERIN (NITROSTAT) 0.4 MG SL tablet Place 1 tablet (0.4 mg total) under the tongue every 5 (five) minutes x 3 doses as needed for chest pain. 06/18/20   Revankar, Reita Cliche, MD  rosuvastatin (CRESTOR) 10 MG tablet TAKE 1 TABLET AT NIGHT FOR HIGH CHOLESTEROL 06/18/20   Revankar, Reita Cliche, MD  WEEKLY-D 1.25 MG (50000 UT) capsule Take 50,000 Units by mouth once a week. 03/12/20   [provider]    Allergies    Shellfish allergy, Contrast media [iodinated diagnostic agents], Metformin and related, Amitriptyline hcl, Cymbalta [duloxetine hcl], Ultram [tramadol], and Oxycontin [oxycodone hcl]  Review of Systems   Review of Systems  Constitutional: Negative for chills and fever.  HENT: Negative for ear pain and sore throat.   Eyes: Negative for pain and visual disturbance.  Respiratory: Positive for cough, shortness of breath and wheezing.   Cardiovascular: Negative for chest pain and palpitations.  Gastrointestinal: Negative for abdominal pain and vomiting.  Genitourinary: Negative for dysuria and hematuria.  Musculoskeletal: Negative for arthralgias and back pain.  Skin: Negative for color change and rash.  Neurological: Negative for seizures and syncope.  All other systems reviewed and are negative.   Physical Exam Updated Vital Signs  ED Triage Vitals  Enc Vitals Group     BP 11/09/20 1142 (!) 179/72     Pulse Rate 11/09/20 1142 66     Resp  11/09/20 1142 19     Temp 11/09/20 1142 (!) 97.5 F (36.4 C)     Temp Source 11/09/20 1142 Oral     SpO2 11/09/20 1142 94 %     Weight --      Height --  Head Circumference --      Peak Flow --      Pain Score 11/09/20 1158 0     Pain Loc --      Pain Edu? --      Excl. in [place]? --     Physical Exam Vitals and nursing note reviewed.  Constitutional:      General: She is not in acute distress.    Appearance: She is well-developed. She is not ill-appearing.  HENT:     Head: Normocephalic and atraumatic.     Mouth/Throat:     Mouth: Mucous membranes are moist.  Eyes:     Conjunctiva/sclera: Conjunctivae normal.     Pupils: Pupils are equal, round, and reactive to light.  Cardiovascular:     Rate and Rhythm: Normal rate and regular rhythm.     Pulses: Normal pulses.     Heart sounds: Normal heart sounds. No murmur heard.   Pulmonary:     Effort: Tachypnea present. No respiratory distress.     Breath sounds: Wheezing present.  Abdominal:     Palpations: Abdomen is soft.     Tenderness: There is no abdominal tenderness.  Musculoskeletal:        General: Normal range of motion.     Cervical back: Normal range of motion and neck supple.     Right lower leg: No edema.     Left lower leg: No edema.  Skin:    General: Skin is warm and dry.     Capillary Refill: Capillary refill takes less than 2 seconds.  Neurological:     General: No focal deficit present.     Mental Status: She is alert.     ED Results / Procedures / Treatments   Labs (all labs ordered are listed, but only abnormal results are displayed) Labs Reviewed  BASIC METABOLIC PANEL - Abnormal; Notable for the following components:      Result Value   Sodium 134 (*)    Potassium 3.4 (*)    Glucose, Bld 279 (*)    All other components within normal limits  CBC WITH DIFFERENTIAL/PLATELET - Abnormal; Notable for the following components:   Hemoglobin 15.4 (*)    HCT 46.8 (*)    All other components  within normal limits  RESP PANEL BY RT-PCR (FLU A&B, COVID) ARPGX2  BRAIN NATRIURETIC PEPTIDE    EKG EKG Interpretation  Date/Time:  Tuesday November 09 2020 11:57:06 EDT Ventricular Rate:  83 PR Interval:  130 QRS Duration: 88 QT Interval:  374 QTC Calculation: 439 R Axis:   12 Text Interpretation: Normal sinus rhythm with sinus arrhythmia Normal ECG Confirmed by Lennice Sites ([id number]) on 11/09/2020 8:26:55 PM   Radiology DG Chest 2 View  Result Date: 11/09/2020 CLINICAL DATA:  Shortness of breath. EXAM: CHEST - 2 VIEW COMPARISON:  CT 12/30/2019.  Chest x-ray 12/10/2019. FINDINGS: Mediastinum hilar structures normal. Cardiomegaly. Lung volumes. Mild bilateral interstitial prominence. Mild pneumonitis cannot be excluded. No pleural effusion or pneumothorax. Diffuse osteopenia degenerative change. IMPRESSION: Mild bilateral interstitial prominence. Mild pneumonitis cannot be excluded. Electronically Signed   By: Marcello Moores  Register   On: 11/09/2020 12:52   CT Chest Wo Contrast  Result Date: 11/09/2020 CLINICAL DATA:  Shortness of breath. EXAM: CT CHEST WITHOUT CONTRAST TECHNIQUE: Multidetector CT imaging of the chest was performed following the standard protocol without IV contrast. COMPARISON:  12/30/2019 FINDINGS: Cardiovascular: Atheromatous calcifications, including the coronary arteries and aorta. Normal sized heart. Mediastinum/Nodes: No enlarged mediastinal or  axillary lymph nodes. Thyroid gland, trachea, and esophagus demonstrate no significant findings. Lungs/Pleura: Interval mild scattered patchy opacities in both lungs. These are involving the left lower lobe and both upper lobes. No pleural fluid. Upper Abdomen: Cholecystectomy clips. Musculoskeletal: Thoracic spine degenerative changes. IMPRESSION: 1. Atypical infection involving both upper lobes and left lower lobe. This is not a typical pattern for pneumonia or COVID pneumonitis. Differential considerations include early changes of septic  emboli, fungal infection and atypical mycobacterial infection. 2.  Calcific coronary artery and aortic atherosclerosis. Aortic Atherosclerosis (ICD10-I70.0). Electronically Signed   By: Claudie Revering M.D.   On: 11/09/2020 21:05    Procedures Procedures   Medications Ordered in ED Medications  albuterol (VENTOLIN HFA) 108 (90 Base) MCG/ACT inhaler 6 puff (6 puffs Inhalation Given 11/09/20 2127)  ipratropium (ATROVENT HFA) inhaler 2 puff (2 puffs Inhalation Given 11/09/20 2128)    ED Course  I have reviewed the triage vital signs and the nursing notes.  Pertinent labs & imaging results that were available during my care of the patient were reviewed by me and considered in my medical decision making (see chart for details).    MDM Rules/Calculators/A&P                          IASIA FORCIER is a 70 year old female with history of rheumatoid arthritis, COPD who presents the ED with shortness of breath.  Currently on steroids, Z-Pak, breathing treatments with not much relief.  Has had multiple negative COVID and flu test.  Chest x-ray today shows mild pneumonitis possibly.  No major effusion or focal pneumonia.  She has wheezing on exam.  Normal vitals otherwise. She is overall on the low-dose of steroid at 20 mg for the last 2 days.  She is on her third day of antibiotics.  Overall we will get a CT scan to further evaluate and give higher dose of steroid and additional breathing treatment.  Room air oxygenation is normal but slight increased work of breathing.  CT scan shows atypical infection involving the upper and lower left lobes.  Overall this appears to be possibly an atypical infection including an atypical mild bacterial infection or fungal infection.  Does not fit the pattern for typical pneumonia or COVID.  Patient is on immunologic medicine for rheumatoid arthritis.  Given that she is got a COPD exacerbation and atypical infection.  Will admit to medicine for further work-up.  Will defer  antibiotic treatment and further work-up to medicine team.  No concern for sepsis.  This chart was dictated using voice recognition software.  Despite best efforts to proofread,  errors can occur which can change the documentation meaning.    Final Clinical Impression(s) / ED Diagnoses Final diagnoses:  Atypical pneumonia  COPD exacerbation [facility])    Rx / DC Orders ED Discharge Orders    None       Lennice Sites, DO 11/09/20 2148

## 2020-11-09 NOTE — ED Triage Notes (Signed)
Pt reports feeling shob last weekend so went to [facility] with negative flu and covid tests. Reports SpO2 89% on home pulse ox so went back to UC and was told she had pneumonia. Then received call yesterday that it was not pneumonia. Pt still c/o shob and feeling poorly, coughing violently in triage.

## 2020-11-09 NOTE — ED Provider Notes (Signed)
Emergency Medicine Provider Triage Evaluation Note  Brenda Cochran , a 70 y.o. female  was evaluated in triage.  Pt complains of SOB. She has coughing, congestion and generally feeling ill. States today her oximeter showed her O2 between 89-92 so she came in for eval. Has h/o of viral illness x 1 week. Also has asthma. Was seen in UC Sunday and treated for PNA per patient. Unable to see on chart review.  Review of Systems  Positive: SOB, cough, congestion  Negative: Chest pain, leg swelling   Physical Exam  BP (!) 179/72 (BP Location: Right Arm)   Pulse 66   Temp (!) 97.5 F (36.4 C) (Oral)   Resp 19   SpO2 94%  Gen:   Awake, no distress   Resp:  Normal effort. Diffuse wheezing bilaterally  MSK:   Moves extremities without difficulty  Other:  S1, S2  Medical Decision Making  Medically screening exam initiated at 11:59 AM.  Appropriate orders placed.  Brenda Cochran was informed that the remainder of the evaluation will be completed by another provider, this initial triage assessment does not replace that evaluation, and the importance of remaining in the ED until their evaluation is complete.    Sherrill Raring, PA-C 11/09/20 1202    Charlesetta Shanks, MD 11/16/20 2139

## 2020-11-09 NOTE — ED Notes (Signed)
Admitting DO at bedside.

## 2020-11-09 NOTE — ED Notes (Signed)
Patient transported to CT 

## 2020-11-09 NOTE — H&P (Addendum)
Date: 11/10/2020               Patient Name:  Brenda Cochran MRN: [id number]  DOB: [birth year] Age / Sex: 70 y.o., female   PCP: Imagene Riches, NP         Medical Service: Internal Medicine Teaching Service         Attending Physician: Dr. Aldine Contes     First Contact: Dr. Allyson Sabal Pager: [phone]  Second Contact: Dr. Charleen Kirks  Pager: [phone]       After Hours (After 5p/  First Contact Pager: [phone]  weekends / holidays): Second Contact Pager: [phone]   Chief Complaint: Shortness of Breath   History of Present Illness:   [name] is a 70 y.o. lady w/ PMHx COPD, Asthma on Breztri, chronic maxillary sinusitis, COVID-19 PNA, OSA, RA on leflunamide, Fibromyalgia, facial BCC s/p resection w/ new concerning facial lesion, HTN, HLD, CAD s/p PCI to LAD, T2DM, hypothyroidism, osteoarthritis, vitamin D deficiency, chronic, intractable tension-type headaches, depression, and TUD not on any home O2, presenting due to worsening shortness of breath and cough. She says she first noticed a sore throat 6 days ago as well as swelling behind her right ear. Her stepson did test positive for strep throat at the time although she says she hadn't been in contact with him and didn't get tested. Sore throat and swelling resolved, but then she woke up 4 days ago congested with intermittent rigors and headaches, bilateral frontal sinus pressure (more sharp than her typical headaches), and progressively worsening shortness of breath, congestion, and cough since that time. She went to [facility] on Friday and was told she likely had a virus; however, her oxygen saturations at home dropped down to 89% so she went to [facility] Sunday where she was told she likely has PNA. She was told she had a low-grade fever at the time. She says she received an ABX shot, steroid shot, ?20mg  Prednisone daily, and Z-pack as well as a nebulized breathing treatment. She went home and received a call  Monday that her chest x-ray did not show concerns for pneumonia. She continued her prescribed therapy but states that she was feeling so poorly with SOB and wheezing worse than her baseline, diffuse arthralgias (worse in lower extremities over the past week), diarrhea (1-2 episodes of watery, chronically dark stools per day), and nausea that she hadn't taken any of her other prescribed medications including insulins. Noted her sugars had been higher than usual at home. She endorses left-sided abdominal pain worst in her RUQ which radiates into her back that has intermittently been present with diarrhea over the past week. She is uncertain whether this is related to her cough. Endorsed hx of right deviated septum and chronic sinusitis. Has 2 pet dogs and says she could possibly have mold in her house (damp). Denies hx of allergies, recent travel, or other sick contacts. Denies dysuria, worsening neck stiffness, decreased PO intake recently, or any other symptoms. Notes she has a follow up appointment with dermatology 11/22/20.   Home Medications: Current Meds  Medication Sig  . albuterol (VENTOLIN HFA) 108 (90 Base) MCG/ACT inhaler Inhale 1-2 puffs into the lungs every 6 (six) hours as needed for wheezing or shortness of breath.  Marland Kitchen aspirin EC 81 MG tablet Take 1 tablet (81 mg total) by mouth daily.  Marland Kitchen azithromycin (ZITHROMAX) 250 MG tablet Take 250 mg by mouth as directed.  . Budeson-Glycopyrrol-Formoterol (BREZTRI  AEROSPHERE) 160-9-4.8 MCG/ACT AERO Inhale 2 puffs into the lungs 2 (two) times daily.  Marland Kitchen escitalopram (LEXAPRO) 10 MG tablet Take 10 mg by mouth daily.  Marland Kitchen JARDIANCE 25 MG TABS tablet Take 25 mg by mouth daily.  Marland Kitchen leflunomide (ARAVA) 20 MG tablet Take 20 mg by mouth daily.  . metoprolol succinate (TOPROL XL) 25 MG 24 hr tablet Take 1 tablet (25 mg total) by mouth daily.  . nitroGLYCERIN (NITROSTAT) 0.4 MG SL tablet Place 1 tablet (0.4 mg total) under the tongue every 5 (five) minutes x 3 doses  as needed for chest pain.  . predniSONE (DELTASONE) 20 MG tablet Take 20 mg by mouth daily.  . rosuvastatin (CRESTOR) 10 MG tablet TAKE 1 TABLET AT NIGHT FOR HIGH CHOLESTEROL (Patient taking differently: Take 10 mg by mouth at bedtime. TAKE 1 TABLET AT NIGHT FOR HIGH CHOLESTEROL)  . WEEKLY-D 1.25 MG (50000 UT) capsule Take 50,000 Units by mouth once a week.   Allergies: Allergies as of 11/09/2020 - Review Complete 11/09/2020  Allergen Reaction Noted  . Shellfish allergy Anaphylaxis 01/12/2014  . Contrast media [iodinated diagnostic agents] Rash 01/12/2014  . Metformin and related Nausea And Vomiting 01/12/2014  . Amitriptyline hcl Other (See Comments) 08/06/2019  . Cymbalta [duloxetine hcl] Nausea And Vomiting 01/12/2014  . Oxycontin [oxycodone hcl] Palpitations 01/13/2014  . Ultram [tramadol] Nausea And Vomiting 01/12/2014   Past Medical History:  Diagnosis Date  . Angina pectoris ([place]) 01/10/2019  . Asthma   . Asthma, chronic obstructive, with acute exacerbation ([place])    [name] 01/13/2014  FEV1 76% predicted FVC 77% predicted FEV1 to FVC ratio 100% predicted FEF 25 7569% predicted CXR 01/13/14: NAD Alpha one antitrypsin 156  IgE 56 sl elevated.  RAST neg    . Atherosclerotic heart disease of native coronary artery without angina pectoris   . CAD (coronary artery disease) 09/23/2018  . Chronic maxillary sinusitis   . Chronic tension-type headache, intractable 08/07/2019  . Cigarette nicotine dependence with nicotine-induced disorder 08/18/2019  . Continuous dependence on cigarette smoking 06/09/2019  . COPD (chronic obstructive pulmonary disease) ([place])   . Coronary artery disease involving native coronary artery of native heart without angina pectoris 07/25/2018  . Cough 10/29/2019  . Depression   . Dyslipidemia associated with type 2 diabetes mellitus ([place]) 08/23/2018  . Essential hypertension 07/18/2018  . Fibromyalgia   . GERD (gastroesophageal reflux disease)   . Headache   .  Hyperlipidemia   . Hypertensive heart disease without heart failure   . Hypothyroidism (acquired)   . Loculated pleural effusion 12/30/2019  . Major depressive disorder, single episode, mild ([place])   . Menopausal and female climacteric states   . Mixed hyperlipidemia   . Nicotine dependence, cigarettes, with unspecified nicotine-induced disorders   . NSTEMI (non-ST elevated myocardial infarction) ([place]) 07/12/2018  . Numbness and tingling in left upper extremity   . OSA (obstructive sleep apnea) 01/13/2014   Sleep study 02/19/14 : AHI 5.5.  Mild sleep apnea.  Lowest SpO2 81%   . Osteoarthritis   . Overweight   . Pain in right knee 07/04/2017  . Palpitations 08/15/2019  . Pleural effusion, not elsewhere classified 12/30/2019  . Pneumonia due to COVID-19 virus 01/06/2020  . RA (rheumatoid arthritis) ([place])    [name]  . Shortness of breath 10/29/2019  . Skin cancer    face  . Sleep apnea    cpap  . TIA (transient ischemic attack) 09/14/2016  . Type 2 diabetes mellitus ([place])   .  Type 2 diabetes mellitus with diabetic nephropathy ([place])   . Vitamin D deficiency, unspecified    Family History:  Family History  Problem Relation Age of Onset  . Breast cancer Sister   . Lymphoma Sister   . Multiple sclerosis Brother   . Muscular dystrophy Brother   . CAD Other   . Diabetes Other   . Hypertension Other   . Seizures Other   . Lung disease Mother        never smoker  . Lung disease Maternal Uncle        lung cancer  . Lung disease Maternal Uncle   . Lung cancer Father   . Bone cancer Father   . COPD Brother   . Colon cancer Neg Hx   . Colon polyps Neg Hx   . Esophageal cancer Neg Hx   . Stomach cancer Neg Hx   . Rectal cancer Neg Hx   Patient's aunt and sister had lung cancer.  Endorses significant family history of asthma.   Social History:  Patient lives at home. Her husband passed away from COVID-46 last year.  She has received 2 COVID-19 vaccinations, no booster.  She's  smoked 2-2.5 PPD over the past 6-7 years and previously smoked 3+ PPD for 20 years (has >75 pack year smoking history). She has not smoked since symptom onset 6 days ago.  Denies alcohol use over the last several years. Says she quit because she was afraid she was "turning into an alcoholic".  Denies any illicit or non-prescription drug use.   Review of Systems: A complete ROS was negative except as per HPI.   Physical Exam: Blood pressure (!) 169/92, pulse 95, temperature 97.8 F (36.6 C), temperature source Oral, resp. rate (!) 24, height 5\' 5"  (1.651 m), weight 79.8 kg, SpO2 93 %.  General: Patient is overweight. She appears uncomfortable, in mild acute distress due to cough.  Eyes: There is minimal bilateral scleral icterus and conjunctival injection. PERRLA.  HENT: Slightly dry mucus membranes. No external nasal discharge noted.  Respiratory: Patient is tachypneic with some increased work of breathing on room air despite saturating in the 90's. There are diffuse rhonchi throughout all lung fields but no wheezes or rales, without diminished breath sounds at the bases. Active wet cough, minimally productive.  Cardiovascular: Regular rate and rhythm. No murmurs, rubs, or gallops. No lower extremity edema. Extremities are warm.  Neurological: Awake, alert and oriented x 4. There is a diffuse tremor worse at rest than with activity (hands, mouth area most pronounced). No CN deficits.  MSK: Normal muscle bulk and tone.  Abdominal: Soft although mildly distended with moderate TTP and voluntary guarding over the RUQ and epigastric regions with mild TTP without guarding in the RLQ. Normal bowel sounds throughout. No palpable masses.  Skin: There is significant facial and upper chest flushing. Skin over upper extremities is thick and dry. There is a ~1.5x1.5cm erythematous, scaled lesion over the right cheek and a couple, scattered brown, hyperkeratotic, raised lesions over patient's upper back. There  is a ~1x1inch superficial abrasion with dried blood over left shin without surrounding erythema.   Psych: Patient appears anxious with psychomotor agitation. Pleasant and cooperative.   EKG: personally reviewed my interpretation is NSR w/ sinus arrhythmia at 83bpm. No consecutive T wave or ST segment changes to suggest acute ischemia. Normal PR, QRS and QTc intervals.   CXR: personally reviewed my interpretation is mild bilateral interstitial prominence (per radiology cannot rule out mild pneumonitis)  with possible RLL atelectasis.   CT Chest without Contrast: personally reviewed. There are interval mild scattered patchy opacities in LLL and both upper lobes (since prior CT chest 12/30/19). IMPRESSION: 1. Atypical infection involving both upper lobes and left lower lobe. This is not a typical pattern for pneumonia or COVID pneumonitis. Differential considerations include early changes of septic emboli, fungal infection and atypical mycobacterial infection. 2.  Calcific coronary artery and aortic atherosclerosis. Aortic Atherosclerosis (ICD10-I70.0). Electronically Signed   By: Claudie Revering M.D.   On: 11/09/2020 21:05  Assessment & Plan by Problem: Active Problems:   COPD exacerbation ([place])  * Note that home mediations listed below have not yet been started. Plan listed is as intended, although pending home medication reconciliation.   # Likely Atypical Lower Respiratory Tract Infection  # COPD/Asthma Exacerbation  Patient's worsening SOB and productive cough are concerning for COPD exacerbation, most likely due to underlying atypical lower respiratory tract infection (such as viral or fungal vs. Atypical mycobacterial infection) given her symptoms described above as well as CT chest findings of interval mild scattered patchy opacities of both lungs (LLL and bilateral upper lobes) since 12/30/19. She has remained afebrile without WBC, although is at increased risk of atypical infection in  the setting of chronic immunosuppression (on leflunamide for RA) with uncontrolled DM. She does have a history of BCC s/p resection and has a recurrent, concerning facial lesion with dermatology follow up 11/22/20. Given personal hx of cancer, extensive smoking history, previous exudative effusion and lung nodule, and extensive family history of cancers, it is possible but much less likely that these scattered patchy opacities may represent malignancy and are less likely to represent septic pulmonary emboli in the absence of fevers and WBC. However, it is possible with her immunocompromised state that these markers may falsely be normal, especially with her recent rigors and systemic symptoms. She has screened negative for MRSA 2 yrs ago. COVID and influenza testing were negative and she received 6 puffs albuterol and 2 puffs Atrovent inhalers in ED.  - Started Prednisone 60mg  PO daily x 5 doses  - Incruse Ellipta 1 puff daily  - Albuterol 2.5mg  nebs q2 hrs PRN  - Doxycycline 100mg  BID x 5 days  - Ceftriaxone 1g IV nightly x 5 days  - Tylenol 650mg  q6hrs PRN for pain/fever  - Guaifenesin 88mL q6 hrs scheduled for cough - Encourage frequent use of flutter valve  - Check RPP (PCR) - Check expectorated sputum w/ gram stain & Cx - Strep pneumoniae urinary antigen - Pneumocystis PCR  - Serum Aspergillus Ag  - Repeating MRSA PCR screening  - Holding off on serum procalcitonin as false test results are likely due to chronic inflammatory as well as chronically immunosuppressed states - Check blood cultures x 2  - Morning CBC and CMP and HIV antibody   - Continuous pulse oximetry  - Supplemental O2 PRN for SpO2 goal ~92%   - Continuous cardiac monitoring x 24 hrs  - Placed on droplet precaution  - Pharmacy consult for medication reconciliation   # TUD  Patient has >75 pack year smoking history. Increased chest congestion could possibly be exacerbated by abrupt discontinuation of cigarette use with  onset of symptoms.  - Nicotine 21mg  patch daily  - Will need ongoing cessation counseling   # Uncontrolled Type II DM  She had been prescribed Jardiance 25mg  daily outpatient although had not been taking this or her sugars regularly since symptom onset. Last Hgb A1c was  6.4 08/2019 although she previously did have uncontrolled DM with high sugars recently confounded by recent outpatient steroid use. - Check Hgb A1c  - Moderate SSI (increase if needed)  - CBG monitoring q4hrs for now   # Hypokalemia  Potassium decreased from 3.3 on admission to 2.9. Likely due to poor PO intake recently and albuterol use. Magnesium is WNL.  - Giving KCl 25mEq IV overnight  - Check and replace morning electrolytes as needed   # HFpEF  Patient appears euvolemic despite mild interstitial prominence noted on CXR. BNP is 32.5 - no active signs of exacerbation.  - Daily weights  - Monitor intake and output   # HTN  Blood pressure was elevated to 179/72 on admission although since has improved. Likely in the setting of mild acute respiratory distress, steroids, inhalers. She takes Toprol XL 25mg  daily at home.  - Will hold off on beta blocker currently due to concern for obstructive lung disease  - Continue close monitoring with PRN order for hydralazine 10mg  q4hr PO for BP >180/100  # Rheumatoid Arthritis  Patient endorses a chronic history of back pain as well as diffuse skin rashes in the setting of long-standing RA. She takes leflunomide 20mg  PO daily outpatient and has not recently missed dosing. She does also have high dose vitamin D dosing on medication list.  - Will hold home leflunomide 20mg  daily for now due to concern for active infection  - Tylenol PRN for pain   # CAD and Aortic Atherosclerosis s/p PCI  # HLD  Patient denies any current or recent CP or symptoms of angina. EKG reassuring without signs of ACS.  - Continue home NTG 0.4mg  SL PRN for CP  - Continue home statin   # OSA Patient says  she has a CPAP at home although does not use this.  - Will hold off on CPAP given stable vital signs  - Continue close monitoring  - Would benefit from outpatient monitoring / follow up   # Depression  - Continue home Lexapro 10mg  daily and Bupropion 150mg  BID   Code Status: Full Code  Diet: HH/CM IVF: None DVT PPx: Lovenox   Dispo: Admit patient to Observation with expected length of stay less than 2 midnights.  Signed: Jeralyn Bennett, MD 11/10/2020, 3:09 AM  Pager: [phone] After 5pm on weekdays and 1pm on weekends: On Call pager: [phone]

## 2020-11-09 NOTE — ED Notes (Signed)
I contacted the provider about giving the pt a [place] sandwich. The provider approved for the pt to eat.

## 2020-11-10 DIAGNOSIS — J320 Chronic maxillary sinusitis: Secondary | ICD-10-CM | POA: Diagnosis present

## 2020-11-10 DIAGNOSIS — F32A Depression, unspecified: Secondary | ICD-10-CM | POA: Diagnosis present

## 2020-11-10 DIAGNOSIS — M069 Rheumatoid arthritis, unspecified: Secondary | ICD-10-CM | POA: Diagnosis present

## 2020-11-10 DIAGNOSIS — Z8616 Personal history of COVID-19: Secondary | ICD-10-CM | POA: Diagnosis not present

## 2020-11-10 DIAGNOSIS — J189 Pneumonia, unspecified organism: Secondary | ICD-10-CM | POA: Diagnosis present

## 2020-11-10 DIAGNOSIS — I5032 Chronic diastolic (congestive) heart failure: Secondary | ICD-10-CM | POA: Diagnosis present

## 2020-11-10 DIAGNOSIS — E559 Vitamin D deficiency, unspecified: Secondary | ICD-10-CM | POA: Diagnosis present

## 2020-11-10 DIAGNOSIS — J441 Chronic obstructive pulmonary disease with (acute) exacerbation: Secondary | ICD-10-CM | POA: Diagnosis present

## 2020-11-10 DIAGNOSIS — I2510 Atherosclerotic heart disease of native coronary artery without angina pectoris: Secondary | ICD-10-CM | POA: Diagnosis present

## 2020-11-10 DIAGNOSIS — I10 Essential (primary) hypertension: Secondary | ICD-10-CM

## 2020-11-10 DIAGNOSIS — E119 Type 2 diabetes mellitus without complications: Secondary | ICD-10-CM

## 2020-11-10 DIAGNOSIS — D849 Immunodeficiency, unspecified: Secondary | ICD-10-CM | POA: Diagnosis present

## 2020-11-10 DIAGNOSIS — M797 Fibromyalgia: Secondary | ICD-10-CM | POA: Diagnosis present

## 2020-11-10 DIAGNOSIS — E1165 Type 2 diabetes mellitus with hyperglycemia: Secondary | ICD-10-CM | POA: Diagnosis present

## 2020-11-10 DIAGNOSIS — Z20822 Contact with and (suspected) exposure to covid-19: Secondary | ICD-10-CM | POA: Diagnosis present

## 2020-11-10 DIAGNOSIS — J440 Chronic obstructive pulmonary disease with acute lower respiratory infection: Secondary | ICD-10-CM | POA: Diagnosis present

## 2020-11-10 DIAGNOSIS — E1121 Type 2 diabetes mellitus with diabetic nephropathy: Secondary | ICD-10-CM | POA: Diagnosis present

## 2020-11-10 DIAGNOSIS — I110 Hypertensive heart disease with heart failure: Secondary | ICD-10-CM | POA: Diagnosis present

## 2020-11-10 DIAGNOSIS — I252 Old myocardial infarction: Secondary | ICD-10-CM | POA: Diagnosis not present

## 2020-11-10 DIAGNOSIS — E876 Hypokalemia: Secondary | ICD-10-CM

## 2020-11-10 DIAGNOSIS — E039 Hypothyroidism, unspecified: Secondary | ICD-10-CM | POA: Diagnosis present

## 2020-11-10 DIAGNOSIS — E782 Mixed hyperlipidemia: Secondary | ICD-10-CM | POA: Diagnosis present

## 2020-11-10 DIAGNOSIS — J122 Parainfluenza virus pneumonia: Secondary | ICD-10-CM | POA: Diagnosis present

## 2020-11-10 DIAGNOSIS — Z8673 Personal history of transient ischemic attack (TIA), and cerebral infarction without residual deficits: Secondary | ICD-10-CM | POA: Diagnosis not present

## 2020-11-10 DIAGNOSIS — G4733 Obstructive sleep apnea (adult) (pediatric): Secondary | ICD-10-CM | POA: Diagnosis present

## 2020-11-10 DIAGNOSIS — F17210 Nicotine dependence, cigarettes, uncomplicated: Secondary | ICD-10-CM | POA: Diagnosis present

## 2020-11-10 DIAGNOSIS — I700 Atherosclerosis of aorta: Secondary | ICD-10-CM | POA: Diagnosis present

## 2020-11-10 DIAGNOSIS — Z85828 Personal history of other malignant neoplasm of skin: Secondary | ICD-10-CM | POA: Diagnosis not present

## 2020-11-10 HISTORY — DX: Chronic obstructive pulmonary disease with (acute) exacerbation: J44.1

## 2020-11-10 LAB — MAGNESIUM: Magnesium: 2.2 mg/dL (ref 1.7–2.4)

## 2020-11-10 LAB — RESPIRATORY PANEL BY PCR

## 2020-11-10 LAB — CBC
HCT: 49.7 % — ABNORMAL HIGH (ref 36.0–46.0)
Hemoglobin: 16.3 g/dL — ABNORMAL HIGH (ref 12.0–15.0)
MCH: 30.2 pg (ref 26.0–34.0)
MCHC: 32.8 g/dL (ref 30.0–36.0)
MCV: 92.2 fL (ref 80.0–100.0)
Platelets: 146 10*3/uL — ABNORMAL LOW (ref 150–400)
RBC: 5.39 MIL/uL — ABNORMAL HIGH (ref 3.87–5.11)
RDW: 12.3 % (ref 11.5–15.5)
WBC: 8.2 10*3/uL (ref 4.0–10.5)
nRBC: 0 % (ref 0.0–0.2)

## 2020-11-10 LAB — STREP PNEUMONIAE URINARY ANTIGEN: Strep Pneumo Urinary Antigen: NEGATIVE

## 2020-11-10 LAB — EXPECTORATED SPUTUM ASSESSMENT W GRAM STAIN, RFLX TO RESP C

## 2020-11-10 LAB — COMPREHENSIVE METABOLIC PANEL
ALT: 23 U/L (ref 0–44)
AST: 26 U/L (ref 15–41)
Albumin: 2.9 g/dL — ABNORMAL LOW (ref 3.5–5.0)
Alkaline Phosphatase: 94 U/L (ref 38–126)
Anion gap: 11 (ref 5–15)
BUN: 8 mg/dL (ref 8–23)
CO2: 27 mmol/L (ref 22–32)
Calcium: 8.9 mg/dL (ref 8.9–10.3)
Chloride: 101 mmol/L (ref 98–111)
Creatinine, Ser: 0.64 mg/dL (ref 0.44–1.00)
GFR, Estimated: >60 mL/min (ref >60)
Glucose, Bld: 158 mg/dL — ABNORMAL HIGH (ref 70–99)
Potassium: 2.9 mmol/L — ABNORMAL LOW (ref 3.5–5.1)
Sodium: 139 mmol/L (ref 135–145)
Total Bilirubin: 0.3 mg/dL (ref 0.3–1.2)
Total Protein: 6.7 g/dL (ref 6.5–8.1)

## 2020-11-10 LAB — BASIC METABOLIC PANEL
Anion gap: 10 (ref 5–15)
BUN: 6 mg/dL — ABNORMAL LOW (ref 8–23)
CO2: 28 mmol/L (ref 22–32)
Calcium: 8.8 mg/dL — ABNORMAL LOW (ref 8.9–10.3)
Chloride: 100 mmol/L (ref 98–111)
Creatinine, Ser: 0.71 mg/dL (ref 0.44–1.00)
GFR, Estimated: >60 mL/min (ref >60)
Glucose, Bld: 347 mg/dL — ABNORMAL HIGH (ref 70–99)
Potassium: 4.3 mmol/L (ref 3.5–5.1)
Sodium: 138 mmol/L (ref 135–145)

## 2020-11-10 LAB — PHOSPHORUS: Phosphorus: 3.5 mg/dL (ref 2.5–4.6)

## 2020-11-10 LAB — CBG MONITORING, ED
Glucose-Capillary: 149 mg/dL — ABNORMAL HIGH (ref 70–99)
Glucose-Capillary: 156 mg/dL — ABNORMAL HIGH (ref 70–99)
Glucose-Capillary: 244 mg/dL — ABNORMAL HIGH (ref 70–99)

## 2020-11-10 LAB — HIV ANTIBODY (ROUTINE TESTING W REFLEX): HIV Screen 4th Generation wRfx: NONREACTIVE

## 2020-11-10 LAB — GLUCOSE, CAPILLARY
Glucose-Capillary: 382 mg/dL — ABNORMAL HIGH (ref 70–99)
Glucose-Capillary: 301 mg/dL — ABNORMAL HIGH (ref 70–99)

## 2020-11-10 MED ORDER — HYDRALAZINE HCL 10 MG PO TABS: 10.0000 mg | ORAL_TABLET | ORAL | Status: DC | PRN

## 2020-11-10 MED ORDER — GUAIFENESIN 100 MG/5ML PO SOLN
5.0000 mL | Freq: Four times a day (QID) | ORAL | Status: DC
  Administered 2020-11-10 (×2): 100 mg via ORAL
  Filled 2020-11-10 (×3): qty 5

## 2020-11-10 MED ORDER — IPRATROPIUM-ALBUTEROL 0.5-2.5 (3) MG/3ML IN SOLN
3.0000 mL | RESPIRATORY_TRACT | Status: DC
  Administered 2020-11-10 (×4): 3 mL via RESPIRATORY_TRACT
  Filled 2020-11-10 (×4): qty 3

## 2020-11-10 MED ORDER — GUAIFENESIN-DM 100-10 MG/5ML PO SYRP: 5.0000 mL | ORAL_SOLUTION | ORAL | Status: DC | PRN

## 2020-11-10 MED ORDER — GUAIFENESIN 100 MG/5ML PO SOLN: 5.0000 mL | Freq: Four times a day (QID) | ORAL | Status: DC | PRN

## 2020-11-10 MED ORDER — POTASSIUM CHLORIDE 10 MEQ/100ML IV SOLN: 10.0000 meq | INTRAVENOUS | Status: DC

## 2020-11-10 MED ORDER — POTASSIUM CHLORIDE 10 MEQ/100ML IV SOLN
10.0000 meq | INTRAVENOUS | Status: AC
  Administered 2020-11-10 (×6): 10 meq via INTRAVENOUS
  Filled 2020-11-10 (×6): qty 100

## 2020-11-10 MED ORDER — MOMETASONE FURO-FORMOTEROL FUM 100-5 MCG/ACT IN AERO
2.0000 | INHALATION_SPRAY | Freq: Two times a day (BID) | RESPIRATORY_TRACT | Status: DC
  Administered 2020-11-10 – 2020-11-11 (×2): 2 via RESPIRATORY_TRACT
  Filled 2020-11-10: qty 8.8

## 2020-11-10 MED ORDER — METOPROLOL SUCCINATE ER 25 MG PO TB24
25.0000 mg | ORAL_TABLET | Freq: Every day | ORAL | Status: DC
  Administered 2020-11-10 – 2020-11-11 (×2): 25 mg via ORAL
  Filled 2020-11-10 (×2): qty 1

## 2020-11-10 MED ORDER — GUAIFENESIN-DM 100-10 MG/5ML PO SYRP: 10.0000 mL | ORAL_SOLUTION | ORAL | Status: DC | PRN

## 2020-11-10 NOTE — ED Notes (Signed)
Pt ambulated well to the bathroom

## 2020-11-10 NOTE — ED Notes (Signed)
Breakfast order placed ?

## 2020-11-10 NOTE — Plan of Care (Signed)

## 2020-11-10 NOTE — Progress Notes (Addendum)
   Subjective:   [name] states she is feeling a bit better today. We discussed RVP findings of parainfluenza and explained this is likely the cause of her SHOB.   ADDENDUM: Reassessed Ms Brenda Cochran this afternoon. She is still having diffuse wheezing, although improved from this morning. She would like to stay another night to ensure that her [facility] with coughing improves. She would like to continue ambulating while here to ensure she is capable of doing so at home as well.  Objective:  Vital signs in last 24 hours: Vitals:   11/10/20 0215 11/10/20 0230 11/10/20 0245 11/10/20 0400  BP: (!) 157/83 (!) 162/94 (!) 169/92 (!) 156/91  Pulse: 87 88 95 94  Resp: (!) 26 (!) 25 (!) 24 (!) 25  Temp:      TempSrc:      SpO2: 95% 95% 93% 97%  Weight:      Height:       Physical Exam: General: Elderly female, sitting up in bed, NAD. CV: normal rate and regular rhythm, no m/r/g. No peripheral edema noted. Pulm: diffuse expiratory wheezing bilaterally. Abdomen: soft, nontender, nondistended, normoactive bowel sounds. Neuro: AAOx3, no focal deficits noted. Skin: warm and dry.  Assessment/Plan:  Active Problems:   COPD exacerbation ([place])  [name] is a 70 year old female with history of COPD/asthma chronic maxillary sinusitis, COVID-19 pneumonia, obstructive sleep apnea, RA, tobacco use disorder who presented with worsening shortness of breath and cough over the past week, admitted for COPD/asthma exacerbation likely secondary to parainfluenza viral pneumonia.  COPD/asthma exacerbation 2/2 Parainfluenza viral pneumonia Patient presented with progressively worsening shortness of breath and cough for the last week.  Chest x-ray with mild bilateral interstitial prominence.  CT chest showing mild scattered patchy opacities in bilateral upper lobes and in left lower lobe suggestive of atypical infection.  HIV was negative and strep pneumo urinary antigen negative as well.  Respiratory viral panel positive  for parainfluenza virus 3, which is likely the source of exacerbation.  She had diffuse wheezing on exam this morning and continued to have wheezing this afternoon, although improved from morning examination.  No leukocytosis and she has remained afebrile.  Saturating well on room air today and was able to maintain adequate oxygen saturations with ambulation as well.  I anticipate that she will be stable for discharge home tomorrow. -Continue prednisone 60 mg daily for 5-day course -albuterol nebs prn, duonebs q4h scheduled, dulera -Added Robitussin DM 49mL q4h prn -flutter valve -Discontinued antibiotics as this is likely not a bacterial infection -Follow-up blood cultures x2 -Supplemental O2 as needed to maintain saturations greater than 88% -Droplet precautions -will need extensive outpatient counseling on smoking cessation  Hypokalemia K 2.9 this AM, repleted. -BMP tonight, replete to K >4.0  HTN On toprol-XL 25mg  daily at home. BP elevated while here, likely due to cough and mild respiratory distress. -Resume home toprol-XL  Type 2 DM On jardiance 25mg  daily at home, but has not been taking since onset of SHOB and cough. Last A1c 6.4 on 08/2019. -f/u HbA1c -SSI -CBG monitoring  Prior to Admission Living Arrangement: Home Anticipated Discharge Location: Home Barriers to Discharge: continued medical management Dispo: Anticipated discharge in approximately 0-1 day(s).   Virl Axe, MD 11/10/2020, 7:00 AM Pager: [phone] After 5pm on weekdays and 1pm on weekends: On Call pager [phone]

## 2020-11-10 NOTE — ED Notes (Signed)
o2 dropped from 96 to 91% while ambulating, however pt did start coughing which jumped pt heart rate and pulse to 130. After few minutes in the bed pt went back to 90 with HR

## 2020-11-11 ENCOUNTER — Encounter: Payer: Self-pay | Admitting: Internal Medicine

## 2020-11-11 ENCOUNTER — Telehealth: Payer: Self-pay | Admitting: Internal Medicine

## 2020-11-11 DIAGNOSIS — J441 Chronic obstructive pulmonary disease with (acute) exacerbation: Secondary | ICD-10-CM

## 2020-11-11 DIAGNOSIS — J45901 Unspecified asthma with (acute) exacerbation: Secondary | ICD-10-CM

## 2020-11-11 LAB — BASIC METABOLIC PANEL
Anion gap: 7 (ref 5–15)
BUN: 7 mg/dL — ABNORMAL LOW (ref 8–23)
CO2: 29 mmol/L (ref 22–32)
Calcium: 9 mg/dL (ref 8.9–10.3)
Chloride: 102 mmol/L (ref 98–111)
Creatinine, Ser: 0.61 mg/dL (ref 0.44–1.00)
GFR, Estimated: >60 mL/min (ref >60)
Glucose, Bld: 192 mg/dL — ABNORMAL HIGH (ref 70–99)
Potassium: 4 mmol/L (ref 3.5–5.1)
Sodium: 138 mmol/L (ref 135–145)

## 2020-11-11 LAB — CBC WITH DIFFERENTIAL/PLATELET
Abs Immature Granulocytes: 0.02 10*3/uL (ref 0.00–0.07)
Basophils Absolute: 0 10*3/uL (ref 0.0–0.1)
Basophils Relative: 0 %
Eosinophils Absolute: 0.1 10*3/uL (ref 0.0–0.5)
Eosinophils Relative: 1 %
HCT: 41.4 % (ref 36.0–46.0)
Hemoglobin: 13.8 g/dL (ref 12.0–15.0)
Immature Granulocytes: 0 %
Lymphocytes Relative: 34 %
Lymphs Abs: 2.6 10*3/uL (ref 0.7–4.0)
MCH: 30.3 pg (ref 26.0–34.0)
MCHC: 33.3 g/dL (ref 30.0–36.0)
MCV: 90.8 fL (ref 80.0–100.0)
Monocytes Absolute: 0.6 10*3/uL (ref 0.1–1.0)
Monocytes Relative: 8 %
Neutro Abs: 4.4 10*3/uL (ref 1.7–7.7)
Neutrophils Relative %: 57 %
Platelets: 285 10*3/uL (ref 150–400)
RBC: 4.56 MIL/uL (ref 3.87–5.11)
RDW: 11.8 % (ref 11.5–15.5)
WBC: 7.7 10*3/uL (ref 4.0–10.5)
nRBC: 0 % (ref 0.0–0.2)

## 2020-11-11 LAB — HEMOGLOBIN A1C
Hgb A1c MFr Bld: 8.8 % — ABNORMAL HIGH (ref 4.8–5.6)
Mean Plasma Glucose: 206 mg/dL

## 2020-11-11 LAB — GLUCOSE, CAPILLARY
Glucose-Capillary: 130 mg/dL — ABNORMAL HIGH (ref 70–99)
Glucose-Capillary: 230 mg/dL — ABNORMAL HIGH (ref 70–99)
Glucose-Capillary: 392 mg/dL — ABNORMAL HIGH (ref 70–99)

## 2020-11-11 MED ORDER — PREDNISONE 20 MG PO TABS: 60.0000 mg | ORAL_TABLET | Freq: Every day | ORAL | 0 refills | Status: DC

## 2020-11-11 MED ORDER — IPRATROPIUM-ALBUTEROL 0.5-2.5 (3) MG/3ML IN SOLN
3.0000 mL | Freq: Two times a day (BID) | RESPIRATORY_TRACT | Status: DC
  Administered 2020-11-11: 3 mL via RESPIRATORY_TRACT
  Filled 2020-11-11: qty 3

## 2020-11-11 MED ORDER — GUAIFENESIN-DM 100-10 MG/5ML PO SYRP: 10.0000 mL | ORAL_SOLUTION | ORAL | 0 refills | Status: DC | PRN

## 2020-11-11 NOTE — Progress Notes (Signed)
Inpatient Diabetes Program Recommendations  AACE/ADA: New Consensus Statement on Inpatient Glycemic Control (2015)  Target Ranges:  Prepandial:   less than 140 mg/dL      Peak postprandial:   less than 180 mg/dL (1-2 hours)      Critically ill patients:  140 - 180 mg/dL   Lab Results  Component Value Date   GLUCAP 130 (H) 11/11/2020   HGBA1C 8.8 (H) 11/10/2020    Review of Glycemic Control Results for DESHANTI, ADCOX (MRN [id number]) as of 11/11/2020 10:53  Ref. Range 11/10/2020 16:37 11/10/2020 20:15 11/11/2020 07:16  Glucose-Capillary Latest Ref Range: 70 - 99 mg/dL 382 (H) 301 (H) 130 (H)   Diabetes history: Type 2 DM Outpatient Diabetes medications: Jardiance 25 mg QD Current orders for Inpatient glycemic control: Novolog 0-15 units TID  Prednisone 60 mg QD  Inpatient Diabetes Program Recommendations:    In the setting of steroids, if patient to remain inpatient, consider adding Novolog 3 units TID (assuming patient consuming >50% of meals).   Thanks, Bronson Curb, MSN, RNC-OB Diabetes Coordinator [phone] (8a-5p)

## 2020-11-11 NOTE — Plan of Care (Signed)

## 2020-11-11 NOTE — Telephone Encounter (Signed)
Spoke with Brenda Cochran regarding lack of prednisone prescription. Reviewed discharge order and prednisone appeared to have been sent correctly. Unclear why it was not received. Possibly due to EMR error with the new update. Will re-try sending the scripts.

## 2020-11-11 NOTE — Discharge Summary (Signed)
Name: Brenda Cochran MRN: [id number] DOB: Jan 11, 1951 70 y.o. PCP: Imagene Riches, NP  Date of Admission: 11/09/2020 11:54 AM Date of Discharge: 11/11/2020 Attending Physician: Aldine Contes, MD  Discharge Diagnosis: 1.  COPD/asthma exacerbation secondary to parainfluenza viral pneumonia  2. T2DM  Discharge Medications: Allergies as of 11/11/2020       Reactions   Shellfish Allergy Anaphylaxis   Contrast Media [iodinated Diagnostic Agents] Rash   Metformin And Related Nausea And Vomiting   Amitriptyline Hcl Other (See Comments)   Somnolence   Cymbalta [duloxetine Hcl] Nausea And Vomiting   Oxycontin [oxycodone Hcl] Palpitations   Ultram [tramadol] Nausea And Vomiting        Medication List     STOP taking these medications    azithromycin 250 MG tablet Commonly known as: ZITHROMAX       TAKE these medications    albuterol 108 (90 Base) MCG/ACT inhaler Commonly known as: VENTOLIN HFA Inhale 1-2 puffs into the lungs every 6 (six) hours as needed for wheezing or shortness of breath.   aspirin EC 81 MG tablet Take 1 tablet (81 mg total) by mouth daily.   Breztri Aerosphere 160-9-4.8 MCG/ACT Aero Generic drug: Budeson-Glycopyrrol-Formoterol Inhale 2 puffs into the lungs 2 (two) times daily.   escitalopram 10 MG tablet Commonly known as: LEXAPRO Take 10 mg by mouth daily.   guaiFENesin-dextromethorphan 100-10 MG/5ML syrup Commonly known as: ROBITUSSIN DM Take 10 mLs by mouth every 4 (four) hours as needed for cough.   Jardiance 25 MG Tabs tablet Generic drug: empagliflozin Take 25 mg by mouth daily.   leflunomide 20 MG tablet Commonly known as: ARAVA Take 20 mg by mouth daily.   metoprolol succinate 25 MG 24 hr tablet Commonly known as: Toprol XL Take 1 tablet (25 mg total) by mouth daily.   nitroGLYCERIN 0.4 MG SL tablet Commonly known as: NITROSTAT Place 1 tablet (0.4 mg total) under the tongue every 5 (five) minutes x 3 doses as needed for chest  pain.   predniSONE 20 MG tablet Commonly known as: DELTASONE Take 3 tablets (60 mg total) by mouth daily with breakfast for 3 days. Start taking on: November 12, 2020 What changed:  how much to take when to take this   Weekly-D 1.25 MG (50000 UT) capsule Generic drug: Cholecalciferol Take 50,000 Units by mouth once a week.       ASK your doctor about these medications    buPROPion 150 MG 12 hr tablet Commonly known as: ZYBAN Take 1 tablet (150 mg total) by mouth 2 (two) times daily.   rosuvastatin 10 MG tablet Commonly known as: CRESTOR TAKE 1 TABLET AT NIGHT FOR HIGH CHOLESTEROL        Disposition and follow-up:   [name] was discharged from [facility] in [place] condition.  At the hospital follow up visit please address:  1.  COPD/asthma exacerbation secondary to parainfluenza viral pneumonia. Continue Breztri inhaler and rescue inhaler at home. Discharging with prednisone 60mg  for total 5-day course (ends on 11/14/2020) and with robitussin-DM for cough. F/u with PCP, Heide Scales FNP, early next week.  F/u with [facility] on 11/15/2020.  2. Type 2 DM. Patient on Jardiance 25mg  daily as outpatient. A1c during admission 8.8%, which is increased from prior of 6.4% 1 year ago. Continue Jardiance at discharge. F/u with PCP for addition of another antidiabetic medication.  2.  Labs / imaging needed at time of follow-up: CBC, BMP  3.  Pending labs/ test needing follow-up: blood cultures, Aspergillus Ag, respiratory culture  Follow-up Appointments:  Follow-up Information     Imagene Riches, NP. Schedule an appointment as soon as possible for a visit in 4 day(s).   Contact information: [place] [address] [phone]                 Hospital Course by problem list: 1. COPD/asthma exacerbation secondary to parainfluenza viral pneumonia.  Patient presented with progressively worsening shortness of breath and cough for the  last week.  Chest x-ray with mild bilateral interstitial prominence.  CT chest showing mild scattered patchy opacities in bilateral upper lobes and in left lower lobe suggestive of atypical infection.  COVID-19 negative, HIV negative, and strep pneumo urinary antigen negative.  Antibiotics were initially started with doxycycline and IV Rocephin, blood cultures drawn. Negative procalcitonin.  Respiratory viral panel positive for parainfluenza virus 3, which is likely the source of exacerbation.  Antibiotics were discontinued.  She had diffuse wheezing on exam since admission, but was otherwise afebrile with no leukocytosis.  She saturated well on room air throughout admission.  Started on prednisone 60 mg daily yesterday for 5-day course.  Also started on albuterol nebs as needed, DuoNebs every 4 hours scheduled, and Dulera.  Added Robitussin-DM every 4 hours as needed for cough.  This morning, patient clinically appeared much better.  Noted improvement in cough and shortness of breath.  Ambulatory oxygen saturations maintained greater than 90%.  She uses a Librarian, academic inhaler along with rescue inhaler at home, which will be resumed at discharge.  Will discharge with prednisone 60 mg for 3 more days to complete total 5-day course (ends on 11/14/2020) and with Robitussin-DM for cough suppression.  Plan to follow-up with PCP, Heide Scales FNP, early next week.  She has also scheduled an appointment with [facility] for 11/15/2020.  2. Type 2 DM. Patient on Jardiance 25mg  daily as outpatient. A1c during admission 8.8%, which is increased from prior of 6.4% 1 year ago. Started on SSI while admitted, but CBGs were elevated above goal. Will continue Jardiance at discharge. She will need f/u with PCP for addition of another antidiabetic medication to optimize glycemic control.  Subjective:  Patient reports feeling much better today.  Reports improvement in shortness of breath and cough.  Was able to walk around in  the room going to and from the bathroom without difficulty.  Discussed plan to discharge today should ambulatory oxygen saturations appear well.  She is in agreement with the plan.  Discharge Exam:   BP 138/71 (BP Location: Left Arm)   Pulse 87   Temp 98.5 F (36.9 C) (Oral)   Resp 16   Ht 5\' 5"  (1.651 m)   Wt 79.8 kg   SpO2 94%   BMI 29.28 kg/m  Discharge exam:  General: Elderly female, sitting up in bed, NAD. CV: Normal rate and regular rhythm, no murmurs rubs or gallops.  No peripheral edema noted. Pulmonary: Mild expiratory wheezing bilaterally, normal work of breathing. Abdomen: Soft, nontender, nondistended, normoactive bowel sounds. Neuro: AAOx4, no focal deficits noted. Skin: Warm and dry.  Pertinent Labs, Studies, and Procedures:  CBC Latest Ref Rng & Units 11/11/2020 11/10/2020 11/09/2020  WBC 4.0 - 10.5 K/uL 7.7 8.2 7.1  Hemoglobin 12.0 - 15.0 g/dL 13.8 16.3(H) 15.4(H)  Hematocrit 36.0 - 46.0 % 41.4 49.7(H) 46.8(H)  Platelets 150 - 400 K/uL 285 146(L) 300   CMP Latest Ref Rng & Units 11/11/2020  11/10/2020 11/10/2020  Glucose 70 - 99 mg/dL 192(H) 347(H) 158(H)  BUN 8 - 23 mg/dL 7(L) 6(L) 8  Creatinine 0.44 - 1.00 mg/dL 0.61 0.71 0.64  Sodium 135 - 145 mmol/L 138 138 139  Potassium 3.5 - 5.1 mmol/L 4.0 4.3 2.9(L)  Chloride 98 - 111 mmol/L 102 100 101  CO2 22 - 32 mmol/L 29 28 27   Calcium 8.9 - 10.3 mg/dL 9.0 8.8(L) 8.9  Total Protein 6.5 - 8.1 g/dL - - 6.7  Total Bilirubin 0.3 - 1.2 mg/dL - - 0.3  Alkaline Phos 38 - 126 U/L - - 94  AST 15 - 41 U/L - - 26  ALT 0 - 44 U/L - - 23   Respiratory Viral Panel: Adenovirus NOT DETECTED NOT DETECTED   Coronavirus 229E NOT DETECTED NOT DETECTED   Comment: (NOTE)  The Coronavirus on the Respiratory Panel, DOES NOT test for the novel  Coronavirus (2019 nCoV)   Coronavirus HKU1 NOT DETECTED NOT DETECTED   Coronavirus NL63 NOT DETECTED NOT DETECTED   Coronavirus OC43 NOT DETECTED NOT DETECTED   Metapneumovirus NOT DETECTED NOT  DETECTED   Rhinovirus / Enterovirus NOT DETECTED NOT DETECTED   Influenza A NOT DETECTED NOT DETECTED   Influenza B NOT DETECTED NOT DETECTED   Parainfluenza Virus 1 NOT DETECTED NOT DETECTED   Parainfluenza Virus 2 NOT DETECTED NOT DETECTED   Parainfluenza Virus 3 NOT DETECTED DETECTED Abnormal    Parainfluenza Virus 4 NOT DETECTED NOT DETECTED   Respiratory Syncytial Virus NOT DETECTED NOT DETECTED   Bordetella pertussis NOT DETECTED NOT DETECTED   Bordetella Parapertussis NOT DETECTED NOT DETECTED   Chlamydophila pneumoniae NOT DETECTED NOT DETECTED   Mycoplasma pneumoniae NOT DETECTED NOT DETECTED    BNP    Component Value Date/Time   BNP 32.5 11/09/2020 1203    ProBNP    Component Value Date/Time   PROBNP 75 10/29/2019 1258   HbA1c 8.8%    Discharge Instructions: Discharge Instructions     Call MD for:  difficulty breathing, headache or visual disturbances   Complete by: As directed    Call MD for:  extreme fatigue   Complete by: As directed    Call MD for:  hives   Complete by: As directed    Call MD for:  persistant dizziness or light-headedness   Complete by: As directed    Call MD for:  persistant nausea and vomiting   Complete by: As directed    Call MD for:  redness, tenderness, or signs of infection (pain, swelling, redness, odor or green/yellow discharge around incision site)   Complete by: As directed    Call MD for:  severe uncontrolled pain   Complete by: As directed    Call MD for:  temperature >100.4   Complete by: As directed    Diet - low sodium heart healthy   Complete by: As directed    Discharge instructions   Complete by: As directed    [name], it was a pleasure taking care of you during your stay here.  You came in with shortness of breath and was found to have a viral pneumonia that caused for your COPD/asthma to flareup.  You were treated here with inhalers and steroids.  Please take note of the following:  1.  Please finish your  course of steroids.  You will need to take 3 tablets (60 mg) each morning with breakfast for the next 3 days.  2.  I have prescribed Robitussin-DM  to help with your cough.  3.  Please follow-up with your primary care doctor early next week.  4.  Please follow-up with [facility] on Monday.   Increase activity slowly   Complete by: As directed        Signed: Virl Axe, MD 11/11/2020, 2:45 PM   Pager: [phone]

## 2020-11-12 LAB — CULTURE, RESPIRATORY W GRAM STAIN: Culture: NORMAL

## 2020-11-12 LAB — ASPERGILLUS ANTIGEN, BAL/SERUM: Aspergillus Ag, BAL/Serum: 0.18 Index (ref 0.00–0.49)

## 2020-11-15 ENCOUNTER — Other Ambulatory Visit: Payer: Self-pay

## 2020-11-15 ENCOUNTER — Encounter: Payer: Self-pay | Admitting: Primary Care

## 2020-11-15 ENCOUNTER — Ambulatory Visit: Payer: PPO | Admitting: Primary Care

## 2020-11-15 VITALS — BP 130/82 | HR 137 | Temp 97.4°F | Ht 65.0 in | Wt 182.6 lb

## 2020-11-15 DIAGNOSIS — J45901 Unspecified asthma with (acute) exacerbation: Secondary | ICD-10-CM

## 2020-11-15 DIAGNOSIS — R059 Cough, unspecified: Secondary | ICD-10-CM

## 2020-11-15 DIAGNOSIS — J441 Chronic obstructive pulmonary disease with (acute) exacerbation: Secondary | ICD-10-CM | POA: Diagnosis not present

## 2020-11-15 DIAGNOSIS — G4739 Other sleep apnea: Secondary | ICD-10-CM

## 2020-11-15 DIAGNOSIS — Z09 Encounter for follow-up examination after completed treatment for conditions other than malignant neoplasm: Secondary | ICD-10-CM | POA: Diagnosis not present

## 2020-11-15 DIAGNOSIS — J449 Chronic obstructive pulmonary disease, unspecified: Secondary | ICD-10-CM | POA: Diagnosis not present

## 2020-11-15 DIAGNOSIS — J4521 Mild intermittent asthma with (acute) exacerbation: Secondary | ICD-10-CM | POA: Diagnosis not present

## 2020-11-15 DIAGNOSIS — I119 Hypertensive heart disease without heart failure: Secondary | ICD-10-CM

## 2020-11-15 DIAGNOSIS — E1140 Type 2 diabetes mellitus with diabetic neuropathy, unspecified: Secondary | ICD-10-CM | POA: Diagnosis not present

## 2020-11-15 DIAGNOSIS — E559 Vitamin D deficiency, unspecified: Secondary | ICD-10-CM | POA: Diagnosis not present

## 2020-11-15 LAB — CULTURE, BLOOD (ROUTINE X 2)
Culture: NO GROWTH
Culture: NO GROWTH
Special Requests: ADEQUATE

## 2020-11-15 MED ORDER — IPRATROPIUM-ALBUTEROL 0.5-2.5 (3) MG/3ML IN SOLN: 3.0000 mL | Freq: Four times a day (QID) | RESPIRATORY_TRACT | 0 refills | Status: DC | PRN

## 2020-11-15 MED ORDER — PREDNISONE 10 MG PO TABS: ORAL_TABLET | ORAL | 0 refills | Status: DC

## 2020-11-15 NOTE — Assessment & Plan Note (Addendum)
-   BP 130/82, HR 117 regular rhythm on exam (130 with exertion)  - She has not taken any of her regular home medications since she was discharged from the hospital last week. Advised she resume Toprol XL 25mg . She has follow-up with her PCP later today

## 2020-11-15 NOTE — Assessment & Plan Note (Addendum)
-   Not currently being managed. Encourage she resume CPAP use, I will have her establish with one of our sleep providers

## 2020-11-15 NOTE — Progress Notes (Addendum)
@Patient  ID: Brenda Cochran, female    DOB: 1950-06-14, 70 y.o.   MRN: [id number]  Chief Complaint  Patient presents with   Follow-up    HFU, sob, coughing up white phlegm     Referring provider: Imagene Riches, NP  HPI: 70 year old female, current everyday smoker.  Past medical history significant for asthma, cough, obstructive sleep apnea, rheumatoid arthritis, GERD, history NSTEMI, hypertension.  Former patient of Dr. Carlis Abbott, last seen by pulmonary nurse practitioner on 12/30/2019.  Maintained on [employer].   11/15/2020 Patient presents today for hospital follow-up for COPD exacerbation secondary to parainfluenza viral pneumonia. She was admitted from 11/09/2020 - 11/11/2020.  Chest x-ray showed mild bilateral interstitial prominence.  CT chest showed mild scattered patchy opacities in bilateral upper lobes and in left lower lobe suggestive of atypical infection.  COVID-19 was negative.  HIV was negative.  Strep pneumonia urinary antigen was negative.  She was initially treated with doxycycline and IV Rocephin.  She had a negative procalcitonin level.  Respiratory viral panel was positive for parainfluenza virus.  Antibiotics were discontinued.  Ambulatory oxygen saturation maintained greater than 90% on room air.  She was discharged on prednisone 60 mg for total 5 days and Robitussin-DM.   She is still having shortness of breath and cough. She felt nebulized breathing treatments helped the most while she was in the hospital. She does not have a nebulizer machine at home. She has an oximeter at home, O2 during the day runs 93-95% and at night occasionally will drop 89-90%. She does have hx sleep apnea, she is not currently wearing CPAP. Her HR today is elevated. HR in hospital was 107-122. She tells me they stopped all her regular medication and was never told to start back. She has not been taking her HTN or DM medications.     Allergies  Allergen Reactions   Shellfish Allergy  Anaphylaxis   Contrast Media [Iodinated Diagnostic Agents] Rash   Metformin And Related Nausea And Vomiting   Amitriptyline Hcl Other (See Comments)    Somnolence   Cymbalta [Duloxetine Hcl] Nausea And Vomiting   Oxycontin [Oxycodone Hcl] Palpitations   Ultram [Tramadol] Nausea And Vomiting    Immunization History  Administered Date(s) Administered   Influenza, High Dose Seasonal PF 03/01/2018   Influenza,inj,Quad PF,6-35 Mos 05/06/2019   Influenza-Unspecified 02/03/2013, 03/05/2014   PFIZER(Purple Top)SARS-COV-2 Vaccination 09/05/2019, 09/29/2019   Pneumococcal Conjugate-13 04/13/2017   Pneumococcal Polysaccharide-23 03/01/2018   Pneumococcal-Unspecified 06/05/2010, 09/03/2016    Past Medical History:  Diagnosis Date   Angina pectoris ([place]) 01/10/2019   Asthma    Asthma, chronic obstructive, with acute exacerbation ([place])    [name] 01/13/2014  FEV1 76% predicted FVC 77% predicted FEV1 to FVC ratio 100% predicted FEF 25 7569% predicted CXR 01/13/14: NAD Alpha one antitrypsin 156  IgE 56 sl elevated.  RAST neg     Atherosclerotic heart disease of native coronary artery without angina pectoris    CAD (coronary artery disease) 09/23/2018   Chronic maxillary sinusitis    Chronic tension-type headache, intractable 08/07/2019   Cigarette nicotine dependence with nicotine-induced disorder 08/18/2019   Continuous dependence on cigarette smoking 06/09/2019   COPD (chronic obstructive pulmonary disease) (HCC)    Coronary artery disease involving native coronary artery of native heart without angina pectoris 07/25/2018   Cough 10/29/2019   Depression    Dyslipidemia associated with type 2 diabetes mellitus ([place]) 08/23/2018   Essential hypertension 07/18/2018   Fibromyalgia    GERD (gastroesophageal  reflux disease)    Headache    Hyperlipidemia    Hypertensive heart disease without heart failure    Hypothyroidism (acquired)    Loculated pleural effusion 12/30/2019   Major depressive disorder, single  episode, mild (HCC)    Menopausal and female climacteric states    Mixed hyperlipidemia    Nicotine dependence, cigarettes, with unspecified nicotine-induced disorders    NSTEMI (non-ST elevated myocardial infarction) ([place]) 07/12/2018   Numbness and tingling in left upper extremity    OSA (obstructive sleep apnea) 01/13/2014   Sleep study 02/19/14 : AHI 5.5.  Mild sleep apnea.  Lowest SpO2 81%    Osteoarthritis    Overweight    Pain in right knee 07/04/2017   Palpitations 08/15/2019   Pleural effusion, not elsewhere classified 12/30/2019   Pneumonia due to COVID-19 virus 01/06/2020   RA (rheumatoid arthritis) ([place])    [name]   Shortness of breath 10/29/2019   Skin cancer    face   Sleep apnea    cpap   TIA (transient ischemic attack) 09/14/2016   Type 2 diabetes mellitus ([place])    Type 2 diabetes mellitus with diabetic nephropathy (HCC)    Vitamin D deficiency, unspecified     Tobacco History: Social History   Tobacco Use  Smoking Status Former   Packs/day: 4.00   Years: 30.00   Pack years: 120.00   Types: Cigarettes  Smokeless Tobacco Never  Tobacco Comments   Quit 2 weeks ago, 11/15/2020   Counseling given: Not Answered Tobacco comments: Quit 2 weeks ago, 11/15/2020   Outpatient Medications Prior to Visit  Medication Sig Dispense Refill   albuterol (VENTOLIN HFA) 108 (90 Base) MCG/ACT inhaler Inhale 1-2 puffs into the lungs every 6 (six) hours as needed for wheezing or shortness of breath.     aspirin EC 81 MG tablet Take 1 tablet (81 mg total) by mouth daily. 90 tablet 3   Budeson-Glycopyrrol-Formoterol (BREZTRI AEROSPHERE) 160-9-4.8 MCG/ACT AERO Inhale 2 puffs into the lungs 2 (two) times daily. 10.7 g 3   buPROPion (ZYBAN) 150 MG 12 hr tablet Take 1 tablet (150 mg total) by mouth 2 (two) times daily. 180 tablet 3   escitalopram (LEXAPRO) 10 MG tablet Take 10 mg by mouth daily.     guaiFENesin-dextromethorphan (ROBITUSSIN DM) 100-10 MG/5ML syrup Take 10 mLs by mouth  every 4 (four) hours as needed for cough. 118 mL 0   JARDIANCE 25 MG TABS tablet Take 25 mg by mouth daily.     leflunomide (ARAVA) 20 MG tablet Take 20 mg by mouth daily.     metoprolol succinate (TOPROL XL) 25 MG 24 hr tablet Take 1 tablet (25 mg total) by mouth daily. 90 tablet 3   nitroGLYCERIN (NITROSTAT) 0.4 MG SL tablet Place 1 tablet (0.4 mg total) under the tongue every 5 (five) minutes x 3 doses as needed for chest pain. 25 tablet 12   rosuvastatin (CRESTOR) 10 MG tablet TAKE 1 TABLET AT NIGHT FOR HIGH CHOLESTEROL (Patient taking differently: Take 10 mg by mouth at bedtime. TAKE 1 TABLET AT NIGHT FOR HIGH CHOLESTEROL) 90 tablet 2   WEEKLY-D 1.25 MG (50000 UT) capsule Take 50,000 Units by mouth once a week.     predniSONE (DELTASONE) 20 MG tablet Take 3 tablets (60 mg total) by mouth daily with breakfast for 3 days. 9 tablet 0   No facility-administered medications prior to visit.    Review of Systems  Review of Systems  Constitutional: Negative.  HENT:  Positive for congestion.   Respiratory:  Positive for cough, shortness of breath and wheezing.   Psychiatric/Behavioral:  Positive for sleep disturbance.     Physical Exam  BP 130/82 (BP Location: Left Arm, Patient Position: Sitting, Cuff Size: Normal)   Pulse (!) 137   Temp (!) 97.4 F (36.3 C) (Temporal)   Ht 5\' 5"  (1.651 m)   Wt 182 lb 9.6 oz (82.8 kg)   SpO2 97%   BMI 30.39 kg/m  Physical Exam Constitutional:      Appearance: Normal appearance.  HENT:     Head: Normocephalic and atraumatic.     Mouth/Throat:     Mouth: Mucous membranes are moist.     Pharynx: Oropharynx is clear.  Cardiovascular:     Rate and Rhythm: Regular rhythm. Tachycardia present.     Comments: HR 117, regular rhythm (did not take Beta-blocker today) Pulmonary:     Effort: Pulmonary effort is normal.     Breath sounds: Wheezing and rales present.     Comments: Scattered rales right lung base Musculoskeletal:        General: Normal  range of motion.  Skin:    General: Skin is warm and dry.  Neurological:     General: No focal deficit present.     Mental Status: She is alert and oriented to person, place, and time. Mental status is at baseline.  Psychiatric:        Mood and Affect: Mood normal.        Behavior: Behavior normal.        Thought Content: Thought content normal.        Judgment: Judgment normal.     Lab Results:  CBC    Component Value Date/Time   WBC 7.7 11/11/2020 0102   RBC 4.56 11/11/2020 0102   HGB 13.8 11/11/2020 0102   HGB 14.3 08/15/2019 0745   HCT 41.4 11/11/2020 0102   HCT 43.0 08/15/2019 0745   PLT 285 11/11/2020 0102   PLT 300 08/15/2019 0745   MCV 90.8 11/11/2020 0102   MCV 94 08/15/2019 0745   MCH 30.3 11/11/2020 0102   MCHC 33.3 11/11/2020 0102   RDW 11.8 11/11/2020 0102   RDW 11.9 08/15/2019 0745   LYMPHSABS 2.6 11/11/2020 0102   LYMPHSABS 1.8 08/15/2019 0745   MONOABS 0.6 11/11/2020 0102   EOSABS 0.1 11/11/2020 0102   EOSABS 0.6 (H) 08/15/2019 0745   BASOSABS 0.0 11/11/2020 0102   BASOSABS 0.1 08/15/2019 0745    BMET    Component Value Date/Time   NA 138 11/11/2020 0102   NA 137 06/18/2020 0925   K 4.0 11/11/2020 0102   CL 102 11/11/2020 0102   CO2 29 11/11/2020 0102   GLUCOSE 192 (H) 11/11/2020 0102   BUN 7 (L) 11/11/2020 0102   BUN 10 06/18/2020 0925   CREATININE 0.61 11/11/2020 0102   CALCIUM 9.0 11/11/2020 0102   GFRNONAA >60 11/11/2020 0102   GFRAA 105 06/18/2020 0925    BNP    Component Value Date/Time   BNP 32.5 11/09/2020 1203    ProBNP    Component Value Date/Time   PROBNP 75 10/29/2019 1258    Imaging: DG Chest 2 View  Result Date: 11/09/2020 CLINICAL DATA:  Shortness of breath. EXAM: CHEST - 2 VIEW COMPARISON:  CT 12/30/2019.  Chest x-ray 12/10/2019. FINDINGS: Mediastinum hilar structures normal. Cardiomegaly. Lung volumes. Mild bilateral interstitial prominence. Mild pneumonitis cannot be excluded. No pleural effusion or  pneumothorax. Diffuse osteopenia  degenerative change. IMPRESSION: Mild bilateral interstitial prominence. Mild pneumonitis cannot be excluded. Electronically Signed   By: Marcello Moores  Register   On: 11/09/2020 12:52   CT Chest Wo Contrast  Result Date: 11/09/2020 CLINICAL DATA:  Shortness of breath. EXAM: CT CHEST WITHOUT CONTRAST TECHNIQUE: Multidetector CT imaging of the chest was performed following the standard protocol without IV contrast. COMPARISON:  12/30/2019 FINDINGS: Cardiovascular: Atheromatous calcifications, including the coronary arteries and aorta. Normal sized heart. Mediastinum/Nodes: No enlarged mediastinal or axillary lymph nodes. Thyroid gland, trachea, and esophagus demonstrate no significant findings. Lungs/Pleura: Interval mild scattered patchy opacities in both lungs. These are involving the left lower lobe and both upper lobes. No pleural fluid. Upper Abdomen: Cholecystectomy clips. Musculoskeletal: Thoracic spine degenerative changes. IMPRESSION: 1. Atypical infection involving both upper lobes and left lower lobe. This is not a typical pattern for pneumonia or COVID pneumonitis. Differential considerations include early changes of septic emboli, fungal infection and atypical mycobacterial infection. 2.  Calcific coronary artery and aortic atherosclerosis. Aortic Atherosclerosis (ICD10-I70.0). Electronically Signed   By: Claudie Revering M.D.   On: 11/09/2020 21:05     Assessment & Plan:   COPD mixed type ([place]) - Hospitalized for COPD exacerbation from 6/7-6/9 secondary to parainfluenza viral pneumonia. CT chest showed mild scattered patchy opacities in bilateral upper lobes and in left lower lobe suggestive of atypical infection - She is still having shortness of breath and cough. She is compliant with [employer]. She felt nebulized breathing treatments helped while in the hospital. We will send in DME for new nebulizer start and a prescription for ipratropium-albuterol.  Extending prednisone taper as she still has some wheezing on exam. We will also check sputum sample. FU in 1 months with new [facility] pulmonary provider (former Dr. Carlis Abbott)  Sleep apnea - Not currently being managed. Encourage she resume CPAP use, I will have her establish with one of our sleep providers   Hypertensive heart disease without heart failure - BP 130/82, HR 117 regular rhythm on exam (130 with exertion)  - She has not taken any of her regular home medications since she was discharged from the hospital last week. Advised she resume Toprol XL 25mg . She has follow-up with her PCP later today   Martyn Ehrich, NP 11/15/2020

## 2020-11-15 NOTE — Patient Instructions (Addendum)
Recommendations: Continue Breztri two puffs twice day Continue Robitusin - DM  Taper down prednisone as instructed  Use incentive spirometer every hour  Resume all previous home medications   Orders: Nebulizer machine Sputum sample   Follow-up: Dr. Halford Chessman 7/20- new patient visit (OSA, recent PNA)

## 2020-11-15 NOTE — Assessment & Plan Note (Addendum)
-   Hospitalized for COPD exacerbation from 6/7-6/9 secondary to parainfluenza viral pneumonia. CT chest showed mild scattered patchy opacities in bilateral upper lobes and in left lower lobe suggestive of atypical infection - She is still having shortness of breath and cough. She is compliant with [employer]. She felt nebulized breathing treatments helped while in the hospital. We will send in DME for new nebulizer start and a prescription for ipratropium-albuterol. Extending prednisone taper as she still has some wheezing on exam. We will also check sputum sample. FU in 1 months with new [facility] pulmonary provider (former Dr. Carlis Abbott)

## 2020-11-16 ENCOUNTER — Other Ambulatory Visit: Payer: Self-pay

## 2020-11-16 DIAGNOSIS — J441 Chronic obstructive pulmonary disease with (acute) exacerbation: Secondary | ICD-10-CM | POA: Diagnosis not present

## 2020-11-16 MED ORDER — IPRATROPIUM-ALBUTEROL 0.5-2.5 (3) MG/3ML IN SOLN: 3.0000 mL | Freq: Four times a day (QID) | RESPIRATORY_TRACT | 0 refills | Status: DC | PRN

## 2020-11-22 DIAGNOSIS — E1165 Type 2 diabetes mellitus with hyperglycemia: Secondary | ICD-10-CM | POA: Diagnosis not present

## 2020-11-22 DIAGNOSIS — L821 Other seborrheic keratosis: Secondary | ICD-10-CM | POA: Diagnosis not present

## 2020-11-22 DIAGNOSIS — J441 Chronic obstructive pulmonary disease with (acute) exacerbation: Secondary | ICD-10-CM | POA: Diagnosis not present

## 2020-11-22 DIAGNOSIS — J189 Pneumonia, unspecified organism: Secondary | ICD-10-CM | POA: Diagnosis not present

## 2020-11-22 DIAGNOSIS — Z20828 Contact with and (suspected) exposure to other viral communicable diseases: Secondary | ICD-10-CM | POA: Diagnosis not present

## 2020-11-22 DIAGNOSIS — R509 Fever, unspecified: Secondary | ICD-10-CM | POA: Diagnosis not present

## 2020-11-22 DIAGNOSIS — L570 Actinic keratosis: Secondary | ICD-10-CM | POA: Diagnosis not present

## 2020-11-22 DIAGNOSIS — R0602 Shortness of breath: Secondary | ICD-10-CM | POA: Diagnosis not present

## 2020-11-22 DIAGNOSIS — U071 COVID-19: Secondary | ICD-10-CM | POA: Diagnosis not present

## 2020-11-22 DIAGNOSIS — R233 Spontaneous ecchymoses: Secondary | ICD-10-CM | POA: Diagnosis not present

## 2020-11-23 DIAGNOSIS — R059 Cough, unspecified: Secondary | ICD-10-CM | POA: Diagnosis not present

## 2020-11-23 DIAGNOSIS — J189 Pneumonia, unspecified organism: Secondary | ICD-10-CM | POA: Diagnosis not present

## 2020-11-23 DIAGNOSIS — R0602 Shortness of breath: Secondary | ICD-10-CM | POA: Diagnosis not present

## 2020-11-23 DIAGNOSIS — R051 Acute cough: Secondary | ICD-10-CM | POA: Diagnosis not present

## 2020-11-25 ENCOUNTER — Telehealth: Payer: Self-pay | Admitting: Primary Care

## 2020-11-25 NOTE — Telephone Encounter (Signed)
Fax received stating that the ipratropium-albuterol neb sol is requiring a PA. I looked through pt's recent meds and saw where she has been on albuterol sol by itself before but pt has not been on ipratropium sol before.  What we might need to do since the combo neb sol is requiring a PA is send in two separate Rx one for the albuterol sol and one for the ipratropium sol.   Attempted to call pt to further discuss this and also to see if she had been able to get the neb sol. Left message for pt to return call so this can be further discussed.  If pt has not been able to pick up the Rx, we will then need to check with Beth to see we can send Rx for albuterol sol and Rx for ipratropium sol to pharmacy for pt.  Will await return call.

## 2020-12-10 ENCOUNTER — Ambulatory Visit: Payer: PPO | Admitting: Cardiology

## 2020-12-13 DIAGNOSIS — E1165 Type 2 diabetes mellitus with hyperglycemia: Secondary | ICD-10-CM | POA: Diagnosis not present

## 2020-12-13 DIAGNOSIS — U071 COVID-19: Secondary | ICD-10-CM | POA: Diagnosis not present

## 2020-12-13 DIAGNOSIS — R197 Diarrhea, unspecified: Secondary | ICD-10-CM | POA: Diagnosis not present

## 2020-12-13 DIAGNOSIS — J449 Chronic obstructive pulmonary disease, unspecified: Secondary | ICD-10-CM | POA: Diagnosis not present

## 2020-12-13 DIAGNOSIS — Z20828 Contact with and (suspected) exposure to other viral communicable diseases: Secondary | ICD-10-CM | POA: Diagnosis not present

## 2020-12-14 NOTE — Telephone Encounter (Signed)
Yes that is fine

## 2020-12-14 NOTE — Telephone Encounter (Signed)
Called and spoke with pt to see if she was ever able to receive the ipratropium-albuterol neb sol.  While speaking with pt, pt said that she had not received the Rx. Beth, please advise if you are okay with [place] sending two separate Rx (one for albuterol sol and one for ipratropium sol) to see if this can be approved for pt as it seems like her insurance is not liking the combo Rx.

## 2020-12-15 MED ORDER — IPRATROPIUM BROMIDE 0.02 % IN SOLN: 0.5000 mg | Freq: Four times a day (QID) | RESPIRATORY_TRACT | 11 refills | Status: DC | PRN

## 2020-12-15 MED ORDER — ALBUTEROL SULFATE (2.5 MG/3ML) 0.083% IN NEBU: 2.5000 mg | INHALATION_SOLUTION | Freq: Four times a day (QID) | RESPIRATORY_TRACT | 11 refills | Status: DC | PRN

## 2020-12-15 NOTE — Telephone Encounter (Signed)
Pt called back, pharmacy is [facility].  [phone]

## 2020-12-15 NOTE — Telephone Encounter (Signed)
Rxs for albuterol and ipratropium both sent to [facility]  I have left her a detailed msg letting her know this was done.

## 2020-12-15 NOTE — Telephone Encounter (Signed)
LMTCB- need to verify pharmacy

## 2020-12-17 DIAGNOSIS — E1165 Type 2 diabetes mellitus with hyperglycemia: Secondary | ICD-10-CM | POA: Diagnosis not present

## 2020-12-17 DIAGNOSIS — I10 Essential (primary) hypertension: Secondary | ICD-10-CM | POA: Diagnosis not present

## 2020-12-17 DIAGNOSIS — B373 Candidiasis of vulva and vagina: Secondary | ICD-10-CM | POA: Diagnosis not present

## 2020-12-22 ENCOUNTER — Ambulatory Visit: Payer: PPO | Admitting: Pulmonary Disease

## 2020-12-22 ENCOUNTER — Other Ambulatory Visit: Payer: Self-pay

## 2020-12-22 DIAGNOSIS — E669 Obesity, unspecified: Secondary | ICD-10-CM

## 2020-12-22 DIAGNOSIS — R5382 Chronic fatigue, unspecified: Secondary | ICD-10-CM

## 2020-12-22 DIAGNOSIS — G9332 Myalgic encephalomyelitis/chronic fatigue syndrome: Secondary | ICD-10-CM

## 2020-12-22 HISTORY — DX: Myalgic encephalomyelitis/chronic fatigue syndrome: G93.32

## 2020-12-22 HISTORY — DX: Chronic fatigue, unspecified: R53.82

## 2020-12-22 HISTORY — DX: Obesity, unspecified: E66.9

## 2020-12-23 ENCOUNTER — Other Ambulatory Visit: Payer: Self-pay

## 2020-12-23 ENCOUNTER — Encounter: Payer: Self-pay | Admitting: Cardiology

## 2020-12-23 ENCOUNTER — Ambulatory Visit: Payer: PPO | Admitting: Cardiology

## 2020-12-23 VITALS — BP 132/88 | HR 98 | Ht 65.0 in | Wt 183.8 lb

## 2020-12-23 DIAGNOSIS — I2510 Atherosclerotic heart disease of native coronary artery without angina pectoris: Secondary | ICD-10-CM

## 2020-12-23 DIAGNOSIS — E782 Mixed hyperlipidemia: Secondary | ICD-10-CM | POA: Diagnosis not present

## 2020-12-23 NOTE — Telephone Encounter (Signed)
error 

## 2020-12-23 NOTE — Progress Notes (Signed)
Cardiology Office Note:    Date:  12/23/2020   ID:  Brenda Cochran, DOB 03/07/1951, MRN [id number]  PCP:  Imagene Riches, NP  Cardiologist:  Jenean Lindau, MD   Referring MD: Imagene Riches, NP    ASSESSMENT:    No diagnosis found. PLAN:    In order of problems listed above:  Coronary artery disease: Patient is experiencing easy fatigability and wants to be evaluated for objective evidence of obstructive coronary artery disease.  I respect her wishes.  She is concerned about this.  We will do a Lexiscan sestamibi to assess this.  Sublingual nitroglycerin prescription was sent, its protocol and 911 protocol explained and the patient vocalized understanding questions were answered to the patient's satisfaction Essential hypertension: Blood pressure stable and diet was emphasized. Mixed dyslipidemia and diabetes mellitus: Patient is really not very compliant with medical advice.  She does not exercise on a regular basis and she is overweight.  I discussed risks and she understands.  She plans to do better.  Last hemoglobin A1c on the KPN sheet is greater than 8. Patient will be seen in follow-up appointment in 6 months or earlier if the patient has any concerns Patient   Medication Adjustments/Labs and Tests Ordered: Current medicines are reviewed at length with the patient today.  Concerns regarding medicines are outlined above.  No orders of the defined types were placed in this encounter.  No orders of the defined types were placed in this encounter.    No chief complaint on file.    History of Present Illness:    Brenda Cochran is a 70 y.o. female.  Patient has past medical history of coronary artery disease, essential hypertension dyslipidemia and denies any problems other than some generalized tiredness.  She is a diabetic.  No chest pain orthopnea or PND.  She tells me that she would like to undergo stress testing because she gets easy fatigability.  This happened to her  before she had a previous coronary event and she is concerned about it.  Again with exertion she has no chest pain.  She does not exercise on a regular basis is not very compliant with diet and leads a sedentary lifestyle.  At the time of my evaluation, the patient is alert awake oriented and in no distress.  Past Medical History:  Diagnosis Date   Angina pectoris ([place]) 01/10/2019   Asthma    Asthma, chronic obstructive, with acute exacerbation ([place])    [name] 01/13/2014  FEV1 76% predicted FVC 77% predicted FEV1 to FVC ratio 100% predicted FEF 25 7569% predicted CXR 01/13/14: NAD Alpha one antitrypsin 156  IgE 56 sl elevated.  RAST neg     Atherosclerotic heart disease of native coronary artery without angina pectoris    CAD (coronary artery disease) 09/23/2018   Chronic fatigue syndrome 12/22/2020   Chronic maxillary sinusitis    Chronic tension-type headache, intractable 08/07/2019   Cigarette nicotine dependence with nicotine-induced disorder 08/18/2019   Continuous dependence on cigarette smoking 06/09/2019   COPD (chronic obstructive pulmonary disease) (HCC)    COPD mixed type ([place]) 11/09/2020   COPD with acute exacerbation ([place]) 11/10/2020   Coronary artery disease involving native coronary artery of native heart without angina pectoris 07/25/2018   Cough 10/29/2019   Depression    Dyslipidemia associated with type 2 diabetes mellitus ([place]) 08/23/2018   Essential hypertension 07/18/2018   Fibromyalgia    GERD (gastroesophageal reflux disease)    Headache  Hyperlipidemia    Hypertensive heart disease without heart failure    Hypothyroidism (acquired)    Loculated pleural effusion 12/30/2019   Major depressive disorder, single episode, mild (HCC)    Menopausal and female climacteric states    Mixed hyperlipidemia    Nicotine dependence, cigarettes, with unspecified nicotine-induced disorders    NSTEMI (non-ST elevated myocardial infarction) ([place]) 07/12/2018   Numbness and tingling in left upper  extremity    Obesity 12/22/2020   OSA (obstructive sleep apnea) 01/13/2014   Sleep study 02/19/14 : AHI 5.5.  Mild sleep apnea.  Lowest SpO2 81%    Osteoarthritis    Overweight    Pain in right knee 07/04/2017   Palpitations 08/15/2019   Pleural effusion, not elsewhere classified 12/30/2019   Pneumonia due to COVID-19 virus 01/06/2020   Primary osteoarthritis    RA (rheumatoid arthritis) ([place])    [name]   Shortness of breath 10/29/2019   Skin cancer    face   Sleep apnea    cpap   TIA (transient ischemic attack) 09/14/2016   Type 2 diabetes mellitus ([place])    Type 2 diabetes mellitus with diabetic nephropathy ([place])    Vitamin D deficiency, unspecified     Past Surgical History:  Procedure Laterality Date   ABDOMINAL HYSTERECTOMY  1984   BREAST LUMPECTOMY  1976   left, benign   CHOLECYSTECTOMY  09/05/10   CORONARY STENT INTERVENTION N/A 07/12/2018   Procedure: CORONARY STENT INTERVENTION;  Surgeon: Jettie Booze, MD;  Location: [facility];  Service: Cardiovascular;  Laterality: N/A;   CORONARY THROMBECTOMY N/A 07/12/2018   Procedure: Coronary Thrombectomy;  Surgeon: Jettie Booze, MD;  Location: [facility];  Service: Cardiovascular;  Laterality: N/A;   INTRAVASCULAR PRESSURE WIRE/FFR STUDY N/A 01/21/2019   Procedure: INTRAVASCULAR PRESSURE WIRE/FFR STUDY;  Surgeon: Jettie Booze, MD;  Location: [facility];  Service: Cardiovascular;  Laterality: N/A;   LEFT HEART CATH AND CORONARY ANGIOGRAPHY N/A 07/12/2018   Procedure: LEFT HEART CATH AND CORONARY ANGIOGRAPHY;  Surgeon: Jettie Booze, MD;  Location: [facility];  Service: Cardiovascular;  Laterality: N/A;   LEFT HEART CATH AND CORONARY ANGIOGRAPHY N/A 01/21/2019   Procedure: LEFT HEART CATH AND CORONARY ANGIOGRAPHY;  Surgeon: Jettie Booze, MD;  Location: [facility];  Service: Cardiovascular;  Laterality: N/A;    Current Medications: Current Meds  Medication Sig    albuterol (PROVENTIL) (2.5 MG/3ML) 0.083% nebulizer solution Take 3 mLs (2.5 mg total) by nebulization every 6 (six) hours as needed for wheezing or shortness of breath.   albuterol (VENTOLIN HFA) 108 (90 Base) MCG/ACT inhaler Inhale 1-2 puffs into the lungs every 6 (six) hours as needed for wheezing or shortness of breath.   aspirin EC 81 MG tablet Take 1 tablet (81 mg total) by mouth daily.   Budeson-Glycopyrrol-Formoterol (BREZTRI AEROSPHERE) 160-9-4.8 MCG/ACT AERO Inhale 2 puffs into the lungs 2 (two) times daily.   buPROPion (ZYBAN) 150 MG 12 hr tablet Take 1 tablet (150 mg total) by mouth 2 (two) times daily.   escitalopram (LEXAPRO) 10 MG tablet Take 10 mg by mouth daily.   guaiFENesin-dextromethorphan (ROBITUSSIN DM) 100-10 MG/5ML syrup Take 10 mLs by mouth every 4 (four) hours as needed for cough.   ipratropium (ATROVENT) 0.02 % nebulizer solution Take 2.5 mLs (0.5 mg total) by nebulization every 6 (six) hours as needed for wheezing or shortness of breath.   ipratropium-albuterol (DUONEB) 0.5-2.5 (3) MG/3ML SOLN  Take 3 mLs by nebulization every 6 (six) hours as needed for wheezing or shortness of breath.   JARDIANCE 25 MG TABS tablet Take 25 mg by mouth daily.   leflunomide (ARAVA) 20 MG tablet Take 20 mg by mouth daily.   metoprolol succinate (TOPROL XL) 25 MG 24 hr tablet Take 1 tablet (25 mg total) by mouth daily.   nitroGLYCERIN (NITROSTAT) 0.4 MG SL tablet Place 1 tablet (0.4 mg total) under the tongue every 5 (five) minutes x 3 doses as needed for chest pain.   nystatin (MYCOSTATIN/NYSTOP) powder Apply 1 application topically 2 (two) times daily.   rosuvastatin (CRESTOR) 10 MG tablet Take 10 mg by mouth daily.   TRULICITY 3.32 RJ/1.8AC SOPN Inject 0.75 mg into the skin once a week.   WEEKLY-D 1.25 MG (50000 UT) capsule Take 50,000 Units by mouth once a week.     Allergies:   Shellfish allergy, Contrast media [iodinated diagnostic agents], Metformin and related, Amitriptyline hcl,  Cymbalta [duloxetine hcl], Oxycontin [oxycodone hcl], and Ultram [tramadol]   Social History   Socioeconomic History   Marital status: Married    Spouse name: Not on file   Number of children: 6   Years of education: Not on file   Highest education level: Not on file  Occupational History   Occupation: disabled    Comment: jockey  Tobacco Use   Smoking status: Former    Packs/day: 4.00    Years: 30.00    Pack years: 120.00    Types: Cigarettes   Smokeless tobacco: Never   Tobacco comments:    Quit 2 weeks ago, 11/15/2020  Vaping Use   Vaping Use: Never used  Substance and Sexual Activity   Alcohol use: No   Drug use: No   Sexual activity: Not on file  Other Topics Concern   Not on file  Social History Narrative   Lives with [place] husband.   Disabled   Social Determinants of Health   Financial Resource Strain: Not on file  Food Insecurity: Not on file  Transportation Needs: Not on file  Physical Activity: Not on file  Stress: Not on file  Social Connections: Not on file     Family History: The patient's family history includes Bone cancer in her father; Breast cancer in her sister; CAD in an other family member; COPD in her brother; Diabetes in an other family member; Hypertension in an other family member; Lung cancer in her father; Lung disease in her maternal uncle, maternal uncle, and mother; Lymphoma in her sister; Multiple sclerosis in her brother; Muscular dystrophy in her brother; Seizures in an other family member. There is no history of Colon cancer, Colon polyps, Esophageal cancer, Stomach cancer, or Rectal cancer.  ROS:   Please see the history of present illness.    All other systems reviewed and are negative.  EKGs/Labs/Other Studies Reviewed:    The following studies were reviewed today: Study Highlights    Patch Wear Time:  13 days and 14 hours (2022-03-25T14:30:21-0400 to 2022-04-08T05:29:13-0400)   Patient had a min HR of 59 bpm, max HR  of 187 bpm, and avg HR of 87 bpm.   Predominant underlying rhythm was Sinus Rhythm.   6 Supraventricular Tachycardia runs occurred, the run with the fastest interval lasting 20 beats with a max rate of 187 bpm (avg 172 bpm); the run with the fastest interval was also the longest. Isolated SVEs were rare (<1.0%), SVE Couplets were rare (<1.0%), and SVE Triplets  were rare (<1.0%).   Isolated VEs were rare (<1.0%), VE Couplets were rare (<1.0%), and no VE Triplets were present.   Impression: Mildly abnormal but largely unremarkable event monitor.   Recent Labs: 11/09/2020: B Natriuretic Peptide 32.5 11/10/2020: ALT 23; Magnesium 2.2 11/11/2020: BUN 7; Creatinine, Ser 0.61; Hemoglobin 13.8; Platelets 285; Potassium 4.0; Sodium 138  Recent Lipid Panel    Component Value Date/Time   CHOL 214 (H) 06/18/2020 0925   TRIG 268 (H) 06/18/2020 0925   HDL 42 06/18/2020 0925   CHOLHDL 5.1 (H) 06/18/2020 0925   CHOLHDL 5.7 07/12/2018 0333   VLDL 29 07/12/2018 0333   LDLCALC 125 (H) 06/18/2020 0925    Physical Exam:    VS:  BP 132/88   Pulse 98   Ht 5\' 5"  (1.651 m)   Wt 183 lb 12.8 oz (83.4 kg)   SpO2 98%   BMI 30.59 kg/m     Wt Readings from Last 3 Encounters:  12/23/20 183 lb 12.8 oz (83.4 kg)  11/15/20 182 lb 9.6 oz (82.8 kg)  11/10/20 175 lb 14.8 oz (79.8 kg)     GEN: Patient is in no acute distress HEENT: Normal NECK: No JVD; No carotid bruits LYMPHATICS: No lymphadenopathy CARDIAC: Hear sounds regular, 2/6 systolic murmur at the apex. RESPIRATORY:  Clear to auscultation without rales, wheezing or rhonchi  ABDOMEN: Soft, non-tender, non-distended MUSCULOSKELETAL:  No edema; No deformity  SKIN: Warm and dry NEUROLOGIC:  Alert and oriented x 3 PSYCHIATRIC:  Normal affect   Signed, Jenean Lindau, MD  12/23/2020 2:49 PM    [place]

## 2020-12-23 NOTE — Patient Instructions (Signed)
Medication Instructions:  No medication changes. *If you need a refill on your cardiac medications before your next appointment, please call your pharmacy*   Lab Work: None ordered If you have labs (blood work) drawn today and your tests are completely normal, you will receive your results only by: [place] (if you have MyChart) OR A paper copy in the mail If you have any lab test that is abnormal or we need to change your treatment, we will call you to review the results.   Testing/Procedures: Your physician has requested that you have a lexiscan myoview. For further information please visit [email or web address]. Please follow instruction sheet, as given.  The test will take approximately 3 to 4 hours to complete; you may bring reading material.  If someone comes with you to your appointment, they will need to remain in the main lobby due to limited space in the testing area. **If you are pregnant or breastfeeding, please notify the nuclear lab prior to your appointment**  How to prepare for your Myocardial Perfusion Test: Do not eat or drink 3 hours prior to your test, except you may have water. Do not consume products containing caffeine (regular or decaffeinated) 12 hours prior to your test. (ex: coffee, chocolate, sodas, tea). Do bring a list of your current medications with you.  If not listed below, you may take your medications as normal. Do wear comfortable clothes (no dresses or overalls) and walking shoes, tennis shoes preferred (No heels or open toe shoes are allowed). Do NOT wear cologne, perfume, aftershave, or lotions (deodorant is allowed). If these instructions are not followed, your test will have to be rescheduled.    Follow-Up: At [facility], you and your health needs are our priority.  As part of our continuing mission to provide you with exceptional heart care, we have created designated Provider Care Teams.  These Care Teams include your primary  Cardiologist (physician) and Advanced Practice Providers (APPs -  Physician Assistants and Nurse Practitioners) who all work together to provide you with the care you need, when you need it.  We recommend signing up for the patient portal called "MyChart".  Sign up information is provided on this After Visit Summary.  MyChart is used to connect with patients for Virtual Visits (Telemedicine).  Patients are able to view lab/test results, encounter notes, upcoming appointments, etc.  Non-urgent messages can be sent to your provider as well.   To learn more about what you can do with MyChart, go to [email or web address].    Your next appointment:   6 month(s)  The format for your next appointment:   In Person  Provider:   Jyl Heinz, MD   Other Instructions Cardiac Nuclear Scan A cardiac nuclear scan is a test that is done to check the flow of blood to your heart. It is done when you are resting and when you are exercising. The test looks for problems such as: Not enough blood reaching a portion of the heart. The heart muscle not working as it should. You may need this test if: You have heart disease. You have had lab results that are not normal. You have had heart surgery or a balloon procedure to open up blocked arteries (angioplasty). You have chest pain. You have shortness of breath. In this test, a special dye (tracer) is put into your bloodstream. The tracer will travel to your heart. A camera will then take pictures of your heart to see how the tracer moves through your heart.  This test is usually done at a hospital and takes 2-4 hours. Tell a doctor about: Any allergies you have. All medicines you are taking, including vitamins, herbs, eye drops, creams, and over-the-counter medicines. Any problems you or family members have had with anesthetic medicines. Any blood disorders you have. Any surgeries you have had. Any medical conditions you have. Whether you are pregnant or  may be pregnant. What are the risks? Generally, this is a safe test. However, problems may occur, such as: Serious chest pain and heart attack. This is only a risk if the stress portion of the test is done. Rapid heartbeat. A feeling of warmth in your chest. This feeling usually does not last long. Allergic reaction to the tracer. What happens before the test? Ask your doctor about changing or stopping your normal medicines. This is important. Follow instructions from your doctor about what you cannot eat or drink. Remove your jewelry on the day of the test. What happens during the test? An IV tube will be inserted into one of your veins. Your doctor will give you a small amount of tracer through the IV tube. You will wait for 20-40 minutes while the tracer moves through your bloodstream. Your heart will be monitored with an electrocardiogram (ECG). You will lie down on an exam table. Pictures of your heart will be taken for about 15-20 minutes. You may also have a stress test. For this test, one of these things may be done: You will be asked to exercise on a treadmill or a stationary bike. You will be given medicines that will make your heart work harder. This is done if you are unable to exercise. When blood flow to your heart has peaked, a tracer will again be given through the IV tube. After 20-40 minutes, you will get back on the exam table. More pictures will be taken of your heart. Depending on the tracer that is used, more pictures may need to be taken 3-4 hours later. Your IV tube will be removed when the test is over. The test may vary among doctors and hospitals. What happens after the test? Ask your doctor: Whether you can return to your normal schedule, including diet, activities, and medicines. Whether you should drink more fluids. This will help to remove the tracer from your body. Drink enough fluid to keep your pee (urine) pale yellow. Ask your doctor, or the department  that is doing the test: When will my results be ready? How will I get my results? Summary A cardiac nuclear scan is a test that is done to check the flow of blood to your heart. Tell your doctor whether you are pregnant or may be pregnant. Before the test, ask your doctor about changing or stopping your normal medicines. This is important. Ask your doctor whether you can return to your normal activities. You may be asked to drink more fluids. This information is not intended to replace advice given to you by your health care provider. Make sure you discuss any questions you have with your health care provider. Document Revised: 09/11/2018 Document Reviewed: 11/05/2017 Elsevier Patient Education  [place].

## 2020-12-27 ENCOUNTER — Telehealth (HOSPITAL_COMMUNITY): Payer: Self-pay | Admitting: *Deleted

## 2020-12-27 NOTE — Telephone Encounter (Signed)
Patient given detailed instructions per Myocardial Perfusion Study Information Sheet for the test on 12/29/20 Patient notified to arrive 15 minutes early and that it is imperative to arrive on time for appointment to keep from having the test rescheduled.  If you need to cancel or reschedule your appointment, please call the office within 24 hours of your appointment. . Patient verbalized understanding. Delories Mauri Jacqueline   

## 2020-12-29 ENCOUNTER — Ambulatory Visit (INDEPENDENT_AMBULATORY_CARE_PROVIDER_SITE_OTHER): Payer: PPO

## 2020-12-29 ENCOUNTER — Other Ambulatory Visit: Payer: Self-pay

## 2020-12-29 VITALS — Ht 65.0 in | Wt 183.0 lb

## 2020-12-29 DIAGNOSIS — R110 Nausea: Secondary | ICD-10-CM | POA: Diagnosis not present

## 2020-12-29 DIAGNOSIS — I2510 Atherosclerotic heart disease of native coronary artery without angina pectoris: Secondary | ICD-10-CM

## 2020-12-29 DIAGNOSIS — E782 Mixed hyperlipidemia: Secondary | ICD-10-CM | POA: Diagnosis not present

## 2020-12-29 LAB — MYOCARDIAL PERFUSION IMAGING
LV dias vol: 93 mL (ref 46–106)
LV sys vol: 38 mL
Peak HR: 111 {beats}/min
Rest HR: 75 {beats}/min
SDS: 5
SRS: 1
SSS: 6
TID: 1.16

## 2020-12-29 MED ORDER — TECHNETIUM TC 99M TETROFOSMIN IV KIT
10.6000 | PACK | Freq: Once | INTRAVENOUS | Status: AC | PRN
  Administered 2020-12-29: 10.6 via INTRAVENOUS

## 2020-12-29 MED ORDER — TECHNETIUM TC 99M TETROFOSMIN IV KIT
33.0000 | PACK | Freq: Once | INTRAVENOUS | Status: AC | PRN
  Administered 2020-12-29: 33 via INTRAVENOUS

## 2020-12-29 MED ORDER — AMINOPHYLLINE 25 MG/ML IV SOLN
75.0000 mg | Freq: Once | INTRAVENOUS | Status: AC
  Administered 2020-12-29: 75 mg via INTRAVENOUS

## 2020-12-29 MED ORDER — REGADENOSON 0.4 MG/5ML IV SOLN
0.4000 mg | Freq: Once | INTRAVENOUS | Status: AC
  Administered 2020-12-29: 0.4 mg via INTRAVENOUS

## 2021-01-06 DIAGNOSIS — U071 COVID-19: Secondary | ICD-10-CM | POA: Diagnosis not present

## 2021-01-24 DIAGNOSIS — E559 Vitamin D deficiency, unspecified: Secondary | ICD-10-CM | POA: Diagnosis not present

## 2021-01-24 DIAGNOSIS — E1165 Type 2 diabetes mellitus with hyperglycemia: Secondary | ICD-10-CM | POA: Diagnosis not present

## 2021-01-24 DIAGNOSIS — E785 Hyperlipidemia, unspecified: Secondary | ICD-10-CM | POA: Diagnosis not present

## 2021-01-24 DIAGNOSIS — J449 Chronic obstructive pulmonary disease, unspecified: Secondary | ICD-10-CM | POA: Diagnosis not present

## 2021-01-24 DIAGNOSIS — H9201 Otalgia, right ear: Secondary | ICD-10-CM | POA: Diagnosis not present

## 2021-01-24 DIAGNOSIS — D513 Other dietary vitamin B12 deficiency anemia: Secondary | ICD-10-CM | POA: Diagnosis not present

## 2021-01-24 DIAGNOSIS — R748 Abnormal levels of other serum enzymes: Secondary | ICD-10-CM | POA: Diagnosis not present

## 2021-01-26 DIAGNOSIS — M25561 Pain in right knee: Secondary | ICD-10-CM | POA: Diagnosis not present

## 2021-01-26 DIAGNOSIS — M150 Primary generalized (osteo)arthritis: Secondary | ICD-10-CM | POA: Diagnosis not present

## 2021-01-26 DIAGNOSIS — Z79899 Other long term (current) drug therapy: Secondary | ICD-10-CM | POA: Diagnosis not present

## 2021-01-26 DIAGNOSIS — M0579 Rheumatoid arthritis with rheumatoid factor of multiple sites without organ or systems involvement: Secondary | ICD-10-CM | POA: Diagnosis not present

## 2021-01-26 DIAGNOSIS — E663 Overweight: Secondary | ICD-10-CM | POA: Diagnosis not present

## 2021-01-26 DIAGNOSIS — Z6829 Body mass index (BMI) 29.0-29.9, adult: Secondary | ICD-10-CM | POA: Diagnosis not present

## 2021-01-26 DIAGNOSIS — R5382 Chronic fatigue, unspecified: Secondary | ICD-10-CM | POA: Diagnosis not present

## 2021-04-01 DIAGNOSIS — E119 Type 2 diabetes mellitus without complications: Secondary | ICD-10-CM | POA: Diagnosis not present

## 2021-04-01 DIAGNOSIS — D513 Other dietary vitamin B12 deficiency anemia: Secondary | ICD-10-CM | POA: Diagnosis not present

## 2021-04-01 DIAGNOSIS — M25561 Pain in right knee: Secondary | ICD-10-CM | POA: Diagnosis not present

## 2021-04-01 DIAGNOSIS — E785 Hyperlipidemia, unspecified: Secondary | ICD-10-CM | POA: Diagnosis not present

## 2021-04-01 DIAGNOSIS — J441 Chronic obstructive pulmonary disease with (acute) exacerbation: Secondary | ICD-10-CM | POA: Diagnosis not present

## 2021-04-01 DIAGNOSIS — Z23 Encounter for immunization: Secondary | ICD-10-CM | POA: Diagnosis not present

## 2021-04-01 DIAGNOSIS — Z5181 Encounter for therapeutic drug level monitoring: Secondary | ICD-10-CM | POA: Diagnosis not present

## 2021-04-01 DIAGNOSIS — Z6830 Body mass index (BMI) 30.0-30.9, adult: Secondary | ICD-10-CM | POA: Diagnosis not present

## 2021-04-16 DIAGNOSIS — R062 Wheezing: Secondary | ICD-10-CM | POA: Diagnosis not present

## 2021-04-16 DIAGNOSIS — R051 Acute cough: Secondary | ICD-10-CM | POA: Diagnosis not present

## 2021-04-18 DIAGNOSIS — M1711 Unilateral primary osteoarthritis, right knee: Secondary | ICD-10-CM | POA: Diagnosis not present

## 2021-04-20 DIAGNOSIS — J189 Pneumonia, unspecified organism: Secondary | ICD-10-CM | POA: Diagnosis not present

## 2021-04-20 DIAGNOSIS — J441 Chronic obstructive pulmonary disease with (acute) exacerbation: Secondary | ICD-10-CM | POA: Diagnosis not present

## 2021-04-27 ENCOUNTER — Telehealth: Payer: Self-pay

## 2021-04-27 NOTE — Telephone Encounter (Signed)
I have called and left a message for patient to call back and schedule an appointment with pre-visit for a recall colonoscopy.

## 2021-05-03 DIAGNOSIS — W0110XA Fall on same level from slipping, tripping and stumbling with subsequent striking against unspecified object, initial encounter: Secondary | ICD-10-CM | POA: Diagnosis not present

## 2021-05-03 DIAGNOSIS — E119 Type 2 diabetes mellitus without complications: Secondary | ICD-10-CM | POA: Diagnosis not present

## 2021-05-03 DIAGNOSIS — M545 Low back pain, unspecified: Secondary | ICD-10-CM | POA: Diagnosis not present

## 2021-05-03 DIAGNOSIS — M546 Pain in thoracic spine: Secondary | ICD-10-CM | POA: Diagnosis not present

## 2021-05-03 DIAGNOSIS — M25561 Pain in right knee: Secondary | ICD-10-CM | POA: Diagnosis not present

## 2021-05-03 DIAGNOSIS — R519 Headache, unspecified: Secondary | ICD-10-CM | POA: Diagnosis not present

## 2021-05-03 DIAGNOSIS — M79631 Pain in right forearm: Secondary | ICD-10-CM | POA: Diagnosis not present

## 2021-05-03 DIAGNOSIS — G4452 New daily persistent headache (NDPH): Secondary | ICD-10-CM | POA: Diagnosis not present

## 2021-05-03 DIAGNOSIS — J323 Chronic sphenoidal sinusitis: Secondary | ICD-10-CM | POA: Diagnosis not present

## 2021-05-03 DIAGNOSIS — D513 Other dietary vitamin B12 deficiency anemia: Secondary | ICD-10-CM | POA: Diagnosis not present

## 2021-05-03 DIAGNOSIS — M79601 Pain in right arm: Secondary | ICD-10-CM | POA: Diagnosis not present

## 2021-06-07 IMAGING — DX DG CHEST 2V
2 series · 2 of 2 positions shown · non-contrast
Comparison: CT 12/30/2019.  Chest x-ray 12/10/2019.

CLINICAL DATA: Shortness of breath.

EXAM:
CHEST - 2 VIEW

[chest pa]
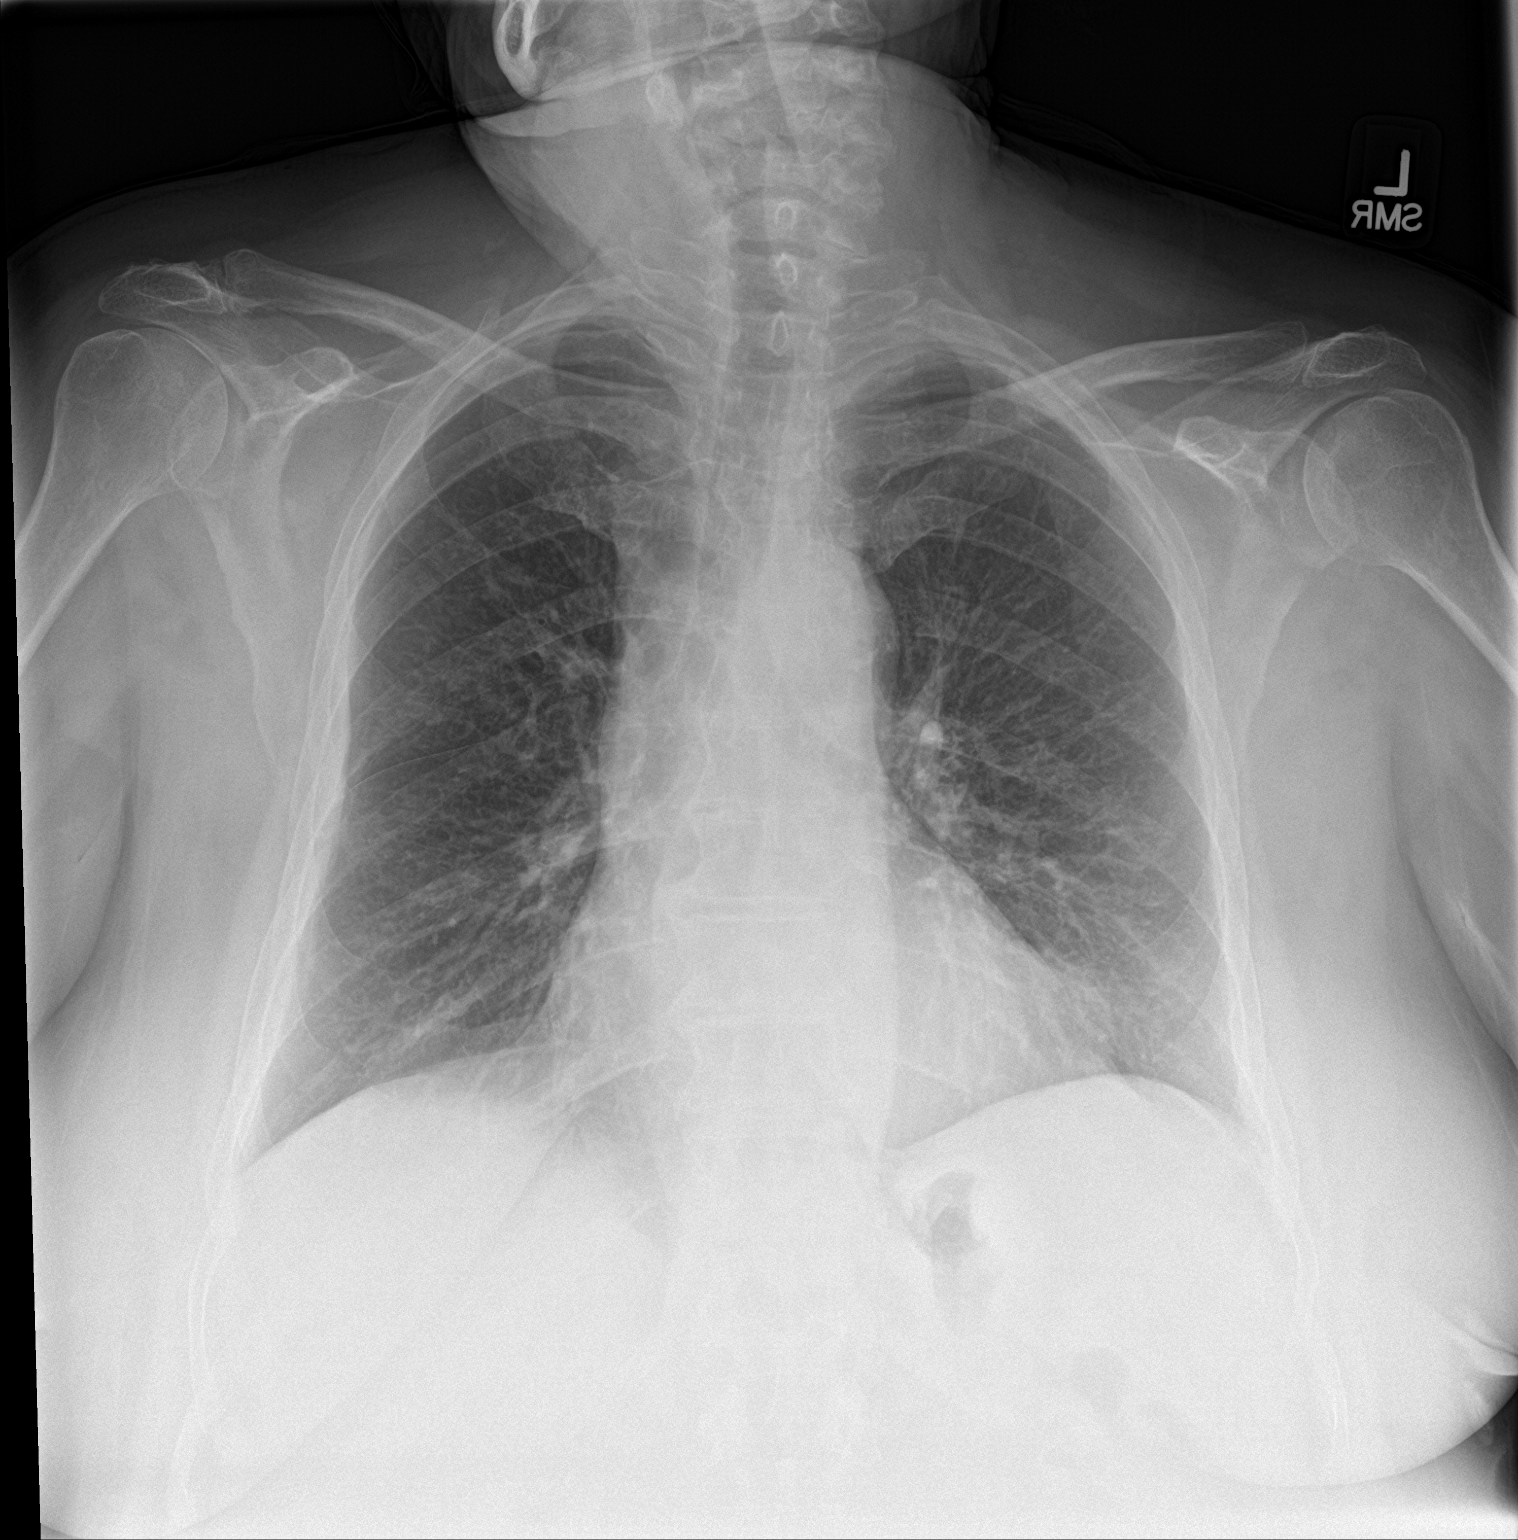

[chest lat]
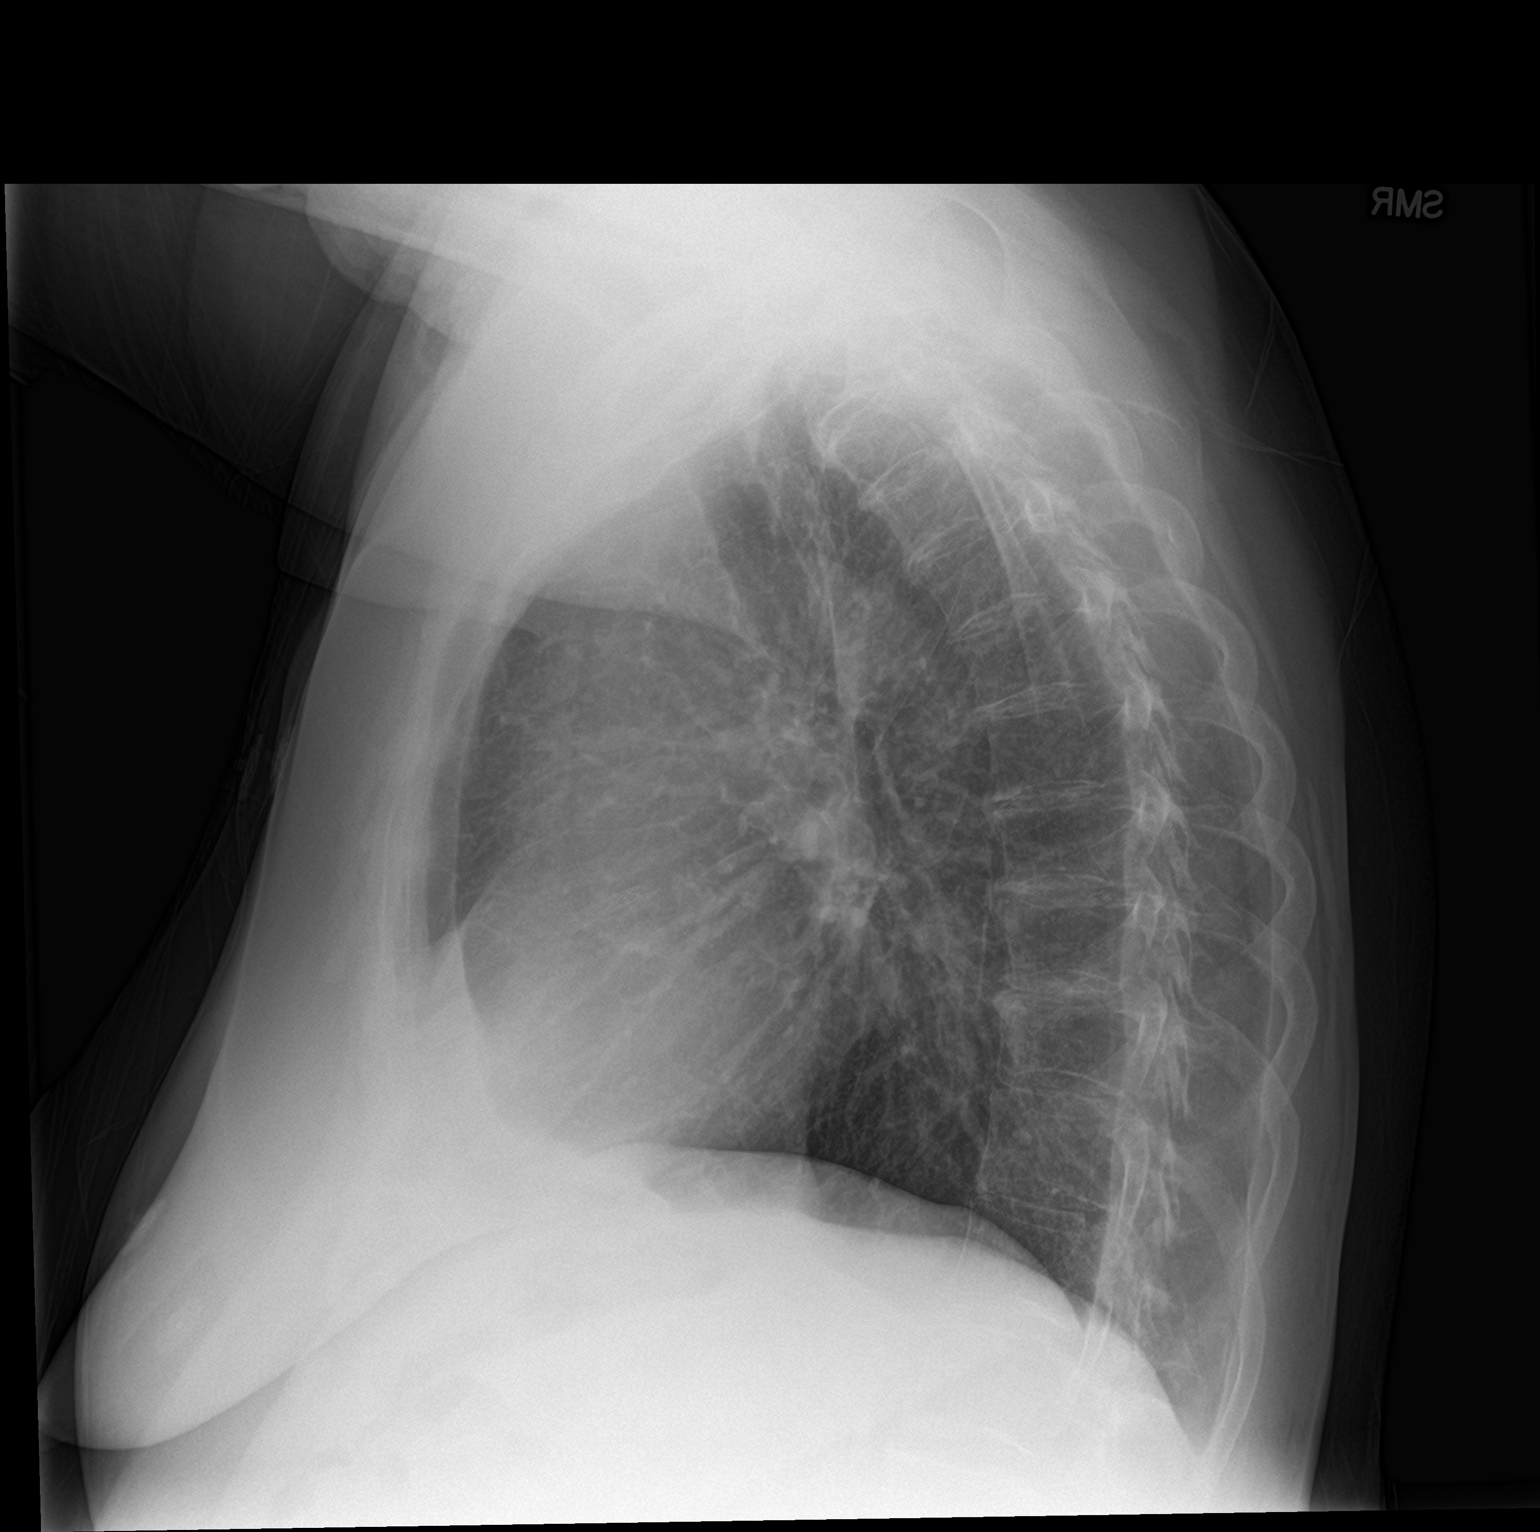

[2 of 2 positions shown; findings below may reference images not displayed]

FINDINGS: Mediastinum hilar structures normal. Cardiomegaly. Lung volumes.
Mild bilateral interstitial prominence. Mild pneumonitis cannot be
excluded. No pleural effusion or pneumothorax. Diffuse osteopenia
degenerative change.
IMPRESSION: Mild bilateral interstitial prominence. Mild pneumonitis cannot be
excluded.

## 2021-06-07 IMAGING — CT CT CHEST W/O CM
2 of 4 series · 15 of 36 positions shown, 18 images · non-contrast
Comparison: 12/30/2019

CLINICAL DATA: Shortness of breath.

EXAM:
CT CHEST WITHOUT CONTRAST
TECHNIQUE: Multidetector CT imaging of the chest was performed following the
standard protocol without IV contrast.

[Series 4: thorax 2.0 · axial · 0.93mm/px · z∈[-346,-42]mm · 12 of 170 slices shown, 15 images]
[im 9/170  mediastinal]
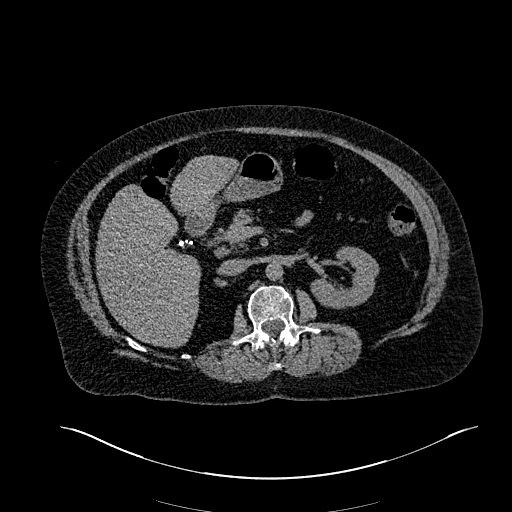
[im 9/170  lung]
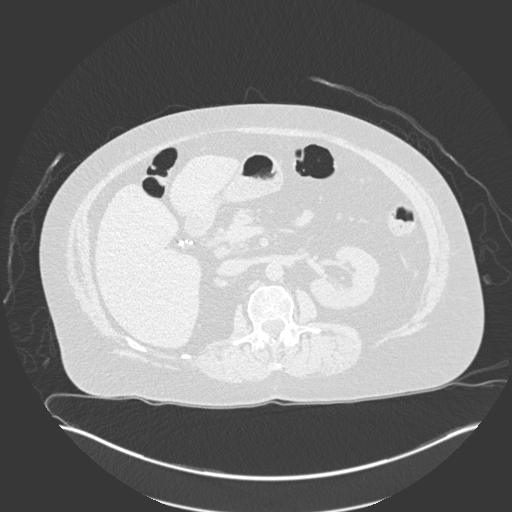
[im 27/170  lung]
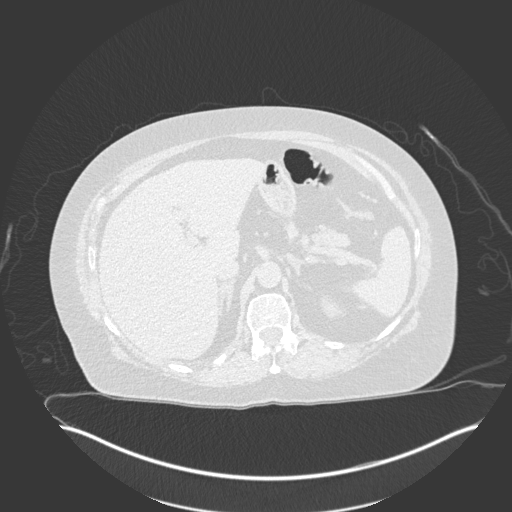
[im 36/170  lung]
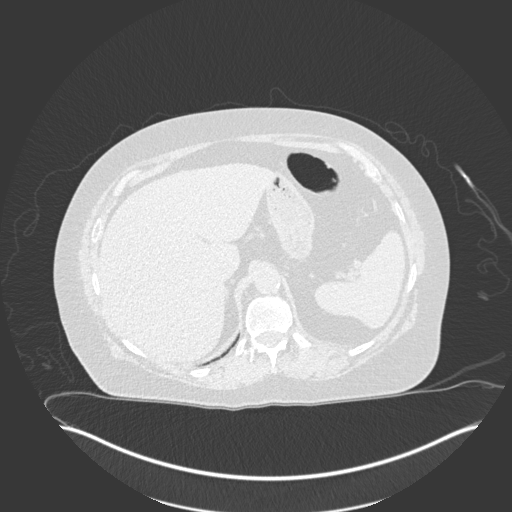
[im 54/170  lung]
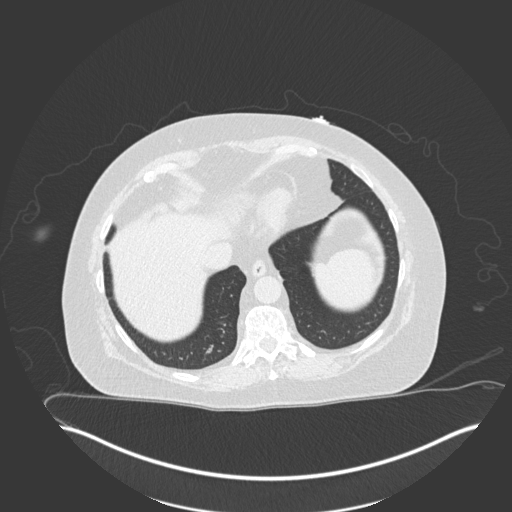
[im 63/170  mediastinal]
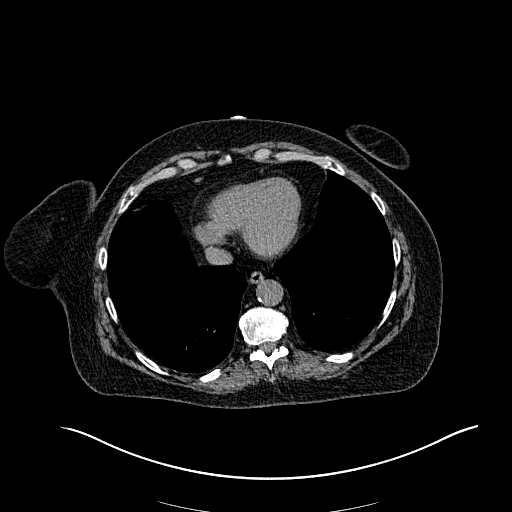
[im 63/170  lung]
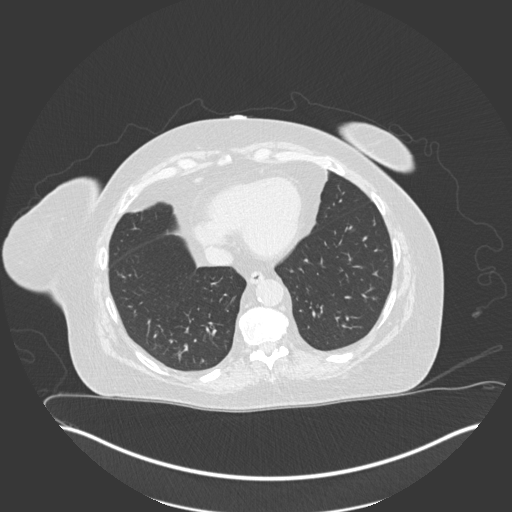
[im 81/170  lung]
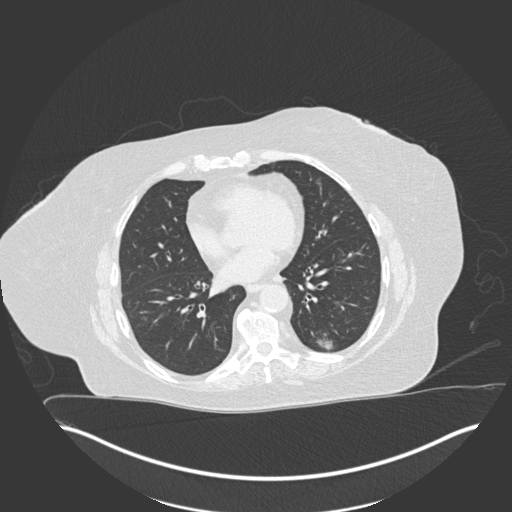
[im 89/170  lung]
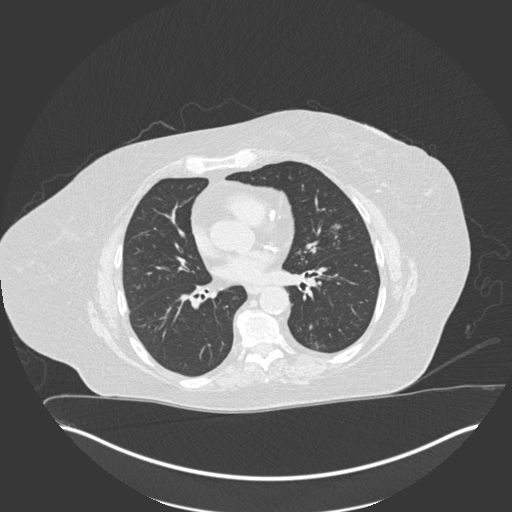
[im 107/170  lung]
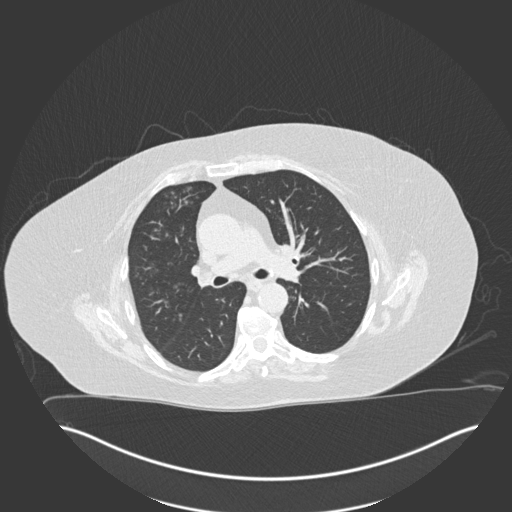
[im 116/170  mediastinal]
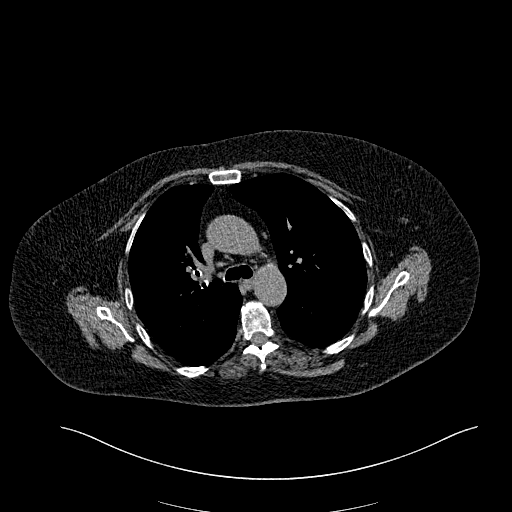
[im 116/170  lung]
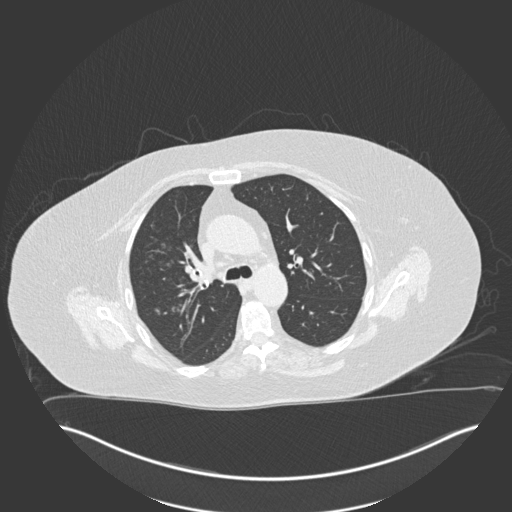
[im 134/170  lung]
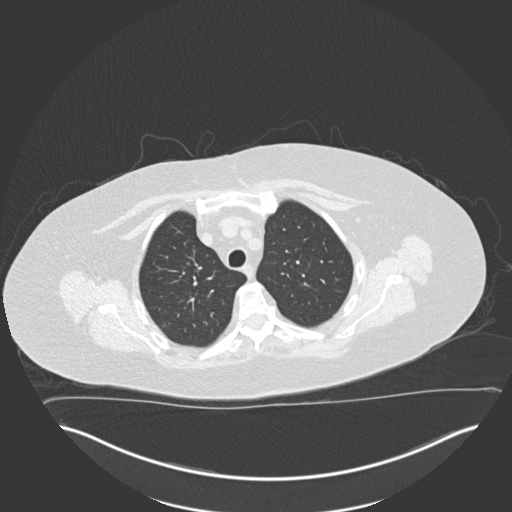
[im 143/170  lung]
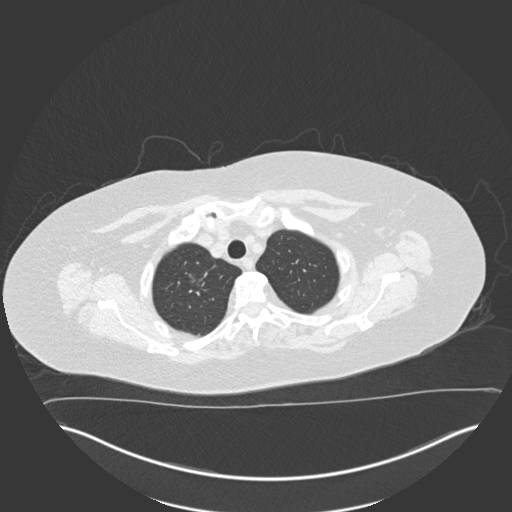
[im 161/170  lung]
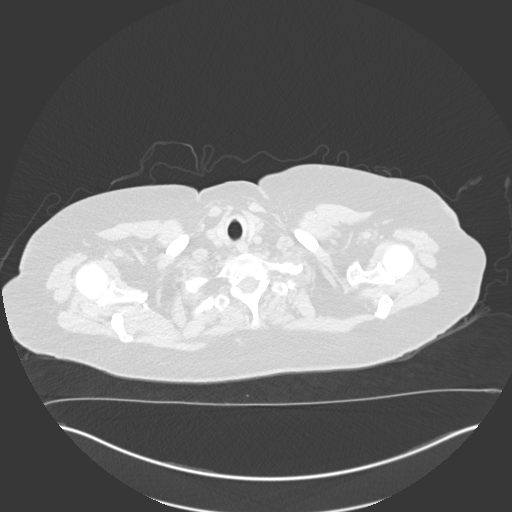

[Series 7: coronal · coronal · 0.66mm/px · 3 of 101 slices shown]
[im 21/101  lung]
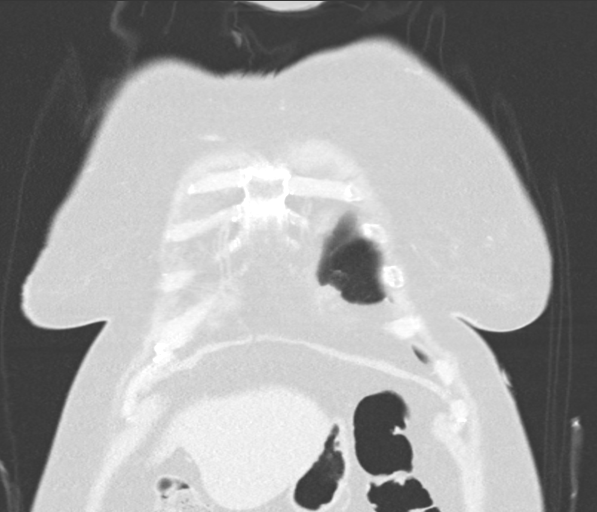
[im 41/101  lung]
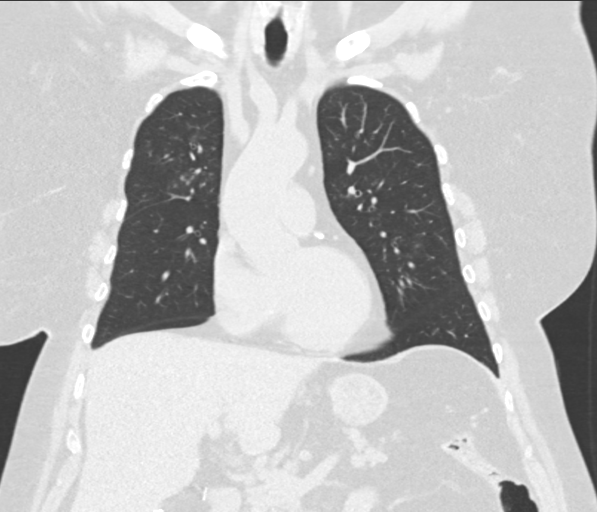
[im 61/101  lung]
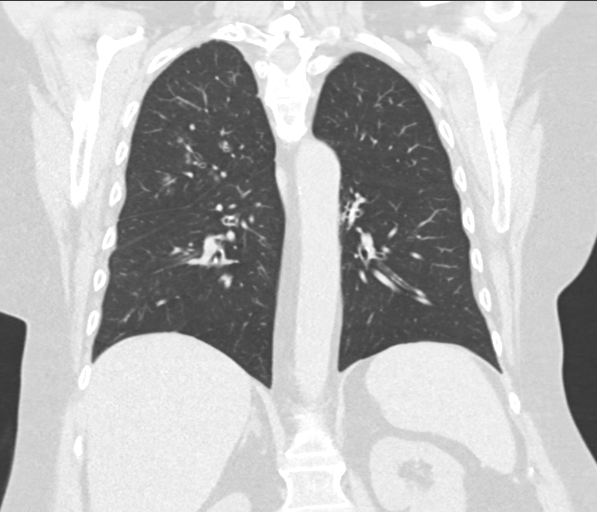

[15 of 36 positions shown; findings below may reference images not displayed]

FINDINGS: Cardiovascular: Atheromatous calcifications, including the coronary
arteries and aorta. Normal sized heart.

Mediastinum/Nodes: No enlarged mediastinal or axillary lymph nodes.
Thyroid gland, trachea, and esophagus demonstrate no significant
findings.

Lungs/Pleura: Interval mild scattered patchy opacities in both
lungs. These are involving the left lower lobe and both upper lobes.
No pleural fluid.

Upper Abdomen: Cholecystectomy clips.

Musculoskeletal: Thoracic spine degenerative changes.
IMPRESSION: 1. Atypical infection involving both upper lobes and left lower
lobe. This is not a typical pattern for pneumonia or COVID
pneumonitis. Differential considerations include early changes of
septic emboli, fungal infection and atypical mycobacterial
infection.
2.  Calcific coronary artery and aortic atherosclerosis.

Aortic Atherosclerosis ([id number]).

## 2021-07-19 ENCOUNTER — Encounter: Payer: Self-pay | Admitting: Gastroenterology

## 2021-08-30 ENCOUNTER — Other Ambulatory Visit: Payer: Self-pay

## 2021-08-30 ENCOUNTER — Encounter: Payer: Self-pay | Admitting: Cardiology

## 2021-08-30 ENCOUNTER — Ambulatory Visit: Payer: PPO | Admitting: Cardiology

## 2021-08-30 VITALS — BP 120/80 | HR 76 | Ht 63.0 in | Wt 183.4 lb

## 2021-08-30 DIAGNOSIS — Z01810 Encounter for preprocedural cardiovascular examination: Secondary | ICD-10-CM

## 2021-08-30 DIAGNOSIS — I2510 Atherosclerotic heart disease of native coronary artery without angina pectoris: Secondary | ICD-10-CM | POA: Diagnosis not present

## 2021-08-30 DIAGNOSIS — G9332 Myalgic encephalomyelitis/chronic fatigue syndrome: Secondary | ICD-10-CM

## 2021-08-30 DIAGNOSIS — F17219 Nicotine dependence, cigarettes, with unspecified nicotine-induced disorders: Secondary | ICD-10-CM

## 2021-08-30 DIAGNOSIS — G4733 Obstructive sleep apnea (adult) (pediatric): Secondary | ICD-10-CM

## 2021-08-30 DIAGNOSIS — R079 Chest pain, unspecified: Secondary | ICD-10-CM

## 2021-08-30 DIAGNOSIS — E782 Mixed hyperlipidemia: Secondary | ICD-10-CM

## 2021-08-30 DIAGNOSIS — I10 Essential (primary) hypertension: Secondary | ICD-10-CM

## 2021-08-30 DIAGNOSIS — E1121 Type 2 diabetes mellitus with diabetic nephropathy: Secondary | ICD-10-CM

## 2021-08-30 HISTORY — DX: Encounter for preprocedural cardiovascular examination: Z01.810

## 2021-08-30 MED ORDER — METOPROLOL TARTRATE 100 MG PO TABS: 100.0000 mg | ORAL_TABLET | Freq: Once | ORAL | 0 refills | Status: DC

## 2021-08-30 MED ORDER — PREDNISONE 50 MG PO TABS: ORAL_TABLET | ORAL | 0 refills | Status: DC

## 2021-08-30 NOTE — Progress Notes (Signed)
?Cardiology Office Note:   ? ?Date:  08/30/2021  ? ?ID:  Brenda Cochran, DOB 03-03-1951, MRN [id number] ? ?PCP:  Brenda Riches, NP  ?Cardiologist:  Brenda Lindau, MD  ? ?Referring MD: Brenda Riches, NP  ? ? ?ASSESSMENT:   ? ?1. Mixed hyperlipidemia   ?2. Preoperative cardiovascular examination   ?3. Essential hypertension   ?4. Coronary artery disease involving native coronary artery of native heart without angina pectoris   ?5. OSA (obstructive sleep apnea)   ?6. Nicotine dependence, cigarettes, with unspecified nicotine-induced disorders   ?7. Type 2 diabetes mellitus with diabetic nephropathy, without long-term current use of insulin ([place])   ?8. Chronic fatigue syndrome   ? ?PLAN:   ? ?In order of problems listed above: ? ?Coronary artery disease: Secondary prevention stressed with the patient.  Importance of compliance with diet and medication stressed and she vocalized understanding.  She cannot ambulate much because of orthopedic issues. ?Preoperative cardiovascular evaluation: I discussed the philosophy of this to the patient.  She has had history of nonobstructive coronary artery disease and non-STEMI in the past.  We will do a coronary CT angiography to assess this.  If this is fine then she is not at high risk for coronary events during the aforementioned surgery.  Meticulous hemodynamic monitoring will further reduce the risk of Cardiac events. ?Diabetes mellitus: Markedly elevated hemoglobin A1c and she has been instructed by primary care and orthopedic surgeon to get into a satisfactory number. ?Mixed dyslipidemia: She has not been compliant with statin therapy.  She now tells me that she takes it on a regular basis.  We will get time for complete blood work.  We will also check her hemoglobin A1c.  Compliance urged. ?Cigarette smoking: She quit in October and I congratulated her about this.  She promises never to go back to ?Patient will be seen in follow-up appointment in 6 months or earlier if the  patient has any concerns ? ? ? ?Medication Adjustments/Labs and Tests Ordered: ?Current medicines are reviewed at length with the patient today.  Concerns regarding medicines are outlined above.  ?Orders Placed This Encounter  ?Procedures  ? Basic Metabolic Panel (BMET)  ? CBC  ? TSH  ? Hepatic function panel  ? Lipid Profile  ? HgB A1c  ? Vitamin D 1,25 dihydroxy  ? ?No orders of the defined types were placed in this encounter. ? ? ? ?No chief complaint on file. ?  ? ?History of Present Illness:   ? ?Brenda Cochran is a 71 y.o. female.  Patient has past medical history of coronary artery disease, essential hypertension, mixed dyslipidemia, diabetes mellitus and history of smoking.  She quit in October.  She leads a sedentary lifestyle.  She is planning to undergo orthopedic surgical intervention for her lower extremity.  She is seen here for that evaluation.  She mentions to me that her last hemoglobin A1c also was markedly up and more than 11.  She is here for evaluation because her orthopedic surgeons are concerned about her diastolic heart failure.  At the time of my evaluation, the patient is alert awake oriented and in no distress. ? ?Past Medical History:  ?Diagnosis Date  ? Angina pectoris ([place]) 01/10/2019  ? Asthma   ? Asthma, chronic obstructive, with acute exacerbation ([place])   ? [name] 01/13/2014  FEV1 76% predicted FVC 77% predicted FEV1 to FVC ratio 100% predicted FEF 25 7569% predicted CXR 01/13/14: NAD Alpha one antitrypsin 156  IgE 56 sl elevated.  RAST neg    ? Atherosclerotic heart disease of native coronary artery without angina pectoris   ? CAD (coronary artery disease) 09/23/2018  ? Chronic fatigue syndrome 12/22/2020  ? Chronic maxillary sinusitis   ? Chronic tension-type headache, intractable 08/07/2019  ? Cigarette nicotine dependence with nicotine-induced disorder 08/18/2019  ? Continuous dependence on cigarette smoking 06/09/2019  ? COPD (chronic obstructive pulmonary disease) ([place])   ? COPD  mixed type ([place]) 11/09/2020  ? COPD with acute exacerbation ([place]) 11/10/2020  ? Coronary artery disease involving native coronary artery of native heart without angina pectoris 07/25/2018  ? Cough 10/29/2019  ? Depression   ? Dyslipidemia associated with type 2 diabetes mellitus ([place]) 08/23/2018  ? Essential hypertension 07/18/2018  ? Fibromyalgia   ? GERD (gastroesophageal reflux disease)   ? Headache   ? Hyperlipidemia   ? Hypertensive heart disease without heart failure   ? Hypothyroidism (acquired)   ? Loculated pleural effusion 12/30/2019  ? Major depressive disorder, single episode, mild ([place])   ? Menopausal and female climacteric states   ? Mixed hyperlipidemia   ? Nicotine dependence, cigarettes, with unspecified nicotine-induced disorders   ? NSTEMI (non-ST elevated myocardial infarction) ([place]) 07/12/2018  ? Numbness and tingling in left upper extremity   ? Obesity 12/22/2020  ? OSA (obstructive sleep apnea) 01/13/2014  ? Sleep study 02/19/14 : AHI 5.5.  Mild sleep apnea.  Lowest SpO2 81%   ? Overweight   ? Pain in right knee 07/04/2017  ? Palpitations 08/15/2019  ? Pleural effusion, not elsewhere classified 12/30/2019  ? Pneumonia due to COVID-19 virus 01/06/2020  ? Primary osteoarthritis   ? RA (rheumatoid arthritis) ([place])   ? [name]  ? Shortness of breath 10/29/2019  ? Skin cancer   ? face  ? Sleep apnea   ? cpap  ? TIA (transient ischemic attack) 09/14/2016  ? Type 2 diabetes mellitus ([place])   ? Type 2 diabetes mellitus with diabetic nephropathy ([place])   ? Vitamin D deficiency, unspecified   ? ? ?Past Surgical History:  ?Procedure Laterality Date  ? ABDOMINAL HYSTERECTOMY  1984  ? BREAST LUMPECTOMY  1976  ? left, benign  ? CHOLECYSTECTOMY  09/05/10  ? CORONARY STENT INTERVENTION N/A 07/12/2018  ? Procedure: CORONARY STENT INTERVENTION;  Surgeon: Jettie Booze, MD;  Location: [place] [facility];  Service: Cardiovascular;  Laterality: N/A;  ? CORONARY THROMBECTOMY N/A 07/12/2018  ? Procedure:  Coronary Thrombectomy;  Surgeon: Jettie Booze, MD;  Location: [place] [facility];  Service: Cardiovascular;  Laterality: N/A;  ? INTRAVASCULAR PRESSURE WIRE/FFR STUDY N/A 01/21/2019  ? Procedure: INTRAVASCULAR PRESSURE WIRE/FFR STUDY;  Surgeon: Jettie Booze, MD;  Location: [place];  Service: Cardiovascular;  Laterality: N/A;  ? LEFT HEART CATH AND CORONARY ANGIOGRAPHY N/A 07/12/2018  ? Procedure: LEFT HEART CATH AND CORONARY ANGIOGRAPHY;  Surgeon: Jettie Booze, MD;  Location: [place] [facility];  Service: Cardiovascular;  Laterality: N/A;  ? LEFT HEART CATH AND CORONARY ANGIOGRAPHY N/A 01/21/2019  ? Procedure: LEFT HEART CATH AND CORONARY ANGIOGRAPHY;  Surgeon: Jettie Booze, MD;  Location: [facility];  Service: Cardiovascular;  Laterality: N/A;  ? ? ?Current Medications: ?Current Meds  ?Medication Sig  ? albuterol (PROVENTIL) (2.5 MG/3ML) 0.083% nebulizer solution Take 3 mLs (2.5 mg total) by nebulization every 6 (six) hours as needed for wheezing or shortness of breath.  ? albuterol (VENTOLIN HFA) 108 (90 Base) MCG/ACT inhaler Inhale  1-2 puffs into the lungs every 6 (six) hours as needed for wheezing or shortness of breath.  ? Budeson-Glycopyrrol-Formoterol (BREZTRI AEROSPHERE) 160-9-4.8 MCG/ACT AERO Inhale 2 puffs into the lungs 2 (two) times daily.  ? Dulaglutide (TRULICITY) 1.5 BP/1.0CH SOPN Inject 1.5 mg into the skin once a week.  ? ipratropium (ATROVENT) 0.02 % nebulizer solution Take 2.5 mLs (0.5 mg total) by nebulization every 6 (six) hours as needed for wheezing or shortness of breath.  ? ipratropium-albuterol (DUONEB) 0.5-2.5 (3) MG/3ML SOLN Take 3 mLs by nebulization every 6 (six) hours as needed for wheezing or shortness of breath.  ? JARDIANCE 25 MG TABS tablet Take 25 mg by mouth daily.  ? leflunomide (ARAVA) 20 MG tablet Take 20 mg by mouth daily.  ? metoprolol succinate (TOPROL XL) 25 MG 24 hr tablet Take 1 tablet (25 mg total) by mouth daily.  ?  nitroGLYCERIN (NITROSTAT) 0.4 MG SL tablet Place 1 tablet (0.4 mg total) under the tongue every 5 (five) minutes x 3 doses as needed for chest pain.  ? nystatin (MYCOSTATIN/NYSTOP) powder Apply 1 application topica

## 2021-08-30 NOTE — Patient Instructions (Addendum)
Medication Instructions:  ?Your physician recommends that you continue on your current medications as directed. Please refer to the Current Medication list given to you today. ? ?*If you need a refill on your cardiac medications before your next appointment, please call your pharmacy* ? ? ?Lab Work: ?Your physician recommends that you return for lab work in:  ? ?Labs: BMP, CBC, TSH, LFT, Lipid, HgbA1c, Vitamin D ?Labs 1 week before CT ? ?If you have labs (blood work) drawn today and your tests are completely normal, you will receive your results only by: ?MyChart Message (if you have MyChart) OR ?A paper copy in the mail ?If you have any lab test that is abnormal or we need to change your treatment, we will call you to review the results. ? ? ?Testing/Procedures: ? ? ?Your cardiac CT will be scheduled at one of the below locations:  ? ?[facility] ?[address] ?[place], [place] [address] ?[phone] ? ?OR ? ?[place] ?[address] ?[address] ?[place], [place] [address] ?[phone] ? ?If scheduled at [facility], please arrive at the [facility] and Children's Entrance (Entrance C2) of [facility] 30 minutes prior to test start time. ?You can use the FREE valet parking offered at entrance C (encouraged to control the heart rate for the test)  ?Proceed to the [facility] Radiology Department (first floor) to check-in and test prep. ? ?All radiology patients and guests should use entrance C2 at [facility], accessed from [facility], even though the hospital's physical address listed is [address] ? ? ? ?If scheduled at [facility], please arrive 15 mins early for check-in and test prep. ? ?Please follow these instructions carefully (unless otherwise directed): ? ?On the Night Before the Test: ?Be sure to Drink plenty of water. ?Do not consume any caffeinated/decaffeinated beverages or  chocolate 12 hours prior to your test. ?Do not take any antihistamines 12 hours prior to your test. ?If the patient has contrast allergy: ?Patient will need a prescription for Prednisone and very clear instructions (as follows): ?Prednisone 50 mg - take 13 hours prior to test ?Take another Prednisone 50 mg 7 hours prior to test ?Take another Prednisone 50 mg 1 hour prior to test ?Take Benadryl 50 mg 1 hour prior to test ?Patient must complete all four doses of above prophylactic medications. ?Patient will need a ride after test due to Benadryl. ? ?On the Day of the Test: ?Drink plenty of water until 1 hour prior to the test. ?Do not eat any food 4 hours prior to the test. ?You may take your regular medications prior to the test.  ?Take metoprolol (Lopressor) two hours prior to test. ?FEMALES- please wear underwire-free bra if available, avoid dresses & tight clothing  ? ?     ?After the Test: ?Drink plenty of water. ?After receiving IV contrast, you may experience a mild flushed feeling. This is normal. ?On occasion, you may experience a mild rash up to 24 hours after the test. This is not dangerous. If this occurs, you can take Benadryl 25 mg and increase your fluid intake. ?If you experience trouble breathing, this can be serious. If it is severe call 911 IMMEDIATELY. If it is mild, please call our office. ?If you take any of these medications: Glipizide/Metformin, Avandament, Glucavance, please do not take 48 hours after completing test unless otherwise instructed. ? ?We will call to schedule your test 2-4 weeks  out understanding that some insurance companies will need an authorization prior to the service being performed.  ? ?For non-scheduling related questions, please contact the cardiac imaging nurse navigator should you have any questions/concerns: ?Marchia Bond, Cardiac Imaging Nurse Navigator ?Gordy Clement, Cardiac Imaging Nurse Navigator ?[facility] ?Direct Office Dial:  [phone]  ? ?For scheduling needs, including cancellations and rescheduling, please call [place], [phone]. ? ? ? ?Follow-Up: ?At [facility], you and your health needs are our priority.  As part of our continuing mission to provide you with exceptional heart care, we have created designated Provider Care Teams.  These Care Teams include your primary Cardiologist (physician) and Advanced Practice Providers (APPs -  Physician Assistants and Nurse Practitioners) who all work together to provide you with the care you need, when you need it. ? ?We recommend signing up for the patient portal called "MyChart".  Sign up information is provided on this After Visit Summary.  MyChart is used to connect with patients for Virtual Visits (Telemedicine).  Patients are able to view lab/test results, encounter notes, upcoming appointments, etc.  Non-urgent messages can be sent to your provider as well.   ?To learn more about what you can do with MyChart, go to [email or web address].   ? ?Your next appointment:   ?9 month(s) ? ?The format for your next appointment:   ?In Person ? ?Provider:   ?Jyl Heinz, MD  ? ? ?Other Instructions ?None ? ?

## 2021-08-31 ENCOUNTER — Other Ambulatory Visit: Payer: Self-pay | Admitting: Cardiology

## 2021-09-07 ENCOUNTER — Encounter: Payer: Self-pay | Admitting: Gastroenterology

## 2021-09-08 ENCOUNTER — Telehealth: Payer: Self-pay

## 2021-09-08 DIAGNOSIS — I2510 Atherosclerotic heart disease of native coronary artery without angina pectoris: Secondary | ICD-10-CM

## 2021-09-08 NOTE — Telephone Encounter (Signed)
-----   Message from Eli Hose, RN sent at 09/07/2021  4:23 PM EDT ----- ?Regarding: labs for CCTA ?Can you have this patient come in for the labs prior to their cardiac CT scan next Monday (April 10)?  ? ?Thanks, ?Merle ? ?

## 2021-09-08 NOTE — Telephone Encounter (Signed)
Pt will come 4/6 or 4/7 for labs. Pt verbalized understanding and had no additional questions. ?

## 2021-09-09 ENCOUNTER — Telehealth (HOSPITAL_COMMUNITY): Payer: Self-pay | Admitting: *Deleted

## 2021-09-09 LAB — BASIC METABOLIC PANEL
BUN/Creatinine Ratio: 13 (ref 12–28)
BUN: 7 mg/dL — ABNORMAL LOW (ref 8–27)
CO2: 21 mmol/L (ref 20–29)
Calcium: 9.2 mg/dL (ref 8.7–10.3)
Chloride: 96 mmol/L (ref 96–106)
Creatinine, Ser: 0.52 mg/dL — ABNORMAL LOW (ref 0.57–1.00)
Glucose: 385 mg/dL — ABNORMAL HIGH (ref 70–99)
Potassium: 3.9 mmol/L (ref 3.5–5.2)
Sodium: 134 mmol/L (ref 134–144)
eGFR: 101 mL/min/{1.73_m2} (ref >59)

## 2021-09-09 NOTE — Telephone Encounter (Signed)
Reaching out to patient to offer assistance regarding upcoming cardiac imaging study; pt verbalizes understanding of appt date/time, parking situation and where to check in, pre-test NPO status and medications ordered, and verified current allergies; name and call back number provided for further questions should they arise ? ?Gordy Clement RN Navigator Cardiac Imaging ?[facility] ?[phone] office ?[phone] cell ? ?Patient verbalized how to take 13 hour prep and metoprolol tartrate for test.  Pt will have a driver and is aware to arrive at 3:30pm ?

## 2021-09-12 ENCOUNTER — Encounter (HOSPITAL_COMMUNITY): Payer: Self-pay

## 2021-09-12 ENCOUNTER — Ambulatory Visit (HOSPITAL_COMMUNITY)
Admission: RE | Admit: 2021-09-12 | Discharge: 2021-09-12 | Disposition: A | Discharge status: Home / Self Care | Payer: PPO | Source: Ambulatory Visit | Attending: Cardiology | Admitting: Cardiology

## 2021-09-12 DIAGNOSIS — R931 Abnormal findings on diagnostic imaging of heart and coronary circulation: Secondary | ICD-10-CM | POA: Insufficient documentation

## 2021-09-12 DIAGNOSIS — Z955 Presence of coronary angioplasty implant and graft: Secondary | ICD-10-CM | POA: Insufficient documentation

## 2021-09-12 DIAGNOSIS — I2510 Atherosclerotic heart disease of native coronary artery without angina pectoris: Secondary | ICD-10-CM | POA: Insufficient documentation

## 2021-09-12 DIAGNOSIS — R079 Chest pain, unspecified: Secondary | ICD-10-CM | POA: Insufficient documentation

## 2021-09-12 DIAGNOSIS — R072 Precordial pain: Secondary | ICD-10-CM | POA: Diagnosis present

## 2021-09-12 MED ORDER — NITROGLYCERIN 0.4 MG SL SUBL
0.8000 mg | SUBLINGUAL_TABLET | Freq: Once | SUBLINGUAL | Status: AC
  Administered 2021-09-12: 0.8 mg via SUBLINGUAL

## 2021-09-12 MED ORDER — IOHEXOL 350 MG/ML SOLN
100.0000 mL | Freq: Once | INTRAVENOUS | Status: AC | PRN
  Administered 2021-09-12: 100 mL via INTRAVENOUS

## 2021-09-12 MED ORDER — NITROGLYCERIN 0.4 MG SL SUBL
SUBLINGUAL_TABLET | SUBLINGUAL | Status: AC
  Filled 2021-09-12: qty 2

## 2021-09-13 ENCOUNTER — Other Ambulatory Visit: Payer: Self-pay

## 2021-09-13 ENCOUNTER — Ambulatory Visit (HOSPITAL_COMMUNITY)
Admission: RE | Admit: 2021-09-13 | Discharge: 2021-09-13 | Disposition: A | Discharge status: Home / Self Care | Payer: PPO | Source: Ambulatory Visit | Attending: Cardiology | Admitting: Cardiology

## 2021-09-13 ENCOUNTER — Telehealth: Payer: Self-pay

## 2021-09-13 ENCOUNTER — Other Ambulatory Visit: Payer: Self-pay | Admitting: Cardiology

## 2021-09-13 ENCOUNTER — Emergency Department (HOSPITAL_COMMUNITY)
Admission: EM | Admit: 2021-09-13 | Discharge: 2021-09-14 | Disposition: A | Discharge status: Home / Self Care | Payer: PPO | Attending: Physician Assistant | Admitting: Physician Assistant

## 2021-09-13 ENCOUNTER — Emergency Department (HOSPITAL_COMMUNITY): Payer: PPO

## 2021-09-13 ENCOUNTER — Encounter (HOSPITAL_COMMUNITY): Payer: Self-pay

## 2021-09-13 DIAGNOSIS — Z5321 Procedure and treatment not carried out due to patient leaving prior to being seen by health care provider: Secondary | ICD-10-CM | POA: Diagnosis not present

## 2021-09-13 DIAGNOSIS — R931 Abnormal findings on diagnostic imaging of heart and coronary circulation: Secondary | ICD-10-CM

## 2021-09-13 DIAGNOSIS — M79602 Pain in left arm: Secondary | ICD-10-CM | POA: Insufficient documentation

## 2021-09-13 DIAGNOSIS — R072 Precordial pain: Secondary | ICD-10-CM

## 2021-09-13 DIAGNOSIS — Z20822 Contact with and (suspected) exposure to covid-19: Secondary | ICD-10-CM | POA: Insufficient documentation

## 2021-09-13 DIAGNOSIS — R079 Chest pain, unspecified: Secondary | ICD-10-CM | POA: Insufficient documentation

## 2021-09-13 LAB — COMPREHENSIVE METABOLIC PANEL
ALT: 29 U/L (ref 0–44)
AST: 28 U/L (ref 15–41)
Albumin: 3.2 g/dL — ABNORMAL LOW (ref 3.5–5.0)
Alkaline Phosphatase: 110 U/L (ref 38–126)
Anion gap: 9 (ref 5–15)
BUN: 9 mg/dL (ref 8–23)
CO2: 26 mmol/L (ref 22–32)
Calcium: 9.2 mg/dL (ref 8.9–10.3)
Chloride: 101 mmol/L (ref 98–111)
Creatinine, Ser: 0.8 mg/dL (ref 0.44–1.00)
GFR, Estimated: >60 mL/min (ref >60)
Glucose, Bld: 505 mg/dL (ref 70–99)
Potassium: 3.2 mmol/L — ABNORMAL LOW (ref 3.5–5.1)
Sodium: 136 mmol/L (ref 135–145)
Total Bilirubin: 0.4 mg/dL (ref 0.3–1.2)
Total Protein: 6.7 g/dL (ref 6.5–8.1)

## 2021-09-13 LAB — CBC WITH DIFFERENTIAL/PLATELET
Abs Immature Granulocytes: 0.01 10*3/uL (ref 0.00–0.07)
Basophils Absolute: 0.1 10*3/uL (ref 0.0–0.1)
Basophils Relative: 1 %
Eosinophils Absolute: 0.1 10*3/uL (ref 0.0–0.5)
Eosinophils Relative: 2 %
HCT: 42.7 % (ref 36.0–46.0)
Hemoglobin: 14.6 g/dL (ref 12.0–15.0)
Immature Granulocytes: 0 %
Lymphocytes Relative: 42 %
Lymphs Abs: 2.7 10*3/uL (ref 0.7–4.0)
MCH: 30.1 pg (ref 26.0–34.0)
MCHC: 34.2 g/dL (ref 30.0–36.0)
MCV: 88 fL (ref 80.0–100.0)
Monocytes Absolute: 0.4 10*3/uL (ref 0.1–1.0)
Monocytes Relative: 6 %
Neutro Abs: 3.1 10*3/uL (ref 1.7–7.7)
Neutrophils Relative %: 49 %
Platelets: 266 10*3/uL (ref 150–400)
RBC: 4.85 MIL/uL (ref 3.87–5.11)
RDW: 12.3 % (ref 11.5–15.5)
WBC: 6.4 10*3/uL (ref 4.0–10.5)
nRBC: 0 % (ref 0.0–0.2)

## 2021-09-13 LAB — RESP PANEL BY RT-PCR (FLU A&B, COVID) ARPGX2
Influenza A by PCR: NEGATIVE
Influenza B by PCR: NEGATIVE
SARS Coronavirus 2 by RT PCR: NEGATIVE

## 2021-09-13 LAB — TROPONIN I (HIGH SENSITIVITY): Troponin I (High Sensitivity): 10 ng/L (ref <18)

## 2021-09-13 NOTE — ED Triage Notes (Signed)
Pt began having chest pain at 1200pm today central dull and radiating to left arm. Feels similar to Mi she had previously. Pt also c/o SOB. PT had ct of heart yesterday and was told it was abnormal.  ?

## 2021-09-13 NOTE — Telephone Encounter (Signed)
-----   Message from Darrel Reach, CMA sent at 09/13/2021  3:47 PM EDT ----- ? ?----- Message ----- ?From: Jenean Lindau, MD ?Sent: 09/13/2021   3:20 PM EDT ?To: Truddie Hidden, RN ? ?Please make sure that patient is on aspirin and sublingual nitroglycerin as needed.  Needs appointment to discuss.  Copy primary care ?Jenean Lindau, MD 09/13/2021 3:20 PM  ? ?

## 2021-09-13 NOTE — ED Notes (Signed)
Patient in xray at this time.

## 2021-09-13 NOTE — ED Provider Triage Note (Signed)
Emergency Medicine Provider Triage Evaluation Note ? ?Brenda Cochran , a 71 y.o. female  was evaluated in triage.  Pt complains of chest pain and pain in her left arm. ?She recently had a CT coronary with calcium score obtained.  She reports she was told she has multiple blockages. ?When she spoke with her cardiologist about her results today she mentioned that she had been having chest pain.  The pain is only left side of her chest however at times is mostly in her arm.  She is having multiple episodes a day.  She has taken nitro prior to arrival. ? ? ? ?Physical Exam  ?BP (!) 147/68 (BP Location: Right Arm)   Pulse 74   Temp 97.6 ?F (36.4 ?C)   Resp 20   Ht '5\' 3"'$  (1.6 m)   Wt 83.2 kg   SpO2 100%   BMI 32.49 kg/m?  ?Gen:   Awake, no distress   ?Resp:  Normal effort  ?MSK:   Moves extremities without difficulty  ?Other:  Normal speech.  ? ?Medical Decision Making  ?Medically screening exam initiated at 8:48 PM.  Appropriate orders placed.  GLORISTINE TURRUBIATES was informed that the remainder of the evaluation will be completed by another provider, this initial triage assessment does not replace that evaluation, and the importance of remaining in the ED until their evaluation is complete. ? ?Will check labs, cxr.   ?  ?Lorin Glass, PA-C ?09/13/21 2050 ? ?

## 2021-09-13 NOTE — Progress Notes (Signed)
c 

## 2021-09-13 NOTE — ED Triage Notes (Signed)
Pt took 2 nitro pain eased from 7/10 to 2/10 ?

## 2021-09-13 NOTE — Telephone Encounter (Signed)
Spoke with pt regarding abnormal CT. PT states that she has been having chest pain and neck pain that has resolved with 2 NTG. Advised it pain returns she needs to call 911 and go to the ED at [facility] for evaluation. Pt verb understanding and had no additional questions. Pt has been scheduled to see you tomorrow.  ?

## 2021-09-14 ENCOUNTER — Ambulatory Visit (INDEPENDENT_AMBULATORY_CARE_PROVIDER_SITE_OTHER): Payer: PPO | Admitting: Cardiology

## 2021-09-14 ENCOUNTER — Encounter: Payer: Self-pay | Admitting: Cardiology

## 2021-09-14 VITALS — BP 120/80 | HR 80 | Ht 63.0 in

## 2021-09-14 DIAGNOSIS — E1121 Type 2 diabetes mellitus with diabetic nephropathy: Secondary | ICD-10-CM

## 2021-09-14 DIAGNOSIS — I2510 Atherosclerotic heart disease of native coronary artery without angina pectoris: Secondary | ICD-10-CM

## 2021-09-14 DIAGNOSIS — I209 Angina pectoris, unspecified: Secondary | ICD-10-CM

## 2021-09-14 DIAGNOSIS — E782 Mixed hyperlipidemia: Secondary | ICD-10-CM | POA: Diagnosis not present

## 2021-09-14 LAB — TROPONIN I (HIGH SENSITIVITY): Troponin I (High Sensitivity): 11 ng/L (ref <18)

## 2021-09-14 MED ORDER — PREDNISONE 50 MG PO TABS: ORAL_TABLET | ORAL | 0 refills | Status: DC

## 2021-09-14 NOTE — ED Notes (Signed)
Patients daughter states patient has cardiology appt at 10am and will follow up with them. Advised patient to stay. Patient leaving ?

## 2021-09-14 NOTE — H&P (View-Only) (Signed)
?Cardiology Office Note:   ? ?Date:  09/14/2021  ? ?ID:  Brenda Cochran, DOB [birth year], MRN [id number] ? ?PCP:  Imagene Riches, NP  ?Cardiologist:  Jenean Lindau, MD  ? ?Referring MD: Imagene Riches, NP  ? ? ?ASSESSMENT:   ? ?1. Angina pectoris ([place])   ?2. Coronary artery disease involving native coronary artery of native heart, unspecified whether angina present   ?3. Mixed hyperlipidemia   ?4. Type 2 diabetes mellitus with diabetic nephropathy, without long-term current use of insulin ([place])   ? ?PLAN:   ? ?In order of problems listed above: ? ?Angina pectoris: Coronary artery disease: Report of the CT coronary angiography discussed with the patient and daughter and granddaughter extensive length and questions were answered to their satisfaction.  Sublingual nitroglycerin prescription was sent, its protocol and 911 protocol explained and the patient vocalized understanding questions were answered to the patient's satisfaction.  Coronary angiography and left heart catheterization was discussed.I discussed coronary angiography and left heart catheterization with the patient at extensive length. Procedure, benefits and potential risks were explained. Patient had multiple questions which were answered to the patient's satisfaction. Patient agreed and consented for the procedure. Further recommendations will be made based on the findings of the coronary angiography. In the interim. The patient has any significant symptoms he knows to go to the nearest emergency room. ?Uncontrolled blood sugar and diabetes: She will talk to her primary care about medication adjustment.  I discussed this with her at length.  Diet was emphasized. ?Mixed dyslipidemia: On statin therapy and guideline directed therapy has been followed.  Further recommendations will depend on the findings of the coronary angiography.  Patient and family had multiple questions which were answered to their satisfaction. ? ? ?Medication Adjustments/Labs and  Tests Ordered: ?Current medicines are reviewed at length with the patient today.  Concerns regarding medicines are outlined above.  ?No orders of the defined types were placed in this encounter. ? ?No orders of the defined types were placed in this encounter. ? ? ? ?Chief Complaint  ?Patient presents with  ? Follow-up  ?  ? ?History of Present Illness:   ? ?Brenda Cochran is a 71 y.o. female.  Patient has past medical history of coronary artery disease, essential hypertension, dyslipidemia and uncontrolled diabetes mellitus.  She has been experiencing chest pain.  For this reason a CT scan with angiography of the coronary arteries was done.  Results are noted below.  This was of significant concern.  Patient has been advised of this.  She went to the emergency room yesterday with chest pain.  Her pain subsided.  Her blood test was negative at least the initial testing for troponins.  She was told of 8 more hours of waiting in the emergency room and went home.  Currently she denies any chest pain.  At the time of my evaluation, the patient is alert awake oriented and in no distress. ? ?Past Medical History:  ?Diagnosis Date  ? Angina pectoris ([place]) 01/10/2019  ? Asthma   ? Asthma, chronic obstructive, with acute exacerbation ([place])   ? [name] 01/13/2014  FEV1 76% predicted FVC 77% predicted FEV1 to FVC ratio 100% predicted FEF 25 7569% predicted CXR 01/13/14: NAD Alpha one antitrypsin 156  IgE 56 sl elevated.  RAST neg    ? Atherosclerotic heart disease of native coronary artery without angina pectoris   ? CAD (coronary artery disease) 09/23/2018  ? Chronic fatigue syndrome 12/22/2020  ? Chronic  maxillary sinusitis   ? Chronic tension-type headache, intractable 08/07/2019  ? Cigarette nicotine dependence with nicotine-induced disorder 08/18/2019  ? Continuous dependence on cigarette smoking 06/09/2019  ? COPD (chronic obstructive pulmonary disease) ([place])   ? COPD mixed type ([place]) 11/09/2020  ? COPD with acute exacerbation  ([place]) 11/10/2020  ? Coronary artery disease involving native coronary artery of native heart without angina pectoris 07/25/2018  ? Cough 10/29/2019  ? Depression   ? Dyslipidemia associated with type 2 diabetes mellitus ([place]) 08/23/2018  ? Essential hypertension 07/18/2018  ? Fibromyalgia   ? GERD (gastroesophageal reflux disease)   ? Headache   ? Hyperlipidemia   ? Hypertensive heart disease without heart failure   ? Hypothyroidism (acquired)   ? Loculated pleural effusion 12/30/2019  ? Major depressive disorder, single episode, mild ([place])   ? Menopausal and female climacteric states   ? Mixed hyperlipidemia   ? Nicotine dependence, cigarettes, with unspecified nicotine-induced disorders   ? NSTEMI (non-ST elevated myocardial infarction) ([place]) 07/12/2018  ? Numbness and tingling in left upper extremity   ? Obesity 12/22/2020  ? OSA (obstructive sleep apnea) 01/13/2014  ? Sleep study 02/19/14 : AHI 5.5.  Mild sleep apnea.  Lowest SpO2 81%   ? Overweight   ? Pain in right knee 07/04/2017  ? Palpitations 08/15/2019  ? Pleural effusion, not elsewhere classified 12/30/2019  ? Pneumonia due to COVID-19 virus 01/06/2020  ? Primary osteoarthritis   ? RA (rheumatoid arthritis) ([place])   ? [name]  ? Shortness of breath 10/29/2019  ? Skin cancer   ? face  ? Sleep apnea   ? cpap  ? TIA (transient ischemic attack) 09/14/2016  ? Type 2 diabetes mellitus ([place])   ? Type 2 diabetes mellitus with diabetic nephropathy ([place])   ? Vitamin D deficiency, unspecified   ? ? ?Past Surgical History:  ?Procedure Laterality Date  ? ABDOMINAL HYSTERECTOMY  1984  ? BREAST LUMPECTOMY  1976  ? left, benign  ? CHOLECYSTECTOMY  09/05/10  ? CORONARY STENT INTERVENTION N/A 07/12/2018  ? Procedure: CORONARY STENT INTERVENTION;  Surgeon: Jettie Booze, MD;  Location: [facility];  Service: Cardiovascular;  Laterality: N/A;  ? CORONARY THROMBECTOMY N/A 07/12/2018  ? Procedure: Coronary Thrombectomy;  Surgeon: Jettie Booze, MD;   Location: [facility];  Service: Cardiovascular;  Laterality: N/A;  ? INTRAVASCULAR PRESSURE WIRE/FFR STUDY N/A 01/21/2019  ? Procedure: INTRAVASCULAR PRESSURE WIRE/FFR STUDY;  Surgeon: Jettie Booze, MD;  Location: [facility];  Service: Cardiovascular;  Laterality: N/A;  ? LEFT HEART CATH AND CORONARY ANGIOGRAPHY N/A 07/12/2018  ? Procedure: LEFT HEART CATH AND CORONARY ANGIOGRAPHY;  Surgeon: Jettie Booze, MD;  Location: [facility];  Service: Cardiovascular;  Laterality: N/A;  ? LEFT HEART CATH AND CORONARY ANGIOGRAPHY N/A 01/21/2019  ? Procedure: LEFT HEART CATH AND CORONARY ANGIOGRAPHY;  Surgeon: Jettie Booze, MD;  Location: [facility];  Service: Cardiovascular;  Laterality: N/A;  ? ? ?Current Medications: ?Current Meds  ?Medication Sig  ? albuterol (PROVENTIL) (2.5 MG/3ML) 0.083% nebulizer solution Take 3 mLs (2.5 mg total) by nebulization every 6 (six) hours as needed for wheezing or shortness of breath.  ? albuterol (VENTOLIN HFA) 108 (90 Base) MCG/ACT inhaler Inhale 1-2 puffs into the lungs every 6 (six) hours as needed for wheezing or shortness of breath.  ? Budeson-Glycopyrrol-Formoterol (BREZTRI AEROSPHERE) 160-9-4.8 MCG/ACT AERO Inhale 2 puffs into the lungs 2 (two) times daily.  ? Dulaglutide (  TRULICITY) 1.5 OI/5.1GF SOPN Inject 1.5 mg into the skin once a week.  ? JARDIANCE 25 MG TABS tablet Take 25 mg by mouth daily.  ? leflunomide (ARAVA) 20 MG tablet Take 20 mg by mouth daily.  ? nitroGLYCERIN (NITROSTAT) 0.4 MG SL tablet Place 1 tablet (0.4 mg total) under the tongue every 5 (five) minutes x 3 doses as needed for chest pain.  ? nystatin (MYCOSTATIN/NYSTOP) powder Apply 1 application topically 2 (two) times daily.  ? rosuvastatin (CRESTOR) 20 MG tablet Take 20 mg by mouth at bedtime.  ? WEEKLY-D 1.25 MG (50000 UT) capsule Take 50,000 Units by mouth once a week.  ?  ? ?Allergies:   Shellfish allergy, Contrast media [iodinated contrast media], Metformin  and related, Iohexol, Amitriptyline hcl, Cymbalta [duloxetine hcl], Oxycontin [oxycodone hcl], and Ultram [tramadol]  ? ?Social History  ? ?Socioeconomic History  ? Marital status: Married  ?  Spouse name: N

## 2021-09-14 NOTE — Progress Notes (Signed)
?Cardiology Office Note:   ? ?Date:  09/14/2021  ? ?ID:  Brenda Cochran, DOB 10-Apr-1951, MRN [id number] ? ?PCP:  Imagene Riches, NP  ?Cardiologist:  Jenean Lindau, MD  ? ?Referring MD: Imagene Riches, NP  ? ? ?ASSESSMENT:   ? ?1. Angina pectoris ([place])   ?2. Coronary artery disease involving native coronary artery of native heart, unspecified whether angina present   ?3. Mixed hyperlipidemia   ?4. Type 2 diabetes mellitus with diabetic nephropathy, without long-term current use of insulin ([place])   ? ?PLAN:   ? ?In order of problems listed above: ? ?Angina pectoris: Coronary artery disease: Report of the CT coronary angiography discussed with the patient and daughter and granddaughter extensive length and questions were answered to their satisfaction.  Sublingual nitroglycerin prescription was sent, its protocol and 911 protocol explained and the patient vocalized understanding questions were answered to the patient's satisfaction.  Coronary angiography and left heart catheterization was discussed.I discussed coronary angiography and left heart catheterization with the patient at extensive length. Procedure, benefits and potential risks were explained. Patient had multiple questions which were answered to the patient's satisfaction. Patient agreed and consented for the procedure. Further recommendations will be made based on the findings of the coronary angiography. In the interim. The patient has any significant symptoms he knows to go to the nearest emergency room. ?Uncontrolled blood sugar and diabetes: She will talk to her primary care about medication adjustment.  I discussed this with her at length.  Diet was emphasized. ?Mixed dyslipidemia: On statin therapy and guideline directed therapy has been followed.  Further recommendations will depend on the findings of the coronary angiography.  Patient and family had multiple questions which were answered to their satisfaction. ? ? ?Medication Adjustments/Labs and  Tests Ordered: ?Current medicines are reviewed at length with the patient today.  Concerns regarding medicines are outlined above.  ?No orders of the defined types were placed in this encounter. ? ?No orders of the defined types were placed in this encounter. ? ? ? ?Chief Complaint  ?Patient presents with  ? Follow-up  ?  ? ?History of Present Illness:   ? ?Brenda Cochran is a 70 y.o. female.  Patient has past medical history of coronary artery disease, essential hypertension, dyslipidemia and uncontrolled diabetes mellitus.  She has been experiencing chest pain.  For this reason a CT scan with angiography of the coronary arteries was done.  Results are noted below.  This was of significant concern.  Patient has been advised of this.  She went to the emergency room yesterday with chest pain.  Her pain subsided.  Her blood test was negative at least the initial testing for troponins.  She was told of 8 more hours of waiting in the emergency room and went home.  Currently she denies any chest pain.  At the time of my evaluation, the patient is alert awake oriented and in no distress. ? ?Past Medical History:  ?Diagnosis Date  ? Angina pectoris ([place]) 01/10/2019  ? Asthma   ? Asthma, chronic obstructive, with acute exacerbation ([place])   ? [name] 01/13/2014  FEV1 76% predicted FVC 77% predicted FEV1 to FVC ratio 100% predicted FEF 25 7569% predicted CXR 01/13/14: NAD Alpha one antitrypsin 156  IgE 56 sl elevated.  RAST neg    ? Atherosclerotic heart disease of native coronary artery without angina pectoris   ? CAD (coronary artery disease) 09/23/2018  ? Chronic fatigue syndrome 12/22/2020  ? Chronic  maxillary sinusitis   ? Chronic tension-type headache, intractable 08/07/2019  ? Cigarette nicotine dependence with nicotine-induced disorder 08/18/2019  ? Continuous dependence on cigarette smoking 06/09/2019  ? COPD (chronic obstructive pulmonary disease) ([place])   ? COPD mixed type ([place]) 11/09/2020  ? COPD with acute exacerbation  ([place]) 11/10/2020  ? Coronary artery disease involving native coronary artery of native heart without angina pectoris 07/25/2018  ? Cough 10/29/2019  ? Depression   ? Dyslipidemia associated with type 2 diabetes mellitus ([place]) 08/23/2018  ? Essential hypertension 07/18/2018  ? Fibromyalgia   ? GERD (gastroesophageal reflux disease)   ? Headache   ? Hyperlipidemia   ? Hypertensive heart disease without heart failure   ? Hypothyroidism (acquired)   ? Loculated pleural effusion 12/30/2019  ? Major depressive disorder, single episode, mild ([place])   ? Menopausal and female climacteric states   ? Mixed hyperlipidemia   ? Nicotine dependence, cigarettes, with unspecified nicotine-induced disorders   ? NSTEMI (non-ST elevated myocardial infarction) ([place]) 07/12/2018  ? Numbness and tingling in left upper extremity   ? Obesity 12/22/2020  ? OSA (obstructive sleep apnea) 01/13/2014  ? Sleep study 02/19/14 : AHI 5.5.  Mild sleep apnea.  Lowest SpO2 81%   ? Overweight   ? Pain in right knee 07/04/2017  ? Palpitations 08/15/2019  ? Pleural effusion, not elsewhere classified 12/30/2019  ? Pneumonia due to COVID-19 virus 01/06/2020  ? Primary osteoarthritis   ? RA (rheumatoid arthritis) ([place])   ? [name]  ? Shortness of breath 10/29/2019  ? Skin cancer   ? face  ? Sleep apnea   ? cpap  ? TIA (transient ischemic attack) 09/14/2016  ? Type 2 diabetes mellitus ([place])   ? Type 2 diabetes mellitus with diabetic nephropathy ([place])   ? Vitamin D deficiency, unspecified   ? ? ?Past Surgical History:  ?Procedure Laterality Date  ? ABDOMINAL HYSTERECTOMY  1984  ? BREAST LUMPECTOMY  1976  ? left, benign  ? CHOLECYSTECTOMY  09/05/10  ? CORONARY STENT INTERVENTION N/A 07/12/2018  ? Procedure: CORONARY STENT INTERVENTION;  Surgeon: Jettie Booze, MD;  Location: [facility];  Service: Cardiovascular;  Laterality: N/A;  ? CORONARY THROMBECTOMY N/A 07/12/2018  ? Procedure: Coronary Thrombectomy;  Surgeon: Jettie Booze, MD;   Location: [facility];  Service: Cardiovascular;  Laterality: N/A;  ? INTRAVASCULAR PRESSURE WIRE/FFR STUDY N/A 01/21/2019  ? Procedure: INTRAVASCULAR PRESSURE WIRE/FFR STUDY;  Surgeon: Jettie Booze, MD;  Location: [facility];  Service: Cardiovascular;  Laterality: N/A;  ? LEFT HEART CATH AND CORONARY ANGIOGRAPHY N/A 07/12/2018  ? Procedure: LEFT HEART CATH AND CORONARY ANGIOGRAPHY;  Surgeon: Jettie Booze, MD;  Location: [facility];  Service: Cardiovascular;  Laterality: N/A;  ? LEFT HEART CATH AND CORONARY ANGIOGRAPHY N/A 01/21/2019  ? Procedure: LEFT HEART CATH AND CORONARY ANGIOGRAPHY;  Surgeon: Jettie Booze, MD;  Location: [facility];  Service: Cardiovascular;  Laterality: N/A;  ? ? ?Current Medications: ?Current Meds  ?Medication Sig  ? albuterol (PROVENTIL) (2.5 MG/3ML) 0.083% nebulizer solution Take 3 mLs (2.5 mg total) by nebulization every 6 (six) hours as needed for wheezing or shortness of breath.  ? albuterol (VENTOLIN HFA) 108 (90 Base) MCG/ACT inhaler Inhale 1-2 puffs into the lungs every 6 (six) hours as needed for wheezing or shortness of breath.  ? Budeson-Glycopyrrol-Formoterol (BREZTRI AEROSPHERE) 160-9-4.8 MCG/ACT AERO Inhale 2 puffs into the lungs 2 (two) times daily.  ? Dulaglutide (  TRULICITY) 1.5 PP/5.0DT SOPN Inject 1.5 mg into the skin once a week.  ? JARDIANCE 25 MG TABS tablet Take 25 mg by mouth daily.  ? leflunomide (ARAVA) 20 MG tablet Take 20 mg by mouth daily.  ? nitroGLYCERIN (NITROSTAT) 0.4 MG SL tablet Place 1 tablet (0.4 mg total) under the tongue every 5 (five) minutes x 3 doses as needed for chest pain.  ? nystatin (MYCOSTATIN/NYSTOP) powder Apply 1 application topically 2 (two) times daily.  ? rosuvastatin (CRESTOR) 20 MG tablet Take 20 mg by mouth at bedtime.  ? WEEKLY-D 1.25 MG (50000 UT) capsule Take 50,000 Units by mouth once a week.  ?  ? ?Allergies:   Shellfish allergy, Contrast media [iodinated contrast media], Metformin  and related, Iohexol, Amitriptyline hcl, Cymbalta [duloxetine hcl], Oxycontin [oxycodone hcl], and Ultram [tramadol]  ? ?Social History  ? ?Socioeconomic History  ? Marital status: Married  ?  Spouse name: N

## 2021-09-14 NOTE — Patient Instructions (Signed)
Medication Instructions:  ?Your physician has recommended you make the following change in your medication:  ? ?Take 81 mg coated aspirin daily. ?Use nitroglycerin as needed for chest pain. ? ?*If you need a refill on your cardiac medications before your next appointment, please call your pharmacy* ? ? ?Lab Work: ?None ordered ? ?If you have labs (blood work) drawn today and your tests are completely normal, you will receive your results only by: ?MyChart Message (if you have MyChart) OR ?A paper copy in the mail ?If you have any lab test that is abnormal or we need to change your treatment, we will call you to review the results. ? ? ?Testing/Procedures: ? ?[facility] ?[address] ?[place] [address] ?Dept: [phone] ?Loc: [phone] ? ?NANCIE BOCANEGRA  09/14/2021 ? ?You are scheduled for a Cardiac Catheterization on Friday, April 14 with Dr. Daneen Schick. ? ?1. Please arrive at the Main Entrance A at [facility]: [place], [place] [address] at 8:30 AM (This time is two hours before your procedure to ensure your preparation). Free valet parking service is available.  ? ?Special note: Every effort is made to have your procedure done on time. Please understand that emergencies sometimes delay scheduled procedures. ? ?2. Diet: Do not eat solid foods after midnight.  You may have clear liquids until 5 AM upon the day of the procedure. ? ?3. Labs: You do not need labs done. ? ?4. Medication instructions in preparation for your procedure: ? ? Contrast Allergy: Yes, Please take Prednisone '50mg'$  by mouth at: ?Thirteen hours prior to cath 9:00pm on Thursday ?Seven hours prior to cath 3:00am on Friday ?And prior to leaving home please take last dose of Prednisone '50mg'$  and Benadryl '50mg'$  by mouth. ? ? ?On the morning of your procedure, take Aspirin and any morning medicines NOT listed above.  You may use sips of  water. ? ?5. Plan to go home the same day, you will only stay overnight if medically necessary. ?6. You MUST have a responsible adult to drive you home. ?7. An adult MUST be with you the first 24 hours after you arrive home. ?8. Bring a current list of your medications, and the last time and date medication taken. ?9. Bring ID and current insurance cards. ?10.Please wear clothes that are easy to get on and off and wear slip-on shoes. ? ?Thank you for allowing [place] to care for you! ?  -- [facility] ? ? ? ?Follow-Up: ?At [facility], you and your health needs are our priority.  As part of our continuing mission to provide you with exceptional heart care, we have created designated Provider Care Teams.  These Care Teams include your primary Cardiologist (physician) and Advanced Practice Providers (APPs -  Physician Assistants and Nurse Practitioners) who all work together to provide you with the care you need, when you need it. ? ?We recommend signing up for the patient portal called "MyChart".  Sign up information is provided on this After Visit Summary.  MyChart is used to connect with patients for Virtual Visits (Telemedicine).  Patients are able to view lab/test results, encounter notes, upcoming appointments, etc.  Non-urgent messages can be sent to your provider as well.   ?To learn more about what you can do with MyChart, go to [email or web address].   ? ?Your next appointment:   ?As directed ? ?The format for your next appointment:   ?  In Person ? ?Provider:   ?Jyl Heinz, MD ? ? ?Other Instructions ? ?Coronary Angiogram With Stent ?Coronary angiogram with stent placement is a procedure to widen or open a narrow blood vessel of the heart (coronary artery). Arteries may become blocked by cholesterol buildup (plaques) in the lining of the artery wall. When a coronary artery becomes partially blocked, blood flow to that area decreases. This may lead to chest pain or a heart  attack (myocardial infarction). ?A stent is a small piece of metal that looks like mesh or spring. Stent placement may be done as treatment after a heart attack, or to prevent a heart attack if a blocked artery is found by a coronary angiogram. ?Let your health care provider know about: ?Any allergies you have, including allergies to medicines or contrast dye. ?All medicines you are taking, including vitamins, herbs, eye drops, creams, and over-the-counter medicines. ?Any problems you or family members have had with anesthetic medicines. ?Any blood disorders you have. ?Any surgeries you have had. ?Any medical conditions you have, including kidney problems or kidney failure. ?Whether you are pregnant or may be pregnant. ?Whether you are breastfeeding. ?What are the risks? ?Generally, this is a safe procedure. However, serious problems may occur, including: ?Damage to nearby structures or organs, such as the heart, blood vessels, or kidneys. ?A return of blockage. ?Bleeding, infection, or bruising at the insertion site. ?A collection of blood under the skin (hematoma) at the insertion site. ?A blood clot in another part of the body. ?Allergic reaction to medicines or dyes. ?Bleeding into the abdomen (retroperitoneal bleeding). ?Stroke (rare). ?Heart attack (rare). ?What happens before the procedure? ?Staying hydrated ?Follow instructions from your health care provider about hydration, which may include: ?Up to 2 hours before the procedure - you may continue to drink clear liquids, such as water, clear fruit juice, black coffee, and plain tea.  ?  ?Eating and drinking restrictions ?Follow instructions from your health care provider about eating and drinking, which may include: ?8 hours before the procedure - stop eating heavy meals or foods, such as meat, fried foods, or fatty foods. ?6 hours before the procedure - stop eating light meals or foods, such as toast or cereal. ?2 hours before the procedure - stop drinking  clear liquids. ?Medicines ?Ask your health care provider about: ?Changing or stopping your regular medicines. This is especially important if you are taking diabetes medicines or blood thinners. ?Taking medicines such as aspirin and ibuprofen. These medicines can thin your blood. Do not take these medicines unless your health care provider tells you to take them. ?Generally, aspirin is recommended before a thin tube, called a catheter, is passed through a blood vessel and inserted into the heart (cardiac catheterization). ?Taking over-the-counter medicines, vitamins, herbs, and supplements. ?General instructions ?Do not use any products that contain nicotine or tobacco for at least 4 weeks before the procedure. These products include cigarettes, e-cigarettes, and chewing tobacco. If you need help quitting, ask your health care provider. ?Plan to have someone take you home from the hospital or clinic. ?If you will be going home right after the procedure, plan to have someone with you for 24 hours. ?You may have tests and imaging procedures. ?Ask your health care provider: ?How your insertion site will be marked. Ask which artery will be used for the procedure. ?What steps will be taken to help prevent infection. These may include: ?Removing hair at the insertion site. ?Washing skin with a germ-killing soap. ?Taking antibiotic medicine. ?  What happens during the procedure? ?An IV will be inserted into one of your veins. ?Electrodes may be placed on your chest to monitor your heart rate during the procedure. ?You will be given one or more of the following: ?A medicine to help you relax (sedative). ?A medicine to numb the area (local anesthetic) for catheter insertion. ?A small incision will be made for catheter insertion. ?The catheter will be inserted into an artery using a guide wire. The location may be in your groin, your wrist, or the fold of your arm (near your elbow). ?An X-ray procedure (fluoroscopy) will be  used to help guide the catheter to the opening of the heart arteries. ?A dye will be injected into the catheter. X-rays will be taken. The dye helps to show where any narrowing or blockages are located in the ar

## 2021-09-16 ENCOUNTER — Encounter (HOSPITAL_COMMUNITY)
Admission: RE | Disposition: A | Discharge status: Home / Self Care | Payer: Self-pay | Source: Home / Self Care | Attending: Interventional Cardiology

## 2021-09-16 ENCOUNTER — Ambulatory Visit (HOSPITAL_COMMUNITY)
Admission: RE | Admit: 2021-09-16 | Discharge: 2021-09-16 | Disposition: A | Discharge status: Home / Self Care | Payer: PPO | Attending: Interventional Cardiology | Admitting: Interventional Cardiology

## 2021-09-16 DIAGNOSIS — I214 Non-ST elevation (NSTEMI) myocardial infarction: Secondary | ICD-10-CM | POA: Diagnosis present

## 2021-09-16 DIAGNOSIS — I252 Old myocardial infarction: Secondary | ICD-10-CM | POA: Diagnosis not present

## 2021-09-16 DIAGNOSIS — E1121 Type 2 diabetes mellitus with diabetic nephropathy: Secondary | ICD-10-CM | POA: Insufficient documentation

## 2021-09-16 DIAGNOSIS — J449 Chronic obstructive pulmonary disease, unspecified: Secondary | ICD-10-CM | POA: Diagnosis present

## 2021-09-16 DIAGNOSIS — Z955 Presence of coronary angioplasty implant and graft: Secondary | ICD-10-CM | POA: Insufficient documentation

## 2021-09-16 DIAGNOSIS — E782 Mixed hyperlipidemia: Secondary | ICD-10-CM | POA: Diagnosis not present

## 2021-09-16 DIAGNOSIS — I25119 Atherosclerotic heart disease of native coronary artery with unspecified angina pectoris: Secondary | ICD-10-CM | POA: Insufficient documentation

## 2021-09-16 DIAGNOSIS — I10 Essential (primary) hypertension: Secondary | ICD-10-CM | POA: Diagnosis not present

## 2021-09-16 DIAGNOSIS — G4733 Obstructive sleep apnea (adult) (pediatric): Secondary | ICD-10-CM | POA: Diagnosis present

## 2021-09-16 DIAGNOSIS — I209 Angina pectoris, unspecified: Secondary | ICD-10-CM

## 2021-09-16 DIAGNOSIS — Z7984 Long term (current) use of oral hypoglycemic drugs: Secondary | ICD-10-CM | POA: Diagnosis not present

## 2021-09-16 DIAGNOSIS — I2510 Atherosclerotic heart disease of native coronary artery without angina pectoris: Secondary | ICD-10-CM

## 2021-09-16 DIAGNOSIS — Z87891 Personal history of nicotine dependence: Secondary | ICD-10-CM | POA: Diagnosis not present

## 2021-09-16 DIAGNOSIS — F17219 Nicotine dependence, cigarettes, with unspecified nicotine-induced disorders: Secondary | ICD-10-CM | POA: Diagnosis present

## 2021-09-16 HISTORY — PX: INTRAVASCULAR PRESSURE WIRE/FFR STUDY: CATH118243

## 2021-09-16 HISTORY — PX: LEFT HEART CATH AND CORONARY ANGIOGRAPHY: CATH118249

## 2021-09-16 LAB — BASIC METABOLIC PANEL
Anion gap: 9 (ref 5–15)
BUN: 8 mg/dL (ref 8–23)
CO2: 23 mmol/L (ref 22–32)
Calcium: 9.4 mg/dL (ref 8.9–10.3)
Chloride: 103 mmol/L (ref 98–111)
Creatinine, Ser: 0.65 mg/dL (ref 0.44–1.00)
GFR, Estimated: >60 mL/min (ref >60)
Glucose, Bld: 273 mg/dL — ABNORMAL HIGH (ref 70–99)
Potassium: 3.8 mmol/L (ref 3.5–5.1)
Sodium: 135 mmol/L (ref 135–145)

## 2021-09-16 LAB — GLUCOSE, CAPILLARY: Glucose-Capillary: 268 mg/dL — ABNORMAL HIGH (ref 70–99)

## 2021-09-16 LAB — POCT ACTIVATED CLOTTING TIME
Activated Clotting Time: 233 seconds
Activated Clotting Time: 239 seconds

## 2021-09-16 SURGERY — LEFT HEART CATH AND CORONARY ANGIOGRAPHY
Anesthesia: LOCAL

## 2021-09-16 MED ORDER — MIDAZOLAM HCL 2 MG/2ML IJ SOLN
INTRAMUSCULAR | Status: AC
  Filled 2021-09-16: qty 2

## 2021-09-16 MED ORDER — SODIUM CHLORIDE 0.9 % IV SOLN: INTRAVENOUS | Status: DC

## 2021-09-16 MED ORDER — HEPARIN (PORCINE) IN NACL 2-0.9 UNITS/ML
INTRAMUSCULAR | Status: DC | PRN
  Administered 2021-09-16: 10 mL via INTRA_ARTERIAL

## 2021-09-16 MED ORDER — VERAPAMIL HCL 2.5 MG/ML IV SOLN
INTRAVENOUS | Status: AC
  Filled 2021-09-16: qty 2

## 2021-09-16 MED ORDER — HEPARIN SODIUM (PORCINE) 1000 UNIT/ML IJ SOLN
INTRAMUSCULAR | Status: AC
  Filled 2021-09-16: qty 10

## 2021-09-16 MED ORDER — HEPARIN SODIUM (PORCINE) 1000 UNIT/ML IJ SOLN
INTRAMUSCULAR | Status: DC | PRN
  Administered 2021-09-16: 3000 [IU] via INTRAVENOUS
  Administered 2021-09-16: 4000 [IU] via INTRAVENOUS
  Administered 2021-09-16: 1500 [IU] via INTRAVENOUS

## 2021-09-16 MED ORDER — LIDOCAINE HCL (PF) 1 % IJ SOLN
INTRAMUSCULAR | Status: AC
  Filled 2021-09-16: qty 30

## 2021-09-16 MED ORDER — SODIUM CHLORIDE 0.9 % IV SOLN: 250.0000 mL | INTRAVENOUS | Status: DC | PRN

## 2021-09-16 MED ORDER — ASPIRIN 81 MG PO CHEW: 81.0000 mg | CHEWABLE_TABLET | Freq: Every day | ORAL | Status: DC

## 2021-09-16 MED ORDER — FENTANYL CITRATE (PF) 100 MCG/2ML IJ SOLN
INTRAMUSCULAR | Status: AC
Start: 2021-09-16 — End: ?
  Filled 2021-09-16: qty 2

## 2021-09-16 MED ORDER — ADENOSINE (DIAGNOSTIC) 140MCG/KG/MIN
INTRAVENOUS | Status: DC | PRN
  Administered 2021-09-16: 140 ug/kg/min via INTRAVENOUS

## 2021-09-16 MED ORDER — ACETAMINOPHEN 325 MG PO TABS: 650.0000 mg | ORAL_TABLET | ORAL | Status: DC | PRN

## 2021-09-16 MED ORDER — HEPARIN SODIUM (PORCINE) 1000 UNIT/ML IJ SOLN
INTRAMUSCULAR | Status: AC
Start: 2021-09-16 — End: ?
  Filled 2021-09-16: qty 10

## 2021-09-16 MED ORDER — HEPARIN (PORCINE) IN NACL 1000-0.9 UT/500ML-% IV SOLN
INTRAVENOUS | Status: AC
  Filled 2021-09-16: qty 1000

## 2021-09-16 MED ORDER — HYDRALAZINE HCL 20 MG/ML IJ SOLN: 10.0000 mg | INTRAMUSCULAR | Status: DC | PRN

## 2021-09-16 MED ORDER — NITROGLYCERIN 0.4 MG SL SUBL: 0.4000 mg | SUBLINGUAL_TABLET | SUBLINGUAL | 3 refills | Status: DC | PRN

## 2021-09-16 MED ORDER — SODIUM CHLORIDE 0.9% FLUSH: 3.0000 mL | INTRAVENOUS | Status: DC | PRN

## 2021-09-16 MED ORDER — SODIUM CHLORIDE 0.9 % WEIGHT BASED INFUSION
3.0000 mL/kg/h | INTRAVENOUS | Status: AC
  Administered 2021-09-16: 3 mL/kg/h via INTRAVENOUS

## 2021-09-16 MED ORDER — FENTANYL CITRATE (PF) 100 MCG/2ML IJ SOLN
INTRAMUSCULAR | Status: DC | PRN
  Administered 2021-09-16 (×2): 25 ug via INTRAVENOUS

## 2021-09-16 MED ORDER — SODIUM CHLORIDE 0.9% FLUSH: 3.0000 mL | Freq: Two times a day (BID) | INTRAVENOUS | Status: DC

## 2021-09-16 MED ORDER — LABETALOL HCL 5 MG/ML IV SOLN: 10.0000 mg | INTRAVENOUS | Status: DC | PRN

## 2021-09-16 MED ORDER — LIDOCAINE HCL (PF) 1 % IJ SOLN
INTRAMUSCULAR | Status: DC | PRN
  Administered 2021-09-16: 2 mL

## 2021-09-16 MED ORDER — HEPARIN (PORCINE) IN NACL 1000-0.9 UT/500ML-% IV SOLN
INTRAVENOUS | Status: DC | PRN
Start: 2021-09-16 — End: 2021-09-16
  Administered 2021-09-16 (×2): 500 mL

## 2021-09-16 MED ORDER — MIDAZOLAM HCL 2 MG/2ML IJ SOLN
INTRAMUSCULAR | Status: DC | PRN
Start: 2021-09-16 — End: 2021-09-16
  Administered 2021-09-16: .5 mg via INTRAVENOUS
  Administered 2021-09-16: 1 mg via INTRAVENOUS

## 2021-09-16 MED ORDER — ASPIRIN EC 81 MG PO TBEC: 81.0000 mg | DELAYED_RELEASE_TABLET | Freq: Every day | ORAL | 2 refills | Status: DC

## 2021-09-16 MED ORDER — ASPIRIN 81 MG PO CHEW: 81.0000 mg | CHEWABLE_TABLET | ORAL | Status: DC

## 2021-09-16 MED ORDER — ONDANSETRON HCL 4 MG/2ML IJ SOLN: 4.0000 mg | Freq: Four times a day (QID) | INTRAMUSCULAR | Status: DC | PRN

## 2021-09-16 MED ORDER — ADENOSINE 12 MG/4ML IV SOLN
INTRAVENOUS | Status: AC
  Filled 2021-09-16: qty 16

## 2021-09-16 MED ORDER — SODIUM CHLORIDE 0.9 % WEIGHT BASED INFUSION: 1.0000 mL/kg/h | INTRAVENOUS | Status: DC

## 2021-09-16 SURGICAL SUPPLY — 14 items
CATH 5FR JL3.5 JR4 ANG PIG MP (CATHETERS) ×1 IMPLANT
CATH VISTA GUIDE 6FR XBLAD3.0 (CATHETERS) ×2 IMPLANT
CATH VISTA GUIDE 6FR XBLAD3.5 (CATHETERS) ×1 IMPLANT
DEVICE RAD COMP TR BAND LRG (VASCULAR PRODUCTS) ×1 IMPLANT
GLIDESHEATH SLEND A-KIT 6F 22G (SHEATH) ×1 IMPLANT
GUIDEWIRE INQWIRE 1.5J.035X260 (WIRE) IMPLANT
GUIDEWIRE PRESSURE X 175 (WIRE) ×1 IMPLANT
INQWIRE 1.5J .035X260CM (WIRE) ×2
KIT HEART LEFT (KITS) ×2 IMPLANT
PACK CARDIAC CATHETERIZATION (CUSTOM PROCEDURE TRAY) ×2 IMPLANT
SHEATH PROBE COVER 6X72 (BAG) ×2 IMPLANT
TRANSDUCER W/STOPCOCK (MISCELLANEOUS) ×2 IMPLANT
TUBING CIL FLEX 10 FLL-RA (TUBING) ×2 IMPLANT
WIRE HI TORQ VERSACORE-J 145CM (WIRE) ×1 IMPLANT

## 2021-09-16 NOTE — Interval H&P Note (Signed)
Cath Lab Visit (complete for each Cath Lab visit) ? ?Clinical Evaluation Leading to the Procedure:  ? ?ACS: No. ? ?Non-ACS:   ? ?Anginal Classification: CCS II ? ?Anti-ischemic medical therapy: Minimal Therapy (1 class of medications) ? ?Non-Invasive Test Results: High-risk stress test findings: cardiac mortality >3%/year ? ?Prior CABG: No previous CABG ? ? ? ? ? ?History and Physical Interval Note: ? ?09/16/2021 ?12:02 PM ? ?Brenda Cochran  has presented today for surgery, with the diagnosis of abnormal ct.  The various methods of treatment have been discussed with the patient and family. After consideration of risks, benefits and other options for treatment, the patient has consented to  Procedure(s): ?LEFT HEART CATH AND CORONARY ANGIOGRAPHY (N/A) as a surgical intervention.  The patient's history has been reviewed, patient examined, no change in status, stable for surgery.  I have reviewed the patient's chart and labs.  Questions were answered to the patient's satisfaction.   ? ? ?Belva Crome III ? ? ?

## 2021-09-18 ENCOUNTER — Telehealth: Payer: Self-pay | Admitting: Student

## 2021-09-18 NOTE — Telephone Encounter (Signed)
? ?  Patient called Answering Service with concerns for numbness/tingling.  Called and spoke with patient.  She had an outpatient left heart catheterization on 09/16/2021 via right radial artery.  No PCI performed. She states yesterday she started having some very mild numbness/tingling in her right fingers. She states the radial cath site looks good.  Not painful.  She denies any skin discoloration.  She thinks her right hand may be slightly cooler than the left.  Today, she also started to have some numbness/tingling around her mouth and nose.  She denies any other stroke like symptoms.  She does have a history of Bell's palsy.  Discussed that unfortunately I am unstable to say 100% over the phone what is causing her symptoms.  I think it is likely okay to continue to monitor her symptoms in her right hand for now given no skin discoloration and stable cath site by description.  However, advised that if she has progression in numbness/tingling/coolness or develops skin discoloration, then she needs to come in and be seen.  The heart cath should not be causing numbness/tingling around mouth/nose.  Discussed potential causes of this including stroke/TIA. Explained that I am unable to say for certain over the phone. Again she has no other stroke like symptoms.  Advised patient to go to the ED if she is concerned for stroke.  She states she will continue to monitor for now and will going to ED to be evaluated if she has progression of symptoms.  ? ?Darreld Mclean, PA-C ?09/18/2021 11:18 AM ? ? ?

## 2021-09-19 ENCOUNTER — Other Ambulatory Visit (HOSPITAL_COMMUNITY): Payer: Self-pay | Admitting: Cardiology

## 2021-09-19 ENCOUNTER — Other Ambulatory Visit: Payer: Self-pay | Admitting: Cardiology

## 2021-09-19 ENCOUNTER — Telehealth (HOSPITAL_COMMUNITY): Payer: Self-pay | Admitting: Emergency Medicine

## 2021-09-19 ENCOUNTER — Encounter (HOSPITAL_COMMUNITY): Payer: Self-pay | Admitting: Interventional Cardiology

## 2021-09-19 DIAGNOSIS — I25119 Atherosclerotic heart disease of native coronary artery with unspecified angina pectoris: Secondary | ICD-10-CM

## 2021-09-19 NOTE — Telephone Encounter (Signed)
Phone call to patient to inform her that Dr. Geraldo Pitter and Dr. Tamala Julian discussed her getting a cardiac PET scan for perfusion and bloodflow assessment. There is question of whether the progression of her LAD disease is now causing ischemia and if she would benefit from bypass surgery. This test would definitively address their concern.  ? ?I left a voicemail for callback. ? ?Marchia Bond RN Navigator Cardiac Imaging ?[facility] ?[phone] Office  ?[phone] Cell ? ?

## 2021-09-20 ENCOUNTER — Encounter (HOSPITAL_COMMUNITY): Payer: Self-pay

## 2021-09-20 NOTE — Telephone Encounter (Signed)
Spoke with [place] per DPR and she states that someone called this am and scheduled her for a PET scan this am. ?

## 2021-09-22 ENCOUNTER — Ambulatory Visit: Payer: PPO | Admitting: Cardiology

## 2021-10-03 ENCOUNTER — Telehealth (HOSPITAL_COMMUNITY): Payer: Self-pay | Admitting: *Deleted

## 2021-10-03 NOTE — Telephone Encounter (Signed)
Reaching out to patient to offer assistance regarding upcoming cardiac imaging study; pt verbalizes understanding of appt date/time, parking situation and where to check in, pre-test NPO status, and verified current allergies; name and call back number provided for further questions should they arise ? ?Gordy Clement RN Navigator Cardiac Imaging ?[facility] ?[phone] office ?[phone] cell ? ?Patient aware not to consume caffeine 12 hours prior to her cardiac PET. ?

## 2021-10-04 ENCOUNTER — Encounter (HOSPITAL_COMMUNITY)
Admission: RE | Admit: 2021-10-04 | Discharge: 2021-10-04 | Disposition: A | Discharge status: Home / Self Care | Payer: PPO | Source: Ambulatory Visit | Attending: Cardiology | Admitting: Cardiology

## 2021-10-04 DIAGNOSIS — I25119 Atherosclerotic heart disease of native coronary artery with unspecified angina pectoris: Secondary | ICD-10-CM | POA: Diagnosis not present

## 2021-10-04 LAB — NM PET CT CARDIAC PERFUSION MULTI W/ABSOLUTE BLOODFLOW
Base ST Depression (mm): 0 mm
LV dias vol: 72 mL (ref 46–106)
MBFR: 2.38
Nuc Rest EF: 53 %
Nuc Stress EF: 65 %
Peak HR: 106 {beats}/min
Rest HR: 72 {beats}/min
Rest MBF: 0.77 ml/g/min
Rest Nuclear Isotope Dose: 21 mCi
Rest perfusion cavity size (mL): 75 mL
ST Depression (mm): 0 mm
Stress MBF: 1.83 ml/g/min
Stress Nuclear Isotope Dose: 21 mCi
Stress perfusion cavity size (mL): 66 mL
TID: 1.18

## 2021-10-04 MED ORDER — RUBIDIUM RB82 GENERATOR (RUBYFILL)
21.0600 | PACK | Freq: Once | INTRAVENOUS | Status: AC
  Administered 2021-10-04: 21.06 via INTRAVENOUS

## 2021-10-04 MED ORDER — REGADENOSON 0.4 MG/5ML IV SOLN
0.4000 mg | Freq: Once | INTRAVENOUS | Status: AC
Start: 2021-10-04 — End: 2021-10-04
  Administered 2021-10-04: 0.4 mg via INTRAVENOUS

## 2021-10-04 MED ORDER — RUBIDIUM RB82 GENERATOR (RUBYFILL)
21.0400 | PACK | Freq: Once | INTRAVENOUS | Status: AC
  Administered 2021-10-04: 21.04 via INTRAVENOUS

## 2021-10-19 ENCOUNTER — Ambulatory Visit: Payer: PPO | Admitting: Gastroenterology

## 2022-01-18 ENCOUNTER — Telehealth: Payer: Self-pay

## 2022-01-18 NOTE — Telephone Encounter (Signed)
   Pre-operative Risk Assessment    Patient Name: Brenda Cochran  DOB: 1951/03/08 MRN: [id number]      Request for Surgical Clearance    Procedure:   right total knee arthroplasty  Date of Surgery:  Clearance TBD                                 Surgeon:  Dr. Rod Can Surgeon's Group or Practice Name:  [facility] Phone number:  [phone] Fax number:  [phone]   Type of Clearance Requested:   - Medical    Type of Anesthesia:  Spinal   Additional requests/questions:    SignedLowella Grip   01/18/2022, 10:23 AM

## 2022-01-18 NOTE — Telephone Encounter (Signed)
   Name: Brenda Cochran  DOB: [birth year]  MRN: [id number]  Primary Cardiologist: None  Chart reviewed as part of pre-operative protocol coverage. Because of [name]'s past medical history and time since last visit, she will require a follow-up in-office visit in order to better assess preoperative cardiovascular risk.  Pre-op covering staff: - Please schedule appointment and call patient to inform them. If patient already had an upcoming appointment within acceptable timeframe, please add "pre-op clearance" to the appointment notes so provider is aware. - Please contact requesting surgeon's office via preferred method (i.e, phone, fax) to inform them of need for appointment prior to surgery.  [name], NP  01/18/2022, 3:43 PM

## 2022-01-19 NOTE — Telephone Encounter (Signed)
Pt has been scheduled to see Dr. Geraldo Pitter, 01/31/22 and clearance will be addressed at that time.  Will route to the requesting surgeon's office to make them aware.

## 2022-01-30 ENCOUNTER — Other Ambulatory Visit: Payer: Self-pay

## 2022-01-30 DIAGNOSIS — Z8371 Family history of colonic polyps: Secondary | ICD-10-CM | POA: Insufficient documentation

## 2022-01-30 DIAGNOSIS — Z86010 Personal history of colonic polyps: Secondary | ICD-10-CM | POA: Insufficient documentation

## 2022-01-30 DIAGNOSIS — F419 Anxiety disorder, unspecified: Secondary | ICD-10-CM | POA: Insufficient documentation

## 2022-01-31 ENCOUNTER — Encounter: Payer: Self-pay | Admitting: Cardiology

## 2022-01-31 ENCOUNTER — Ambulatory Visit: Payer: PPO | Attending: Cardiology | Admitting: Cardiology

## 2022-01-31 VITALS — BP 146/76 | HR 87 | Ht 65.0 in | Wt 176.0 lb

## 2022-01-31 DIAGNOSIS — I2510 Atherosclerotic heart disease of native coronary artery without angina pectoris: Secondary | ICD-10-CM

## 2022-01-31 DIAGNOSIS — F17219 Nicotine dependence, cigarettes, with unspecified nicotine-induced disorders: Secondary | ICD-10-CM | POA: Diagnosis not present

## 2022-01-31 DIAGNOSIS — E1121 Type 2 diabetes mellitus with diabetic nephropathy: Secondary | ICD-10-CM | POA: Diagnosis not present

## 2022-01-31 DIAGNOSIS — E782 Mixed hyperlipidemia: Secondary | ICD-10-CM

## 2022-01-31 DIAGNOSIS — Z01810 Encounter for preprocedural cardiovascular examination: Secondary | ICD-10-CM

## 2022-01-31 DIAGNOSIS — I10 Essential (primary) hypertension: Secondary | ICD-10-CM

## 2022-01-31 NOTE — Patient Instructions (Addendum)

## 2022-01-31 NOTE — Progress Notes (Signed)
Cardiology Office Note:    Date:  01/31/2022   ID:  Brenda Cochran, DOB 1950/06/27, MRN [id number]  PCP:  Rhea Bleacher, NP  Cardiologist:  Jenean Lindau, MD   Referring MD: Rhea Bleacher, NP    ASSESSMENT:    1. Atherosclerosis of native coronary artery of native heart without angina pectoris   2. Essential hypertension   3. Mixed hyperlipidemia   4. Type 2 diabetes mellitus with diabetic nephropathy, without long-term current use of insulin ([place])   5. Preoperative cardiovascular examination   6. Nicotine dependence, cigarettes, with unspecified nicotine-induced disorders    PLAN:    In order of problems listed above:  Coronary artery disease: Secondary prevention stressed with patient.  Importance of compliance with diet and medication stressed and she vocalized understanding. Preoperative cardiovascular risk assessment: In view of the below mentioned evaluation which includes coronary angiography and PET stress test results which did not reveal any evidence of ischemia, she is not at high risk for coronary events during the aforementioned surgery.  She remains at moderate risk because of comorbidities and her existing coronary artery disease.  I discussed this with her at length.  Meticulous hemodynamic monitoring will further reduce the risk of coronary events. Essential hypertension: Blood pressure stable and diet was emphasized. Mixed dyslipidemia: On lipid-lowering medications managed by primary care.  There have added Zetia to her regimen.  I counseled her about diet.  She is not very compliant with diet instructions.  Risks explained. Cigarette smoking: I spent 5 minutes with the patient discussing solely about smoking. Smoking cessation was counseled. I suggested to the patient also different medications and pharmacological interventions. Patient is keen to try stopping on its own at this time. He will get back to me if he needs any further assistance in this  matter. Patient will be seen in follow-up appointment in 6 months or earlier if the patient has any concerns    Medication Adjustments/Labs and Tests Ordered: Current medicines are reviewed at length with the patient today.  Concerns regarding medicines are outlined above.  Orders Placed This Encounter  Procedures   EKG 12-Lead   No orders of the defined types were placed in this encounter.    No chief complaint on file.    History of Present Illness:    Brenda Cochran is a 71 y.o. female.  Patient has past medical history of obstructive coronary artery disease, essential hypertension, dyslipidemia, COPD, diabetes mellitus and cigarette smoking.  Unfortunately she continues to smoke.  She underwent coronary angiography evaluation and there were questions about the possibility and the need for intervention.  For this she underwent a PET stress and this did not reveal any evidence of ischemia.  Patient denies any problems at this time and takes care of activities of daily living.  She is not much ambulatory because of knee issues.  No chest pain orthopnea or PND.  At the time of my evaluation, the patient is alert awake oriented and in no distress.  Past Medical History:  Diagnosis Date   Angina pectoris ([place]) 01/10/2019   Anxiety    Asthma    Asthma, chronic obstructive, with acute exacerbation ([place])    Brenda Cochran 01/13/2014  FEV1 76% predicted FVC 77% predicted FEV1 to FVC ratio 100% predicted FEF 25 7569% predicted CXR 01/13/14: NAD Alpha one antitrypsin 156  IgE 56 sl elevated.  RAST neg     Atherosclerotic heart disease of native coronary artery without angina pectoris  CAD (coronary artery disease) 09/23/2018   Chronic fatigue syndrome 12/22/2020   Chronic maxillary sinusitis    Chronic tension-type headache, intractable 08/07/2019   Cigarette nicotine dependence with nicotine-induced disorder 08/18/2019   Continuous dependence on cigarette smoking 06/09/2019   COPD (chronic  obstructive pulmonary disease) (HCC)    COPD mixed type ([place]) 11/09/2020   COPD with acute exacerbation ([place]) 11/10/2020   Coronary artery disease involving native coronary artery of native heart without angina pectoris 07/25/2018   Cough 10/29/2019   Depression    Dyslipidemia associated with type 2 diabetes mellitus ([place]) 08/23/2018   Essential hypertension 07/18/2018   Family history of colonic polyps    Fibromyalgia    GERD (gastroesophageal reflux disease)    Headache    History of colon polyps    Hyperlipidemia    Hypertensive heart disease without heart failure    Hypothyroidism (acquired)    Loculated pleural effusion 12/30/2019   Major depressive disorder, single episode, mild ([place])    Menopausal and female climacteric states    Mixed hyperlipidemia    Nicotine dependence, cigarettes, with unspecified nicotine-induced disorders    NSTEMI (non-ST elevated myocardial infarction) ([place]) 07/12/2018   Numbness and tingling in left upper extremity    Obesity 12/22/2020   OSA (obstructive sleep apnea) 01/13/2014   Sleep study 02/19/14 : AHI 5.5.  Mild sleep apnea.  Lowest SpO2 81%    Overweight    Pain in right knee 07/04/2017   Palpitations 08/15/2019   Pleural effusion, not elsewhere classified 12/30/2019   Pneumonia due to COVID-19 virus 01/06/2020   Preoperative cardiovascular examination 08/30/2021   Primary osteoarthritis    RA (rheumatoid arthritis) ([place])    Brenda Cochran   Shortness of breath 10/29/2019   Skin cancer    face   Sleep apnea    cpap   TIA (transient ischemic attack) 09/14/2016   Type 2 diabetes mellitus ([place])    Type 2 diabetes mellitus with diabetic nephropathy ([place])    Vitamin D deficiency, unspecified     Past Surgical History:  Procedure Laterality Date   ABDOMINAL HYSTERECTOMY  06/05/1982   BREAST LUMPECTOMY  06/05/1974   left, benign   CHOLECYSTECTOMY  09/05/2010   COLONOSCOPY  02/07/2013   Colon polyp-status post polypectomy. Small  internal hemorrhoids. Otherwise grossly normal colonoscopy   CORONARY STENT INTERVENTION N/A 07/12/2018   Procedure: CORONARY STENT INTERVENTION;  Surgeon: Jettie Booze, MD;  Location: [facility];  Service: Cardiovascular;  Laterality: N/A;   CORONARY THROMBECTOMY N/A 07/12/2018   Procedure: Coronary Thrombectomy;  Surgeon: Jettie Booze, MD;  Location: [facility];  Service: Cardiovascular;  Laterality: N/A;   ESOPHAGOGASTRODUODENOSCOPY  03/26/2017   Moderate gastritis. Otherwise normal EGD   HEEL SPUR SURGERY     foot for spurs   INTRAVASCULAR PRESSURE WIRE/FFR STUDY N/A 01/21/2019   Procedure: INTRAVASCULAR PRESSURE WIRE/FFR STUDY;  Surgeon: Jettie Booze, MD;  Location: [facility];  Service: Cardiovascular;  Laterality: N/A;   INTRAVASCULAR PRESSURE WIRE/FFR STUDY N/A 09/16/2021   Procedure: INTRAVASCULAR PRESSURE WIRE/FFR STUDY;  Surgeon: Belva Crome, MD;  Location: [facility];  Service: Cardiovascular;  Laterality: N/A;   LEFT HEART CATH AND CORONARY ANGIOGRAPHY N/A 07/12/2018   Procedure: LEFT HEART CATH AND CORONARY ANGIOGRAPHY;  Surgeon: Jettie Booze, MD;  Location: [facility];  Service: Cardiovascular;  Laterality: N/A;   LEFT HEART CATH AND CORONARY ANGIOGRAPHY N/A 01/21/2019   Procedure: LEFT HEART  CATH AND CORONARY ANGIOGRAPHY;  Surgeon: Jettie Booze, MD;  Location: [facility];  Service: Cardiovascular;  Laterality: N/A;   LEFT HEART CATH AND CORONARY ANGIOGRAPHY N/A 09/16/2021   Procedure: LEFT HEART CATH AND CORONARY ANGIOGRAPHY;  Surgeon: Belva Crome, MD;  Location: [facility];  Service: Cardiovascular;  Laterality: N/A;   TONSILLECTOMY      Current Medications: Current Meds  Medication Sig   albuterol (PROVENTIL) (2.5 MG/3ML) 0.083% nebulizer solution Take 3 mLs (2.5 mg total) by nebulization every 6 (six) hours as needed for wheezing or shortness of breath.   albuterol  (VENTOLIN HFA) 108 (90 Base) MCG/ACT inhaler Inhale 1-2 puffs into the lungs every 6 (six) hours as needed for wheezing or shortness of breath.   aspirin EC 81 MG tablet Take 1 tablet (81 mg total) by mouth daily. Swallow whole.   Budeson-Glycopyrrol-Formoterol (BREZTRI AEROSPHERE) 160-9-4.8 MCG/ACT AERO Inhale 2 puffs into the lungs 2 (two) times daily.   cyanocobalamin (,VITAMIN B-12,) 1000 MCG/ML injection Inject 1,000 mcg into the muscle every 30 (thirty) days.   ezetimibe (ZETIA) 10 MG tablet Take 10 mg by mouth at bedtime.   fluticasone (FLONASE) 50 MCG/ACT nasal spray Place 1 spray into both nostrils daily as needed for allergies or rhinitis.   JARDIANCE 25 MG TABS tablet Take 25 mg by mouth daily.   leflunomide (ARAVA) 20 MG tablet Take 20 mg by mouth daily.   nitroGLYCERIN (NITROSTAT) 0.4 MG SL tablet Place 1 tablet (0.4 mg total) under the tongue every 5 (five) minutes x 3 doses as needed for chest pain.   nystatin (MYCOSTATIN/NYSTOP) powder Apply 1 application. topically 2 (two) times daily as needed (irritation).   rosuvastatin (CRESTOR) 40 MG tablet Take 40 mg by mouth daily.   TRULICITY 3 XL/2.4MW SOPN Inject 3 mg into the skin once a week.     Allergies:   Shellfish allergy, Contrast media [iodinated contrast media], Metformin and related, Duloxetine, Oxycodone, Amitriptyline hcl, Cymbalta [duloxetine hcl], Iohexol, Oxycontin [oxycodone hcl], and Ultram [tramadol]   Social History   Socioeconomic History   Marital status: Married    Spouse name: Not on file   Number of children: 6   Years of education: Not on file   Highest education level: Not on file  Occupational History   Occupation: disabled    Comment: jockey  Tobacco Use   Smoking status: Former    Packs/day: 4.00    Years: 30.00    Total pack years: 120.00    Types: Cigarettes   Smokeless tobacco: Never   Tobacco comments:    Quit 2 weeks ago, 11/15/2020  Vaping Use   Vaping Use: Never used  Substance and  Sexual Activity   Alcohol use: No   Drug use: No   Sexual activity: Not on file  Other Topics Concern   Not on file  Social History Narrative   Lives with Riki Berninger husband.   Disabled   Social Determinants of Health   Financial Resource Strain: Not on file  Food Insecurity: Not on file  Transportation Needs: Not on file  Physical Activity: Not on file  Stress: Not on file  Social Connections: Not on file     Family History: The patient's family history includes Bone cancer in her father; Breast cancer in her sister; CAD in an other family member; COPD in her brother; Diabetes in an other family member; Hypertension in an other family member; Lung cancer in her father; Lung  disease in her maternal uncle, maternal uncle, and mother; Lymphoma in her sister; Multiple sclerosis in her brother; Muscular dystrophy in her brother; Seizures in an other family member. There is no history of Colon cancer, Colon polyps, Esophageal cancer, Stomach cancer, or Rectal cancer.  ROS:   Please see the history of present illness.    All other systems reviewed and are negative.  EKGs/Labs/Other Studies Reviewed:    The following studies were reviewed today: I discussed my findings with the patient at length.   Recent Labs: 09/13/2021: ALT 29; Hemoglobin 14.6; Platelets 266 09/16/2021: BUN 8; Creatinine, Ser 0.65; Potassium 3.8; Sodium 135  Recent Lipid Panel    Component Value Date/Time   CHOL 214 (H) 06/18/2020 0925   TRIG 268 (H) 06/18/2020 0925   HDL 42 06/18/2020 0925   CHOLHDL 5.1 (H) 06/18/2020 0925   CHOLHDL 5.7 07/12/2018 0333   VLDL 29 07/12/2018 0333   LDLCALC 125 (H) 06/18/2020 0925    Physical Exam:    VS:  BP (!) 146/76   Pulse 87   Ht '5\' 5"'$  (1.651 m)   Wt 176 lb (79.8 kg)   SpO2 97%   BMI 29.29 kg/m     Wt Readings from Last 3 Encounters:  01/31/22 176 lb (79.8 kg)  09/16/21 179 lb (81.2 kg)  09/13/21 183 lb 6.8 oz (83.2 kg)     GEN: Patient is in no acute  distress HEENT: Normal NECK: No JVD; No carotid bruits LYMPHATICS: No lymphadenopathy CARDIAC: Hear sounds regular, 2/6 systolic murmur at the apex. RESPIRATORY:  Clear to auscultation without rales, wheezing or rhonchi  ABDOMEN: Soft, non-tender, non-distended MUSCULOSKELETAL:  No edema; No deformity  SKIN: Warm and dry NEUROLOGIC:  Alert and oriented x 3 PSYCHIATRIC:  Normal affect   Signed, Jenean Lindau, MD  01/31/2022 9:34 AM    [place]

## 2022-02-01 ENCOUNTER — Telehealth: Payer: Self-pay | Admitting: Cardiology

## 2022-02-01 NOTE — Telephone Encounter (Signed)
Pt calling back to f/u on Clearance. She wants to make sure that requested info from [facility] was sent over. Pt would also like a callback once this is done. Please advise

## 2022-02-01 NOTE — Telephone Encounter (Signed)
Returned call to pt.  She is aware that Dr. Ervin Knack office note / clearance / has been sent to [facility].  She was thankful for the call back.

## 2022-02-01 NOTE — Telephone Encounter (Signed)
See 8/16 encounter. Patient states [facility] has not received clearance recommendation. Patient is requesting to have this sent to them ASAP so that she can go forward with her procedure. Please return call to patient to confirm once this has been taken care of.

## 2022-02-14 ENCOUNTER — Ambulatory Visit: Payer: Self-pay | Admitting: Student

## 2022-02-14 DIAGNOSIS — E1121 Type 2 diabetes mellitus with diabetic nephropathy: Secondary | ICD-10-CM

## 2022-02-14 NOTE — H&P (View-Only) (Signed)
TOTAL KNEE ADMISSION H&P  Patient is being admitted for right total knee arthroplasty.  Subjective:  Chief Complaint:right knee pain.  HPI: Brenda Cochran, 71 y.o. female, has a history of pain and functional disability in the right knee due to arthritis and has failed non-surgical conservative treatments for greater than 12 weeks to includeNSAID's and/or analgesics, corticosteriod injections, flexibility and strengthening excercises, use of assistive devices, and activity modification.  Onset of symptoms was gradual, starting 10 years ago with rapidlly worsening course since that time. The patient noted no past surgery on the right knee(s).  Patient currently rates pain in the right knee(s) at 10 out of 10 with activity. Patient has night pain, worsening of pain with activity and weight bearing, pain that interferes with activities of daily living, pain with passive range of motion, crepitus, and joint swelling.  Patient has evidence of subchondral cysts, subchondral sclerosis, periarticular osteophytes, and joint space narrowing by imaging studies. There is no active infection.  Patient Active Problem List   Diagnosis Date Noted   Anxiety 01/30/2022   Family history of colonic polyps 01/30/2022   History of colon polyps 01/30/2022   Preoperative cardiovascular examination 08/30/2021   Chronic fatigue syndrome 12/22/2020   Obesity 12/22/2020   COPD with acute exacerbation ([place]) 11/10/2020   COPD mixed type ([place]) 11/09/2020   Hyperlipidemia    Pneumonia due to COVID-19 virus 01/06/2020   Asthma    Atherosclerotic heart disease of native coronary artery without angina pectoris    Chronic maxillary sinusitis    COPD (chronic obstructive pulmonary disease) (HCC)    Headache    Hypertensive heart disease without heart failure    Major depressive disorder, single episode, mild ([place])    Menopausal and female climacteric states    Nicotine dependence, cigarettes, with unspecified  nicotine-induced disorders    Overweight    Sleep apnea    Type 2 diabetes mellitus ([place])    Type 2 diabetes mellitus with diabetic nephropathy (HCC)    Vitamin D deficiency, unspecified    Pleural effusion, not elsewhere classified 12/30/2019   Loculated pleural effusion 12/30/2019   Shortness of breath 10/29/2019   Cough 10/29/2019   Cigarette nicotine dependence with nicotine-induced disorder 08/18/2019   Palpitations 08/15/2019   Chronic tension-type headache, intractable 08/07/2019   Continuous dependence on cigarette smoking 06/09/2019   Angina pectoris ([place]) 01/10/2019   CAD (coronary artery disease) 09/23/2018   Dyslipidemia associated with type 2 diabetes mellitus ([place]) 08/23/2018   Coronary artery disease involving native coronary artery of native heart without angina pectoris 07/25/2018   Essential hypertension 07/18/2018   NSTEMI (non-ST elevated myocardial infarction) ([place]) 07/12/2018   Pain in right knee 07/04/2017   Numbness and tingling in left upper extremity    TIA (transient ischemic attack) 09/14/2016   OSA (obstructive sleep apnea) 01/13/2014   Asthma, chronic obstructive, with acute exacerbation (HCC)    Primary osteoarthritis    Mixed hyperlipidemia    Fibromyalgia    RA (rheumatoid arthritis) ([place])    Skin cancer    Hypothyroidism (acquired)    GERD (gastroesophageal reflux disease)    Depression    Past Medical History:  Diagnosis Date   Angina pectoris ([place]) 01/10/2019   Anxiety    Asthma    Asthma, chronic obstructive, with acute exacerbation ([place])    Brenda Cochran 01/13/2014  FEV1 76% predicted FVC 77% predicted FEV1 to FVC ratio 100% predicted FEF 25 7569% predicted CXR 01/13/14: NAD Alpha one antitrypsin 156  IgE  56 sl elevated.  RAST neg     Atherosclerotic heart disease of native coronary artery without angina pectoris    CAD (coronary artery disease) 09/23/2018   Chronic fatigue syndrome 12/22/2020   Chronic maxillary sinusitis    Chronic  tension-type headache, intractable 08/07/2019   Cigarette nicotine dependence with nicotine-induced disorder 08/18/2019   Continuous dependence on cigarette smoking 06/09/2019   COPD (chronic obstructive pulmonary disease) (HCC)    COPD mixed type ([name]) 11/09/2020   COPD with acute exacerbation ([place]) 11/10/2020   Coronary artery disease involving native coronary artery of native heart without angina pectoris 07/25/2018   Cough 10/29/2019   Depression    Dyslipidemia associated with type 2 diabetes mellitus ([place]) 08/23/2018   Essential hypertension 07/18/2018   Family history of colonic polyps    Fibromyalgia    GERD (gastroesophageal reflux disease)    Headache    History of colon polyps    Hyperlipidemia    Hypertensive heart disease without heart failure    Hypothyroidism (acquired)    Loculated pleural effusion 12/30/2019   Major depressive disorder, single episode, mild ([name])    Menopausal and female climacteric states    Mixed hyperlipidemia    Nicotine dependence, cigarettes, with unspecified nicotine-induced disorders    NSTEMI (non-ST elevated myocardial infarction) ([place]) 07/12/2018   Numbness and tingling in left upper extremity    Obesity 12/22/2020   OSA (obstructive sleep apnea) 01/13/2014   Sleep study 02/19/14 : AHI 5.5.  Mild sleep apnea.  Lowest SpO2 81%    Overweight    Pain in right knee 07/04/2017   Palpitations 08/15/2019   Pleural effusion, not elsewhere classified 12/30/2019   Pneumonia due to COVID-19 virus 01/06/2020   Preoperative cardiovascular examination 08/30/2021   Primary osteoarthritis    RA (rheumatoid arthritis) ([name])    Brenda Cochran   Shortness of breath 10/29/2019   Skin cancer    Brenda Cochran   Sleep apnea    cpap   TIA (transient ischemic attack) 09/14/2016   Type 2 diabetes mellitus ([place])    Type 2 diabetes mellitus with diabetic nephropathy ([place])    Vitamin D deficiency, unspecified     Past Surgical History:  Procedure Laterality Date    ABDOMINAL HYSTERECTOMY  06/05/1982   BREAST LUMPECTOMY  06/05/1974   left, benign   CHOLECYSTECTOMY  09/05/2010   COLONOSCOPY  02/07/2013   Colon polyp-status post polypectomy. Small internal hemorrhoids. Otherwise grossly normal colonoscopy   CORONARY STENT INTERVENTION N/A 07/12/2018   Procedure: CORONARY STENT INTERVENTION;  Surgeon: Jettie Booze, MD;  Location: [facility];  Service: Cardiovascular;  Laterality: N/A;   CORONARY THROMBECTOMY N/A 07/12/2018   Procedure: Coronary Thrombectomy;  Surgeon: Jettie Booze, MD;  Location: [facility];  Service: Cardiovascular;  Laterality: N/A;   ESOPHAGOGASTRODUODENOSCOPY  03/26/2017   Moderate gastritis. Otherwise normal EGD   HEEL SPUR SURGERY     foot for spurs   INTRAVASCULAR PRESSURE WIRE/FFR STUDY N/A 01/21/2019   Procedure: INTRAVASCULAR PRESSURE WIRE/FFR STUDY;  Surgeon: Jettie Booze, MD;  Location: [facility];  Service: Cardiovascular;  Laterality: N/A;   INTRAVASCULAR PRESSURE WIRE/FFR STUDY N/A 09/16/2021   Procedure: INTRAVASCULAR PRESSURE WIRE/FFR STUDY;  Surgeon: Belva Crome, MD;  Location: [facility];  Service: Cardiovascular;  Laterality: N/A;   LEFT HEART CATH AND CORONARY ANGIOGRAPHY N/A 07/12/2018   Procedure: LEFT HEART CATH AND CORONARY ANGIOGRAPHY;  Surgeon: Jettie Booze, MD;  Location: [facility];  Service: Cardiovascular;  Laterality: N/A;   LEFT HEART CATH AND CORONARY ANGIOGRAPHY N/A 01/21/2019   Procedure: LEFT HEART CATH AND CORONARY ANGIOGRAPHY;  Surgeon: Jettie Booze, MD;  Location: [facility];  Service: Cardiovascular;  Laterality: N/A;   LEFT HEART CATH AND CORONARY ANGIOGRAPHY N/A 09/16/2021   Procedure: LEFT HEART CATH AND CORONARY ANGIOGRAPHY;  Surgeon: Belva Crome, MD;  Location: [facility];  Service: Cardiovascular;  Laterality: N/A;   TONSILLECTOMY      Current Outpatient Medications  Medication Sig  Dispense Refill Last Dose   albuterol (PROVENTIL) (2.5 MG/3ML) 0.083% nebulizer solution Take 3 mLs (2.5 mg total) by nebulization every 6 (six) hours as needed for wheezing or shortness of breath. 120 mL 11    albuterol (VENTOLIN HFA) 108 (90 Base) MCG/ACT inhaler Inhale 1-2 puffs into the lungs every 6 (six) hours as needed for wheezing or shortness of breath.      aspirin EC 81 MG tablet Take 1 tablet (81 mg total) by mouth daily. Swallow whole. 150 tablet 2    Budeson-Glycopyrrol-Formoterol (BREZTRI AEROSPHERE) 160-9-4.8 MCG/ACT AERO Inhale 2 puffs into the lungs 2 (two) times daily. 10.7 g 3    cyanocobalamin (,VITAMIN B-12,) 1000 MCG/ML injection Inject 1,000 mcg into the muscle every 30 (thirty) days.      ezetimibe (ZETIA) 10 MG tablet Take 10 mg by mouth at bedtime.      fluticasone (FLONASE) 50 MCG/ACT nasal spray Place 1 spray into both nostrils daily as needed for allergies or rhinitis.      JARDIANCE 25 MG TABS tablet Take 25 mg by mouth daily.      leflunomide (ARAVA) 20 MG tablet Take 20 mg by mouth daily.      nitroGLYCERIN (NITROSTAT) 0.4 MG SL tablet Place 1 tablet (0.4 mg total) under the tongue every 5 (five) minutes x 3 doses as needed for chest pain. 25 tablet 12    nitroGLYCERIN (NITROSTAT) 0.4 MG SL tablet Place 1 tablet (0.4 mg total) under the tongue every 5 (five) minutes as needed for chest pain. (Patient not taking: Reported on 01/31/2022) 100 tablet 3    nystatin (MYCOSTATIN/NYSTOP) powder Apply 1 application. topically 2 (two) times daily as needed (irritation).      rosuvastatin (CRESTOR) 40 MG tablet Take 40 mg by mouth daily.      TRULICITY 3 GU/5.4YH SOPN Inject 3 mg into the skin once a week.      No current facility-administered medications for this visit.   Allergies  Allergen Reactions   Shellfish Allergy Anaphylaxis   Contrast Media [Iodinated Contrast Media] Rash   Metformin And Related Nausea And Vomiting   Duloxetine     Other reaction(s): Not  available   Oxycodone     Other reaction(s): Not available   Amitriptyline Hcl Other (See Comments)    Somnolence   Cymbalta [Duloxetine Hcl] Nausea And Vomiting   Iohexol Rash   Oxycontin [Oxycodone Hcl] Palpitations   Ultram [Tramadol] Nausea And Vomiting    Social History   Tobacco Use   Smoking status: Former    Packs/day: 4.00    Years: 30.00    Total pack years: 120.00    Types: Cigarettes   Smokeless tobacco: Never   Tobacco comments:    Quit 2 weeks ago, 11/15/2020  Substance Use Topics   Alcohol use: No    Family History  Problem Relation Age of Onset   Breast cancer Sister  Lymphoma Sister    Multiple sclerosis Brother    Muscular dystrophy Brother    CAD Other    Diabetes Other    Hypertension Other    Seizures Other    Lung disease Mother        never smoker   Lung disease Maternal Uncle        lung cancer   Lung disease Maternal Uncle    Lung cancer Father    Bone cancer Father    COPD Brother    Colon cancer Neg Hx    Colon polyps Neg Hx    Esophageal cancer Neg Hx    Stomach cancer Neg Hx    Rectal cancer Neg Hx      Review of Systems  Musculoskeletal:  Positive for arthralgias, gait problem and joint swelling.  All other systems reviewed and are negative.   Objective:  Physical Exam Constitutional:      Appearance: Normal appearance.  HENT:     Head: Normocephalic and atraumatic.     Nose: Nose normal.     Mouth/Throat:     Mouth: Mucous membranes are moist.     Pharynx: Oropharynx is clear.  Eyes:     Extraocular Movements: Extraocular movements intact.  Cardiovascular:     Rate and Rhythm: Normal rate and regular rhythm.     Pulses: Normal pulses.     Heart sounds: Normal heart sounds.  Pulmonary:     Effort: Pulmonary effort is normal.     Breath sounds: Normal breath sounds.  Abdominal:     General: Abdomen is flat.     Palpations: Abdomen is soft.  Genitourinary:    Comments: deferred Musculoskeletal:     Cervical  back: Normal range of motion and neck supple.     Comments: Examination of the right knee reveals no skin wounds or lesions. She has swelling, trace effusion. No warmth or erythema. Varus deformity. Tenderness to palpation medial joint line, lateral joint line, peripatellar retinacular tissues with a positive grind sign. Swelling and tenderness to palpation over the pes insertion. Active range of motion is 26 to 120 degrees. No ligamentous instability. Painless range of motion of the hip.  Neurovascularly intact distally.  Skin:    General: Skin is warm and dry.     Capillary Refill: Capillary refill takes less than 2 seconds.  Neurological:     General: No focal deficit present.     Mental Status: She is alert and oriented to person, place, and time.  Psychiatric:        Mood and Affect: Mood normal.        Behavior: Behavior normal.        Thought Content: Thought content normal.        Judgment: Judgment normal.     Vital signs in last 24 hours: '@VSRANGES'$ @  Labs:   Estimated body mass index is 29.29 kg/m as calculated from the following:   Height as of 01/31/22: '5\' 5"'$  (1.651 m).   Weight as of 01/31/22: 79.8 kg.   Imaging Review Plain radiographs demonstrate severe degenerative joint disease of the right knee(s). The overall alignment ismild varus. The bone quality appears to be adequate for age and reported activity level.      Assessment/Plan:  End stage arthritis, right knee   The patient history, physical examination, clinical judgment of the provider and imaging studies are consistent with end stage degenerative joint disease of the right knee(s) and total knee arthroplasty is deemed  medically necessary. The treatment options including medical management, injection therapy arthroscopy and arthroplasty were discussed at length. The risks and benefits of total knee arthroplasty were presented and reviewed. The risks due to aseptic loosening, infection, stiffness, patella  tracking problems, thromboembolic complications and other imponderables were discussed. The patient acknowledged the explanation, agreed to proceed with the plan and consent was signed. Patient is being admitted for inpatient treatment for surgery, pain control, PT, OT, prophylactic antibiotics, VTE prophylaxis, progressive ambulation and ADL's and discharge planning. The patient is planning to be discharged home after an overnight stay with OPPT.  Therapy Plans: outpatient therapy  [facility] in [place], [place].  Disposition: Home with daughter Planned DVT Prophylaxis: aspirin '81mg'$  BID DME needed: walker. Iceman today.  PCP: Cleared Cardiology: Cleared TXA: IV Allergies:  - Duloxetine- unsure - Iodinated contrast media - Red, SOB - Metformin - vomiting - Oxycodone - heart racing - Shellfish - anaphylaxis.  - Ultram - vomiting.  Anesthesia Concerns: None BMI: 28.6 Last HgbA1c: 6.9 Other: - Cr. 0.52, Hgb 16.0, A1c 6.3 - Quit smoking: Reported May 2023.  - T2DM with neuropathy - CAD - Asthma - History of phlebitis - OSA, doesn't use CPAP. - RA - arava (leflunomide). Last dose 02/10/22. Patient instructed not to take.     Patient's anticipated LOS is less than 2 midnights, meeting these requirements: - Younger than 15 - Lives within 1 hour of care - Has a competent adult at home to recover with post-op recover - NO history of  - Chronic pain requiring opiods  - Heart failure  - Heart attack  - Stroke  - DVT/VTE  - Cardiac arrhythmia  - Respiratory Failure/COPD  - Renal failure  - Anemia  - Advanced Liver disease

## 2022-02-14 NOTE — Progress Notes (Signed)
Sent message, via epic in basket, requesting orders in epic from surgeon.  

## 2022-02-14 NOTE — H&P (Signed)
TOTAL KNEE ADMISSION H&P  Patient is being admitted for right total knee arthroplasty.  Subjective:  Chief Complaint:right knee pain.  HPI: Brenda Cochran, 71 y.o. female, has a history of pain and functional disability in the right knee due to arthritis and has failed non-surgical conservative treatments for greater than 12 weeks to includeNSAID's and/or analgesics, corticosteriod injections, flexibility and strengthening excercises, use of assistive devices, and activity modification.  Onset of symptoms was gradual, starting 10 years ago with rapidlly worsening course since that time. The patient noted no past surgery on the right knee(s).  Patient currently rates pain in the right knee(s) at 10 out of 10 with activity. Patient has night pain, worsening of pain with activity and weight bearing, pain that interferes with activities of daily living, pain with passive range of motion, crepitus, and joint swelling.  Patient has evidence of subchondral cysts, subchondral sclerosis, periarticular osteophytes, and joint space narrowing by imaging studies. There is no active infection.  Patient Active Problem List   Diagnosis Date Noted   Anxiety 01/30/2022   Family history of colonic polyps 01/30/2022   History of colon polyps 01/30/2022   Preoperative cardiovascular examination 08/30/2021   Chronic fatigue syndrome 12/22/2020   Obesity 12/22/2020   COPD with acute exacerbation ([place]) 11/10/2020   COPD mixed type ([place]) 11/09/2020   Hyperlipidemia    Pneumonia due to COVID-19 virus 01/06/2020   Asthma    Atherosclerotic heart disease of native coronary artery without angina pectoris    Chronic maxillary sinusitis    COPD (chronic obstructive pulmonary disease) (HCC)    Headache    Hypertensive heart disease without heart failure    Major depressive disorder, single episode, mild ([place])    Menopausal and female climacteric states    Nicotine dependence, cigarettes, with unspecified  nicotine-induced disorders    Overweight    Sleep apnea    Type 2 diabetes mellitus ([place])    Type 2 diabetes mellitus with diabetic nephropathy (HCC)    Vitamin D deficiency, unspecified    Pleural effusion, not elsewhere classified 12/30/2019   Loculated pleural effusion 12/30/2019   Shortness of breath 10/29/2019   Cough 10/29/2019   Cigarette nicotine dependence with nicotine-induced disorder 08/18/2019   Palpitations 08/15/2019   Chronic tension-type headache, intractable 08/07/2019   Continuous dependence on cigarette smoking 06/09/2019   Angina pectoris ([place]) 01/10/2019   CAD (coronary artery disease) 09/23/2018   Dyslipidemia associated with type 2 diabetes mellitus ([place]) 08/23/2018   Coronary artery disease involving native coronary artery of native heart without angina pectoris 07/25/2018   Essential hypertension 07/18/2018   NSTEMI (non-ST elevated myocardial infarction) ([place]) 07/12/2018   Pain in right knee 07/04/2017   Numbness and tingling in left upper extremity    TIA (transient ischemic attack) 09/14/2016   OSA (obstructive sleep apnea) 01/13/2014   Asthma, chronic obstructive, with acute exacerbation (HCC)    Primary osteoarthritis    Mixed hyperlipidemia    Fibromyalgia    RA (rheumatoid arthritis) ([place])    Skin cancer    Hypothyroidism (acquired)    GERD (gastroesophageal reflux disease)    Depression    Past Medical History:  Diagnosis Date   Angina pectoris ([place]) 01/10/2019   Anxiety    Asthma    Asthma, chronic obstructive, with acute exacerbation ([place])    [name] 01/13/2014  FEV1 76% predicted FVC 77% predicted FEV1 to FVC ratio 100% predicted FEF 25 7569% predicted CXR 01/13/14: NAD Alpha one antitrypsin 156  IgE  56 sl elevated.  RAST neg     Atherosclerotic heart disease of native coronary artery without angina pectoris    CAD (coronary artery disease) 09/23/2018   Chronic fatigue syndrome 12/22/2020   Chronic maxillary sinusitis    Chronic  tension-type headache, intractable 08/07/2019   Cigarette nicotine dependence with nicotine-induced disorder 08/18/2019   Continuous dependence on cigarette smoking 06/09/2019   COPD (chronic obstructive pulmonary disease) (HCC)    COPD mixed type ([place]) 11/09/2020   COPD with acute exacerbation ([place]) 11/10/2020   Coronary artery disease involving native coronary artery of native heart without angina pectoris 07/25/2018   Cough 10/29/2019   Depression    Dyslipidemia associated with type 2 diabetes mellitus ([place]) 08/23/2018   Essential hypertension 07/18/2018   Family history of colonic polyps    Fibromyalgia    GERD (gastroesophageal reflux disease)    Headache    History of colon polyps    Hyperlipidemia    Hypertensive heart disease without heart failure    Hypothyroidism (acquired)    Loculated pleural effusion 12/30/2019   Major depressive disorder, single episode, mild ([place])    Menopausal and female climacteric states    Mixed hyperlipidemia    Nicotine dependence, cigarettes, with unspecified nicotine-induced disorders    NSTEMI (non-ST elevated myocardial infarction) ([place]) 07/12/2018   Numbness and tingling in left upper extremity    Obesity 12/22/2020   OSA (obstructive sleep apnea) 01/13/2014   Sleep study 02/19/14 : AHI 5.5.  Mild sleep apnea.  Lowest SpO2 81%    Overweight    Pain in right knee 07/04/2017   Palpitations 08/15/2019   Pleural effusion, not elsewhere classified 12/30/2019   Pneumonia due to COVID-19 virus 01/06/2020   Preoperative cardiovascular examination 08/30/2021   Primary osteoarthritis    RA (rheumatoid arthritis) ([place])    [name]   Shortness of breath 10/29/2019   Skin cancer    face   Sleep apnea    cpap   TIA (transient ischemic attack) 09/14/2016   Type 2 diabetes mellitus ([place])    Type 2 diabetes mellitus with diabetic nephropathy ([place])    Vitamin D deficiency, unspecified     Past Surgical History:  Procedure Laterality Date    ABDOMINAL HYSTERECTOMY  06/05/1982   BREAST LUMPECTOMY  06/05/1974   left, benign   CHOLECYSTECTOMY  09/05/2010   COLONOSCOPY  02/07/2013   Colon polyp-status post polypectomy. Small internal hemorrhoids. Otherwise grossly normal colonoscopy   CORONARY STENT INTERVENTION N/A 07/12/2018   Procedure: CORONARY STENT INTERVENTION;  Surgeon: Jettie Booze, MD;  Location: [facility];  Service: Cardiovascular;  Laterality: N/A;   CORONARY THROMBECTOMY N/A 07/12/2018   Procedure: Coronary Thrombectomy;  Surgeon: Jettie Booze, MD;  Location: [facility];  Service: Cardiovascular;  Laterality: N/A;   ESOPHAGOGASTRODUODENOSCOPY  03/26/2017   Moderate gastritis. Otherwise normal EGD   HEEL SPUR SURGERY     foot for spurs   INTRAVASCULAR PRESSURE WIRE/FFR STUDY N/A 01/21/2019   Procedure: INTRAVASCULAR PRESSURE WIRE/FFR STUDY;  Surgeon: Jettie Booze, MD;  Location: [facility];  Service: Cardiovascular;  Laterality: N/A;   INTRAVASCULAR PRESSURE WIRE/FFR STUDY N/A 09/16/2021   Procedure: INTRAVASCULAR PRESSURE WIRE/FFR STUDY;  Surgeon: Belva Crome, MD;  Location: [facility];  Service: Cardiovascular;  Laterality: N/A;   LEFT HEART CATH AND CORONARY ANGIOGRAPHY N/A 07/12/2018   Procedure: LEFT HEART CATH AND CORONARY ANGIOGRAPHY;  Surgeon: Jettie Booze, MD;  Location: [facility];  Service: Cardiovascular;  Laterality: N/A;   LEFT HEART CATH AND CORONARY ANGIOGRAPHY N/A 01/21/2019   Procedure: LEFT HEART CATH AND CORONARY ANGIOGRAPHY;  Surgeon: Jettie Booze, MD;  Location: [facility];  Service: Cardiovascular;  Laterality: N/A;   LEFT HEART CATH AND CORONARY ANGIOGRAPHY N/A 09/16/2021   Procedure: LEFT HEART CATH AND CORONARY ANGIOGRAPHY;  Surgeon: Belva Crome, MD;  Location: [facility];  Service: Cardiovascular;  Laterality: N/A;   TONSILLECTOMY      Current Outpatient Medications  Medication Sig  Dispense Refill Last Dose   albuterol (PROVENTIL) (2.5 MG/3ML) 0.083% nebulizer solution Take 3 mLs (2.5 mg total) by nebulization every 6 (six) hours as needed for wheezing or shortness of breath. 120 mL 11    albuterol (VENTOLIN HFA) 108 (90 Base) MCG/ACT inhaler Inhale 1-2 puffs into the lungs every 6 (six) hours as needed for wheezing or shortness of breath.      aspirin EC 81 MG tablet Take 1 tablet (81 mg total) by mouth daily. Swallow whole. 150 tablet 2    Budeson-Glycopyrrol-Formoterol (BREZTRI AEROSPHERE) 160-9-4.8 MCG/ACT AERO Inhale 2 puffs into the lungs 2 (two) times daily. 10.7 g 3    cyanocobalamin (,VITAMIN B-12,) 1000 MCG/ML injection Inject 1,000 mcg into the muscle every 30 (thirty) days.      ezetimibe (ZETIA) 10 MG tablet Take 10 mg by mouth at bedtime.      fluticasone (FLONASE) 50 MCG/ACT nasal spray Place 1 spray into both nostrils daily as needed for allergies or rhinitis.      JARDIANCE 25 MG TABS tablet Take 25 mg by mouth daily.      leflunomide (ARAVA) 20 MG tablet Take 20 mg by mouth daily.      nitroGLYCERIN (NITROSTAT) 0.4 MG SL tablet Place 1 tablet (0.4 mg total) under the tongue every 5 (five) minutes x 3 doses as needed for chest pain. 25 tablet 12    nitroGLYCERIN (NITROSTAT) 0.4 MG SL tablet Place 1 tablet (0.4 mg total) under the tongue every 5 (five) minutes as needed for chest pain. (Patient not taking: Reported on 01/31/2022) 100 tablet 3    nystatin (MYCOSTATIN/NYSTOP) powder Apply 1 application. topically 2 (two) times daily as needed (irritation).      rosuvastatin (CRESTOR) 40 MG tablet Take 40 mg by mouth daily.      TRULICITY 3 YK/9.9IP SOPN Inject 3 mg into the skin once a week.      No current facility-administered medications for this visit.   Allergies  Allergen Reactions   Shellfish Allergy Anaphylaxis   Contrast Media [Iodinated Contrast Media] Rash   Metformin And Related Nausea And Vomiting   Duloxetine     Other reaction(s): Not  available   Oxycodone     Other reaction(s): Not available   Amitriptyline Hcl Other (See Comments)    Somnolence   Cymbalta [Duloxetine Hcl] Nausea And Vomiting   Iohexol Rash   Oxycontin [Oxycodone Hcl] Palpitations   Ultram [Tramadol] Nausea And Vomiting    Social History   Tobacco Use   Smoking status: Former    Packs/day: 4.00    Years: 30.00    Total pack years: 120.00    Types: Cigarettes   Smokeless tobacco: Never   Tobacco comments:    Quit 2 weeks ago, 11/15/2020  Substance Use Topics   Alcohol use: No    Family History  Problem Relation Age of Onset   Breast cancer Sister  Lymphoma Sister    Multiple sclerosis Brother    Muscular dystrophy Brother    CAD Other    Diabetes Other    Hypertension Other    Seizures Other    Lung disease Mother        never smoker   Lung disease Maternal Uncle        lung cancer   Lung disease Maternal Uncle    Lung cancer Father    Bone cancer Father    COPD Brother    Colon cancer Neg Hx    Colon polyps Neg Hx    Esophageal cancer Neg Hx    Stomach cancer Neg Hx    Rectal cancer Neg Hx      Review of Systems  Musculoskeletal:  Positive for arthralgias, gait problem and joint swelling.  All other systems reviewed and are negative.   Objective:  Physical Exam Constitutional:      Appearance: Normal appearance.  HENT:     Head: Normocephalic and atraumatic.     Nose: Nose normal.     Mouth/Throat:     Mouth: Mucous membranes are moist.     Pharynx: Oropharynx is clear.  Eyes:     Extraocular Movements: Extraocular movements intact.  Cardiovascular:     Rate and Rhythm: Normal rate and regular rhythm.     Pulses: Normal pulses.     Heart sounds: Normal heart sounds.  Pulmonary:     Effort: Pulmonary effort is normal.     Breath sounds: Normal breath sounds.  Abdominal:     General: Abdomen is flat.     Palpations: Abdomen is soft.  Genitourinary:    Comments: deferred Musculoskeletal:     Cervical  back: Normal range of motion and neck supple.     Comments: Examination of the right knee reveals no skin wounds or lesions. She has swelling, trace effusion. No warmth or erythema. Varus deformity. Tenderness to palpation medial joint line, lateral joint line, peripatellar retinacular tissues with a positive grind sign. Swelling and tenderness to palpation over the pes insertion. Active range of motion is 26 to 120 degrees. No ligamentous instability. Painless range of motion of the hip.  Neurovascularly intact distally.  Skin:    General: Skin is warm and dry.     Capillary Refill: Capillary refill takes less than 2 seconds.  Neurological:     General: No focal deficit present.     Mental Status: She is alert and oriented to person, place, and time.  Psychiatric:        Mood and Affect: Mood normal.        Behavior: Behavior normal.        Thought Content: Thought content normal.        Judgment: Judgment normal.     Vital signs in last 24 hours: '@VSRANGES'$ @  Labs:   Estimated body mass index is 29.29 kg/m as calculated from the following:   Height as of 01/31/22: '5\' 5"'$  (1.651 m).   Weight as of 01/31/22: 79.8 kg.   Imaging Review Plain radiographs demonstrate severe degenerative joint disease of the right knee(s). The overall alignment ismild varus. The bone quality appears to be adequate for age and reported activity level.      Assessment/Plan:  End stage arthritis, right knee   The patient history, physical examination, clinical judgment of the provider and imaging studies are consistent with end stage degenerative joint disease of the right knee(s) and total knee arthroplasty is deemed  medically necessary. The treatment options including medical management, injection therapy arthroscopy and arthroplasty were discussed at length. The risks and benefits of total knee arthroplasty were presented and reviewed. The risks due to aseptic loosening, infection, stiffness, patella  tracking problems, thromboembolic complications and other imponderables were discussed. The patient acknowledged the explanation, agreed to proceed with the plan and consent was signed. Patient is being admitted for inpatient treatment for surgery, pain control, PT, OT, prophylactic antibiotics, VTE prophylaxis, progressive ambulation and ADL's and discharge planning. The patient is planning to be discharged home after an overnight stay with OPPT.  Therapy Plans: outpatient therapy  [facility] in [place], [place].  Disposition: Home with daughter Planned DVT Prophylaxis: aspirin '81mg'$  BID DME needed: walker. Iceman today.  PCP: Cleared Cardiology: Cleared TXA: IV Allergies:  - Duloxetine- unsure - Iodinated contrast media - Red, SOB - Metformin - vomiting - Oxycodone - heart racing - Shellfish - anaphylaxis.  - Ultram - vomiting.  Anesthesia Concerns: None BMI: 28.6 Last HgbA1c: 6.9 Other: - Cr. 0.52, Hgb 16.0, A1c 6.3 - Quit smoking: Reported May 2023.  - T2DM with neuropathy - CAD - Asthma - History of phlebitis - OSA, doesn't use CPAP. - RA - arava (leflunomide). Last dose 02/10/22. Patient instructed not to take.     Patient's anticipated LOS is less than 2 midnights, meeting these requirements: - Younger than 79 - Lives within 1 hour of care - Has a competent adult at home to recover with post-op recover - NO history of  - Chronic pain requiring opiods  - Heart failure  - Heart attack  - Stroke  - DVT/VTE  - Cardiac arrhythmia  - Respiratory Failure/COPD  - Renal failure  - Anemia  - Advanced Liver disease

## 2022-02-17 NOTE — Patient Instructions (Addendum)
SURGICAL WAITING ROOM VISITATION Patients having surgery or a procedure may have no more than 2 support people in the waiting area - these visitors may rotate.   Children under the age of 41 must have an adult with them who is not the patient. If the patient needs to stay at the hospital during part of their recovery, the visitor guidelines for inpatient rooms apply. Pre-op nurse will coordinate an appropriate time for 1 support person to accompany patient in pre-op.  This support person may not rotate.    Please refer to the [facility] website for the visitor guidelines for Inpatients (after your surgery is over and you are in a regular room).    Your procedure is scheduled on: 03/01/22   Report to [facility] Main Entrance    Report to admitting at 8:00 AM   Call this number if you have problems the morning of surgery [phone]   Do not eat food :After Midnight.   After Midnight you may have the following liquids until 7:30 AM DAY OF SURGERY  Water Non-Citrus Juices (without pulp, NO RED) Carbonated Beverages Black Coffee (NO MILK/CREAM OR CREAMERS, sugar ok)  Clear Tea (NO MILK/CREAM OR CREAMERS, sugar ok) regular and decaf                             Plain Jell-O (NO RED)                                           Fruit ices (not with fruit pulp, NO RED)                                     Popsicles (NO RED)                                                               Sports drinks like Gatorade (NO RED)                 The day of surgery:  Drink ONE (1) Pre-Surgery G2 at 7:30 AM the morning of surgery. Drink in one sitting. Do not sip.  This drink was given to you during your hospital  pre-op appointment visit. Nothing else to drink after completing the  Pre-Surgery G2.          If you have questions, please contact your surgeon's office.   FOLLOW BOWEL PREP AND ANY ADDITIONAL PRE OP INSTRUCTIONS YOU RECEIVED FROM YOUR SURGEON'S OFFICE!!!     Oral Hygiene  is also important to reduce your risk of infection.                                    Remember - BRUSH YOUR TEETH THE MORNING OF SURGERY WITH YOUR REGULAR TOOTHPASTE   Take these medicines the morning of surgery with A SIP OF WATER: Albuterol, Rosuvastatin   How to Manage Your Diabetes Before and After Surgery  Why is it important to control my blood  sugar before and after surgery? Improving blood sugar levels before and after surgery helps healing and can limit problems. A way of improving blood sugar control is eating a healthy diet by:  Eating less sugar and carbohydrates  Increasing activity/exercise  Talking with your doctor about reaching your blood sugar goals High blood sugars (greater than 180 mg/dL) can raise your risk of infections and slow your recovery, so you will need to focus on controlling your diabetes during the weeks before surgery. Make sure that the doctor who takes care of your diabetes knows about your planned surgery including the date and location.  How do I manage my blood sugar before surgery? Check your blood sugar at least 4 times a day, starting 2 days before surgery, to make sure that the level is not too high or low. Check your blood sugar the morning of your surgery when you wake up and every 2 hours until you get to the Short Stay unit. If your blood sugar is less than 70 mg/dL, you will need to treat for low blood sugar: Do not take insulin. Treat a low blood sugar (less than 70 mg/dL) with  cup of clear juice (cranberry or apple), 4 glucose tablets, OR glucose gel. Recheck blood sugar in 15 minutes after treatment (to make sure it is greater than 70 mg/dL). If your blood sugar is not greater than 70 mg/dL on recheck, call [phone] for further instructions. Report your blood sugar to the short stay nurse when you get to Short Stay.  If you are admitted to the hospital after surgery: Your blood sugar will be checked by the staff and you will  probably be given insulin after surgery (instead of oral diabetes medicines) to make sure you have good blood sugar levels. The goal for blood sugar control after surgery is 80-180 mg/dL.   WHAT DO I DO ABOUT MY DIABETES MEDICATION?  Do not take oral diabetes medicines (pills) the morning of surgery.  Do not take Trulicity 2[birth year]. May resume the following week  Hold Jardiace 72 hours before surgery (3 days)     The day of surgery, do not take other diabetes injectables, including Byetta (exenatide), Bydureon (exenatide ER), Victoza (liraglutide), or Trulicity (dulaglutide).  Reviewed and Endorsed by [facility] Patient Education Committee, August 2015   DO NOT TAKE ANY ORAL DIABETIC MEDICATIONS DAY OF YOUR SURGERY                              You may not have any metal on your body including hair pins, jewelry, and body piercing             Do not wear make-up, lotions, powders, perfumes, or deodorant  Do not wear nail polish including gel and S&S, artificial/acrylic nails, or any other type of covering on natural nails including finger and toenails. If you have artificial nails, gel coating, etc. that needs to be removed by a nail salon please have this removed prior to surgery or surgery may need to be canceled/ delayed if the surgeon/ anesthesia feels like they are unable to be safely monitored.   Do not shave  48 hours prior to surgery.    Do not bring valuables to the hospital. [place].   Contacts, dentures or bridgework may not be worn into surgery.  Bring small overnight bag day of surgery.   DO NOT [employer]. PHARMACY WILL DISPENSE MEDICATIONS LISTED ON YOUR MEDICATION LIST TO YOU DURING YOUR ADMISSION [place]!              Please read over the following fact sheets you were given: IF YOU HAVE QUESTIONS ABOUT YOUR PRE-OP INSTRUCTIONS PLEASE CALL [facility] - Preparing for Surgery Before surgery, you can play an important role.  Because skin is not sterile, your skin needs to be as free of germs as possible.  You can reduce the number of germs on your skin by washing with CHG (chlorahexidine gluconate) soap before surgery.  CHG is an antiseptic cleaner which kills germs and bonds with the skin to continue killing germs even after washing. Please DO NOT use if you have an allergy to CHG or antibacterial soaps.  If your skin becomes reddened/irritated stop using the CHG and inform your nurse when you arrive at Short Stay. Do not shave (including legs and underarms) for at least 48 hours prior to the first CHG shower.  You may shave your face/neck.  Please follow these instructions carefully:  1.  Shower with CHG Soap the night before surgery and the  morning of surgery.  2.  If you choose to wash your hair, wash your hair first as usual with your normal  shampoo.  3.  After you shampoo, rinse your hair and body thoroughly to remove the shampoo.                             4.  Use CHG as you would any other liquid soap.  You can apply chg directly to the skin and wash.  Gently with a scrungie or clean washcloth.  5.  Apply the CHG Soap to your body ONLY FROM THE NECK DOWN.   Do   not use on face/ open                           Wound or open sores. Avoid contact with eyes, ears mouth and   genitals (private parts).                       Wash face,  Genitals (private parts) with your normal soap.             6.  Wash thoroughly, paying special attention to the area where your    surgery  will be performed.  7.  Thoroughly rinse your body with warm water from the neck down.  8.  DO NOT shower/wash with your normal soap after using and rinsing off the CHG Soap.                9.  Pat yourself dry with a clean towel.            10.  Wear clean pajamas.            11.  Place clean sheets on your bed the night of your first shower and do not  sleep with  pets. Day of Surgery : Do not apply any lotions/deodorants the morning of surgery.  Please wear clean clothes to the hospital/surgery center.  FAILURE TO FOLLOW THESE INSTRUCTIONS MAY RESULT IN THE CANCELLATION OF YOUR SURGERY  PATIENT SIGNATURE_________________________________  NURSE  SIGNATURE__________________________________  ________________________________________________________________________   Brenda Cochran  An incentive spirometer is a tool that can help keep your lungs clear and active. This tool measures how well you are filling your lungs with each breath. Taking long deep breaths may help reverse or decrease the chance of developing breathing (pulmonary) problems (especially infection) following: A long period of time when you are unable to move or be active. BEFORE THE PROCEDURE  If the spirometer includes an indicator to show your best effort, your nurse or respiratory therapist will set it to a desired goal. If possible, sit up straight or lean slightly forward. Try not to slouch. Hold the incentive spirometer in an upright position. INSTRUCTIONS FOR USE  Sit on the edge of your bed if possible, or sit up as far as you can in bed or on a chair. Hold the incentive spirometer in an upright position. Breathe out normally. Place the mouthpiece in your mouth and seal your lips tightly around it. Breathe in slowly and as deeply as possible, raising the piston or the ball toward the top of the column. Hold your breath for 3-5 seconds or for as long as possible. Allow the piston or ball to fall to the bottom of the column. Remove the mouthpiece from your mouth and breathe out normally. Rest for a few seconds and repeat Steps 1 through 7 at least 10 times every 1-2 hours when you are awake. Take your time and take a few normal breaths between deep breaths. The spirometer may include an indicator to show your best effort. Use the indicator as a goal to work toward during  each repetition. After each set of 10 deep breaths, practice coughing to be sure your lungs are clear. If you have an incision (the cut made at the time of surgery), support your incision when coughing by placing a pillow or rolled up towels firmly against it. Once you are able to get out of bed, walk around indoors and cough well. You may stop using the incentive spirometer when instructed by your caregiver.  RISKS AND COMPLICATIONS Take your time so you do not get dizzy or light-headed. If you are in pain, you may need to take or ask for pain medication before doing incentive spirometry. It is harder to take a deep breath if you are having pain. AFTER USE Rest and breathe slowly and easily. It can be helpful to keep track of a log of your progress. Your caregiver can provide you with a simple table to help with this. If you are using the spirometer at home, follow these instructions: [place] IF:  You are having difficultly using the spirometer. You have trouble using the spirometer as often as instructed. Your pain medication is not giving enough relief while using the spirometer. You develop fever of 100.5 F (38.1 C) or higher. SEEK IMMEDIATE MEDICAL CARE IF:  You cough up bloody sputum that had not been present before. You develop fever of 102 F (38.9 C) or greater. You develop worsening pain at or near the incision site. MAKE SURE YOU:  Understand these instructions. Will watch your condition. Will get help right away if you are not doing well or get worse. Document Released: 10/02/2006 Document Revised: 08/14/2011 Document Reviewed: 12/03/2006 [facility] Patient Information 2014 [place], [place].   ________________________________________________________________________

## 2022-02-17 NOTE — Progress Notes (Addendum)
COVID Vaccine Completed: yes x2  Date of COVID positive in last 90 days:  PCP - Marcie Mowers, NP Cardiologist - Jyl Heinz, MD  Cardiac clearance by Jyl Heinz 01/31/22 Epic  Chest x-ray - 09/13/21 Epic EKG - 01/31/22 Epic Stress Test - 12/29/20 Epic ECHO - 09/18/19 Epic  Cardiac Cath - 09/16/21 Epic Pacemaker/ICD device last checked: Spinal Cord Stimulator:  Bowel Prep -   Sleep Study -  CPAP -   Fasting Blood Sugar -  Checks Blood Sugar _____ times a day  Blood Thinner Instructions: Aspirin Instructions: ASA 81 Last Dose:  Activity level:  Can go up a flight of stairs and perform activities of daily living without stopping and without symptoms of chest pain or shortness of breath.  Able to exercise without symptoms  Unable to go up a flight of stairs without symptoms of     Anesthesia review: TIA, NSTEMI, HTN, OSA, COPD, DM2, CAD  Patient denies shortness of breath, fever, cough and chest pain at PAT appointment  Patient verbalized understanding of instructions that were given to them at the PAT appointment. Patient was also instructed that they will need to review over the PAT instructions again at home before surgery.

## 2022-02-20 ENCOUNTER — Encounter (HOSPITAL_COMMUNITY)
Admission: RE | Admit: 2022-02-20 | Discharge: 2022-02-20 | Disposition: A | Discharge status: Home / Self Care | Payer: PPO | Source: Ambulatory Visit | Attending: Orthopedic Surgery | Admitting: Orthopedic Surgery

## 2022-02-20 ENCOUNTER — Encounter (HOSPITAL_COMMUNITY): Payer: Self-pay

## 2022-02-20 VITALS — BP 149/81 | HR 80 | Temp 98.2°F | Resp 16 | Ht 65.0 in | Wt 172.0 lb

## 2022-02-20 DIAGNOSIS — M1711 Unilateral primary osteoarthritis, right knee: Secondary | ICD-10-CM | POA: Insufficient documentation

## 2022-02-20 DIAGNOSIS — I2510 Atherosclerotic heart disease of native coronary artery without angina pectoris: Secondary | ICD-10-CM | POA: Insufficient documentation

## 2022-02-20 DIAGNOSIS — Z87891 Personal history of nicotine dependence: Secondary | ICD-10-CM | POA: Insufficient documentation

## 2022-02-20 DIAGNOSIS — M069 Rheumatoid arthritis, unspecified: Secondary | ICD-10-CM | POA: Insufficient documentation

## 2022-02-20 DIAGNOSIS — I119 Hypertensive heart disease without heart failure: Secondary | ICD-10-CM | POA: Diagnosis not present

## 2022-02-20 DIAGNOSIS — E1121 Type 2 diabetes mellitus with diabetic nephropathy: Secondary | ICD-10-CM | POA: Diagnosis not present

## 2022-02-20 DIAGNOSIS — Z01812 Encounter for preprocedural laboratory examination: Secondary | ICD-10-CM | POA: Insufficient documentation

## 2022-02-20 DIAGNOSIS — J449 Chronic obstructive pulmonary disease, unspecified: Secondary | ICD-10-CM | POA: Insufficient documentation

## 2022-02-20 DIAGNOSIS — G4733 Obstructive sleep apnea (adult) (pediatric): Secondary | ICD-10-CM | POA: Insufficient documentation

## 2022-02-20 DIAGNOSIS — Z01818 Encounter for other preprocedural examination: Secondary | ICD-10-CM

## 2022-02-20 HISTORY — DX: Cardiac arrhythmia, unspecified: I49.9

## 2022-02-20 HISTORY — DX: Anemia, unspecified: D64.9

## 2022-02-20 LAB — BASIC METABOLIC PANEL
Anion gap: 6 (ref 5–15)
BUN: 5 mg/dL — ABNORMAL LOW (ref 8–23)
CO2: 26 mmol/L (ref 22–32)
Calcium: 9.4 mg/dL (ref 8.9–10.3)
Chloride: 108 mmol/L (ref 98–111)
Creatinine, Ser: 0.52 mg/dL (ref 0.44–1.00)
GFR, Estimated: >60 mL/min (ref >60)
Glucose, Bld: 121 mg/dL — ABNORMAL HIGH (ref 70–99)
Potassium: 4.4 mmol/L (ref 3.5–5.1)
Sodium: 140 mmol/L (ref 135–145)

## 2022-02-20 LAB — CBC
HCT: 49.4 % — ABNORMAL HIGH (ref 36.0–46.0)
Hemoglobin: 16 g/dL — ABNORMAL HIGH (ref 12.0–15.0)
MCH: 29.7 pg (ref 26.0–34.0)
MCHC: 32.4 g/dL (ref 30.0–36.0)
MCV: 91.7 fL (ref 80.0–100.0)
Platelets: 246 10*3/uL (ref 150–400)
RBC: 5.39 MIL/uL — ABNORMAL HIGH (ref 3.87–5.11)
RDW: 12.5 % (ref 11.5–15.5)
WBC: 6.4 10*3/uL (ref 4.0–10.5)
nRBC: 0 % (ref 0.0–0.2)

## 2022-02-20 LAB — GLUCOSE, CAPILLARY: Glucose-Capillary: 120 mg/dL — ABNORMAL HIGH (ref 70–99)

## 2022-02-20 LAB — HEMOGLOBIN A1C
Hgb A1c MFr Bld: 6.3 % — ABNORMAL HIGH (ref 4.8–5.6)
Mean Plasma Glucose: 134.11 mg/dL

## 2022-02-20 LAB — SURGICAL PCR SCREEN
MRSA, PCR: NEGATIVE
Staphylococcus aureus: NEGATIVE

## 2022-02-21 NOTE — Anesthesia Preprocedure Evaluation (Addendum)
Anesthesia Evaluation  Patient identified by MRN, date of birth, ID band Patient awake    Reviewed: Allergy & Precautions, NPO status , Patient's Chart, lab work & pertinent test results  Airway Mallampati: I  TM Distance: >3 FB Neck ROM: Full    Dental  (+) Edentulous Upper, Edentulous Lower   Pulmonary asthma , sleep apnea , COPD,  COPD inhaler, former smoker,    breath sounds clear to auscultation       Cardiovascular hypertension, + CAD and + Cardiac Stents  + dysrhythmias  Rhythm:Regular Rate:Normal  Echo (2021): 1. Left ventricular ejection fraction by 3D volume is 53 %. The left  ventricle has normal function. The left ventricle has no regional wall  motion abnormalities. Left ventricular diastolic parameters are consistent  with Grade I diastolic dysfunction  (impaired relaxation). The average left ventricular global longitudinal  strain is -20.2 % (normal).  2. Right ventricular systolic function is normal. The right ventricular  size is normal. Tricuspid regurgitation signal is inadequate for assessing  PA pressure.  3. The mitral valve is normal in structure. No evidence of mitral valve  regurgitation. No evidence of mitral stenosis.  4. The aortic valve is normal in structure. Aortic valve regurgitation is  not visualized. No aortic stenosis is present.  5. The inferior vena cava is normal in size with greater than 50%  respiratory variability, suggesting right atrial pressure of 3 mmHg.   Heart Cath (09/2021): CONCLUSIONS: ? Patent left main ? Progression of ostial LAD from 40% to 80%.  Physiology was negative for ischemia. ? Dominant circumflex.  First obtuse marginal ostial 80% stenosis. ? Nondominant right coronary ? Normal LV hemodynamics with low normal EF of 50%.     Neuro/Psych  Headaches, PSYCHIATRIC DISORDERS Anxiety Depression TIA Neuromuscular disease    GI/Hepatic Neg liver ROS, GERD  ,   Endo/Other  diabetes, Type 2, Oral Hypoglycemic AgentsHypothyroidism   Renal/GU      Musculoskeletal  (+) Arthritis , Fibromyalgia -  Abdominal Normal abdominal exam  (+)   Peds  Hematology   Anesthesia Other Findings   Reproductive/Obstetrics                           Anesthesia Physical Anesthesia Plan  ASA: 3  Anesthesia Plan: Spinal   Post-op Pain Management: Regional block*   Induction: Intravenous  PONV Risk Score and Plan: 3 and Ondansetron, Dexamethasone and Propofol infusion  Airway Management Planned: Natural Airway and Simple Face Mask  Additional Equipment: None  Intra-op Plan:   Post-operative Plan:   Informed Consent: I have reviewed the patients History and Physical, chart, labs and discussed the procedure including the risks, benefits and alternatives for the proposed anesthesia with the patient or authorized representative who has indicated his/her understanding and acceptance.       Plan Discussed with: CRNA  Anesthesia Plan Comments: (See PAT note 02/20/2022  Lab Results      Component                Value               Date                      WBC                      6.4  02/20/2022                HGB                      16.0 (H)            02/20/2022                HCT                      49.4 (H)            02/20/2022                MCV                      91.7                02/20/2022                PLT                      246                 02/20/2022           )      Anesthesia Quick Evaluation

## 2022-02-21 NOTE — Progress Notes (Signed)
Anesthesia Chart Review   Case: [id number] Date/Time: 03/01/22 1015   Procedure: COMPUTER ASSISTED TOTAL KNEE ARTHROPLASTY (Right: Knee) - 150   Anesthesia type: Spinal   Pre-op diagnosis: Right knee osteoarthritis   Location: [facility] ROOM 07 / [facility]   Surgeons: Rod Can, MD       DISCUSSION:71 y.o. former smoker with h/o HTN, COPD, RA, DM II, CAD, OSA, right knee OA scheduled for above procedure 03/01/2022 with Dr. Rod Can.   Pt last seen by cardiology 01/31/2022. Per OV note, "Preoperative cardiovascular risk assessment: In view of the below mentioned evaluation which includes coronary angiography and PET stress test results which did not reveal any evidence of ischemia, she is not at high risk for coronary events during the aforementioned surgery.  She remains at moderate risk because of comorbidities and her existing coronary artery disease.  I discussed this with her at length.  Meticulous hemodynamic monitoring will further reduce the risk of coronary events."  Anticipate pt can proceed with planned procedure barring acute status change.   VS: BP (!) 149/81   Pulse 80   Temp 36.8 C (Oral)   Resp 16   Ht '5\' 5"'$  (1.651 m)   Wt 78 kg   SpO2 99%   BMI 28.62 kg/m   PROVIDERS: Rhea Bleacher, NP is PCP   Cardiologist - Jyl Heinz, MD LABS: Labs reviewed: Acceptable for surgery. (all labs ordered are listed, but only abnormal results are displayed)  Labs Reviewed  HEMOGLOBIN A1C - Abnormal; Notable for the following components:      Result Value   Hgb A1c MFr Bld 6.3 (*)    All other components within normal limits  BASIC METABOLIC PANEL - Abnormal; Notable for the following components:   Glucose, Bld 121 (*)    BUN 5 (*)    All other components within normal limits  CBC - Abnormal; Notable for the following components:   RBC 5.39 (*)    Hemoglobin 16.0 (*)    HCT 49.4 (*)    All other components within normal limits  GLUCOSE, CAPILLARY - Abnormal; Notable  for the following components:   Glucose-Capillary 120 (*)    All other components within normal limits  SURGICAL PCR SCREEN     IMAGES:   EKG:   CV: Myocardial Perfusion 12/29/2020 The left ventricular ejection fraction is normal (55-65%). Nuclear stress EF: 59%. There was no ST segment deviation noted during stress. Defect 1: There is a small defect of mild severity present in the apical anterior location. No ischemia or scar noted. Fixed defect involving apical portion of the anterior wall most likely representing breast attenuation  Echo 09/18/2019 1. Left ventricular ejection fraction by 3D volume is 53 %. The left  ventricle has normal function. The left ventricle has no regional wall  motion abnormalities. Left ventricular diastolic parameters are consistent  with Grade I diastolic dysfunction  (impaired relaxation). The average left ventricular global longitudinal  strain is -20.2 % (normal).   2. Right ventricular systolic function is normal. The right ventricular  size is normal. Tricuspid regurgitation signal is inadequate for assessing  PA pressure.   3. The mitral valve is normal in structure. No evidence of mitral valve  regurgitation. No evidence of mitral stenosis.   4. The aortic valve is normal in structure. Aortic valve regurgitation is  not visualized. No aortic stenosis is present.   5. The inferior vena cava is normal in size with greater than 50%  respiratory variability, suggesting right atrial pressure of 3 mmHg.  Past Medical History:  Diagnosis Date   Anemia    years ago per pt   Angina pectoris ([place]) 01/10/2019   Anxiety    Asthma    Asthma, chronic obstructive, with acute exacerbation ([place])    [name] 01/13/2014  FEV1 76% predicted FVC 77% predicted FEV1 to FVC ratio 100% predicted FEF 25 7569% predicted CXR 01/13/14: NAD Alpha one antitrypsin 156  IgE 56 sl elevated.  RAST neg     Atherosclerotic heart disease of native coronary artery without  angina pectoris    CAD (coronary artery disease) 09/23/2018   Chronic fatigue syndrome 12/22/2020   Chronic maxillary sinusitis    Chronic tension-type headache, intractable 08/07/2019   Cigarette nicotine dependence with nicotine-induced disorder 08/18/2019   Continuous dependence on cigarette smoking 06/09/2019   COPD (chronic obstructive pulmonary disease) (HCC)    COPD mixed type ([place]) 11/09/2020   COPD with acute exacerbation ([place]) 11/10/2020   Coronary artery disease involving native coronary artery of native heart without angina pectoris 07/25/2018   Cough 10/29/2019   Depression    Dyslipidemia associated with type 2 diabetes mellitus ([place]) 08/23/2018   Dysrhythmia    Essential hypertension 07/18/2018   Family history of colonic polyps    Fibromyalgia    GERD (gastroesophageal reflux disease)    Headache    History of colon polyps    Hyperlipidemia    Hypertensive heart disease without heart failure    Hypothyroidism (acquired)    Loculated pleural effusion 12/30/2019   Major depressive disorder, single episode, mild ([place])    Menopausal and female climacteric states    Mixed hyperlipidemia    Nicotine dependence, cigarettes, with unspecified nicotine-induced disorders    NSTEMI (non-ST elevated myocardial infarction) ([place]) 07/12/2018   Numbness and tingling in left upper extremity    Obesity 12/22/2020   OSA (obstructive sleep apnea) 01/13/2014   Sleep study 02/19/14 : AHI 5.5.  Mild sleep apnea.  Lowest SpO2 81%    Overweight    Pain in right knee 07/04/2017   Palpitations 08/15/2019   Pleural effusion, not elsewhere classified 12/30/2019   Pneumonia due to COVID-19 virus 01/06/2020   Preoperative cardiovascular examination 08/30/2021   Primary osteoarthritis    RA (rheumatoid arthritis) ([place])    [name]   Shortness of breath 10/29/2019   Skin cancer    face   Sleep apnea    cpap   TIA (transient ischemic attack) 09/14/2016   Type 2 diabetes mellitus  ([place])    Type 2 diabetes mellitus with diabetic nephropathy ([place])    Vitamin D deficiency, unspecified     Past Surgical History:  Procedure Laterality Date   ABDOMINAL HYSTERECTOMY  06/05/1982   BREAST LUMPECTOMY  06/05/1974   left, benign   CARPAL TUNNEL RELEASE Right 1998   CHOLECYSTECTOMY  09/05/2010   COLONOSCOPY  02/07/2013   Colon polyp-status post polypectomy. Small internal hemorrhoids. Otherwise grossly normal colonoscopy   CORONARY STENT INTERVENTION N/A 07/12/2018   Procedure: CORONARY STENT INTERVENTION;  Surgeon: Jettie Booze, MD;  Location: [place] [facility];  Service: Cardiovascular;  Laterality: N/A;   CORONARY THROMBECTOMY N/A 07/12/2018   Procedure: Coronary Thrombectomy;  Surgeon: Jettie Booze, MD;  Location: [place] [facility];  Service: Cardiovascular;  Laterality: N/A;   CYST EXCISION Left 1980   foot   ESOPHAGOGASTRODUODENOSCOPY  03/26/2017   Moderate gastritis. Otherwise normal EGD   HEEL SPUR SURGERY  foot for spurs   INTRAVASCULAR PRESSURE WIRE/FFR STUDY N/A 01/21/2019   Procedure: INTRAVASCULAR PRESSURE WIRE/FFR STUDY;  Surgeon: Jettie Booze, MD;  Location: [place] [facility];  Service: Cardiovascular;  Laterality: N/A;   INTRAVASCULAR PRESSURE WIRE/FFR STUDY N/A 09/16/2021   Procedure: INTRAVASCULAR PRESSURE WIRE/FFR STUDY;  Surgeon: Belva Crome, MD;  Location: [place] [facility];  Service: Cardiovascular;  Laterality: N/A;   LEFT HEART CATH AND CORONARY ANGIOGRAPHY N/A 07/12/2018   Procedure: LEFT HEART CATH AND CORONARY ANGIOGRAPHY;  Surgeon: Jettie Booze, MD;  Location: [place];  Service: Cardiovascular;  Laterality: N/A;   LEFT HEART CATH AND CORONARY ANGIOGRAPHY N/A 01/21/2019   Procedure: LEFT HEART CATH AND CORONARY ANGIOGRAPHY;  Surgeon: Jettie Booze, MD;  Location: [place] [facility];  Service: Cardiovascular;  Laterality: N/A;   LEFT HEART CATH AND CORONARY ANGIOGRAPHY N/A 09/16/2021    Procedure: LEFT HEART CATH AND CORONARY ANGIOGRAPHY;  Surgeon: Belva Crome, MD;  Location: [place] [facility];  Service: Cardiovascular;  Laterality: N/A;   TONSILLECTOMY     adenoids    MEDICATIONS:  albuterol (PROVENTIL) (2.5 MG/3ML) 0.083% nebulizer solution   albuterol (VENTOLIN HFA) 108 (90 Base) MCG/ACT inhaler   aspirin EC 81 MG tablet   Budeson-Glycopyrrol-Formoterol (BREZTRI AEROSPHERE) 160-9-4.8 MCG/ACT AERO   cyanocobalamin (,VITAMIN B-12,) 1000 MCG/ML injection   ezetimibe (ZETIA) 10 MG tablet   fluticasone (FLONASE) 50 MCG/ACT nasal spray   JARDIANCE 25 MG TABS tablet   leflunomide (ARAVA) 20 MG tablet   nitroGLYCERIN (NITROSTAT) 0.4 MG SL tablet   nystatin (MYCOSTATIN/NYSTOP) powder   rosuvastatin (CRESTOR) 40 MG tablet   TRULICITY 3 BW/3.8LH SOPN   No current facility-administered medications for this encounter.      Konrad Felix Ward, PA-C [facility] [phone]

## 2022-02-22 ENCOUNTER — Ambulatory Visit: Payer: Self-pay | Admitting: Student

## 2022-03-01 ENCOUNTER — Ambulatory Visit (HOSPITAL_BASED_OUTPATIENT_CLINIC_OR_DEPARTMENT_OTHER): Payer: PPO | Admitting: Anesthesiology

## 2022-03-01 ENCOUNTER — Other Ambulatory Visit: Payer: Self-pay

## 2022-03-01 ENCOUNTER — Ambulatory Visit (HOSPITAL_COMMUNITY): Payer: PPO | Admitting: Physician Assistant

## 2022-03-01 ENCOUNTER — Observation Stay (HOSPITAL_COMMUNITY)
Admission: RE | Admit: 2022-03-01 | Discharge: 2022-03-02 | Disposition: A | Discharge status: Home / Self Care | Payer: PPO | Source: Ambulatory Visit | Attending: Orthopedic Surgery | Admitting: Orthopedic Surgery

## 2022-03-01 ENCOUNTER — Encounter (HOSPITAL_COMMUNITY)
Admission: RE | Disposition: A | Discharge status: Home / Self Care | Payer: Self-pay | Source: Ambulatory Visit | Attending: Orthopedic Surgery

## 2022-03-01 ENCOUNTER — Observation Stay (HOSPITAL_COMMUNITY): Payer: PPO

## 2022-03-01 ENCOUNTER — Encounter (HOSPITAL_COMMUNITY): Payer: Self-pay | Admitting: Orthopedic Surgery

## 2022-03-01 DIAGNOSIS — M1711 Unilateral primary osteoarthritis, right knee: Principal | ICD-10-CM | POA: Diagnosis present

## 2022-03-01 DIAGNOSIS — F418 Other specified anxiety disorders: Secondary | ICD-10-CM | POA: Diagnosis not present

## 2022-03-01 DIAGNOSIS — E039 Hypothyroidism, unspecified: Secondary | ICD-10-CM | POA: Insufficient documentation

## 2022-03-01 DIAGNOSIS — Z85828 Personal history of other malignant neoplasm of skin: Secondary | ICD-10-CM | POA: Diagnosis not present

## 2022-03-01 DIAGNOSIS — Z7984 Long term (current) use of oral hypoglycemic drugs: Secondary | ICD-10-CM | POA: Insufficient documentation

## 2022-03-01 DIAGNOSIS — J449 Chronic obstructive pulmonary disease, unspecified: Secondary | ICD-10-CM

## 2022-03-01 DIAGNOSIS — Z7982 Long term (current) use of aspirin: Secondary | ICD-10-CM | POA: Insufficient documentation

## 2022-03-01 DIAGNOSIS — J45909 Unspecified asthma, uncomplicated: Secondary | ICD-10-CM | POA: Insufficient documentation

## 2022-03-01 DIAGNOSIS — Z87891 Personal history of nicotine dependence: Secondary | ICD-10-CM

## 2022-03-01 DIAGNOSIS — G4730 Sleep apnea, unspecified: Secondary | ICD-10-CM

## 2022-03-01 DIAGNOSIS — Z7985 Long-term (current) use of injectable non-insulin antidiabetic drugs: Secondary | ICD-10-CM | POA: Insufficient documentation

## 2022-03-01 DIAGNOSIS — I2510 Atherosclerotic heart disease of native coronary artery without angina pectoris: Secondary | ICD-10-CM | POA: Insufficient documentation

## 2022-03-01 DIAGNOSIS — E1169 Type 2 diabetes mellitus with other specified complication: Secondary | ICD-10-CM | POA: Insufficient documentation

## 2022-03-01 DIAGNOSIS — Z8673 Personal history of transient ischemic attack (TIA), and cerebral infarction without residual deficits: Secondary | ICD-10-CM | POA: Diagnosis not present

## 2022-03-01 DIAGNOSIS — Z955 Presence of coronary angioplasty implant and graft: Secondary | ICD-10-CM | POA: Insufficient documentation

## 2022-03-01 DIAGNOSIS — Z96651 Presence of right artificial knee joint: Secondary | ICD-10-CM

## 2022-03-01 DIAGNOSIS — E785 Hyperlipidemia, unspecified: Secondary | ICD-10-CM | POA: Diagnosis not present

## 2022-03-01 DIAGNOSIS — E1121 Type 2 diabetes mellitus with diabetic nephropathy: Secondary | ICD-10-CM | POA: Insufficient documentation

## 2022-03-01 DIAGNOSIS — I119 Hypertensive heart disease without heart failure: Secondary | ICD-10-CM | POA: Diagnosis not present

## 2022-03-01 DIAGNOSIS — Z79899 Other long term (current) drug therapy: Secondary | ICD-10-CM | POA: Insufficient documentation

## 2022-03-01 DIAGNOSIS — Z8616 Personal history of COVID-19: Secondary | ICD-10-CM | POA: Insufficient documentation

## 2022-03-01 HISTORY — DX: Unilateral primary osteoarthritis, right knee: M17.11

## 2022-03-01 HISTORY — PX: KNEE ARTHROPLASTY: SHX992

## 2022-03-01 LAB — GLUCOSE, CAPILLARY
Glucose-Capillary: 134 mg/dL — ABNORMAL HIGH (ref 70–99)
Glucose-Capillary: 163 mg/dL — ABNORMAL HIGH (ref 70–99)
Glucose-Capillary: 167 mg/dL — ABNORMAL HIGH (ref 70–99)
Glucose-Capillary: 316 mg/dL — ABNORMAL HIGH (ref 70–99)

## 2022-03-01 SURGERY — ARTHROPLASTY, KNEE, TOTAL, USING IMAGELESS COMPUTER-ASSISTED NAVIGATION
Anesthesia: Spinal | Site: Knee | Laterality: Right

## 2022-03-01 MED ORDER — EMPAGLIFLOZIN 25 MG PO TABS
25.0000 mg | ORAL_TABLET | Freq: Every day | ORAL | Status: DC
  Administered 2022-03-02: 25 mg via ORAL
  Filled 2022-03-01: qty 1

## 2022-03-01 MED ORDER — CEFAZOLIN SODIUM-DEXTROSE 2-4 GM/100ML-% IV SOLN
2.0000 g | INTRAVENOUS | Status: AC
  Administered 2022-03-01: 2 g via INTRAVENOUS
  Filled 2022-03-01: qty 100

## 2022-03-01 MED ORDER — METHOCARBAMOL 500 MG PO TABS
500.0000 mg | ORAL_TABLET | Freq: Four times a day (QID) | ORAL | Status: DC | PRN
  Administered 2022-03-01 – 2022-03-02 (×2): 500 mg via ORAL
  Filled 2022-03-01 (×2): qty 1

## 2022-03-01 MED ORDER — NITROGLYCERIN 0.4 MG SL SUBL: 0.4000 mg | SUBLINGUAL_TABLET | SUBLINGUAL | Status: DC | PRN

## 2022-03-01 MED ORDER — ACETAMINOPHEN 325 MG PO TABS: 325.0000 mg | ORAL_TABLET | Freq: Four times a day (QID) | ORAL | Status: DC | PRN

## 2022-03-01 MED ORDER — MUPIROCIN 2 % EX OINT
TOPICAL_OINTMENT | CUTANEOUS | Status: AC
  Administered 2022-03-01: 1 via TOPICAL
  Filled 2022-03-01: qty 22

## 2022-03-01 MED ORDER — MOMETASONE FURO-FORMOTEROL FUM 100-5 MCG/ACT IN AERO
2.0000 | INHALATION_SPRAY | Freq: Two times a day (BID) | RESPIRATORY_TRACT | Status: DC
  Administered 2022-03-01 – 2022-03-02 (×2): 2 via RESPIRATORY_TRACT
  Filled 2022-03-01: qty 8.8

## 2022-03-01 MED ORDER — PHENOL 1.4 % MT LIQD: 1.0000 | OROMUCOSAL | Status: DC | PRN

## 2022-03-01 MED ORDER — ACETAMINOPHEN 325 MG PO TABS: 325.0000 mg | ORAL_TABLET | Freq: Once | ORAL | Status: DC | PRN

## 2022-03-01 MED ORDER — LACTATED RINGERS IV SOLN: INTRAVENOUS | Status: DC

## 2022-03-01 MED ORDER — ALUM & MAG HYDROXIDE-SIMETH 200-200-20 MG/5ML PO SUSP: 30.0000 mL | ORAL | Status: DC | PRN

## 2022-03-01 MED ORDER — DOCUSATE SODIUM 100 MG PO CAPS
100.0000 mg | ORAL_CAPSULE | Freq: Two times a day (BID) | ORAL | Status: DC
  Administered 2022-03-01 – 2022-03-02 (×2): 100 mg via ORAL
  Filled 2022-03-01 (×2): qty 1

## 2022-03-01 MED ORDER — ASPIRIN 81 MG PO CHEW
81.0000 mg | CHEWABLE_TABLET | Freq: Two times a day (BID) | ORAL | Status: DC
  Administered 2022-03-02: 81 mg via ORAL
  Filled 2022-03-01: qty 1

## 2022-03-01 MED ORDER — METHOCARBAMOL 500 MG IVPB - SIMPLE MED: 500.0000 mg | Freq: Four times a day (QID) | INTRAVENOUS | Status: DC | PRN

## 2022-03-01 MED ORDER — ROPIVACAINE HCL 5 MG/ML IJ SOLN
INTRAMUSCULAR | Status: DC | PRN
  Administered 2022-03-01: 30 mL via PERINEURAL

## 2022-03-01 MED ORDER — ISOPROPYL ALCOHOL 70 % SOLN
Status: DC | PRN
  Administered 2022-03-01: 1 via TOPICAL

## 2022-03-01 MED ORDER — INSULIN ASPART 100 UNIT/ML IJ SOLN
0.0000 [IU] | Freq: Three times a day (TID) | INTRAMUSCULAR | Status: DC
  Administered 2022-03-01: 2 [IU] via SUBCUTANEOUS
  Administered 2022-03-02: 5 [IU] via SUBCUTANEOUS

## 2022-03-01 MED ORDER — KETOROLAC TROMETHAMINE 30 MG/ML IJ SOLN
INTRAMUSCULAR | Status: AC
  Filled 2022-03-01: qty 1

## 2022-03-01 MED ORDER — ONDANSETRON HCL 4 MG/2ML IJ SOLN
INTRAMUSCULAR | Status: DC | PRN
  Administered 2022-03-01: 4 mg via INTRAVENOUS

## 2022-03-01 MED ORDER — ALBUTEROL SULFATE (2.5 MG/3ML) 0.083% IN NEBU: 2.5000 mg | INHALATION_SOLUTION | Freq: Four times a day (QID) | RESPIRATORY_TRACT | Status: DC | PRN

## 2022-03-01 MED ORDER — SODIUM CHLORIDE (PF) 0.9 % IJ SOLN
INTRAMUSCULAR | Status: AC
  Filled 2022-03-01: qty 50

## 2022-03-01 MED ORDER — HYDROMORPHONE HCL 1 MG/ML IJ SOLN
INTRAMUSCULAR | Status: AC
  Administered 2022-03-01: 0.5 mg via INTRAVENOUS
  Filled 2022-03-01: qty 2

## 2022-03-01 MED ORDER — ACETAMINOPHEN 500 MG PO TABS
1000.0000 mg | ORAL_TABLET | Freq: Once | ORAL | Status: DC
  Filled 2022-03-01: qty 2

## 2022-03-01 MED ORDER — EZETIMIBE 10 MG PO TABS
10.0000 mg | ORAL_TABLET | Freq: Every day | ORAL | Status: DC
  Administered 2022-03-01: 10 mg via ORAL
  Filled 2022-03-01: qty 1

## 2022-03-01 MED ORDER — POLYETHYLENE GLYCOL 3350 17 G PO PACK: 17.0000 g | PACK | Freq: Every day | ORAL | Status: DC | PRN

## 2022-03-01 MED ORDER — PROMETHAZINE HCL 25 MG/ML IJ SOLN: 6.2500 mg | INTRAMUSCULAR | Status: DC | PRN

## 2022-03-01 MED ORDER — MENTHOL 3 MG MT LOZG: 1.0000 | LOZENGE | OROMUCOSAL | Status: DC | PRN

## 2022-03-01 MED ORDER — CEFAZOLIN SODIUM-DEXTROSE 2-4 GM/100ML-% IV SOLN
2.0000 g | Freq: Four times a day (QID) | INTRAVENOUS | Status: AC
  Administered 2022-03-01 (×2): 2 g via INTRAVENOUS
  Filled 2022-03-01 (×2): qty 100

## 2022-03-01 MED ORDER — BUPIVACAINE-EPINEPHRINE 0.25% -1:200000 IJ SOLN
INTRAMUSCULAR | Status: DC | PRN
  Administered 2022-03-01: 30 mL

## 2022-03-01 MED ORDER — ALBUTEROL SULFATE HFA 108 (90 BASE) MCG/ACT IN AERS: 1.0000 | INHALATION_SPRAY | Freq: Four times a day (QID) | RESPIRATORY_TRACT | Status: DC | PRN

## 2022-03-01 MED ORDER — MIDAZOLAM HCL 2 MG/2ML IJ SOLN
INTRAMUSCULAR | Status: AC
  Filled 2022-03-01: qty 2

## 2022-03-01 MED ORDER — HYDROMORPHONE HCL 1 MG/ML IJ SOLN: 0.5000 mg | INTRAMUSCULAR | Status: DC | PRN

## 2022-03-01 MED ORDER — POVIDONE-IODINE 10 % EX SWAB: 2.0000 | Freq: Once | CUTANEOUS | Status: DC

## 2022-03-01 MED ORDER — PHENYLEPHRINE 80 MCG/ML (10ML) SYRINGE FOR IV PUSH (FOR BLOOD PRESSURE SUPPORT)
PREFILLED_SYRINGE | INTRAVENOUS | Status: DC | PRN
  Administered 2022-03-01: 160 ug via INTRAVENOUS

## 2022-03-01 MED ORDER — MIDAZOLAM HCL 2 MG/2ML IJ SOLN: 1.0000 mg | INTRAMUSCULAR | Status: DC

## 2022-03-01 MED ORDER — SODIUM CHLORIDE 0.9 % IV SOLN: INTRAVENOUS | Status: DC

## 2022-03-01 MED ORDER — INSULIN ASPART 100 UNIT/ML IJ SOLN
0.0000 [IU] | Freq: Every day | INTRAMUSCULAR | Status: DC
  Administered 2022-03-01: 4 [IU] via SUBCUTANEOUS

## 2022-03-01 MED ORDER — BUPIVACAINE IN DEXTROSE 0.75-8.25 % IT SOLN
INTRATHECAL | Status: DC | PRN
  Administered 2022-03-01: 1.7 mL via INTRATHECAL

## 2022-03-01 MED ORDER — METOCLOPRAMIDE HCL 5 MG PO TABS: 5.0000 mg | ORAL_TABLET | Freq: Three times a day (TID) | ORAL | Status: DC | PRN

## 2022-03-01 MED ORDER — TRANEXAMIC ACID-NACL 1000-0.7 MG/100ML-% IV SOLN
1000.0000 mg | INTRAVENOUS | Status: AC
  Administered 2022-03-01: 1000 mg via INTRAVENOUS
  Filled 2022-03-01: qty 100

## 2022-03-01 MED ORDER — BUPIVACAINE-EPINEPHRINE (PF) 0.25% -1:200000 IJ SOLN
INTRAMUSCULAR | Status: AC
  Filled 2022-03-01: qty 30

## 2022-03-01 MED ORDER — UMECLIDINIUM BROMIDE 62.5 MCG/ACT IN AEPB
1.0000 | INHALATION_SPRAY | Freq: Every day | RESPIRATORY_TRACT | Status: DC
  Filled 2022-03-01: qty 7

## 2022-03-01 MED ORDER — FENTANYL CITRATE PF 50 MCG/ML IJ SOSY
PREFILLED_SYRINGE | INTRAMUSCULAR | Status: AC
  Administered 2022-03-01: 25 ug via INTRAVENOUS
  Filled 2022-03-01: qty 1

## 2022-03-01 MED ORDER — SODIUM CHLORIDE 0.9 % IR SOLN
Status: DC | PRN
  Administered 2022-03-01 (×2): 1000 mL

## 2022-03-01 MED ORDER — MEPERIDINE HCL 50 MG/ML IJ SOLN: 6.2500 mg | INTRAMUSCULAR | Status: DC | PRN

## 2022-03-01 MED ORDER — ROSUVASTATIN CALCIUM 20 MG PO TABS
40.0000 mg | ORAL_TABLET | Freq: Every day | ORAL | Status: DC
  Administered 2022-03-01 – 2022-03-02 (×2): 40 mg via ORAL
  Filled 2022-03-01 (×2): qty 2

## 2022-03-01 MED ORDER — BUDESON-GLYCOPYRROL-FORMOTEROL 160-9-4.8 MCG/ACT IN AERO: 2.0000 | INHALATION_SPRAY | Freq: Two times a day (BID) | RESPIRATORY_TRACT | Status: DC

## 2022-03-01 MED ORDER — PROPOFOL 500 MG/50ML IV EMUL
INTRAVENOUS | Status: DC | PRN
  Administered 2022-03-01: 100 ug/kg/min via INTRAVENOUS

## 2022-03-01 MED ORDER — DEXAMETHASONE SODIUM PHOSPHATE 10 MG/ML IJ SOLN
INTRAMUSCULAR | Status: DC | PRN
  Administered 2022-03-01: 10 mg via INTRAVENOUS

## 2022-03-01 MED ORDER — HYDROMORPHONE HCL 2 MG PO TABS
1.0000 mg | ORAL_TABLET | ORAL | Status: DC | PRN
  Administered 2022-03-01 – 2022-03-02 (×4): 2 mg via ORAL
  Filled 2022-03-01 (×3): qty 1

## 2022-03-01 MED ORDER — DIPHENHYDRAMINE HCL 12.5 MG/5ML PO ELIX: 12.5000 mg | ORAL_SOLUTION | ORAL | Status: DC | PRN

## 2022-03-01 MED ORDER — HYDROMORPHONE HCL 1 MG/ML IJ SOLN
0.2500 mg | INTRAMUSCULAR | Status: DC | PRN
  Administered 2022-03-01 (×2): 0.5 mg via INTRAVENOUS

## 2022-03-01 MED ORDER — KETOROLAC TROMETHAMINE 30 MG/ML IJ SOLN
INTRAMUSCULAR | Status: DC | PRN
  Administered 2022-03-01: 30 mg

## 2022-03-01 MED ORDER — STERILE WATER FOR IRRIGATION IR SOLN
Status: DC | PRN
  Administered 2022-03-01: 2000 mL

## 2022-03-01 MED ORDER — FLUTICASONE PROPIONATE 50 MCG/ACT NA SUSP: 1.0000 | Freq: Every day | NASAL | Status: DC | PRN

## 2022-03-01 MED ORDER — METOCLOPRAMIDE HCL 5 MG/ML IJ SOLN: 5.0000 mg | Freq: Three times a day (TID) | INTRAMUSCULAR | Status: DC | PRN

## 2022-03-01 MED ORDER — ONDANSETRON HCL 4 MG PO TABS: 4.0000 mg | ORAL_TABLET | Freq: Four times a day (QID) | ORAL | Status: DC | PRN

## 2022-03-01 MED ORDER — CHLORHEXIDINE GLUCONATE 0.12 % MT SOLN
15.0000 mL | Freq: Once | OROMUCOSAL | Status: AC
  Administered 2022-03-01: 15 mL via OROMUCOSAL

## 2022-03-01 MED ORDER — ACETAMINOPHEN 500 MG PO TABS
1000.0000 mg | ORAL_TABLET | Freq: Four times a day (QID) | ORAL | Status: AC
  Administered 2022-03-01 – 2022-03-02 (×4): 1000 mg via ORAL
  Filled 2022-03-01 (×4): qty 2

## 2022-03-01 MED ORDER — SODIUM CHLORIDE (PF) 0.9 % IJ SOLN
INTRAMUSCULAR | Status: DC | PRN
  Administered 2022-03-01: 30 mL

## 2022-03-01 MED ORDER — MUPIROCIN 2 % EX OINT: 1.0000 | TOPICAL_OINTMENT | Freq: Once | CUTANEOUS | Status: AC

## 2022-03-01 MED ORDER — SENNA 8.6 MG PO TABS
1.0000 | ORAL_TABLET | Freq: Two times a day (BID) | ORAL | Status: DC
  Administered 2022-03-01 – 2022-03-02 (×2): 8.6 mg via ORAL
  Filled 2022-03-01 (×2): qty 1

## 2022-03-01 MED ORDER — FENTANYL CITRATE PF 50 MCG/ML IJ SOSY: 50.0000 ug | PREFILLED_SYRINGE | INTRAMUSCULAR | Status: DC

## 2022-03-01 MED ORDER — HYDROMORPHONE HCL 2 MG PO TABS
2.0000 mg | ORAL_TABLET | ORAL | Status: DC | PRN
  Administered 2022-03-02: 2 mg via ORAL
  Filled 2022-03-01 (×2): qty 1

## 2022-03-01 MED ORDER — AMISULPRIDE (ANTIEMETIC) 5 MG/2ML IV SOLN: 10.0000 mg | Freq: Once | INTRAVENOUS | Status: DC | PRN

## 2022-03-01 MED ORDER — PHENYLEPHRINE HCL-NACL 20-0.9 MG/250ML-% IV SOLN
INTRAVENOUS | Status: DC | PRN
  Administered 2022-03-01: 25 ug/min via INTRAVENOUS

## 2022-03-01 MED ORDER — ACETAMINOPHEN 10 MG/ML IV SOLN: 1000.0000 mg | Freq: Once | INTRAVENOUS | Status: DC | PRN

## 2022-03-01 MED ORDER — ORAL CARE MOUTH RINSE: 15.0000 mL | Freq: Once | OROMUCOSAL | Status: AC

## 2022-03-01 MED ORDER — PROPOFOL 10 MG/ML IV BOLUS
INTRAVENOUS | Status: DC | PRN
  Administered 2022-03-01: 30 mg via INTRAVENOUS
  Administered 2022-03-01: 20 mg via INTRAVENOUS

## 2022-03-01 MED ORDER — ONDANSETRON HCL 4 MG/2ML IJ SOLN: 4.0000 mg | Freq: Four times a day (QID) | INTRAMUSCULAR | Status: DC | PRN

## 2022-03-01 MED ORDER — ACETAMINOPHEN 160 MG/5ML PO SOLN: 325.0000 mg | Freq: Once | ORAL | Status: DC | PRN

## 2022-03-01 MED ORDER — METHOCARBAMOL 500 MG IVPB - SIMPLE MED
INTRAVENOUS | Status: AC
  Administered 2022-03-01: 500 mg via INTRAVENOUS
  Filled 2022-03-01: qty 55

## 2022-03-01 SURGICAL SUPPLY — 62 items
BAG COUNTER SPONGE SURGICOUNT (BAG) IMPLANT
BAG ZIPLOCK 12X15 (MISCELLANEOUS) IMPLANT
BATTERY INSTRU NAVIGATION (MISCELLANEOUS) ×3 IMPLANT
BLADE SAW RECIPROCATING 77.5 (BLADE) ×1 IMPLANT
BNDG ELASTIC 4X5.8 VLCR STR LF (GAUZE/BANDAGES/DRESSINGS) ×1 IMPLANT
BNDG ELASTIC 6X5.8 VLCR STR LF (GAUZE/BANDAGES/DRESSINGS) ×1 IMPLANT
CHLORAPREP W/TINT 26 (MISCELLANEOUS) ×2 IMPLANT
COMP FEM PS KNEE NRW 7 RT (Joint) ×1 IMPLANT
COMPONENT FEM PS KNEE NRW 7 RT (Joint) IMPLANT
COVER SURGICAL LIGHT HANDLE (MISCELLANEOUS) ×1 IMPLANT
DERMABOND ADVANCED .7 DNX12 (GAUZE/BANDAGES/DRESSINGS) ×2 IMPLANT
DRAPE INCISE IOBAN 66X45 STRL (DRAPES) ×1 IMPLANT
DRAPE SHEET LG 3/4 BI-LAMINATE (DRAPES) ×3 IMPLANT
DRAPE U-SHAPE 47X51 STRL (DRAPES) ×1 IMPLANT
DRSG AQUACEL AG ADV 3.5X10 (GAUZE/BANDAGES/DRESSINGS) ×1 IMPLANT
ELECT BLADE TIP CTD 4 INCH (ELECTRODE) ×1 IMPLANT
ELECT REM PT RETURN 15FT ADLT (MISCELLANEOUS) ×1 IMPLANT
GAUZE SPONGE 4X4 12PLY STRL (GAUZE/BANDAGES/DRESSINGS) ×1 IMPLANT
GLOVE BIO SURGEON STRL SZ7 (GLOVE) ×1 IMPLANT
GLOVE BIO SURGEON STRL SZ8.5 (GLOVE) ×2 IMPLANT
GLOVE BIOGEL PI IND STRL 7.5 (GLOVE) ×1 IMPLANT
GLOVE BIOGEL PI IND STRL 8.5 (GLOVE) ×1 IMPLANT
GOWN SPEC L3 XXLG W/TWL (GOWN DISPOSABLE) ×1 IMPLANT
GOWN STRL REUS W/ TWL XL LVL3 (GOWN DISPOSABLE) ×1 IMPLANT
GOWN STRL REUS W/TWL XL LVL3 (GOWN DISPOSABLE) ×1
HANDPIECE INTERPULSE COAX TIP (DISPOSABLE) ×1
HDLS TROCR DRIL PIN KNEE 75 (PIN) ×1
HOLDER FOLEY CATH W/STRAP (MISCELLANEOUS) ×1 IMPLANT
HOOD PEEL AWAY FLYTE STAYCOOL (MISCELLANEOUS) ×3 IMPLANT
IMPL PATELLA METAL SZ32X10 (Joint) IMPLANT
KIT TURNOVER KIT A (KITS) IMPLANT
LINER TIB ASF PS CD/3-9 10 RT (Liner) IMPLANT
MARKER SKIN DUAL TIP RULER LAB (MISCELLANEOUS) ×1 IMPLANT
NDL SAFETY ECLIP 18X1.5 (MISCELLANEOUS) ×1 IMPLANT
NDL SPNL 18GX3.5 QUINCKE PK (NEEDLE) ×1 IMPLANT
NEEDLE SPNL 18GX3.5 QUINCKE PK (NEEDLE) ×1 IMPLANT
NS IRRIG 1000ML POUR BTL (IV SOLUTION) ×1 IMPLANT
PACK TOTAL KNEE CUSTOM (KITS) ×1 IMPLANT
PADDING CAST COTTON 6X4 STRL (CAST SUPPLIES) ×1 IMPLANT
PIN DRILL HDLS TROCAR 75 4PK (PIN) IMPLANT
PROTECTOR NERVE ULNAR (MISCELLANEOUS) ×1 IMPLANT
SAW OSC TIP CART 19.5X105X1.3 (SAW) ×1 IMPLANT
SCREW FEMALE HEX FIX 25X2.5 (ORTHOPEDIC DISPOSABLE SUPPLIES) IMPLANT
SEALER BIPOLAR AQUA 6.0 (INSTRUMENTS) ×1 IMPLANT
SET HNDPC FAN SPRY TIP SCT (DISPOSABLE) ×1 IMPLANT
SET PAD KNEE POSITIONER (MISCELLANEOUS) ×1 IMPLANT
SOLUTION PRONTOSAN WOUND 350ML (IRRIGATION / IRRIGATOR) IMPLANT
SPIKE FLUID TRANSFER (MISCELLANEOUS) ×2 IMPLANT
STEM TIB PS KNEE D 0D RT (Stem) IMPLANT
SUT MNCRL AB 3-0 PS2 18 (SUTURE) ×1 IMPLANT
SUT MNCRL AB 4-0 PS2 18 (SUTURE) IMPLANT
SUT MON AB 2-0 CT1 36 (SUTURE) ×1 IMPLANT
SUT STRATAFIX PDO 1 14 VIOLET (SUTURE) ×1
SUT STRATFX PDO 1 14 VIOLET (SUTURE) ×1
SUT VIC AB 1 CTX 36 (SUTURE) ×2
SUT VIC AB 1 CTX36XBRD ANBCTR (SUTURE) ×2 IMPLANT
SUT VIC AB 2-0 CT1 27 (SUTURE) ×1
SUT VIC AB 2-0 CT1 TAPERPNT 27 (SUTURE) IMPLANT
SUTURE STRATFX PDO 1 14 VIOLET (SUTURE) ×1 IMPLANT
TRAY FOLEY MTR SLVR 16FR STAT (SET/KITS/TRAYS/PACK) IMPLANT
TUBE SUCTION HIGH CAP CLEAR NV (SUCTIONS) ×1 IMPLANT
WATER STERILE IRR 1000ML POUR (IV SOLUTION) ×2 IMPLANT

## 2022-03-01 NOTE — Discharge Instructions (Signed)
 Dr. Yossef Gilkison Total Joint Specialist [facility] [address] , [place] [address] [phone]  TOTAL KNEE REPLACEMENT POSTOPERATIVE DIRECTIONS    Knee Rehabilitation, Guidelines Following Surgery  Results after knee surgery are often greatly improved when you follow the exercise, range of motion and muscle strengthening exercises prescribed by your doctor. Safety measures are also important to protect the knee from further injury. Any time any of these exercises cause you to have increased pain or swelling in your knee joint, decrease the amount until you are comfortable again and slowly increase them. If you have problems or questions, call your caregiver or physical therapist for advice.   WEIGHT BEARING Weight bearing as tolerated with assist device (walker, cane, etc) as directed, use it as long as suggested by your surgeon or therapist, typically at least 4-6 weeks.  HOME CARE INSTRUCTIONS  Remove items at home which could result in a fall. This includes throw rugs or furniture in walking pathways.  Continue medications as instructed at time of discharge. You may have some home medications which will be placed on hold until you complete the course of blood thinner medication.  You may start showering once you are discharged home but do not submerge the incision under water. Just pat the incision dry and apply a dry gauze dressing on daily. Walk with walker as instructed.  You may resume a sexual relationship in one month or when given the OK by your doctor.  Use walker as long as suggested by your caregivers. Avoid periods of inactivity such as sitting longer than an hour when not asleep. This helps prevent blood clots.  You may put full weight on your legs and walk as much as is comfortable.  You may return to work once you are cleared by your doctor.  Do not drive a car for 6 weeks or until released by you surgeon.  Do not drive while  taking narcotics.  Wear the elastic stockings for three weeks following surgery during the day but you may remove then at night. Make sure you keep all of your appointments after your operation with all of your doctors and caregivers. You should call the office at the above phone number and make an appointment for approximately two weeks after the date of your surgery. Do not remove your surgical dressing. The dressing is waterproof; you may take showers in 3 days, but do not take tub baths or submerge the dressing. Please pick up a stool softener and laxative for home use as long as you are requiring pain medications. ICE to the affected knee every three hours for 30 minutes at a time and then as needed for pain and swelling.  Continue to use ice on the knee for pain and swelling from surgery. You may notice swelling that will progress down to the foot and ankle.  This is normal after surgery.  Elevate the leg when you are not up walking on it.   It is important for you to complete the blood thinner medication as prescribed by your doctor. Continue to use the breathing machine which will help keep your temperature down.  It is common for your temperature to cycle up and down following surgery, especially at night when you are not up moving around and exerting yourself.  The breathing machine keeps your lungs expanded and your temperature down.  RANGE OF MOTION AND STRENGTHENING EXERCISES  Rehabilitation of the knee is important following a knee injury or an   operation. After just a few days of immobilization, the muscles of the thigh which control the knee become weakened and shrink (atrophy). Knee exercises are designed to build up the tone and strength of the thigh muscles and to improve knee motion. Often times heat used for twenty to thirty minutes before working out will loosen up your tissues and help with improving the range of motion but do not use heat for the first two weeks following surgery.  These exercises can be done on a training (exercise) mat, on the floor, on a table or on a bed. Use what ever works the best and is most comfortable for you Knee exercises include:  Leg Lifts - While your knee is still immobilized in a splint or cast, you can do straight leg raises. Lift the leg to 60 degrees, hold for 3 sec, and slowly lower the leg. Repeat 10-20 times 2-3 times daily. Perform this exercise against resistance later as your knee gets better.  Quad and Hamstring Sets - Tighten up the muscle on the front of the thigh (Quad) and hold for 5-10 sec. Repeat this 10-20 times hourly. Hamstring sets are done by pushing the foot backward against an object and holding for 5-10 sec. Repeat as with quad sets.  A rehabilitation program following serious knee injuries can speed recovery and prevent re-injury in the future due to weakened muscles. Contact your doctor or a physical therapist for more information on knee rehabilitation.   POST-OPERATIVE OPIOID TAPER INSTRUCTIONS: It is important to wean off of your opioid medication as soon as possible. If you do not need pain medication after your surgery it is ok to stop day one. Opioids include: Codeine, Hydrocodone(Norco, Vicodin), Oxycodone(Percocet, oxycontin) and hydromorphone amongst others.  Long term and even short term use of opiods can cause: Increased pain response Dependence Constipation Depression Respiratory depression And more.  Withdrawal symptoms can include Flu like symptoms Nausea, vomiting And more Techniques to manage these symptoms Hydrate well Eat regular healthy meals Stay active Use relaxation techniques(deep breathing, meditating, yoga) Do Not substitute Alcohol to help with tapering If you have been on opioids for less than two weeks and do not have pain than it is ok to stop all together.  Plan to wean off of opioids This plan should start within one week post op of your joint replacement. Maintain the same  interval or time between taking each dose and first decrease the dose.  Cut the total daily intake of opioids by one tablet each day Next start to increase the time between doses. The last dose that should be eliminated is the evening dose.    SKILLED REHAB INSTRUCTIONS: If the patient is transferred to a skilled rehab facility following release from the hospital, a list of the current medications will be sent to the facility for the patient to continue.  When discharged from the skilled rehab facility, please have the facility set up the patient's Home Health Physical Therapy prior to being released. Also, the skilled facility will be responsible for providing the patient with their medications at time of release from the facility to include their pain medication, the muscle relaxants, and their blood thinner medication. If the patient is still at the rehab facility at time of the two week follow up appointment, the skilled rehab facility will also need to assist the patient in arranging follow up appointment in our office and any transportation needs.  MAKE SURE YOU:  Understand these instructions.  Will watch   your condition.  Will get help right away if you are not doing well or get worse.    Pick up stool softner and laxative for home use following surgery while on pain medications. Do NOT remove your dressing. You may shower.  Do not take tub baths or submerge incision under water. May shower starting three days after surgery. Please use a clean towel to pat the incision dry following showers. Continue to use ice for pain and swelling after surgery. Do not use any lotions or creams on the incision until instructed by your surgeon.  

## 2022-03-01 NOTE — Anesthesia Procedure Notes (Signed)
Anesthesia Regional Block: Adductor canal block   Pre-Anesthetic Checklist: , timeout performed,  Correct Patient, Correct Site, Correct Laterality,  Correct Procedure, Correct Position, site marked,  Risks and benefits discussed,  Surgical consent,  Pre-op evaluation,  At surgeon's request and post-op pain management  Laterality: Right  Prep: chloraprep       Needles:  Injection technique: Single-shot  Needle Type: Echogenic Stimulator Needle     Needle Length: 9cm  Needle Gauge: 21     Additional Needles:   Procedures:,,,, ultrasound used (permanent image in chart),,    Narrative:  Start time: 03/01/2022 9:55 AM End time: 03/01/2022 10:00 AM Injection made incrementally with aspirations every 5 mL.  Performed by: Personally  Anesthesiologist: Effie Berkshire, MD  Additional Notes: Patient tolerated the procedure well. Local anesthetic introduced in an incremental fashion under minimal resistance after negative aspirations. No paresthesias were elicited. After completion of the procedure, no acute issues were identified and patient continued to be monitored by RN.

## 2022-03-01 NOTE — Evaluation (Signed)
Physical Therapy Evaluation Patient Details Name: Brenda Cochran MRN: [id number] DOB: Jan 14, 1951 Today's Date: 03/01/2022  History of Present Illness  Pt is a 71yo female presenting s/p R-TKA on 03/01/22. PMH: angina, anxiety & depression, anemia, asthma, CAD, chronic fatigue syndrome, COPD, CAD, DM, HTN, fibromyalgia, GERD, HLD, hypothyroidism, hx of PE, HLD, hx of NSTEMI, OSA, RA, SOB, hx of TIA, tobacco abuse (reporting quit 10/2021).   Clinical Impression  Brenda Cochran is a 71 y.o. female POD 0 s/p R-TKA. Patient reports modified independence using WC primarily for mobility at baseline. Patient is now limited by functional impairments (see PT problem list below) and requires min guard for bed mobility and for transfers. Patient was able to ambulate 30 feet with RW and min guard level of assist. Patient instructed in exercise to facilitate ROM and circulation to manage edema. Provided incentive spirometer and with Vcs pt able to achieve 1239m. Patient will benefit from continued skilled PT interventions to address impairments and progress towards PLOF. Acute PT will follow to progress mobility and HEP in preparation for safe discharge home.       Recommendations for follow up therapy are one component of a multi-disciplinary discharge planning process, led by the attending physician.  Recommendations may be updated based on patient status, additional functional criteria and insurance authorization.  Follow Up Recommendations Follow physician's recommendations for discharge plan and follow up therapies      Assistance Recommended at Discharge Frequent or constant Supervision/Assistance  Patient can return home with the following  A little help with walking and/or transfers;A little help with bathing/dressing/bathroom;Assistance with cooking/housework;Assist for transportation;Help with stairs or ramp for entrance    Equipment Recommendations Rolling walker (2 wheels)  Recommendations for  Other Services       Functional Status Assessment Patient has had a recent decline in their functional status and demonstrates the ability to make significant improvements in function in a reasonable and predictable amount of time.     Precautions / Restrictions Precautions Precautions: Fall;Knee Precaution Booklet Issued: No Precaution Comments: no pillow under knee Restrictions Weight Bearing Restrictions: No Other Position/Activity Restrictions: wbat      Mobility  Bed Mobility Overal bed mobility: Needs Assistance Bed Mobility: Supine to Sit     Supine to sit: Min guard     General bed mobility comments: for safety only, use of bed rails.    Transfers Overall transfer level: Needs assistance Equipment used: Rolling walker (2 wheels) Transfers: Sit to/from Stand Sit to Stand: Min guard           General transfer comment: For safety only, no physical assist required    Ambulation/Gait Ambulation/Gait assistance: Min guard, +2 safety/equipment Gait Distance (Feet): 30 Feet Assistive device: Rolling walker (2 wheels) Gait Pattern/deviations: Step-to pattern Gait velocity: decreased     General Gait Details: Pt ambulated 341fwtih RW and min guard +2 for recliner follow, no physical assist required or overt LOB noted. Pt reporting decreased sensation on R foot but able to provide adequate motor control for safe ambulation.  Stairs            Wheelchair Mobility    Modified Rankin (Stroke Patients Only)       Balance Overall balance assessment: Needs assistance Sitting-balance support: Feet supported, No upper extremity supported Sitting balance-Leahy Scale: Good     Standing balance support: Reliant on assistive device for balance, During functional activity, Bilateral upper extremity supported Standing balance-Leahy Scale: Poor  Pertinent Vitals/Pain Pain Assessment Pain Assessment: 0-10 Pain  Score: 8  Pain Location: right knee Pain Descriptors / Indicators: Operative site guarding, Cramping Pain Intervention(s): Limited activity within patient's tolerance, Monitored during session, Repositioned, Ice applied    Home Living Family/patient expects to be discharged to:: Private residence Living Arrangements: Other relatives Available Help at Discharge: Family;Available 24 hours/day Type of Home: House Home Access: Ramped entrance       Home Layout: One level Home Equipment: Grab bars - tub/shower;Other (comment);Rollator (4 wheels) ("Gaurdian")      Prior Function Prior Level of Function : Independent/Modified Independent             Mobility Comments: WC "for the past year because my right knee locked up and I couldn't straighten it to walk" ADLs Comments: IND     Hand Dominance   Dominant Hand: Right    Extremity/Trunk Assessment   Upper Extremity Assessment Upper Extremity Assessment: Overall WFL for tasks assessed    Lower Extremity Assessment Lower Extremity Assessment: RLE deficits/detail;LLE deficits/detail RLE Deficits / Details: mmt ank dfpf 5/5, no extensor lag noted RLE Sensation: decreased light touch (at glutes, right foot "duller" than L) LLE Deficits / Details: mmt ank dfpf 5/5 LLE Sensation: WNL    Cervical / Trunk Assessment Cervical / Trunk Assessment: Kyphotic  Communication   Communication: No difficulties  Cognition Arousal/Alertness: Awake/alert Behavior During Therapy: WFL for tasks assessed/performed Overall Cognitive Status: Within Functional Limits for tasks assessed                                          General Comments General comments (skin integrity, edema, etc.): Granddaughter Madison present    Exercises Total Joint Exercises Ankle Circles/Pumps: AROM, Both, 10 reps   Assessment/Plan    PT Assessment Patient needs continued PT services  PT Problem List Decreased strength;Decreased range of  motion;Decreased activity tolerance;Decreased balance;Decreased mobility;Decreased coordination;Pain       PT Treatment Interventions DME instruction;Gait training;Stair training;Functional mobility training;Therapeutic activities;Therapeutic exercise;Balance training;Neuromuscular re-education;Patient/family education;Wheelchair mobility training    PT Goals (Current goals can be found in the Care Plan section)  Acute Rehab PT Goals Patient Stated Goal: To walk better & use WC less PT Goal Formulation: With patient Time For Goal Achievement: 03/08/22 Potential to Achieve Goals: Good    Frequency 7X/week     Co-evaluation               AM-PAC PT "6 Clicks" Mobility  Outcome Measure Help needed turning from your back to your side while in a flat bed without using bedrails?: A Little Help needed moving from lying on your back to sitting on the side of a flat bed without using bedrails?: A Little Help needed moving to and from a bed to a chair (including a wheelchair)?: A Little Help needed standing up from a chair using your arms (e.g., wheelchair or bedside chair)?: A Little Help needed to walk in hospital room?: A Little Help needed climbing 3-5 steps with a railing? : A Little 6 Click Score: 18    End of Session Equipment Utilized During Treatment: Gait belt Activity Tolerance: Patient tolerated treatment well;No increased pain Patient left: in chair;with call bell/phone within reach;with chair alarm set;with family/visitor present;with SCD's reapplied Nurse Communication: Mobility status PT Visit Diagnosis: Pain;Difficulty in walking, not elsewhere classified (R26.2) Pain - Right/Left: Right Pain - part of  body: Knee    Time: 8850-2774 PT Time Calculation (min) (ACUTE ONLY): 20 min   Charges:   PT Evaluation $PT Eval Low Complexity: 1 Low          Coolidge Breeze, PT, DPT [facility] Office: [phone] Weekend pager:  [phone]  Coolidge Breeze 03/01/2022, 6:25 PM

## 2022-03-01 NOTE — Plan of Care (Signed)
  Problem: Education: Goal: Knowledge of the prescribed therapeutic regimen will improve Outcome: Progressing Goal: Individualized Educational Video(s) Outcome: Progressing   Problem: Pain Management: Goal: Pain level will decrease with appropriate interventions Outcome: Progressing   Problem: Activity: Goal: Risk for activity intolerance will decrease Outcome: Progressing

## 2022-03-01 NOTE — Transfer of Care (Signed)
Immediate Anesthesia Transfer of Care Note  Patient: Brenda Cochran  Procedure(s) Performed: COMPUTER ASSISTED TOTAL KNEE ARTHROPLASTY (Right: Knee)  Patient Location: PACU  Anesthesia Type:spinal  Level of Consciousness: awake, alert  and oriented  Airway & Oxygen Therapy: Patient Spontanous Breathing and Patient connected to face mask oxygen  Post-op Assessment: Report given to RN and Post -op Vital signs reviewed and stable  Post vital signs: Reviewed and stable  Last Vitals:  Vitals Value Taken Time  BP 117/72 03/01/22 1321  Temp 36.8 C 03/01/22 1320  Pulse 91 03/01/22 1325  Resp 14 03/01/22 1325  SpO2 100 % 03/01/22 1325  Vitals shown include unvalidated device data.  Last Pain:  Vitals:   03/01/22 0836  TempSrc: Oral         Complications: No notable events documented.

## 2022-03-01 NOTE — Op Note (Signed)
OPERATIVE REPORT  SURGEON: Rod Can, MD   ASSISTANT: Larene Pickett, PA-C  PREOPERATIVE DIAGNOSIS: Primary Right knee arthritis.   POSTOPERATIVE DIAGNOSIS: Primary Right knee arthritis.   PROCEDURE: Computer assisted Right total knee arthroplasty.   IMPLANTS: Zimmer Persona PPS Cementless CR femur, size 7 Narrow. Persona 0 degree Spiked Keel OsseoTi Tibia, size D. Vivacit-E polyethelyene insert, size 10 mm, CR. TM standard patella, size 32 mm.  ANESTHESIA:  MAC, Regional, and Spinal  TOURNIQUET TIME: Not utilized.   ESTIMATED BLOOD LOSS: 250 mL    ANTIBIOTICS: 2g Ancef.  DRAINS: None.  COMPLICATIONS: None   CONDITION: PACU - hemodynamically stable.   BRIEF CLINICAL NOTE: Brenda Cochran is a 71 y.o. female with a long-standing history of Right knee arthritis. After failing conservative management, the patient was indicated for total knee arthroplasty. The risks, benefits, and alternatives to the procedure were explained, and the patient elected to proceed.  PROCEDURE IN DETAIL: Adductor canal block was obtained in the pre-op holding area. Once inside the operative room, spinal anesthesia was obtained, and a foley catheter was inserted. The patient was then positioned and the lower extremity was prepped and draped in the normal sterile surgical fashion.  A time-out was called verifying side and site of surgery. The patient received IV antibiotics within 60 minutes of beginning the procedure. A tourniquet was not utilized.   An anterior approach to the knee was performed utilizing a midvastus arthrotomy. A medial release was performed and the patellar fat pad was excised. Stryker imageless navigation was used to cut the distal femur perpendicular to the mechanical axis. A freehand patellar resection was performed, and the patella was sized an prepared with 3 lug holes.  Nagivation was used to make a neutral proximal tibia resection, taking 6 mm of bone from the less affected  lateral side with 3 degrees of slope. The menisci were excised. A spacer block was placed, and the alignment and balance in extension were confirmed.   The distal femur was sized using the 3-degree external rotation guide referencing the posterior femoral cortex. The appropriate 4-in-1 cutting block was pinned into place. Rotation was checked using Whiteside's line, the epicondylar axis, and then confirmed with a spacer block in flexion. The remaining femoral cuts were performed, taking care to protect the MCL.  The tibia was sized and the trial tray was pinned into place. The remaining trail components were inserted. The knee was stable to varus and valgus stress through a full range of motion. The patella tracked centrally, and the PCL was well balanced. The trial components were removed, and the proximal tibial surface was prepared. Final components were impacted into place. The knee was tested for a final time and found to be well balanced.   The wound was copiously irrigated with Prontosan solution and normal saline using pule lavage.  Marcaine solution was injected into the periarticular soft tissue.  The wound was closed in layers using #1 Vicryl and Stratafix for the fascia, 2-0 Vicryl for the subcutaneous fat, 2-0 Monocryl for the deep dermal layer, 3-0 running Monocryl subcuticular Stitch, and 4-0 Monocryl stay sutures at both ends of the wound. Dermabond was applied to the skin.  Once the glue was fully dried, an Aquacell Ag and compressive dressing were applied.  The patient was transported to the recovery room in stable condition.  Sponge, needle, and instrument counts were correct at the end of the case x2.  The patient tolerated the procedure well and there were no  known complications.  Please note that a surgical assistant was a medical necessity for this procedure in order to perform it in a safe and expeditious manner. Surgical assistant was necessary to retract the ligaments and vital  neurovascular structures to prevent injury to them and also necessary for proper positioning of the limb to allow for anatomic placement of the prosthesis.

## 2022-03-01 NOTE — Interval H&P Note (Signed)
History and Physical Interval Note:  03/01/2022 9:24 AM  Brenda Cochran  has presented today for surgery, with the diagnosis of Right knee osteoarthritis.  The various methods of treatment have been discussed with the patient and family. After consideration of risks, benefits and other options for treatment, the patient has consented to  Procedure(s) with comments: [place] (Right) - 150 as a surgical intervention.  The patient's history has been reviewed, patient examined, no change in status, stable for surgery.  I have reviewed the patient's chart and labs.  Questions were answered to the patient's satisfaction.    The risks, benefits, and alternatives were discussed with the patient. There are risks associated with the surgery including, but not limited to, problems with anesthesia (death), infection, instability (giving out of the joint), dislocation, differences in leg length/angulation/rotation, fracture of bones, loosening or failure of implants, hematoma (blood accumulation) which may require surgical drainage, blood clots, pulmonary embolism, nerve injury (foot drop and lateral thigh numbness), and blood vessel injury. The patient understands these risks and elects to proceed.    Hilton Cork Denney Shein

## 2022-03-01 NOTE — Progress Notes (Signed)
Pt refused her Incruse medicine.  Pt stated she does not like any powder inhalers.  Medication returned to pharmacy per patient's request.

## 2022-03-01 NOTE — Anesthesia Procedure Notes (Signed)
Spinal  Patient location during procedure: OR Start time: 03/01/2022 10:55 AM End time: 03/01/2022 11:01 AM Reason for block: surgical anesthesia Staffing Performed: resident/CRNA  Resident/CRNA: Milford Cage, CRNA Performed by: Milford Cage, CRNA Authorized by: Effie Berkshire, MD   Preanesthetic Checklist Completed: patient identified, IV checked, site marked, risks and benefits discussed, surgical consent, monitors and equipment checked, pre-op evaluation and timeout performed Spinal Block Patient position: sitting Prep: DuraPrep and site prepped and draped Patient monitoring: heart rate, cardiac monitor, continuous pulse ox and blood pressure Approach: midline Location: L3-4 Injection technique: single-shot Needle Needle type: Pencan  Needle gauge: 24 G Needle length: 10 cm Assessment Sensory level: T6 Events: CSF return Additional Notes Functioning IV was confirmed and monitors were applied. Sterile prep and drape, including hand hygiene and sterile gloves were used. The patient was positioned and the spine was prepped. The skin was anesthetized with lidocaine.  Free flow of clear CSF was obtained prior to injecting local anesthetic into the CSF.  The spinal needle aspirated freely following injection.  The needle was carefully withdrawn.  The patient tolerated the procedure well.

## 2022-03-02 ENCOUNTER — Encounter (HOSPITAL_COMMUNITY): Payer: Self-pay | Admitting: Orthopedic Surgery

## 2022-03-02 DIAGNOSIS — M1711 Unilateral primary osteoarthritis, right knee: Secondary | ICD-10-CM | POA: Diagnosis not present

## 2022-03-02 LAB — CBC
HCT: 34.5 % — ABNORMAL LOW (ref 36.0–46.0)
Hemoglobin: 11.3 g/dL — ABNORMAL LOW (ref 12.0–15.0)
MCH: 30 pg (ref 26.0–34.0)
MCHC: 32.8 g/dL (ref 30.0–36.0)
MCV: 91.5 fL (ref 80.0–100.0)
Platelets: 216 10*3/uL (ref 150–400)
RBC: 3.77 MIL/uL — ABNORMAL LOW (ref 3.87–5.11)
RDW: 12.1 % (ref 11.5–15.5)
WBC: 9.3 10*3/uL (ref 4.0–10.5)
nRBC: 0 % (ref 0.0–0.2)

## 2022-03-02 LAB — BASIC METABOLIC PANEL
Anion gap: 8 (ref 5–15)
BUN: 11 mg/dL (ref 8–23)
CO2: 21 mmol/L — ABNORMAL LOW (ref 22–32)
Calcium: 8.1 mg/dL — ABNORMAL LOW (ref 8.9–10.3)
Chloride: 106 mmol/L (ref 98–111)
Creatinine, Ser: 0.78 mg/dL (ref 0.44–1.00)
GFR, Estimated: >60 mL/min (ref >60)
Glucose, Bld: 367 mg/dL — ABNORMAL HIGH (ref 70–99)
Potassium: 4.2 mmol/L (ref 3.5–5.1)
Sodium: 135 mmol/L (ref 135–145)

## 2022-03-02 LAB — GLUCOSE, CAPILLARY: Glucose-Capillary: 262 mg/dL — ABNORMAL HIGH (ref 70–99)

## 2022-03-02 MED ORDER — POLYETHYLENE GLYCOL 3350 17 G PO PACK: 17.0000 g | PACK | Freq: Every day | ORAL | 0 refills | Status: AC | PRN

## 2022-03-02 MED ORDER — ASPIRIN 81 MG PO CHEW: 81.0000 mg | CHEWABLE_TABLET | Freq: Two times a day (BID) | ORAL | 0 refills | Status: AC

## 2022-03-02 MED ORDER — DOCUSATE SODIUM 100 MG PO CAPS: 100.0000 mg | ORAL_CAPSULE | Freq: Two times a day (BID) | ORAL | 0 refills | Status: AC

## 2022-03-02 MED ORDER — METHOCARBAMOL 500 MG PO TABS: 500.0000 mg | ORAL_TABLET | Freq: Four times a day (QID) | ORAL | 0 refills | Status: DC | PRN

## 2022-03-02 MED ORDER — ONDANSETRON HCL 4 MG PO TABS: 4.0000 mg | ORAL_TABLET | Freq: Three times a day (TID) | ORAL | 0 refills | Status: DC | PRN

## 2022-03-02 MED ORDER — HYDROMORPHONE HCL 2 MG PO TABS: 1.0000 mg | ORAL_TABLET | ORAL | 0 refills | Status: AC | PRN

## 2022-03-02 MED ORDER — SENNA 8.6 MG PO TABS: 2.0000 | ORAL_TABLET | Freq: Every day | ORAL | 0 refills | Status: AC

## 2022-03-02 NOTE — Progress Notes (Signed)
Discharge package printed and instructions given to patient. Patient verbalizes understanding. 

## 2022-03-02 NOTE — Progress Notes (Signed)
Physical Therapy Treatment Patient Details Name: Brenda Cochran MRN: [id number] DOB: 12-Jan-1951 Today's Date: 03/02/2022   History of Present Illness Pt is a 71yo female presenting s/p R-TKA on 03/01/22. PMH: angina, anxiety & depression, anemia, asthma, CAD, chronic fatigue syndrome, COPD, CAD, DM, HTN, fibromyalgia, GERD, HLD, hypothyroidism, hx of PE, HLD, hx of NSTEMI, OSA, RA, SOB, hx of TIA, tobacco abuse (reporting quit 10/2021).    PT Comments    Pt motivated and progressing well with mobility.  Pt up to hall to ambulate increased distance, negotiated step, performed HEP with written instruction provided and reviewed.  Pt eager for dc home this date.  Recommendations for follow up therapy are one component of a multi-disciplinary discharge planning process, led by the attending physician.  Recommendations may be updated based on patient status, additional functional criteria and insurance authorization.  Follow Up Recommendations  Follow physician's recommendations for discharge plan and follow up therapies     Assistance Recommended at Discharge Frequent or constant Supervision/Assistance  Patient can return home with the following A little help with walking and/or transfers;A little help with bathing/dressing/bathroom;Assistance with cooking/housework;Assist for transportation;Help with stairs or ramp for entrance   Equipment Recommendations  Rolling walker (2 wheels)    Recommendations for Other Services       Precautions / Restrictions Precautions Precautions: Fall;Knee Precaution Booklet Issued: No Precaution Comments: no pillow under knee Restrictions Weight Bearing Restrictions: No Other Position/Activity Restrictions: wbat     Mobility  Bed Mobility Overal bed mobility: Needs Assistance Bed Mobility: Supine to Sit     Supine to sit: Supervision     General bed mobility comments: for safety only, use of bed rails.    Transfers Overall transfer level:  Needs assistance Equipment used: Rolling walker (2 wheels) Transfers: Sit to/from Stand Sit to Stand: Min guard, Supervision           General transfer comment: cues for LE management and use of UEs to self assist    Ambulation/Gait Ambulation/Gait assistance: Min guard, Supervision Gait Distance (Feet): 100 Feet Assistive device: Rolling walker (2 wheels) Gait Pattern/deviations: Step-to pattern       General Gait Details: Cues for sequence, posture and position from RW and to decrease pace for safety   Stairs Stairs: Yes Stairs assistance: Min assist Stair Management: No rails, Step to pattern, Backwards, Forwards, With walker Number of Stairs: 2 General stair comments: single step fwd and bkwd with cues for sequence   Wheelchair Mobility    Modified Rankin (Stroke Patients Only)       Balance Overall balance assessment: Needs assistance Sitting-balance support: Feet supported, No upper extremity supported Sitting balance-Leahy Scale: Good     Standing balance support: Single extremity supported Standing balance-Leahy Scale: Poor                              Cognition Arousal/Alertness: Awake/alert Behavior During Therapy: WFL for tasks assessed/performed Overall Cognitive Status: Within Functional Limits for tasks assessed                                          Exercises Total Joint Exercises Ankle Circles/Pumps: AROM, Both, 10 reps Quad Sets: AROM, Both, 10 reps, Supine Heel Slides: AAROM, Right, 15 reps, Supine Straight Leg Raises: AAROM, AROM, Right, 15 reps, Supine Long Arc Quad: AAROM, Right,  10 reps, Seated Goniometric ROM: R knee -8 - 75 AAROM    General Comments        Pertinent Vitals/Pain Pain Assessment Pain Assessment: 0-10 Pain Score: 7  Pain Location: right knee Pain Descriptors / Indicators: Aching, Sore Pain Intervention(s): Limited activity within patient's tolerance, Monitored during session,  Premedicated before session, Ice applied    Home Living                          Prior Function            PT Goals (current goals can now be found in the care plan section) Acute Rehab PT Goals Patient Stated Goal: To walk better & use WC less PT Goal Formulation: With patient Time For Goal Achievement: 03/08/22 Potential to Achieve Goals: Good Progress towards PT goals: Progressing toward goals    Frequency    7X/week      PT Plan Current plan remains appropriate    Co-evaluation              AM-PAC PT "6 Clicks" Mobility   Outcome Measure  Help needed turning from your back to your side while in a flat bed without using bedrails?: A Little Help needed moving from lying on your back to sitting on the side of a flat bed without using bedrails?: A Little Help needed moving to and from a bed to a chair (including a wheelchair)?: A Little Help needed standing up from a chair using your arms (e.g., wheelchair or bedside chair)?: A Little Help needed to walk in hospital room?: A Little Help needed climbing 3-5 steps with a railing? : A Little 6 Click Score: 18    End of Session Equipment Utilized During Treatment: Gait belt Activity Tolerance: Patient tolerated treatment well Patient left: in chair;with call bell/phone within reach;with chair alarm set;with family/visitor present Nurse Communication: Mobility status PT Visit Diagnosis: Pain;Difficulty in walking, not elsewhere classified (R26.2) Pain - Right/Left: Right Pain - part of body: Knee     Time: 0912-0950 PT Time Calculation (min) (ACUTE ONLY): 38 min  Charges:  $Gait Training: 8-22 mins $Therapeutic Exercise: 8-22 mins $Therapeutic Activity: 8-22 mins                     Debe Coder PT Acute Rehabilitation Services Pager [phone] Office [phone]    Eliu Batch 03/02/2022, 1:11 PM

## 2022-03-02 NOTE — Anesthesia Postprocedure Evaluation (Signed)
Anesthesia Post Note  Patient: Brenda Cochran  Procedure(s) Performed: COMPUTER ASSISTED TOTAL KNEE ARTHROPLASTY (Right: Knee)     Patient location during evaluation: PACU Anesthesia Type: Spinal Level of consciousness: oriented and awake and alert Pain management: pain level controlled Vital Signs Assessment: post-procedure vital signs reviewed and stable Respiratory status: spontaneous breathing, respiratory function stable and patient connected to nasal cannula oxygen Cardiovascular status: blood pressure returned to baseline and stable Postop Assessment: no headache, no backache and no apparent nausea or vomiting Anesthetic complications: no   No notable events documented.              Effie Berkshire

## 2022-03-02 NOTE — Progress Notes (Signed)
    Subjective:  Patient reports pain as mild to moderate.  Denies N/V/CP/SOB/Abd pain. She states her pain is well controlled. She states that it feels like it is pulling behind her leg, that is causing most of her discomfort. She denies tingling and numbness in LE bilaterally.   All questions solicited and answered.   Objective:   VITALS:   Vitals:   03/01/22 1658 03/01/22 1924 03/02/22 0210 03/02/22 0619  BP: 124/74  (!) 105/56 107/61  Pulse: 65  74 73  Resp: 16  18   Temp: (!) 97.4 F (36.3 C)  97.7 F (36.5 C) 97.6 F (36.4 C)  TempSrc:    Oral  SpO2: 99% 98% 97% 98%  Weight:      Height:        Patient is lying in bed. Daughter at bedside. NAD.  Neurologically intact ABD soft Neurovascular intact Sensation intact distally Intact pulses distally Dorsiflexion/Plantar flexion intact Incision: dressing C/D/I No cellulitis present Compartment soft   Lab Results  Component Value Date   WBC 9.3 03/02/2022   HGB 11.3 (L) 03/02/2022   HCT 34.5 (L) 03/02/2022   MCV 91.5 03/02/2022   PLT 216 03/02/2022   BMET    Component Value Date/Time   NA 135 03/02/2022 0400   NA 134 09/08/2021 1442   K 4.2 03/02/2022 0400   CL 106 03/02/2022 0400   CO2 21 (L) 03/02/2022 0400   GLUCOSE 367 (H) 03/02/2022 0400   BUN 11 03/02/2022 0400   BUN 7 (L) 09/08/2021 1442   CREATININE 0.78 03/02/2022 0400   CALCIUM 8.1 (L) 03/02/2022 0400   EGFR 101 09/08/2021 1442   GFRNONAA >60 03/02/2022 0400     Assessment/Plan: 1 Day Post-Op   Principal Problem:   Osteoarthritis of right knee  Hemoglobin 11.3, stable.   WBAT with walker DVT ppx: Aspirin, SCDs, TEDS PO pain control PT/OT: She ambulated 30 feet with PT yesterday. PT to come by again today.  Dispo: D/c home with OPPT once cleared by PT. Remove foley catheter today.    Charlott Rakes, PA-C 03/02/2022, 7:38 AM   [facility]  [place] [address], [place], [place] [address] Phone:  [phone] [email or web address] Facebook  [employer]

## 2022-03-02 NOTE — Plan of Care (Signed)
  Problem: Activity: Goal: Ability to avoid complications of mobility impairment will improve Outcome: Progressing Goal: Range of joint motion will improve Outcome: Progressing   Problem: Pain Management: Goal: Pain level will decrease with appropriate interventions Outcome: Progressing   

## 2022-03-02 NOTE — TOC Transition Note (Signed)
Transition of Care [facility]) - CM/SW Discharge Note   Patient Details  Name: Brenda Cochran MRN: [id number] Date of Birth: 12/21/1950  Transition of [facility]) CM/SW Contact:  Lennart Pall, LCSW Phone Number: 03/02/2022, 1:58 PM   Clinical Narrative:    Met with pt and confirming she has received a RW via [place].  OPPT already set up with [facility].  No futher TOC needs.   Final next level of care: OP Rehab Barriers to Discharge: No Barriers Identified   Patient Goals and CMS Choice Patient states their goals for this hospitalization and ongoing recovery are:: return home      Discharge Placement                       Discharge Plan and Services                DME Arranged: Walker rolling DME Agency: [place]                  Social Determinants of Health (SDOH) Interventions     Readmission Risk Interventions     No data to display

## 2022-03-04 NOTE — Discharge Summary (Signed)
Physician Discharge Summary  Patient ID: Brenda Cochran MRN: [id number] DOB/AGE: December 02, 1950 71 y.o.  Admit date: 03/01/2022 Discharge date: 9/28/023  Admission Diagnoses:  Osteoarthritis of right knee  Discharge Diagnoses:  Principal Problem:   Osteoarthritis of right knee   Past Medical History:  Diagnosis Date   Anemia    years ago per pt   Angina pectoris ([place]) 01/10/2019   Anxiety    Asthma    Asthma, chronic obstructive, with acute exacerbation ([place])    [name] 01/13/2014  FEV1 76% predicted FVC 77% predicted FEV1 to FVC ratio 100% predicted FEF 25 7569% predicted CXR 01/13/14: NAD Alpha one antitrypsin 156  IgE 56 sl elevated.  RAST neg     Atherosclerotic heart disease of native coronary artery without angina pectoris    CAD (coronary artery disease) 09/23/2018   Chronic fatigue syndrome 12/22/2020   Chronic maxillary sinusitis    Chronic tension-type headache, intractable 08/07/2019   Cigarette nicotine dependence with nicotine-induced disorder 08/18/2019   Continuous dependence on cigarette smoking 06/09/2019   COPD (chronic obstructive pulmonary disease) (HCC)    COPD mixed type ([place]) 11/09/2020   COPD with acute exacerbation ([place]) 11/10/2020   Coronary artery disease involving native coronary artery of native heart without angina pectoris 07/25/2018   Cough 10/29/2019   Depression    Dyslipidemia associated with type 2 diabetes mellitus ([place]) 08/23/2018   Dysrhythmia    Essential hypertension 07/18/2018   Family history of colonic polyps    Fibromyalgia    GERD (gastroesophageal reflux disease)    Headache    History of colon polyps    Hyperlipidemia    Hypertensive heart disease without heart failure    Hypothyroidism (acquired)    Loculated pleural effusion 12/30/2019   Major depressive disorder, single episode, mild ([place])    Menopausal and female climacteric states    Mixed hyperlipidemia    Nicotine dependence, cigarettes, with unspecified nicotine-induced  disorders    NSTEMI (non-ST elevated myocardial infarction) ([place]) 07/12/2018   Numbness and tingling in left upper extremity    Obesity 12/22/2020   OSA (obstructive sleep apnea) 01/13/2014   Sleep study 02/19/14 : AHI 5.5.  Mild sleep apnea.  Lowest SpO2 81%    Overweight    Pain in right knee 07/04/2017   Palpitations 08/15/2019   Pleural effusion, not elsewhere classified 12/30/2019   Pneumonia due to COVID-19 virus 01/06/2020   Preoperative cardiovascular examination 08/30/2021   Primary osteoarthritis    RA (rheumatoid arthritis) ([place])    Brenda Cochran   Shortness of breath 10/29/2019   Skin cancer    face   Sleep apnea    cpap   TIA (transient ischemic attack) 09/14/2016   Type 2 diabetes mellitus ([place])    Type 2 diabetes mellitus with diabetic nephropathy (HCC)    Vitamin D deficiency, unspecified     Surgeries: Procedure(s): COMPUTER ASSISTED TOTAL KNEE ARTHROPLASTY on 03/01/2022   Consultants (if any):   Discharged Condition: Improved  Hospital Course: Brenda Cochran is an 71 y.o. female who was admitted 03/01/2022 with a diagnosis of Osteoarthritis of right knee and went to the operating room on 03/01/2022 and underwent the above named procedures.    She was given perioperative antibiotics:  Anti-infectives (From admission, onward)    Start     Dose/Rate Route Frequency Ordered Stop   03/01/22 1700  ceFAZolin (ANCEF) IVPB 2g/100 mL premix        2 g 200 mL/hr over 30 Minutes Intravenous  Every 6 hours 03/01/22 1438 03/02/22 0000   03/01/22 0830  ceFAZolin (ANCEF) IVPB 2g/100 mL premix        2 g 200 mL/hr over 30 Minutes Intravenous On call to O.R. 03/01/22 8938 03/01/22 1110       She was given sequential compression devices, early ambulation, and Aspirin for DVT prophylaxis.  POD#1 She ambulated 100 feet with PT.   She benefited maximally from the hospital stay and there were no complications.    Recent vital signs:  Vitals:   03/02/22 0619 03/02/22  0959  BP: 107/61 (!) 97/59  Pulse: 73 80  Resp:    Temp: 97.6 F (36.4 C) 97.6 F (36.4 C)  SpO2: 98% 99%    Recent laboratory studies:  Lab Results  Component Value Date   HGB 11.3 (L) 03/02/2022   HGB 16.0 (H) 02/20/2022   HGB 14.6 09/13/2021   Lab Results  Component Value Date   WBC 9.3 03/02/2022   PLT 216 03/02/2022   Lab Results  Component Value Date   INR 1.05 07/12/2018   Lab Results  Component Value Date   NA 135 03/02/2022   K 4.2 03/02/2022   CL 106 03/02/2022   CO2 21 (L) 03/02/2022   BUN 11 03/02/2022   CREATININE 0.78 03/02/2022   GLUCOSE 367 (H) 03/02/2022     Allergies as of 03/02/2022       Reactions   Shellfish Allergy Anaphylaxis   Contrast Media [iodinated Contrast Media] Rash   Metformin And Related Nausea And Vomiting   Duloxetine    Other reaction(s): Not available   Oxycodone    Other reaction(s): Not available   Amitriptyline Hcl Other (See Comments)   Somnolence   Cymbalta [duloxetine Hcl] Nausea And Vomiting   Iohexol Rash   Oxycontin [oxycodone Hcl] Palpitations   Ultram [tramadol] Nausea And Vomiting        Medication List     STOP taking these medications    aspirin EC 81 MG tablet Replaced by: aspirin 81 MG chewable tablet   leflunomide 20 MG tablet Commonly known as: ARAVA       TAKE these medications    albuterol 108 (90 Base) MCG/ACT inhaler Commonly known as: VENTOLIN HFA Inhale 1-2 puffs into the lungs every 6 (six) hours as needed for wheezing or shortness of breath.   albuterol (2.5 MG/3ML) 0.083% nebulizer solution Commonly known as: PROVENTIL Take 3 mLs (2.5 mg total) by nebulization every 6 (six) hours as needed for wheezing or shortness of breath.   aspirin 81 MG chewable tablet Commonly known as: Aspirin Childrens Chew 1 tablet (81 mg total) by mouth 2 (two) times daily with a meal. Replaces: aspirin EC 81 MG tablet   Breztri Aerosphere 160-9-4.8 MCG/ACT Aero Generic drug:  Budeson-Glycopyrrol-Formoterol Inhale 2 puffs into the lungs 2 (two) times daily.   cyanocobalamin 1000 MCG/ML injection Commonly known as: VITAMIN B12 Inject 1,000 mcg into the muscle every 30 (thirty) days.   docusate sodium 100 MG capsule Commonly known as: Colace Take 1 capsule (100 mg total) by mouth 2 (two) times daily.   ezetimibe 10 MG tablet Commonly known as: ZETIA Take 10 mg by mouth at bedtime.   fluticasone 50 MCG/ACT nasal spray Commonly known as: FLONASE Place 1 spray into both nostrils daily as needed for allergies or rhinitis.   HYDROmorphone 2 MG tablet Commonly known as: Dilaudid Take 0.5 tablets (1 mg total) by mouth every 4 (four) hours as needed for  up to 7 days for severe pain.   Jardiance 25 MG Tabs tablet Generic drug: empagliflozin Take 25 mg by mouth daily.   methocarbamol 500 MG tablet Commonly known as: ROBAXIN Take 1 tablet (500 mg total) by mouth every 6 (six) hours as needed for muscle spasms.   nitroGLYCERIN 0.4 MG SL tablet Commonly known as: Nitrostat Place 1 tablet (0.4 mg total) under the tongue every 5 (five) minutes as needed for chest pain.   nystatin powder Commonly known as: MYCOSTATIN/NYSTOP Apply 1 application  topically 2 (two) times daily as needed (irritation).   ondansetron 4 MG tablet Commonly known as: Zofran Take 1 tablet (4 mg total) by mouth every 8 (eight) hours as needed for nausea or vomiting.   polyethylene glycol 17 g packet Commonly known as: MiraLax Take 17 g by mouth daily as needed for mild constipation or moderate constipation.   rosuvastatin 40 MG tablet Commonly known as: CRESTOR Take 40 mg by mouth daily.   senna 8.6 MG Tabs tablet Commonly known as: SENOKOT Take 2 tablets (17.2 mg total) by mouth at bedtime for 15 days.   Trulicity 3 GY/6.9SW Sopn Generic drug: Dulaglutide Inject 3 mg into the skin once a week. Friday               Discharge Care Instructions  (From admission,  onward)           Start     Ordered   03/02/22 0000  Weight bearing as tolerated        03/02/22 0748   03/02/22 0000  Change dressing       Comments: Do not remove your dressing.   03/02/22 0748              WEIGHT BEARING   Weight bearing as tolerated with assist device (walker, cane, etc) as directed, use it as long as suggested by your surgeon or therapist, typically at least 4-6 weeks.   EXERCISES  Results after joint replacement surgery are often greatly improved when you follow the exercise, range of motion and muscle strengthening exercises prescribed by your doctor. Safety measures are also important to protect the joint from further injury. Any time any of these exercises cause you to have increased pain or swelling, decrease what you are doing until you are comfortable again and then slowly increase them. If you have problems or questions, call your caregiver or physical therapist for advice.   Rehabilitation is important following a joint replacement. After just a few days of immobilization, the muscles of the leg can become weakened and shrink (atrophy).  These exercises are designed to build up the tone and strength of the thigh and leg muscles and to improve motion. Often times heat used for twenty to thirty minutes before working out will loosen up your tissues and help with improving the range of motion but do not use heat for the first two weeks following surgery (sometimes heat can increase post-operative swelling).   These exercises can be done on a training (exercise) mat, on the floor, on a table or on a bed. Use whatever works the best and is most comfortable for you.    Use music or television while you are exercising so that the exercises are a pleasant break in your day. This will make your life better with the exercises acting as a break in your routine that you can look forward to.   Perform all exercises about fifteen times, three times per day  or as  directed.  You should exercise both the operative leg and the other leg as well.  Exercises include:   Quad Sets - Tighten up the muscle on the front of the thigh (Quad) and hold for 5-10 seconds.   Straight Leg Raises - With your knee straight (if you were given a brace, keep it on), lift the leg to 60 degrees, hold for 3 seconds, and slowly lower the leg.  Perform this exercise against resistance later as your leg gets stronger.  Leg Slides: Lying on your back, slowly slide your foot toward your buttocks, bending your knee up off the floor (only go as far as is comfortable). Then slowly slide your foot back down until your leg is flat on the floor again.  Angel Wings: Lying on your back spread your legs to the side as far apart as you can without causing discomfort.  Hamstring Strength:  Lying on your back, push your heel against the floor with your leg straight by tightening up the muscles of your buttocks.  Repeat, but this time bend your knee to a comfortable angle, and push your heel against the floor.  You may put a pillow under the heel to make it more comfortable if necessary.   A rehabilitation program following joint replacement surgery can speed recovery and prevent re-injury in the future due to weakened muscles. Contact your doctor or a physical therapist for more information on knee rehabilitation.    CONSTIPATION  Constipation is defined medically as fewer than three stools per week and severe constipation as less than one stool per week.  Even if you have a regular bowel pattern at home, your normal regimen is likely to be disrupted due to multiple reasons following surgery.  Combination of anesthesia, postoperative narcotics, change in appetite and fluid intake all can affect your bowels.   YOU MUST use at least one of the following options; they are listed in order of increasing strength to get the job done.  They are all available over the counter, and you may need to use some,  POSSIBLY even all of these options:    Drink plenty of fluids (prune juice may be helpful) and high fiber foods Colace 100 mg by mouth twice a day  Senokot for constipation as directed and as needed Dulcolax (bisacodyl), take with full glass of water  Miralax (polyethylene glycol) once or twice a day as needed.  If you have tried all these things and are unable to have a bowel movement in the first 3-4 days after surgery call either your surgeon or your primary doctor.    If you experience loose stools or diarrhea, hold the medications until you stool forms back up.  If your symptoms do not get better within 1 week or if they get worse, check with your doctor.  If you experience "the worst abdominal pain ever" or develop nausea or vomiting, please contact the office immediately for further recommendations for treatment.   ITCHING:  If you experience itching with your medications, try taking only a single pain pill, or even half a pain pill at a time.  You can also use Benadryl over the counter for itching or also to help with sleep.   TED HOSE STOCKINGS:  Use stockings on both legs until for at least 2 weeks or as directed by physician office. They may be removed at night for sleeping.  MEDICATIONS:  See your medication summary on the "After Visit Summary" that nursing  will review with you.  You may have some home medications which will be placed on hold until you complete the course of blood thinner medication.  It is important for you to complete the blood thinner medication as prescribed.  PRECAUTIONS:  If you experience chest pain or shortness of breath - call 911 immediately for transfer to the hospital emergency department.   If you develop a fever greater that 101 F, purulent drainage from wound, increased redness or drainage from wound, foul odor from the wound/dressing, or calf pain - CONTACT YOUR SURGEON.                                                   FOLLOW-UP APPOINTMENTS:  If  you do not already have a post-op appointment, please call the office for an appointment to be seen by your surgeon.  Guidelines for how soon to be seen are listed in your "After Visit Summary", but are typically between 1-4 weeks after surgery.  OTHER INSTRUCTIONS:   Knee Replacement:  Do not place pillow under knee, focus on keeping the knee straight while resting. CPM instructions: 0-90 degrees, 2 hours in the morning, 2 hours in the afternoon, and 2 hours in the evening. Place foam block, curve side up under heel at all times except when in CPM or when walking.  DO NOT modify, tear, cut, or change the foam block in any way.   MAKE SURE YOU:  Understand these instructions.  Get help right away if you are not doing well or get worse.    Thank you for letting us be a part of your medical care team.  It is a privilege we respect greatly.  We hope these instructions will help you stay on track for a fast and full recovery!   Diagnostic Studies: DG Knee Right Port  Result Date: 03/01/2022 CLINICAL DATA:  Postop. EXAM: PORTABLE RIGHT KNEE - 1-2 VIEW COMPARISON:  None Available. FINDINGS: Status post total knee replacement. No medial hardware complication. Expected postoperative swelling and gas. No evidence of acute fracture or joint malalignment. IMPRESSION: Status post total knee replacement without immediate hardware complication. Electronically Signed   By: Margaretha Sheffield M.D.   On: 03/01/2022 14:11    Disposition: Discharge disposition: 01-Home or Self Care       Discharge Instructions     Call MD / Call 911   Complete by: As directed    If you experience chest pain or shortness of breath, CALL 911 and be transported to the hospital emergency room.  If you develope a fever above 101 F, pus (white drainage) or increased drainage or redness at the wound, or calf pain, call your surgeon's office.   Change dressing   Complete by: As directed    Do not remove your dressing.    Constipation Prevention   Complete by: As directed    Drink plenty of fluids.  Prune juice may be helpful.  You may use a stool softener, such as Colace (over the counter) 100 mg twice a day.  Use MiraLax (over the counter) for constipation as needed.   Diet - low sodium heart healthy   Complete by: As directed    Discharge instructions   Complete by: As directed    Elevate toes above nose. Use cryotherapy as needed for pain and swelling.  Do not put a pillow under the knee. Place it under the heel.   Complete by: As directed    Driving restrictions   Complete by: As directed    No driving for 6 weeks   Increase activity slowly as tolerated   Complete by: As directed    Lifting restrictions   Complete by: As directed    No lifting for 6 weeks   Post-operative opioid taper instructions:   Complete by: As directed    POST-OPERATIVE OPIOID TAPER INSTRUCTIONS: It is important to wean off of your opioid medication as soon as possible. If you do not need pain medication after your surgery it is ok to stop day one. Opioids include: Codeine, Hydrocodone(Norco, Vicodin), Oxycodone(Percocet, oxycontin) and hydromorphone amongst others.  Long term and even short term use of opiods can cause: Increased pain response Dependence Constipation Depression Respiratory depression And more.  Withdrawal symptoms can include Flu like symptoms Nausea, vomiting And more Techniques to manage these symptoms Hydrate well Eat regular healthy meals Stay active Use relaxation techniques(deep breathing, meditating, yoga) Do Not substitute Alcohol to help with tapering If you have been on opioids for less than two weeks and do not have pain than it is ok to stop all together.  Plan to wean off of opioids This plan should start within one week post op of your joint replacement. Maintain the same interval or time between taking each dose and first decrease the dose.  Cut the total daily intake of opioids  by one tablet each day Next start to increase the time between doses. The last dose that should be eliminated is the evening dose.      TED hose   Complete by: As directed    Use stockings (TED hose) for 2 weeks on both leg(s).  You may remove them at night for sleeping.   Weight bearing as tolerated   Complete by: As directed         Follow-up Information     Swinteck, Aaron Edelman, MD. Schedule an appointment as soon as possible for a visit in 2 week(s).   Specialty: Orthopedic Surgery Why: For wound re-check Contact information: [address] [place] [address] [phone]                  Signed: Charlott Rakes, PA-C 03/04/2022, 10:30 AM

## 2022-04-18 ENCOUNTER — Telehealth: Payer: Self-pay | Admitting: Cardiology

## 2022-04-18 NOTE — Telephone Encounter (Signed)
Patient returned RN's call. 

## 2022-04-18 NOTE — Telephone Encounter (Signed)
Left vm to call back

## 2022-04-18 NOTE — Telephone Encounter (Signed)
Advised to go to the ED for evaluation of chest pain. Pt verbalized understanding and had no additional questions.

## 2022-04-18 NOTE — Telephone Encounter (Signed)
Pt c/o of Chest Pain: STAT if CP now or developed within 24 hours  1. Are you having CP right now? No  2. Are you experiencing any other symptoms (ex. SOB, nausea, vomiting, sweating)? Nausea, but she thinks its because she hasn't been able to eat.   3. How long have you been experiencing CP? She states it has been going on for about 2 weeks and it hasn't gotten worse. She states it is more pressure than pain.   4. Is your CP continuous or coming and going? Coming and going   5. Have you taken Nitroglycerin? Took one last night, and that made it feel better and she was able to go to sleep.  ?  Pt had a EKG at her PCP office yesterday and it was fine. Pt states she is supposed to be going to  her brother's funeral in [place] and she wants to make sure if safe for her to travel.

## 2022-05-30 ENCOUNTER — Ambulatory Visit: Payer: PPO | Admitting: Cardiology

## 2022-08-03 DIAGNOSIS — I499 Cardiac arrhythmia, unspecified: Secondary | ICD-10-CM | POA: Insufficient documentation

## 2022-08-03 DIAGNOSIS — D649 Anemia, unspecified: Secondary | ICD-10-CM | POA: Insufficient documentation

## 2022-08-08 ENCOUNTER — Ambulatory Visit: Payer: PPO | Attending: Cardiology | Admitting: Cardiology

## 2022-08-08 ENCOUNTER — Encounter: Payer: Self-pay | Admitting: Cardiology

## 2022-08-08 VITALS — BP 110/64 | HR 80 | Ht 65.0 in | Wt 164.8 lb

## 2022-08-08 DIAGNOSIS — I2510 Atherosclerotic heart disease of native coronary artery without angina pectoris: Secondary | ICD-10-CM

## 2022-08-08 DIAGNOSIS — F17219 Nicotine dependence, cigarettes, with unspecified nicotine-induced disorders: Secondary | ICD-10-CM

## 2022-08-08 DIAGNOSIS — J431 Panlobular emphysema: Secondary | ICD-10-CM

## 2022-08-08 DIAGNOSIS — I10 Essential (primary) hypertension: Secondary | ICD-10-CM

## 2022-08-08 DIAGNOSIS — E1169 Type 2 diabetes mellitus with other specified complication: Secondary | ICD-10-CM

## 2022-08-08 DIAGNOSIS — G459 Transient cerebral ischemic attack, unspecified: Secondary | ICD-10-CM

## 2022-08-08 DIAGNOSIS — E785 Hyperlipidemia, unspecified: Secondary | ICD-10-CM

## 2022-08-08 NOTE — Patient Instructions (Signed)
Medication Instructions:  Your physician recommends that you continue on your current medications as directed. Please refer to the Current Medication list given to you today.  *If you need a refill on your cardiac medications before your next appointment, please call your pharmacy*   Lab Work: Your physician recommends that you return for lab work in: the next few days You need to have labs done when you are fasting.  You can come Monday through Friday 8:30 am to 12:00 pm and 1:15 to 4:30. You do not need to make an appointment as the order has already been placed. The labs you are going to have done are CMP and Lipids.  If you have labs (blood work) drawn today and your tests are completely normal, you will receive your results only by: [place] (if you have MyChart) OR A paper copy in the mail If you have any lab test that is abnormal or we need to change your treatment, we will call you to review the results.   Testing/Procedures: None ordered   Follow-Up: At [facility], you and your health needs are our priority.  As part of our continuing mission to provide you with exceptional heart care, we have created designated Provider Care Teams.  These Care Teams include your primary Cardiologist (physician) and Advanced Practice Providers (APPs -  Physician Assistants and Nurse Practitioners) who all work together to provide you with the care you need, when you need it.  We recommend signing up for the patient portal called "MyChart".  Sign up information is provided on this After Visit Summary.  MyChart is used to connect with patients for Virtual Visits (Telemedicine).  Patients are able to view lab/test results, encounter notes, upcoming appointments, etc.  Non-urgent messages can be sent to your provider as well.   To learn more about what you can do with MyChart, go to [email or web address].    Your next appointment:   6 month(s)  The format for your next  appointment:   In Person  Provider:   Jyl Heinz, MD    Other Instructions none  Important Information About [place]

## 2022-08-08 NOTE — Progress Notes (Signed)
Cardiology Office Note:    Date:  08/08/2022   ID:  Brenda Cochran, DOB 01-28-1951, MRN [id number]  PCP:  Rhea Bleacher, NP  Cardiologist:  Jenean Lindau, MD   Referring MD: Rhea Bleacher, NP    ASSESSMENT:    1. Coronary artery disease involving native coronary artery of native heart without angina pectoris   2. Essential hypertension   3. TIA (transient ischemic attack)   4. Panlobular emphysema ([place])   5. Cigarette nicotine dependence with nicotine-induced disorder   6. Nicotine dependence, cigarettes, with unspecified nicotine-induced disorders    PLAN:    In order of problems listed above:  Coronary artery disease: Stable angina pectoris: Secondary prevention stressed with the patient.  Importance of compliance with diet medication stressed and she vocalized understanding.  She was advised to walk at least half an hour a day 5 days a week and she tells me that she is willing to start doing this. Cigarette smoker: I spent 5 minutes with the patient discussing solely about smoking. Smoking cessation was counseled. I suggested to the patient also different medications and pharmacological interventions. Patient is keen to try stopping on its own at this time. He will get back to me if he needs any further assistance in this matter. Essential hypertension: Blood pressure stable and diet was emphasized.  Lifestyle modification urged. Mixed dyslipidemia: On lipid-lowering medications.  She will be going back in the next few days for blood work. Diabetes mellitus: Managed by primary care.  Diet was emphasized.  Lifestyle modification distress of diabetes explained and she promises to do better. Patient will be seen in follow-up appointment in 6 months or earlier if the patient has any concerns    Medication Adjustments/Labs and Tests Ordered: Current medicines are reviewed at length with the patient today.  Concerns regarding medicines are outlined above.  No orders of the  defined types were placed in this encounter.  No orders of the defined types were placed in this encounter.    No chief complaint on file.    History of Present Illness:    Brenda Cochran is a 72 y.o. female.  Patient has past medical history of coronary artery disease, essential hypertension, mixed dyslipidemia, diabetes mellitus and unfortunately continues to smoke heavily.  She smokes more than 2 packs a day on a daily basis.  No chest pain orthopnea or PND.  At the time of my evaluation, the patient is alert awake oriented and in no distress.  Past Medical History:  Diagnosis Date   Anemia    years ago per pt   Angina pectoris ([place]) 01/10/2019   Anxiety    Asthma    Asthma, chronic obstructive, with acute exacerbation ([place])    [name] 01/13/2014  FEV1 76% predicted FVC 77% predicted FEV1 to FVC ratio 100% predicted FEF 25 7569% predicted CXR 01/13/14: NAD Alpha one antitrypsin 156  IgE 56 sl elevated.  RAST neg     Atherosclerotic heart disease of native coronary artery without angina pectoris    CAD (coronary artery disease) 09/23/2018   Chronic fatigue syndrome 12/22/2020   Chronic maxillary sinusitis    Chronic tension-type headache, intractable 08/07/2019   Cigarette nicotine dependence with nicotine-induced disorder 08/18/2019   Continuous dependence on cigarette smoking 06/09/2019   COPD (chronic obstructive pulmonary disease) (HCC)    COPD mixed type ([place]) 11/09/2020   COPD with acute exacerbation ([place]) 11/10/2020   Coronary artery disease involving native coronary artery of native  heart without angina pectoris 07/25/2018   Cough 10/29/2019   Depression    Dyslipidemia associated with type 2 diabetes mellitus ([place]) 08/23/2018   Dysrhythmia    Essential hypertension 07/18/2018   Family history of colonic polyps    Fibromyalgia    GERD (gastroesophageal reflux disease)    Headache    History of colon polyps    Hyperlipidemia    Hypertensive heart disease without heart  failure    Hypothyroidism (acquired)    Loculated pleural effusion 12/30/2019   Major depressive disorder, single episode, mild (HCC)    Menopausal and female climacteric states    Mixed hyperlipidemia    Nicotine dependence, cigarettes, with unspecified nicotine-induced disorders    NSTEMI (non-ST elevated myocardial infarction) ([place]) 07/12/2018   Numbness and tingling in left upper extremity    Obesity 12/22/2020   OSA (obstructive sleep apnea) 01/13/2014   Sleep study 02/19/14 : AHI 5.5.  Mild sleep apnea.  Lowest SpO2 81%    Osteoarthritis of right knee 03/01/2022   Overweight    Pain in right knee 07/04/2017   Palpitations 08/15/2019   Pleural effusion, not elsewhere classified 12/30/2019   Pneumonia due to COVID-19 virus 01/06/2020   Preoperative cardiovascular examination 08/30/2021   Primary osteoarthritis    RA (rheumatoid arthritis) ([place])    [name]   Shortness of breath 10/29/2019   Skin cancer    face   Sleep apnea    cpap   TIA (transient ischemic attack) 09/14/2016   Type 2 diabetes mellitus ([place])    Type 2 diabetes mellitus with diabetic nephropathy (HCC)    Vitamin D deficiency, unspecified     Past Surgical History:  Procedure Laterality Date   ABDOMINAL HYSTERECTOMY  06/05/1982   BREAST LUMPECTOMY  06/05/1974   left, benign   CARPAL TUNNEL RELEASE Right 1998   CHOLECYSTECTOMY  09/05/2010   COLONOSCOPY  02/07/2013   Colon polyp-status post polypectomy. Small internal hemorrhoids. Otherwise grossly normal colonoscopy   CORONARY STENT INTERVENTION N/A 07/12/2018   Procedure: CORONARY STENT INTERVENTION;  Surgeon: Jettie Booze, MD;  Location: [facility];  Service: Cardiovascular;  Laterality: N/A;   CORONARY THROMBECTOMY N/A 07/12/2018   Procedure: Coronary Thrombectomy;  Surgeon: Jettie Booze, MD;  Location: [facility];  Service: Cardiovascular;  Laterality: N/A;   CYST EXCISION Left 1980   foot    ESOPHAGOGASTRODUODENOSCOPY  03/26/2017   Moderate gastritis. Otherwise normal EGD   HEEL SPUR SURGERY     foot for spurs   INTRAVASCULAR PRESSURE WIRE/FFR STUDY N/A 01/21/2019   Procedure: INTRAVASCULAR PRESSURE WIRE/FFR STUDY;  Surgeon: Jettie Booze, MD;  Location: [facility];  Service: Cardiovascular;  Laterality: N/A;   INTRAVASCULAR PRESSURE WIRE/FFR STUDY N/A 09/16/2021   Procedure: INTRAVASCULAR PRESSURE WIRE/FFR STUDY;  Surgeon: Belva Crome, MD;  Location: [facility];  Service: Cardiovascular;  Laterality: N/A;   KNEE ARTHROPLASTY Right 03/01/2022   Procedure: COMPUTER ASSISTED TOTAL KNEE ARTHROPLASTY;  Surgeon: Rod Can, MD;  Location: [facility];  Service: Orthopedics;  Laterality: Right;  150   LEFT HEART CATH AND CORONARY ANGIOGRAPHY N/A 07/12/2018   Procedure: LEFT HEART CATH AND CORONARY ANGIOGRAPHY;  Surgeon: Jettie Booze, MD;  Location: [facility];  Service: Cardiovascular;  Laterality: N/A;   LEFT HEART CATH AND CORONARY ANGIOGRAPHY N/A 01/21/2019   Procedure: LEFT HEART CATH AND CORONARY ANGIOGRAPHY;  Surgeon: Jettie Booze, MD;  Location: [facility];  Service:  Cardiovascular;  Laterality: N/A;   LEFT HEART CATH AND CORONARY ANGIOGRAPHY N/A 09/16/2021   Procedure: LEFT HEART CATH AND CORONARY ANGIOGRAPHY;  Surgeon: Belva Crome, MD;  Location: [facility];  Service: Cardiovascular;  Laterality: N/A;   TONSILLECTOMY     adenoids    Current Medications: Current Meds  Medication Sig   albuterol (PROVENTIL) (2.5 MG/3ML) 0.083% nebulizer solution Take 3 mLs (2.5 mg total) by nebulization every 6 (six) hours as needed for wheezing or shortness of breath.   albuterol (VENTOLIN HFA) 108 (90 Base) MCG/ACT inhaler Inhale 1-2 puffs into the lungs every 6 (six) hours as needed for wheezing or shortness of breath.   Budeson-Glycopyrrol-Formoterol (BREZTRI AEROSPHERE) 160-9-4.8 MCG/ACT AERO Inhale 2 puffs into the lungs  2 (two) times daily.   ezetimibe (ZETIA) 10 MG tablet Take 10 mg by mouth at bedtime.   fluticasone (FLONASE) 50 MCG/ACT nasal spray Place 1 spray into both nostrils daily as needed for allergies or rhinitis.   JARDIANCE 25 MG TABS tablet Take 25 mg by mouth daily.   metoprolol tartrate (LOPRESSOR) 25 MG tablet Take 25 mg by mouth daily.   nitroGLYCERIN (NITROSTAT) 0.4 MG SL tablet Place 1 tablet (0.4 mg total) under the tongue every 5 (five) minutes as needed for chest pain.   nystatin (MYCOSTATIN/NYSTOP) powder Apply 1 application  topically 2 (two) times daily as needed (irritation).   omeprazole (PRILOSEC) 40 MG capsule Take 40 mg by mouth daily.   rosuvastatin (CRESTOR) 40 MG tablet Take 40 mg by mouth daily.   TRULICITY 1.5 0000000 SOPN Inject 1.5 mg into the skin once a week.     Allergies:   Shellfish allergy, Contrast media [iodinated contrast media], Metformin and related, Duloxetine, Oxycodone, Amitriptyline hcl, Cymbalta [duloxetine hcl], Iohexol, Oxycontin [oxycodone hcl], and Ultram [tramadol]   Social History   Socioeconomic History   Marital status: Married    Spouse name: Not on file   Number of children: 6   Years of education: Not on file   Highest education level: Not on file  Occupational History   Occupation: disabled    Comment: jockey  Tobacco Use   Smoking status: Former    Packs/day: 4.00    Years: 30.00    Total pack years: 120.00    Types: Cigarettes   Smokeless tobacco: Never   Tobacco comments:    Quit 2 weeks ago, 11/15/2020  Vaping Use   Vaping Use: Never used  Substance and Sexual Activity   Alcohol use: No   Drug use: No   Sexual activity: Not on file  Other Topics Concern   Not on file  Social History Narrative   Lives with Eleonore Guindon husband.   Disabled   Social Determinants of Health   Financial Resource Strain: Not on file  Food Insecurity: No Food Insecurity (03/01/2022)   Hunger Vital Sign    Worried About Running Out of Food in  the Last Year: Never true    Ran Out of Food in the Last Year: Never true  Transportation Needs: No Transportation Needs (03/01/2022)   PRAPARE - Hydrologist (Medical): No    Lack of Transportation (Non-Medical): No  Physical Activity: Not on file  Stress: Not on file  Social Connections: Not on file     Family History: The patient's family history includes Bone cancer in her father; Breast cancer in her sister; CAD in an other family member; COPD in her brother; Diabetes  in an other family member; Hypertension in an other family member; Lung cancer in her father; Lung disease in her maternal uncle, maternal uncle, and mother; Lymphoma in her sister; Multiple sclerosis in her brother; Muscular dystrophy in her brother; Seizures in an other family member. There is no history of Colon cancer, Colon polyps, Esophageal cancer, Stomach cancer, or Rectal cancer.  ROS:   Please see the history of present illness.    All other systems reviewed and are negative.  EKGs/Labs/Other Studies Reviewed:    The following studies were reviewed today: I discussed my findings with the patient at length.   Recent Labs: 09/13/2021: ALT 29 03/02/2022: BUN 11; Creatinine, Ser 0.78; Hemoglobin 11.3; Platelets 216; Potassium 4.2; Sodium 135  Recent Lipid Panel    Component Value Date/Time   CHOL 214 (H) 06/18/2020 0925   TRIG 268 (H) 06/18/2020 0925   HDL 42 06/18/2020 0925   CHOLHDL 5.1 (H) 06/18/2020 0925   CHOLHDL 5.7 07/12/2018 0333   VLDL 29 07/12/2018 0333   LDLCALC 125 (H) 06/18/2020 0925    Physical Exam:    VS:  BP 110/64   Pulse 80   Ht '5\' 5"'$  (1.651 m)   Wt 164 lb 12.8 oz (74.8 kg)   SpO2 95%   BMI 27.42 kg/m     Wt Readings from Last 3 Encounters:  08/08/22 164 lb 12.8 oz (74.8 kg)  03/01/22 171 lb 15.3 oz (78 kg)  02/20/22 172 lb (78 kg)     GEN: Patient is in no acute distress HEENT: Normal NECK: No JVD; No carotid bruits LYMPHATICS: No  lymphadenopathy CARDIAC: Hear sounds regular, 2/6 systolic murmur at the apex. RESPIRATORY:  Clear to auscultation without rales, wheezing or rhonchi  ABDOMEN: Soft, non-tender, non-distended MUSCULOSKELETAL:  No edema; No deformity  SKIN: Warm and dry NEUROLOGIC:  Alert and oriented x 3 PSYCHIATRIC:  Normal affect   Signed, Jenean Lindau, MD  08/08/2022 9:37 AM    [place]

## 2023-08-08 ENCOUNTER — Encounter: Payer: Self-pay | Admitting: Cardiology

## 2023-08-08 ENCOUNTER — Ambulatory Visit: Attending: Cardiology | Admitting: Cardiology

## 2023-08-08 VITALS — BP 120/84 | HR 112 | Ht 65.0 in | Wt 150.2 lb

## 2023-08-08 DIAGNOSIS — J431 Panlobular emphysema: Secondary | ICD-10-CM | POA: Diagnosis not present

## 2023-08-08 DIAGNOSIS — I25119 Atherosclerotic heart disease of native coronary artery with unspecified angina pectoris: Secondary | ICD-10-CM | POA: Diagnosis not present

## 2023-08-08 DIAGNOSIS — G4733 Obstructive sleep apnea (adult) (pediatric): Secondary | ICD-10-CM | POA: Diagnosis not present

## 2023-08-08 DIAGNOSIS — I209 Angina pectoris, unspecified: Secondary | ICD-10-CM

## 2023-08-08 DIAGNOSIS — I2510 Atherosclerotic heart disease of native coronary artery without angina pectoris: Secondary | ICD-10-CM

## 2023-08-08 DIAGNOSIS — I10 Essential (primary) hypertension: Secondary | ICD-10-CM | POA: Diagnosis not present

## 2023-08-08 DIAGNOSIS — E782 Mixed hyperlipidemia: Secondary | ICD-10-CM

## 2023-08-08 MED ORDER — NITROGLYCERIN 0.4 MG SL SUBL: 0.4000 mg | SUBLINGUAL_TABLET | SUBLINGUAL | 3 refills | Status: AC | PRN

## 2023-08-08 MED ORDER — METOPROLOL TARTRATE 25 MG PO TABS: 25.0000 mg | ORAL_TABLET | Freq: Every day | ORAL | 1 refills | Status: DC

## 2023-08-08 MED ORDER — PREDNISONE 50 MG PO TABS: ORAL_TABLET | ORAL | 0 refills | Status: DC

## 2023-08-08 NOTE — Progress Notes (Signed)
 Cardiology Office Note:    Date:  08/08/2023   ID:  Brenda Cochran, DOB Jun 23, 1950, MRN [id number]  PCP:  Erskine Emery, NP  Cardiologist:  Garwin Brothers, MD   Referring MD: Erskine Emery, NP    ASSESSMENT:    1. Essential hypertension   2. Coronary artery disease involving native coronary artery of native heart without angina pectoris   3. Panlobular emphysema (HCC)   4. OSA (obstructive sleep apnea)   5. Mixed hyperlipidemia   6. Angina pectoris (HCC)    PLAN:    In order of problems listed above:  Coronary artery disease: Angina pectoris: I discussed my findings with the patient at length.  Coronary angiography report from the past was reviewed.  She has significant disease.  Her symptoms are concerning.  Following recommendations were done to the patient.  Sublingual nitroglycerin prescription was sent, its protocol and 911 protocol explained and the patient vocalized understanding questions were answered to the patient's satisfaction.I discussed coronary angiography and left heart catheterization with the patient at extensive length. Procedure, benefits and potential risks were explained. Patient had multiple questions which were answered to the patient's satisfaction. Patient agreed and consented for the procedure. Further recommendations will be made based on the findings of the coronary angiography. In the interim. The patient has any significant symptoms he knows to go to the nearest emergency room. Essential hypertension: Blood pressure stable and diet was emphasized.  She was emphasized to continue beta-blocker started by primary care. Mixed dyslipidemia: She took herself off of cholesterol medicines.  Now her primary care has reinstituted medications.  Compliance urged.  Diet emphasized.  Lipids will be followed by primary care.  Goal LDL less than 60. Cigarette smoker: I spent 5 minutes with the patient discussing solely about smoking. Smoking cessation was counseled. I  suggested to the patient also different medications and pharmacological interventions. Patient is keen to try stopping on its own at this time. He will get back to me if he needs any further assistance in this matter. Patient will be seen in follow-up appointment in 6 months or earlier if the patient has any concerns.    Medication Adjustments/Labs and Tests Ordered: Current medicines are reviewed at length with the patient today.  Concerns regarding medicines are outlined above.  Orders Placed This Encounter  Procedures   EKG 12-Lead   No orders of the defined types were placed in this encounter.    Chief Complaint  Patient presents with   Medication Management     History of Present Illness:    Brenda Cochran is a 73 y.o. female.  Patient has past medical history of coronary artery disease, essential hypertension, mixed dyslipidemia and unfortunately continues to smoke.  She has had blood work done by primary care recently.  She mentions to me that she exerts and her heart rate goes up and she feels tight in the chest.  This concerns her and limits her activity.  Unfortunately she smokes heavily.  At the time of my evaluation, the patient is alert awake oriented and in no distress.  Past Medical History:  Diagnosis Date   Anemia    years ago per pt   Angina pectoris (HCC) 01/10/2019   Anxiety    Asthma    Asthma, chronic obstructive, with acute exacerbation (HCC)    Cleda Daub 01/13/2014  FEV1 76% predicted FVC 77% predicted FEV1 to FVC ratio 100% predicted FEF 25 7569% predicted CXR 01/13/14: NAD Alpha one  antitrypsin 156  IgE 56 sl elevated.  RAST neg     Atherosclerotic heart disease of native coronary artery without angina pectoris    CAD (coronary artery disease) 09/23/2018   Chronic fatigue syndrome 12/22/2020   Chronic maxillary sinusitis    Chronic tension-type headache, intractable 08/07/2019   Cigarette nicotine dependence with nicotine-induced disorder 08/18/2019    Continuous dependence on cigarette smoking 06/09/2019   COPD (chronic obstructive pulmonary disease) (HCC)    COPD mixed type (HCC) 11/09/2020   COPD with acute exacerbation (HCC) 11/10/2020   Coronary artery disease involving native coronary artery of native heart without angina pectoris 07/25/2018   Cough 10/29/2019   Depression    Dyslipidemia associated with type 2 diabetes mellitus (HCC) 08/23/2018   Dysrhythmia    Essential hypertension 07/18/2018   Family history of colonic polyps    Fibromyalgia    GERD (gastroesophageal reflux disease)    Headache    History of colon polyps    Hyperlipidemia    Hypertensive heart disease without heart failure    Hypothyroidism (acquired)    Loculated pleural effusion 12/30/2019   Major depressive disorder, single episode, mild (HCC)    Menopausal and female climacteric states    Mixed hyperlipidemia    Nicotine dependence, cigarettes, with unspecified nicotine-induced disorders    NSTEMI (non-ST elevated myocardial infarction) (HCC) 07/12/2018   Numbness and tingling in left upper extremity    Obesity 12/22/2020   OSA (obstructive sleep apnea) 01/13/2014   Sleep study 02/19/14 : AHI 5.5.  Mild sleep apnea.  Lowest SpO2 81%    Osteoarthritis of right knee 03/01/2022   Overweight    Pain in right knee 07/04/2017   Palpitations 08/15/2019   Pleural effusion, not elsewhere classified 12/30/2019   Pneumonia due to COVID-19 virus 01/06/2020   Preoperative cardiovascular examination 08/30/2021   Primary osteoarthritis    RA (rheumatoid arthritis) (HCC)    Azzie Roup   Shortness of breath 10/29/2019   Skin cancer    face   Sleep apnea    cpap   TIA (transient ischemic attack) 09/14/2016   Type 2 diabetes mellitus (HCC)    Type 2 diabetes mellitus with diabetic nephropathy (HCC)    Vitamin D deficiency, unspecified     Past Surgical History:  Procedure Laterality Date   ABDOMINAL HYSTERECTOMY  06/05/1982   BREAST LUMPECTOMY   06/05/1974   left, benign   CARPAL TUNNEL RELEASE Right 1998   CHOLECYSTECTOMY  09/05/2010   COLONOSCOPY  02/07/2013   Colon polyp-status post polypectomy. Small internal hemorrhoids. Otherwise grossly normal colonoscopy   CORONARY PRESSURE/FFR STUDY N/A 01/21/2019   Procedure: INTRAVASCULAR PRESSURE WIRE/FFR STUDY;  Surgeon: Corky Crafts, MD;  Location: [facility] INVASIVE CV LAB;  Service: Cardiovascular;  Laterality: N/A;   CORONARY PRESSURE/FFR STUDY N/A 09/16/2021   Procedure: INTRAVASCULAR PRESSURE WIRE/FFR STUDY;  Surgeon: Lyn Records, MD;  Location: [facility] INVASIVE CV LAB;  Service: Cardiovascular;  Laterality: N/A;   CORONARY STENT INTERVENTION N/A 07/12/2018   Procedure: CORONARY STENT INTERVENTION;  Surgeon: Corky Crafts, MD;  Location: [facility] INVASIVE CV LAB;  Service: Cardiovascular;  Laterality: N/A;   CORONARY THROMBECTOMY N/A 07/12/2018   Procedure: Coronary Thrombectomy;  Surgeon: Corky Crafts, MD;  Location: [facility] INVASIVE CV LAB;  Service: Cardiovascular;  Laterality: N/A;   CYST EXCISION Left 1980   foot   ESOPHAGOGASTRODUODENOSCOPY  03/26/2017   Moderate gastritis. Otherwise normal EGD   HEEL SPUR SURGERY     foot for spurs  KNEE ARTHROPLASTY Right 03/01/2022   Procedure: COMPUTER ASSISTED TOTAL KNEE ARTHROPLASTY;  Surgeon: Samson Frederic, MD;  Location: [facility] ORS;  Service: Orthopedics;  Laterality: Right;  150   LEFT HEART CATH AND CORONARY ANGIOGRAPHY N/A 07/12/2018   Procedure: LEFT HEART CATH AND CORONARY ANGIOGRAPHY;  Surgeon: Corky Crafts, MD;  Location: [facility] INVASIVE CV LAB;  Service: Cardiovascular;  Laterality: N/A;   LEFT HEART CATH AND CORONARY ANGIOGRAPHY N/A 01/21/2019   Procedure: LEFT HEART CATH AND CORONARY ANGIOGRAPHY;  Surgeon: Corky Crafts, MD;  Location: [facility] INVASIVE CV LAB;  Service: Cardiovascular;  Laterality: N/A;   LEFT HEART CATH AND CORONARY ANGIOGRAPHY N/A 09/16/2021   Procedure: LEFT HEART CATH AND CORONARY ANGIOGRAPHY;   Surgeon: Lyn Records, MD;  Location: [facility] INVASIVE CV LAB;  Service: Cardiovascular;  Laterality: N/A;   TONSILLECTOMY     adenoids    Current Medications: Current Meds  Medication Sig   albuterol (PROVENTIL) (2.5 MG/3ML) 0.083% nebulizer solution Take 3 mLs (2.5 mg total) by nebulization every 6 (six) hours as needed for wheezing or shortness of breath.   albuterol (VENTOLIN HFA) 108 (90 Base) MCG/ACT inhaler Inhale 1-2 puffs into the lungs every 6 (six) hours as needed for wheezing or shortness of breath.   Budeson-Glycopyrrol-Formoterol (BREZTRI AEROSPHERE) 160-9-4.8 MCG/ACT AERO Inhale 2 puffs into the lungs 2 (two) times daily.   ezetimibe (ZETIA) 10 MG tablet Take 10 mg by mouth at bedtime.   fluticasone (FLONASE) 50 MCG/ACT nasal spray Place 1 spray into both nostrils daily as needed for allergies or rhinitis.   JARDIANCE 25 MG TABS tablet Take 25 mg by mouth daily.   metoprolol tartrate (LOPRESSOR) 25 MG tablet Take 25 mg by mouth daily.   nitroGLYCERIN (NITROSTAT) 0.4 MG SL tablet Place 1 tablet (0.4 mg total) under the tongue every 5 (five) minutes as needed for chest pain.   nystatin (MYCOSTATIN/NYSTOP) powder Apply 1 application  topically 2 (two) times daily as needed (irritation).   omeprazole (PRILOSEC) 40 MG capsule Take 40 mg by mouth daily.   rosuvastatin (CRESTOR) 40 MG tablet Take 40 mg by mouth daily.   TRULICITY 1.5 MG/0.5ML SOPN Inject 1.5 mg into the skin once a week.     Allergies:   Shellfish allergy, Contrast media [iodinated contrast media], Metformin and related, Duloxetine, Oxycodone, Amitriptyline hcl, Cymbalta [duloxetine hcl], Iohexol, Oxycontin [oxycodone hcl], and Ultram [tramadol]   Social History   Socioeconomic History   Marital status: Married    Spouse name: Not on file   Number of children: 6   Years of education: Not on file   Highest education level: Not on file  Occupational History   Occupation: disabled    Comment: jockey  Tobacco Use    Smoking status: Former    Current packs/day: 4.00    Average packs/day: 4.0 packs/day for 30.0 years (120.0 ttl pk-yrs)    Types: Cigarettes   Smokeless tobacco: Never   Tobacco comments:    Quit 2 weeks ago, 11/15/2020  Vaping Use   Vaping status: Never Used  Substance and Sexual Activity   Alcohol use: No   Drug use: No   Sexual activity: Not on file  Other Topics Concern   Not on file  Social History Narrative   Lives with Orlene Salmons husband.   Disabled   Social Drivers of Corporate investment banker Strain: Not on file  Food Insecurity: No Food Insecurity (03/01/2022)   Hunger Vital Sign    Worried  About Running Out of Food in the Last Year: Never true    Ran Out of Food in the Last Year: Never true  Transportation Needs: No Transportation Needs (03/01/2022)   PRAPARE - Administrator, Civil Service (Medical): No    Lack of Transportation (Non-Medical): No  Physical Activity: Not on file  Stress: Not on file  Social Connections: Not on file     Family History: The patient's family history includes Bone cancer in her father; Breast cancer in her sister; CAD in an other family member; COPD in her brother; Diabetes in an other family member; Hypertension in an other family member; Lung cancer in her father; Lung disease in her maternal uncle, maternal uncle, and mother; Lymphoma in her sister; Multiple sclerosis in her brother; Muscular dystrophy in her brother; Seizures in an other family member. There is no history of Colon cancer, Colon polyps, Esophageal cancer, Stomach cancer, or Rectal cancer.  ROS:   Please see the history of present illness.    All other systems reviewed and are negative.  EKGs/Labs/Other Studies Reviewed:    The following studies were reviewed today: .Marland KitchenEKG Interpretation Date/Time:  Wednesday August 08 2023 10:26:44 EST Ventricular Rate:  112 PR Interval:  140 QRS Duration:  76 QT Interval:  328 QTC Calculation: 447 R  Axis:   -15  Text Interpretation: Sinus tachycardia Otherwise normal ECG When compared with ECG of 16-Sep-2021 15:25, No significant change was found Confirmed by Belva Crome [phone]) on 08/08/2023 10:42:47 AM     Recent Labs: No results found for requested labs within last 365 days.  Recent Lipid Panel    Component Value Date/Time   CHOL 214 (H) 06/18/2020 0925   TRIG 268 (H) 06/18/2020 0925   HDL 42 06/18/2020 0925   CHOLHDL 5.1 (H) 06/18/2020 0925   CHOLHDL 5.7 07/12/2018 0333   VLDL 29 07/12/2018 0333   LDLCALC 125 (H) 06/18/2020 0925    Physical Exam:    VS:  BP 120/84 (BP Location: Right Arm, Patient Position: Sitting)   Pulse (!) 112   Ht 5\' 5"  (1.651 m)   Wt 150 lb 3.2 oz (68.1 kg)   SpO2 93%   BMI 24.99 kg/m     Wt Readings from Last 3 Encounters:  08/08/23 150 lb 3.2 oz (68.1 kg)  08/08/22 164 lb 12.8 oz (74.8 kg)  03/01/22 171 lb 15.3 oz (78 kg)     GEN: Patient is in no acute distress HEENT: Normal NECK: No JVD; No carotid bruits LYMPHATICS: No lymphadenopathy CARDIAC: Hear sounds regular, 2/6 systolic murmur at the apex. RESPIRATORY:  Clear to auscultation without rales, wheezing or rhonchi  ABDOMEN: Soft, non-tender, non-distended MUSCULOSKELETAL:  No edema; No deformity  SKIN: Warm and dry NEUROLOGIC:  Alert and oriented x 3 PSYCHIATRIC:  Normal affect   Signed, Garwin Brothers, MD  08/08/2023 10:58 AM    [facility]

## 2023-08-08 NOTE — Patient Instructions (Addendum)
  [facility] A DEPT OF [facility] AT [place] [address] [place] [address] Dept: [phone] Loc: [phone]  AYN DOMANGUE  08/08/2023  You are scheduled for a Cardiac Catheterization on Tuesday, March 11 with Dr. Peter [place].  1. Please arrive at the [facility] (Main Entrance A) at [facility]: [address] [place], [place] [address] at 7:00 AM (This time is 2 hour(s) before your procedure to ensure your preparation).   Free valet parking service is available. You will check in at ADMITTING. The support person will be asked to wait in the waiting room.  It is OK to have someone drop you off and come back when you are ready to be discharged.    Special note: Every effort is made to have your procedure done on time. Please understand that emergencies sometimes delay scheduled procedures.  2. Diet: Do not eat solid foods after midnight.  The patient may have clear liquids until 5am upon the day of the procedure.  3. Labs:   4. Medication instructions in preparation for your procedure:   Contrast Allergy: Yes, Please take Prednisone 50mg  by mouth at: Thirteen hours prior to cath 8:00pm on Monday Seven hours prior to cath 2:00 am Tuesday  And prior to leaving home please take last dose of Prednisone 50mg  and Benadryl 50mg  by mouth.   On the morning of your procedure, take your Aspirin 81 mg and any morning medicines NOT listed above.  You may use sips of water.  5. Plan to go home the same day, you will only stay overnight if medically necessary. 6. Bring a current list of your medications and current insurance cards. 7. You MUST have a responsible person to drive you home. 8. Someone MUST be with you the first 24 hours after you arrive home or your discharge will be delayed. 9. Please wear clothes that are easy to get on and off and wear slip-on shoes.  Thank you for allowing [place] to care for you!   --  [facility]

## 2023-08-08 NOTE — H&P (View-Only) (Signed)
 Cardiology Office Note:    Date:  08/08/2023   ID:  Brenda Cochran, DOB Jun 23, 1950, MRN [id number]  PCP:  Erskine Emery, NP  Cardiologist:  Garwin Brothers, MD   Referring MD: Erskine Emery, NP    ASSESSMENT:    1. Essential hypertension   2. Coronary artery disease involving native coronary artery of native heart without angina pectoris   3. Panlobular emphysema (HCC)   4. OSA (obstructive sleep apnea)   5. Mixed hyperlipidemia   6. Angina pectoris (HCC)    PLAN:    In order of problems listed above:  Coronary artery disease: Angina pectoris: I discussed my findings with the patient at length.  Coronary angiography report from the past was reviewed.  She has significant disease.  Her symptoms are concerning.  Following recommendations were done to the patient.  Sublingual nitroglycerin prescription was sent, its protocol and 911 protocol explained and the patient vocalized understanding questions were answered to the patient's satisfaction.I discussed coronary angiography and left heart catheterization with the patient at extensive length. Procedure, benefits and potential risks were explained. Patient had multiple questions which were answered to the patient's satisfaction. Patient agreed and consented for the procedure. Further recommendations will be made based on the findings of the coronary angiography. In the interim. The patient has any significant symptoms he knows to go to the nearest emergency room. Essential hypertension: Blood pressure stable and diet was emphasized.  She was emphasized to continue beta-blocker started by primary care. Mixed dyslipidemia: She took herself off of cholesterol medicines.  Now her primary care has reinstituted medications.  Compliance urged.  Diet emphasized.  Lipids will be followed by primary care.  Goal LDL less than 60. Cigarette smoker: I spent 5 minutes with the patient discussing solely about smoking. Smoking cessation was counseled. I  suggested to the patient also different medications and pharmacological interventions. Patient is keen to try stopping on its own at this time. He will get back to me if he needs any further assistance in this matter. Patient will be seen in follow-up appointment in 6 months or earlier if the patient has any concerns.    Medication Adjustments/Labs and Tests Ordered: Current medicines are reviewed at length with the patient today.  Concerns regarding medicines are outlined above.  Orders Placed This Encounter  Procedures   EKG 12-Lead   No orders of the defined types were placed in this encounter.    Chief Complaint  Patient presents with   Medication Management     History of Present Illness:    Brenda Cochran is a 73 y.o. female.  Patient has past medical history of coronary artery disease, essential hypertension, mixed dyslipidemia and unfortunately continues to smoke.  She has had blood work done by primary care recently.  She mentions to me that she exerts and her heart rate goes up and she feels tight in the chest.  This concerns her and limits her activity.  Unfortunately she smokes heavily.  At the time of my evaluation, the patient is alert awake oriented and in no distress.  Past Medical History:  Diagnosis Date   Anemia    years ago per pt   Angina pectoris (HCC) 01/10/2019   Anxiety    Asthma    Asthma, chronic obstructive, with acute exacerbation (HCC)    Brenda Cochran 01/13/2014  FEV1 76% predicted FVC 77% predicted FEV1 to FVC ratio 100% predicted FEF 25 7569% predicted CXR 01/13/14: NAD Alpha one  antitrypsin 156  IgE 56 sl elevated.  RAST neg     Atherosclerotic heart disease of native coronary artery without angina pectoris    CAD (coronary artery disease) 09/23/2018   Chronic fatigue syndrome 12/22/2020   Chronic maxillary sinusitis    Chronic tension-type headache, intractable 08/07/2019   Cigarette nicotine dependence with nicotine-induced disorder 08/18/2019    Continuous dependence on cigarette smoking 06/09/2019   COPD (chronic obstructive pulmonary disease) (HCC)    COPD mixed type (HCC) 11/09/2020   COPD with acute exacerbation (HCC) 11/10/2020   Coronary artery disease involving native coronary artery of native heart without angina pectoris 07/25/2018   Cough 10/29/2019   Depression    Dyslipidemia associated with type 2 diabetes mellitus (HCC) 08/23/2018   Dysrhythmia    Essential hypertension 07/18/2018   Family history of colonic polyps    Fibromyalgia    GERD (gastroesophageal reflux disease)    Headache    History of colon polyps    Hyperlipidemia    Hypertensive heart disease without heart failure    Hypothyroidism (acquired)    Loculated pleural effusion 12/30/2019   Major depressive disorder, single episode, mild (HCC)    Menopausal and female climacteric states    Mixed hyperlipidemia    Nicotine dependence, cigarettes, with unspecified nicotine-induced disorders    NSTEMI (non-ST elevated myocardial infarction) (HCC) 07/12/2018   Numbness and tingling in left upper extremity    Obesity 12/22/2020   OSA (obstructive sleep apnea) 01/13/2014   Sleep study 02/19/14 : AHI 5.5.  Mild sleep apnea.  Lowest SpO2 81%    Osteoarthritis of right knee 03/01/2022   Overweight    Pain in right knee 07/04/2017   Palpitations 08/15/2019   Pleural effusion, not elsewhere classified 12/30/2019   Pneumonia due to COVID-19 virus 01/06/2020   Preoperative cardiovascular examination 08/30/2021   Primary osteoarthritis    RA (rheumatoid arthritis) (HCC)    Brenda Cochran   Shortness of breath 10/29/2019   Skin cancer    face   Sleep apnea    cpap   TIA (transient ischemic attack) 09/14/2016   Type 2 diabetes mellitus (HCC)    Type 2 diabetes mellitus with diabetic nephropathy (HCC)    Vitamin D deficiency, unspecified     Past Surgical History:  Procedure Laterality Date   ABDOMINAL HYSTERECTOMY  06/05/1982   BREAST LUMPECTOMY   06/05/1974   left, benign   CARPAL TUNNEL RELEASE Right 1998   CHOLECYSTECTOMY  09/05/2010   COLONOSCOPY  02/07/2013   Colon polyp-status post polypectomy. Small internal hemorrhoids. Otherwise grossly normal colonoscopy   CORONARY PRESSURE/FFR STUDY N/A 01/21/2019   Procedure: INTRAVASCULAR PRESSURE WIRE/FFR STUDY;  Surgeon: Corky Crafts, MD;  Location: [facility] INVASIVE CV LAB;  Service: Cardiovascular;  Laterality: N/A;   CORONARY PRESSURE/FFR STUDY N/A 09/16/2021   Procedure: INTRAVASCULAR PRESSURE WIRE/FFR STUDY;  Surgeon: Lyn Records, MD;  Location: [facility] INVASIVE CV LAB;  Service: Cardiovascular;  Laterality: N/A;   CORONARY STENT INTERVENTION N/A 07/12/2018   Procedure: CORONARY STENT INTERVENTION;  Surgeon: Corky Crafts, MD;  Location: [facility] INVASIVE CV LAB;  Service: Cardiovascular;  Laterality: N/A;   CORONARY THROMBECTOMY N/A 07/12/2018   Procedure: Coronary Thrombectomy;  Surgeon: Corky Crafts, MD;  Location: [facility] INVASIVE CV LAB;  Service: Cardiovascular;  Laterality: N/A;   CYST EXCISION Left 1980   foot   ESOPHAGOGASTRODUODENOSCOPY  03/26/2017   Moderate gastritis. Otherwise normal EGD   HEEL SPUR SURGERY     foot for spurs  KNEE ARTHROPLASTY Right 03/01/2022   Procedure: COMPUTER ASSISTED TOTAL KNEE ARTHROPLASTY;  Surgeon: Samson Frederic, MD;  Location: [facility] ORS;  Service: Orthopedics;  Laterality: Right;  150   LEFT HEART CATH AND CORONARY ANGIOGRAPHY N/A 07/12/2018   Procedure: LEFT HEART CATH AND CORONARY ANGIOGRAPHY;  Surgeon: Corky Crafts, MD;  Location: [facility] INVASIVE CV LAB;  Service: Cardiovascular;  Laterality: N/A;   LEFT HEART CATH AND CORONARY ANGIOGRAPHY N/A 01/21/2019   Procedure: LEFT HEART CATH AND CORONARY ANGIOGRAPHY;  Surgeon: Corky Crafts, MD;  Location: [facility] INVASIVE CV LAB;  Service: Cardiovascular;  Laterality: N/A;   LEFT HEART CATH AND CORONARY ANGIOGRAPHY N/A 09/16/2021   Procedure: LEFT HEART CATH AND CORONARY ANGIOGRAPHY;   Surgeon: Lyn Records, MD;  Location: [facility] INVASIVE CV LAB;  Service: Cardiovascular;  Laterality: N/A;   TONSILLECTOMY     adenoids    Current Medications: Current Meds  Medication Sig   albuterol (PROVENTIL) (2.5 MG/3ML) 0.083% nebulizer solution Take 3 mLs (2.5 mg total) by nebulization every 6 (six) hours as needed for wheezing or shortness of breath.   albuterol (VENTOLIN HFA) 108 (90 Base) MCG/ACT inhaler Inhale 1-2 puffs into the lungs every 6 (six) hours as needed for wheezing or shortness of breath.   Budeson-Glycopyrrol-Formoterol (BREZTRI AEROSPHERE) 160-9-4.8 MCG/ACT AERO Inhale 2 puffs into the lungs 2 (two) times daily.   ezetimibe (ZETIA) 10 MG tablet Take 10 mg by mouth at bedtime.   fluticasone (FLONASE) 50 MCG/ACT nasal spray Place 1 spray into both nostrils daily as needed for allergies or rhinitis.   JARDIANCE 25 MG TABS tablet Take 25 mg by mouth daily.   metoprolol tartrate (LOPRESSOR) 25 MG tablet Take 25 mg by mouth daily.   nitroGLYCERIN (NITROSTAT) 0.4 MG SL tablet Place 1 tablet (0.4 mg total) under the tongue every 5 (five) minutes as needed for chest pain.   nystatin (MYCOSTATIN/NYSTOP) powder Apply 1 application  topically 2 (two) times daily as needed (irritation).   omeprazole (PRILOSEC) 40 MG capsule Take 40 mg by mouth daily.   rosuvastatin (CRESTOR) 40 MG tablet Take 40 mg by mouth daily.   TRULICITY 1.5 MG/0.5ML SOPN Inject 1.5 mg into the skin once a week.     Allergies:   Shellfish allergy, Contrast media [iodinated contrast media], Metformin and related, Duloxetine, Oxycodone, Amitriptyline hcl, Cymbalta [duloxetine hcl], Iohexol, Oxycontin [oxycodone hcl], and Ultram [tramadol]   Social History   Socioeconomic History   Marital status: Married    Spouse name: Not on file   Number of children: 6   Years of education: Not on file   Highest education level: Not on file  Occupational History   Occupation: disabled    Comment: jockey  Tobacco Use    Smoking status: Former    Current packs/day: 4.00    Average packs/day: 4.0 packs/day for 30.0 years (120.0 ttl pk-yrs)    Types: Cigarettes   Smokeless tobacco: Never   Tobacco comments:    Quit 2 weeks ago, 11/15/2020  Vaping Use   Vaping status: Never Used  Substance and Sexual Activity   Alcohol use: No   Drug use: No   Sexual activity: Not on file  Other Topics Concern   Not on file  Social History Narrative   Lives with Orlene Salmons husband.   Disabled   Social Drivers of Corporate investment banker Strain: Not on file  Food Insecurity: No Food Insecurity (03/01/2022)   Hunger Vital Sign    Worried  About Running Out of Food in the Last Year: Never true    Ran Out of Food in the Last Year: Never true  Transportation Needs: No Transportation Needs (03/01/2022)   PRAPARE - Administrator, Civil Service (Medical): No    Lack of Transportation (Non-Medical): No  Physical Activity: Not on file  Stress: Not on file  Social Connections: Not on file     Family History: The patient's family history includes Bone cancer in her father; Breast cancer in her sister; CAD in an other family member; COPD in her brother; Diabetes in an other family member; Hypertension in an other family member; Lung cancer in her father; Lung disease in her maternal uncle, maternal uncle, and mother; Lymphoma in her sister; Multiple sclerosis in her brother; Muscular dystrophy in her brother; Seizures in an other family member. There is no history of Colon cancer, Colon polyps, Esophageal cancer, Stomach cancer, or Rectal cancer.  ROS:   Please see the history of present illness.    All other systems reviewed and are negative.  EKGs/Labs/Other Studies Reviewed:    The following studies were reviewed today: .Marland KitchenEKG Interpretation Date/Time:  Wednesday August 08 2023 10:26:44 EST Ventricular Rate:  112 PR Interval:  140 QRS Duration:  76 QT Interval:  328 QTC Calculation: 447 R  Axis:   -15  Text Interpretation: Sinus tachycardia Otherwise normal ECG When compared with ECG of 16-Sep-2021 15:25, No significant change was found Confirmed by Belva Crome [phone]) on 08/08/2023 10:42:47 AM     Recent Labs: No results found for requested labs within last 365 days.  Recent Lipid Panel    Component Value Date/Time   CHOL 214 (H) 06/18/2020 0925   TRIG 268 (H) 06/18/2020 0925   HDL 42 06/18/2020 0925   CHOLHDL 5.1 (H) 06/18/2020 0925   CHOLHDL 5.7 07/12/2018 0333   VLDL 29 07/12/2018 0333   LDLCALC 125 (H) 06/18/2020 0925    Physical Exam:    VS:  BP 120/84 (BP Location: Right Arm, Patient Position: Sitting)   Pulse (!) 112   Ht 5\' 5"  (1.651 m)   Wt 150 lb 3.2 oz (68.1 kg)   SpO2 93%   BMI 24.99 kg/m     Wt Readings from Last 3 Encounters:  08/08/23 150 lb 3.2 oz (68.1 kg)  08/08/22 164 lb 12.8 oz (74.8 kg)  03/01/22 171 lb 15.3 oz (78 kg)     GEN: Patient is in no acute distress HEENT: Normal NECK: No JVD; No carotid bruits LYMPHATICS: No lymphadenopathy CARDIAC: Hear sounds regular, 2/6 systolic murmur at the apex. RESPIRATORY:  Clear to auscultation without rales, wheezing or rhonchi  ABDOMEN: Soft, non-tender, non-distended MUSCULOSKELETAL:  No edema; No deformity  SKIN: Warm and dry NEUROLOGIC:  Alert and oriented x 3 PSYCHIATRIC:  Normal affect   Signed, Garwin Brothers, MD  08/08/2023 10:58 AM    [facility]

## 2023-08-13 ENCOUNTER — Telehealth: Payer: Self-pay | Admitting: *Deleted

## 2023-08-13 NOTE — Telephone Encounter (Addendum)
 Cardiac Catheterization scheduled at [facility] for: Tuesday August 14, 2023 9 AM Arrival time [facility] Main Entrance A at: 7 AM  Nothing to eat after midnight prior to procedure, clear liquids until 5 AM day of procedure.  CONTRAST ALLERGY: 13 hour Prednisone and Benadryl Prep: 08/13/23 Prednisone 50 mg 8 PM 08/14/23 Prednisone 50 mg 2 AM 08/14/23 Prednisone 50 mg and benadryl 50 mg just prior to leaving home for hospital Patient advised not to drive after taking Benadryl.  Medication instructions: -Usual morning medications can be taken with sips of water including aspirin 81 mg.  Plan to go home the same day, you will only stay overnight if medically necessary.  You must have responsible adult to drive you home.  Someone must be with you the first 24 hours after you arrive home.  Reviewed procedure instructions with patient.

## 2023-08-14 ENCOUNTER — Encounter (HOSPITAL_COMMUNITY)
Admission: RE | Disposition: A | Discharge status: Home / Self Care | Payer: Self-pay | Source: Home / Self Care | Attending: Cardiology

## 2023-08-14 ENCOUNTER — Ambulatory Visit (HOSPITAL_COMMUNITY)
Admission: RE | Admit: 2023-08-14 | Discharge: 2023-08-14 | Disposition: A | Discharge status: Home / Self Care | Attending: Cardiology | Admitting: Cardiology

## 2023-08-14 ENCOUNTER — Other Ambulatory Visit: Payer: Self-pay

## 2023-08-14 DIAGNOSIS — Z955 Presence of coronary angioplasty implant and graft: Secondary | ICD-10-CM | POA: Diagnosis not present

## 2023-08-14 DIAGNOSIS — J431 Panlobular emphysema: Secondary | ICD-10-CM

## 2023-08-14 DIAGNOSIS — I25119 Atherosclerotic heart disease of native coronary artery with unspecified angina pectoris: Secondary | ICD-10-CM | POA: Insufficient documentation

## 2023-08-14 DIAGNOSIS — F17210 Nicotine dependence, cigarettes, uncomplicated: Secondary | ICD-10-CM | POA: Diagnosis not present

## 2023-08-14 DIAGNOSIS — Z79899 Other long term (current) drug therapy: Secondary | ICD-10-CM | POA: Insufficient documentation

## 2023-08-14 DIAGNOSIS — I10 Essential (primary) hypertension: Secondary | ICD-10-CM

## 2023-08-14 DIAGNOSIS — E782 Mixed hyperlipidemia: Secondary | ICD-10-CM | POA: Diagnosis not present

## 2023-08-14 DIAGNOSIS — I2510 Atherosclerotic heart disease of native coronary artery without angina pectoris: Secondary | ICD-10-CM

## 2023-08-14 DIAGNOSIS — I209 Angina pectoris, unspecified: Secondary | ICD-10-CM

## 2023-08-14 DIAGNOSIS — G4733 Obstructive sleep apnea (adult) (pediatric): Secondary | ICD-10-CM

## 2023-08-14 HISTORY — PX: LEFT HEART CATH AND CORONARY ANGIOGRAPHY: CATH118249

## 2023-08-14 LAB — GLUCOSE, CAPILLARY
Glucose-Capillary: 173 mg/dL — ABNORMAL HIGH (ref 70–99)
Glucose-Capillary: 154 mg/dL — ABNORMAL HIGH (ref 70–99)

## 2023-08-14 SURGERY — LEFT HEART CATH AND CORONARY ANGIOGRAPHY
Anesthesia: LOCAL

## 2023-08-14 MED ORDER — MIDAZOLAM HCL 2 MG/2ML IJ SOLN
INTRAMUSCULAR | Status: DC | PRN
  Administered 2023-08-14: 1 mg via INTRAVENOUS

## 2023-08-14 MED ORDER — FENTANYL CITRATE (PF) 100 MCG/2ML IJ SOLN
INTRAMUSCULAR | Status: AC
  Filled 2023-08-14: qty 2

## 2023-08-14 MED ORDER — HYDRALAZINE HCL 20 MG/ML IJ SOLN: 10.0000 mg | INTRAMUSCULAR | Status: DC | PRN

## 2023-08-14 MED ORDER — HEPARIN (PORCINE) IN NACL 1000-0.9 UT/500ML-% IV SOLN
INTRAVENOUS | Status: DC | PRN
  Administered 2023-08-14: 1000 mL

## 2023-08-14 MED ORDER — SODIUM CHLORIDE 0.9 % WEIGHT BASED INFUSION
1.0000 mL/kg/h | INTRAVENOUS | Status: DC
Start: 2023-08-14 — End: 2023-08-14

## 2023-08-14 MED ORDER — SODIUM CHLORIDE 0.9 % IV SOLN: 250.0000 mL | INTRAVENOUS | Status: DC | PRN

## 2023-08-14 MED ORDER — SODIUM CHLORIDE 0.9% FLUSH: 3.0000 mL | Freq: Two times a day (BID) | INTRAVENOUS | Status: DC

## 2023-08-14 MED ORDER — HEPARIN SODIUM (PORCINE) 1000 UNIT/ML IJ SOLN
INTRAMUSCULAR | Status: DC | PRN
  Administered 2023-08-14: 3500 [IU] via INTRAVENOUS

## 2023-08-14 MED ORDER — VERAPAMIL HCL 2.5 MG/ML IV SOLN
INTRAVENOUS | Status: AC
  Filled 2023-08-14: qty 2

## 2023-08-14 MED ORDER — MIDAZOLAM HCL 2 MG/2ML IJ SOLN
INTRAMUSCULAR | Status: AC
  Filled 2023-08-14: qty 2

## 2023-08-14 MED ORDER — SODIUM CHLORIDE 0.9 % WEIGHT BASED INFUSION: 3.0000 mL/kg/h | INTRAVENOUS | Status: AC

## 2023-08-14 MED ORDER — HEPARIN SODIUM (PORCINE) 1000 UNIT/ML IJ SOLN
INTRAMUSCULAR | Status: AC
  Filled 2023-08-14: qty 10

## 2023-08-14 MED ORDER — IOHEXOL 350 MG/ML SOLN
INTRAVENOUS | Status: DC | PRN
  Administered 2023-08-14: 35 mL

## 2023-08-14 MED ORDER — VERAPAMIL HCL 2.5 MG/ML IV SOLN
INTRAVENOUS | Status: DC | PRN
  Administered 2023-08-14: 10 mL via INTRA_ARTERIAL

## 2023-08-14 MED ORDER — LIDOCAINE HCL (PF) 1 % IJ SOLN
INTRAMUSCULAR | Status: DC | PRN
  Administered 2023-08-14: 2 mL via INTRADERMAL

## 2023-08-14 MED ORDER — SODIUM CHLORIDE 0.9% FLUSH: 3.0000 mL | INTRAVENOUS | Status: DC | PRN

## 2023-08-14 MED ORDER — ACETAMINOPHEN 325 MG PO TABS: 650.0000 mg | ORAL_TABLET | ORAL | Status: DC | PRN

## 2023-08-14 MED ORDER — FENTANYL CITRATE (PF) 100 MCG/2ML IJ SOLN
INTRAMUSCULAR | Status: DC | PRN
  Administered 2023-08-14: 25 ug via INTRAVENOUS

## 2023-08-14 MED ORDER — ONDANSETRON HCL 4 MG/2ML IJ SOLN: 4.0000 mg | Freq: Four times a day (QID) | INTRAMUSCULAR | Status: DC | PRN

## 2023-08-14 MED ORDER — LIDOCAINE HCL (PF) 1 % IJ SOLN
INTRAMUSCULAR | Status: AC
  Filled 2023-08-14: qty 30

## 2023-08-14 MED ORDER — ASPIRIN 81 MG PO CHEW: 81.0000 mg | CHEWABLE_TABLET | ORAL | Status: DC

## 2023-08-14 SURGICAL SUPPLY — 10 items
CARD KEY FFR CATHWORX (MISCELLANEOUS) IMPLANT
CATH 5FR JL3.5 JR4 ANG PIG MP (CATHETERS) IMPLANT
DEVICE RAD COMP TR BAND LRG (VASCULAR PRODUCTS) IMPLANT
FFR CATHWORX KEY CARD (MISCELLANEOUS) IMPLANT
GLIDESHEATH SLEND SS 6F .021 (SHEATH) IMPLANT
GUIDEWIRE INQWIRE 1.5J.035X260 (WIRE) IMPLANT
INQWIRE 1.5J .035X260CM (WIRE) ×1 IMPLANT
PACK CARDIAC CATHETERIZATION (CUSTOM PROCEDURE TRAY) ×1 IMPLANT
SET ATX-X65L (MISCELLANEOUS) IMPLANT
SHEATH GLIDE SLENDER 4/5FR (SHEATH) IMPLANT

## 2023-08-14 NOTE — Discharge Instructions (Signed)

## 2023-08-14 NOTE — Progress Notes (Signed)
 Patient and daughter was given discharge instructions. Both verbalized understanding.

## 2023-08-14 NOTE — Interval H&P Note (Signed)
 History and Physical Interval Note:  08/14/2023 9:00 AM  Brenda Cochran  has presented today for surgery, with the diagnosis of angina.  The various methods of treatment have been discussed with the patient and family. After consideration of risks, benefits and other options for treatment, the patient has consented to  Procedure(s): LEFT HEART CATH AND CORONARY ANGIOGRAPHY (N/A) as a surgical intervention.  The patient's history has been reviewed, patient examined, no change in status, stable for surgery.  I have reviewed the patient's chart and labs.  Questions were answered to the patient's satisfaction.   Cath Lab Visit (complete for each Cath Lab visit)  Clinical Evaluation Leading to the Procedure:   ACS: No.  Non-ACS:    Anginal Classification: CCS III  Anti-ischemic medical therapy: Minimal Therapy (1 class of medications)  Non-Invasive Test Results: No non-invasive testing performed  Prior CABG: No previous CABG        Theron Arista [facility] 08/14/2023 9:00 AM

## 2023-08-15 ENCOUNTER — Encounter (HOSPITAL_COMMUNITY): Payer: Self-pay | Admitting: Cardiology

## 2023-08-29 ENCOUNTER — Other Ambulatory Visit: Payer: Self-pay

## 2023-09-05 ENCOUNTER — Ambulatory Visit: Admitting: Cardiology

## 2024-04-23 ENCOUNTER — Emergency Department (HOSPITAL_COMMUNITY)

## 2024-04-23 ENCOUNTER — Encounter (HOSPITAL_COMMUNITY): Payer: Self-pay | Admitting: Emergency Medicine

## 2024-04-23 ENCOUNTER — Emergency Department (HOSPITAL_COMMUNITY)
Admission: EM | Admit: 2024-04-23 | Discharge: 2024-04-23 | Discharge status: Against Medical Advice | Attending: Emergency Medicine | Admitting: Emergency Medicine

## 2024-04-23 ENCOUNTER — Other Ambulatory Visit: Payer: Self-pay

## 2024-04-23 ENCOUNTER — Telehealth: Payer: Self-pay | Admitting: Cardiology

## 2024-04-23 DIAGNOSIS — E119 Type 2 diabetes mellitus without complications: Secondary | ICD-10-CM | POA: Insufficient documentation

## 2024-04-23 DIAGNOSIS — R0602 Shortness of breath: Secondary | ICD-10-CM | POA: Insufficient documentation

## 2024-04-23 DIAGNOSIS — R252 Cramp and spasm: Secondary | ICD-10-CM | POA: Insufficient documentation

## 2024-04-23 DIAGNOSIS — R2242 Localized swelling, mass and lump, left lower limb: Secondary | ICD-10-CM | POA: Diagnosis not present

## 2024-04-23 DIAGNOSIS — I509 Heart failure, unspecified: Secondary | ICD-10-CM | POA: Diagnosis not present

## 2024-04-23 DIAGNOSIS — Z5321 Procedure and treatment not carried out due to patient leaving prior to being seen by health care provider: Secondary | ICD-10-CM | POA: Diagnosis not present

## 2024-04-23 DIAGNOSIS — R079 Chest pain, unspecified: Secondary | ICD-10-CM | POA: Diagnosis present

## 2024-04-23 LAB — CBC WITH DIFFERENTIAL/PLATELET
Abs Immature Granulocytes: 0.02 K/uL (ref 0.00–0.07)
Basophils Absolute: 0.1 K/uL (ref 0.0–0.1)
Basophils Relative: 1 %
Eosinophils Absolute: 0.2 K/uL (ref 0.0–0.5)
Eosinophils Relative: 4 %
HCT: 44.6 % (ref 36.0–46.0)
Hemoglobin: 14.7 g/dL (ref 12.0–15.0)
Immature Granulocytes: 0 %
Lymphocytes Relative: 36 %
Lymphs Abs: 2.2 K/uL (ref 0.7–4.0)
MCH: 30.7 pg (ref 26.0–34.0)
MCHC: 33 g/dL (ref 30.0–36.0)
MCV: 93.1 fL (ref 80.0–100.0)
Monocytes Absolute: 0.5 K/uL (ref 0.1–1.0)
Monocytes Relative: 7 %
Neutro Abs: 3.2 K/uL (ref 1.7–7.7)
Neutrophils Relative %: 52 %
Platelets: 310 K/uL (ref 150–400)
RBC: 4.79 MIL/uL (ref 3.87–5.11)
RDW: 12 % (ref 11.5–15.5)
WBC: 6.2 K/uL (ref 4.0–10.5)
nRBC: 0 % (ref 0.0–0.2)

## 2024-04-23 LAB — BASIC METABOLIC PANEL WITH GFR
Anion gap: 15 (ref 5–15)
BUN: 15 mg/dL (ref 8–23)
CO2: 21 mmol/L — ABNORMAL LOW (ref 22–32)
Calcium: 9.2 mg/dL (ref 8.9–10.3)
Chloride: 103 mmol/L (ref 98–111)
Creatinine, Ser: 0.8 mg/dL (ref 0.44–1.00)
GFR, Estimated: >60 mL/min (ref >60)
Glucose, Bld: 114 mg/dL — ABNORMAL HIGH (ref 70–99)
Potassium: 3.6 mmol/L (ref 3.5–5.1)
Sodium: 139 mmol/L (ref 135–145)

## 2024-04-23 LAB — D-DIMER, QUANTITATIVE: D-Dimer, Quant: >20 ug{FEU}/mL — ABNORMAL HIGH (ref 0.00–0.50)

## 2024-04-23 LAB — TROPONIN I (HIGH SENSITIVITY): Troponin I (High Sensitivity): 5 ng/L (ref <18)

## 2024-04-23 LAB — BRAIN NATRIURETIC PEPTIDE: B Natriuretic Peptide: 15.2 pg/mL (ref 0.0–100.0)

## 2024-04-23 NOTE — ED Triage Notes (Signed)
 Patient arrives in wheelchair by POV c/o intermittent swelling to left leg and foot with turning purple and shortness of breath over the last month and a half. Reports muscle cramping in chest over the past week. Cardiology sent here for DVT rule out.

## 2024-04-23 NOTE — Telephone Encounter (Signed)
  Pt c/o swelling/edema: STAT if pt has developed SOB within 24 hours  If swelling, where is the swelling located? Both legs, left leg and foot is more swollen   How much weight have you gained and in what time span? Not sure   Have you gained 2 pounds in a day or 5 pounds in a week? Not sure   Do you have a log of your daily weights (if so, list)? None   Are you currently taking a fluid pill? No   Are you currently SOB? Yes  Have you traveled recently in a car or plane for an extended period of time? No   Patient said to call daughter Agnes at [phone] she's with her

## 2024-04-23 NOTE — ED Provider Triage Note (Signed)
 Emergency Medicine Provider Triage Evaluation Note  Brenda Cochran , a 73 y.o. female  was evaluated in triage.  Pt complains of chest pain, shortness of breath, intermittent swelling to left leg with color change.  Sent by cardiologist.  History of CHF, diabetes, NSTEMI  Review of Systems  Positive: chest pain, shortness of breath, intermittent swelling to left leg with color change Negative: Fever, chills, nausea, vomiting  Physical Exam  BP (!) 143/82 (BP Location: Left Arm)   Pulse 95   Temp (!) 97.4 F (36.3 C)   Resp 17   Ht 5' 5 (1.651 m)   Wt 68 kg   SpO2 99%   BMI 24.96 kg/m  Gen:   Awake, no distress   Resp:  Normal effort  MSK:   Moves extremities without difficulty  Other:  Chronically ill-appearing  Medical Decision Making  Medically screening exam initiated at 6:57 PM.  Appropriate orders placed.  Brenda Cochran was informed that the remainder of the evaluation will be completed by another provider, this initial triage assessment does not replace that evaluation, and the importance of remaining in the ED until their evaluation is complete.  Labs and imaging ordered   Francis Ileana LOISE DEVONNA 04/23/24 1902

## 2024-04-23 NOTE — Telephone Encounter (Signed)
 Patient has an appointment with Dr. Revankar at 4 pm on 11/20. Patient was instructed to keep this appointment and Dr. Edwyna would evaluate her at that time. Instructed if shortness of breath worsened or had any other concerns to be evaluated in the emergency department.  Alan, RN

## 2024-04-23 NOTE — ED Notes (Signed)
 Patient decided to leave even though her foot is swelling more. I encouraged her to stay but she said no. Taking her OTF.

## 2024-04-23 NOTE — Telephone Encounter (Signed)
 Patient's daughter was called back per patient's request at 12 pm regarding Dr. Posey recommendation to be evaluated in the ED and discussed why. Patient called back and call was returned at 12:49 pm. Discussed concerns from Dr. Revankar with patient. Patient reports she is going to go to [facility] ER this afternoon to be evaluated.  Alan, RN

## 2024-04-24 ENCOUNTER — Emergency Department (HOSPITAL_BASED_OUTPATIENT_CLINIC_OR_DEPARTMENT_OTHER)

## 2024-04-24 ENCOUNTER — Encounter (HOSPITAL_BASED_OUTPATIENT_CLINIC_OR_DEPARTMENT_OTHER): Payer: Self-pay | Admitting: Urology

## 2024-04-24 ENCOUNTER — Encounter: Payer: Self-pay | Admitting: Cardiology

## 2024-04-24 ENCOUNTER — Ambulatory Visit: Attending: Cardiology | Admitting: Cardiology

## 2024-04-24 ENCOUNTER — Emergency Department (HOSPITAL_BASED_OUTPATIENT_CLINIC_OR_DEPARTMENT_OTHER)
Admission: EM | Admit: 2024-04-24 | Discharge: 2024-04-24 | Disposition: A | Discharge status: Home / Self Care | Attending: Emergency Medicine | Admitting: Emergency Medicine

## 2024-04-24 VITALS — BP 132/66 | HR 96 | Wt 172.0 lb

## 2024-04-24 DIAGNOSIS — I10 Essential (primary) hypertension: Secondary | ICD-10-CM | POA: Diagnosis not present

## 2024-04-24 DIAGNOSIS — E782 Mixed hyperlipidemia: Secondary | ICD-10-CM

## 2024-04-24 DIAGNOSIS — I824Y2 Acute embolism and thrombosis of unspecified deep veins of left proximal lower extremity: Secondary | ICD-10-CM | POA: Diagnosis not present

## 2024-04-24 DIAGNOSIS — J431 Panlobular emphysema: Secondary | ICD-10-CM | POA: Diagnosis not present

## 2024-04-24 DIAGNOSIS — Z7982 Long term (current) use of aspirin: Secondary | ICD-10-CM | POA: Diagnosis not present

## 2024-04-24 DIAGNOSIS — I2510 Atherosclerotic heart disease of native coronary artery without angina pectoris: Secondary | ICD-10-CM

## 2024-04-24 DIAGNOSIS — R0602 Shortness of breath: Secondary | ICD-10-CM | POA: Diagnosis present

## 2024-04-24 DIAGNOSIS — G4730 Sleep apnea, unspecified: Secondary | ICD-10-CM

## 2024-04-24 LAB — CBC WITH DIFFERENTIAL/PLATELET
Abs Immature Granulocytes: 0.02 K/uL (ref 0.00–0.07)
Basophils Absolute: 0 K/uL (ref 0.0–0.1)
Basophils Relative: 1 %
Eosinophils Absolute: 0.2 K/uL (ref 0.0–0.5)
Eosinophils Relative: 3 %
HCT: 40.8 % (ref 36.0–46.0)
Hemoglobin: 13.7 g/dL (ref 12.0–15.0)
Immature Granulocytes: 0 %
Lymphocytes Relative: 36 %
Lymphs Abs: 2.3 K/uL (ref 0.7–4.0)
MCH: 30.6 pg (ref 26.0–34.0)
MCHC: 33.6 g/dL (ref 30.0–36.0)
MCV: 91.3 fL (ref 80.0–100.0)
Monocytes Absolute: 0.6 K/uL (ref 0.1–1.0)
Monocytes Relative: 10 %
Neutro Abs: 3.3 K/uL (ref 1.7–7.7)
Neutrophils Relative %: 50 %
Platelets: 298 K/uL (ref 150–400)
RBC: 4.47 MIL/uL (ref 3.87–5.11)
RDW: 12.1 % (ref 11.5–15.5)
WBC: 6.4 K/uL (ref 4.0–10.5)
nRBC: 0 % (ref 0.0–0.2)

## 2024-04-24 LAB — TROPONIN T, HIGH SENSITIVITY: Troponin T High Sensitivity: <15 ng/L (ref 0–19)

## 2024-04-24 LAB — COMPREHENSIVE METABOLIC PANEL WITH GFR
ALT: 7 U/L (ref 0–44)
AST: 18 U/L (ref 15–41)
Albumin: 3.9 g/dL (ref 3.5–5.0)
Alkaline Phosphatase: 100 U/L (ref 38–126)
Anion gap: 13 (ref 5–15)
BUN: 13 mg/dL (ref 8–23)
CO2: 25 mmol/L (ref 22–32)
Calcium: 9.6 mg/dL (ref 8.9–10.3)
Chloride: 102 mmol/L (ref 98–111)
Creatinine, Ser: 0.85 mg/dL (ref 0.44–1.00)
GFR, Estimated: >60 mL/min (ref >60)
Glucose, Bld: 132 mg/dL — ABNORMAL HIGH (ref 70–99)
Potassium: 3.8 mmol/L (ref 3.5–5.1)
Sodium: 140 mmol/L (ref 135–145)
Total Bilirubin: 0.3 mg/dL (ref 0.0–1.2)
Total Protein: 7.3 g/dL (ref 6.5–8.1)

## 2024-04-24 LAB — CK: Total CK: 41 U/L (ref 38–234)

## 2024-04-24 MED ORDER — KETOROLAC TROMETHAMINE 30 MG/ML IJ SOLN
30.0000 mg | Freq: Once | INTRAMUSCULAR | Status: AC
  Administered 2024-04-24: 30 mg via INTRAVENOUS
  Filled 2024-04-24: qty 1

## 2024-04-24 MED ORDER — DIPHENHYDRAMINE HCL 25 MG PO CAPS: 50.0000 mg | ORAL_CAPSULE | Freq: Once | ORAL | Status: AC

## 2024-04-24 MED ORDER — METHYLPREDNISOLONE SODIUM SUCC 125 MG IJ SOLR
125.0000 mg | Freq: Once | INTRAMUSCULAR | Status: AC
  Administered 2024-04-24: 125 mg via INTRAVENOUS
  Filled 2024-04-24: qty 2

## 2024-04-24 MED ORDER — HEPARIN (PORCINE) 25000 UT/250ML-% IV SOLN
1250.0000 [IU]/h | INTRAVENOUS | Status: DC
  Filled 2024-04-24: qty 250

## 2024-04-24 MED ORDER — DIPHENHYDRAMINE HCL 50 MG/ML IJ SOLN
50.0000 mg | Freq: Once | INTRAMUSCULAR | Status: AC
  Administered 2024-04-24: 50 mg via INTRAVENOUS
  Filled 2024-04-24: qty 1

## 2024-04-24 MED ORDER — HEPARIN BOLUS VIA INFUSION: 4500.0000 [IU] | Freq: Once | INTRAVENOUS | Status: DC

## 2024-04-24 MED ORDER — APIXABAN 2.5 MG PO TABS
10.0000 mg | ORAL_TABLET | Freq: Once | ORAL | Status: AC
  Administered 2024-04-24: 10 mg via ORAL
  Filled 2024-04-24: qty 4

## 2024-04-24 MED ORDER — IOHEXOL 350 MG/ML SOLN
100.0000 mL | Freq: Once | INTRAVENOUS | Status: AC | PRN
  Administered 2024-04-24: 100 mL via INTRAVENOUS

## 2024-04-24 MED ORDER — APIXABAN (ELIQUIS) VTE STARTER PACK (10MG AND 5MG): ORAL_TABLET | ORAL | 0 refills | Status: DC

## 2024-04-24 NOTE — Discharge Instructions (Signed)
 As we discussed, you have a extensive clot in your left leg  I have prescribed Eliquis and please take it as prescribed  Please elevate your leg  I also recommend you follow-up with vascular clinic and they have a DVT clinic   Return to ER if you have worse leg swelling or chest pain or shortness of breath

## 2024-04-24 NOTE — ED Notes (Signed)
 Patient verbalizes understanding of discharge instructions. Opportunity for questioning and answers were provided. Armband removed by staff, pt discharged from ED. Wheeled out to lobby with family

## 2024-04-24 NOTE — Progress Notes (Signed)
 PHARMACY - ANTICOAGULATION CONSULT NOTE  Pharmacy Consult for heparin  Indication: DVT  Allergies  Allergen Reactions   Shellfish Allergy  Anaphylaxis   Contrast Media [Iodinated Contrast Media] Rash   Metformin And Related Nausea And Vomiting   Duloxetine     Other reaction(s): Not available   Oxycodone     Other reaction(s): Not available   Amitriptyline Hcl Other (See Comments)    Somnolence   Cymbalta [Duloxetine Hcl] Nausea And Vomiting   Iohexol  Rash   Oxycontin [Oxycodone Hcl] Palpitations   Ultram [Tramadol] Nausea And Vomiting    Patient Measurements: Height: 5' 5 (165.1 cm) Weight: 78 kg (171 lb 15.3 oz) IBW/kg (Calculated) : 57 HEPARIN  DW (KG): 73.3  Vital Signs: Temp: 97.1 F (36.2 C) (11/20 1742) BP: 150/74 (11/20 1934) Pulse Rate: 79 (11/20 1934)  Labs: Recent Labs    04/23/24 1918 04/24/24 1751  HGB 14.7 13.7  HCT 44.6 40.8  PLT 310 298  CREATININE 0.80 0.85  CKTOTAL  --  41  TROPONINIHS 5  --     Estimated Creatinine Clearance: 61.8 mL/min (by C-G formula based on SCr of 0.85 mg/dL).   Medical History: Past Medical History:  Diagnosis Date   Anemia    years ago per pt   Angina pectoris 01/10/2019   Anxiety    Asthma    Asthma, chronic obstructive, with acute exacerbation (HCC)    Spiro 01/13/2014  FEV1 76% predicted FVC 77% predicted FEV1 to FVC ratio 100% predicted FEF 25 7569% predicted CXR 01/13/14: NAD Alpha one antitrypsin 156  IgE 56 sl elevated.  RAST neg     Atherosclerotic heart disease of native coronary artery without angina pectoris    CAD (coronary artery disease) 09/23/2018   Chronic fatigue syndrome 12/22/2020   Chronic maxillary sinusitis    Chronic tension-type headache, intractable 08/07/2019   Cigarette nicotine  dependence with nicotine -induced disorder 08/18/2019   Continuous dependence on cigarette smoking 06/09/2019   COPD (chronic obstructive pulmonary disease) (HCC)    COPD mixed type (HCC) 11/09/2020   COPD  with acute exacerbation (HCC) 11/10/2020   Coronary artery disease involving native coronary artery of native heart without angina pectoris 07/25/2018   Cough 10/29/2019   Depression    Dyslipidemia associated with type 2 diabetes mellitus (HCC) 08/23/2018   Dysrhythmia    Essential hypertension 07/18/2018   Family history of colonic polyps    Fibromyalgia    GERD (gastroesophageal reflux disease)    Headache    History of colon polyps    Hyperlipidemia    Hypertensive heart disease without heart failure    Hypothyroidism (acquired)    Loculated pleural effusion 12/30/2019   Major depressive disorder, single episode, mild    Menopausal and female climacteric states    Mixed hyperlipidemia    Nicotine  dependence, cigarettes, with unspecified nicotine -induced disorders    NSTEMI (non-ST elevated myocardial infarction) (HCC) 07/12/2018   Numbness and tingling of left upper extremity    IMO SNOMED Dx Update Oct 2024     Obesity 12/22/2020   OSA (obstructive sleep apnea) 01/13/2014   Sleep study 02/19/14 : AHI 5.5.  Mild sleep apnea.  Lowest SpO2 81%    Osteoarthritis of right knee 03/01/2022   Overweight    Pain in right knee 07/04/2017   Palpitations 08/15/2019   Pleural effusion, not elsewhere classified 12/30/2019   Pneumonia due to COVID-19 virus 01/06/2020   Preoperative cardiovascular examination 08/30/2021   Primary osteoarthritis    RA (rheumatoid arthritis) (HCC)  Ludie Piety   Shortness of breath 10/29/2019   Skin cancer    face   Sleep apnea    cpap   TIA (transient ischemic attack) 09/14/2016   Type 2 diabetes mellitus (HCC)    Type 2 diabetes mellitus with diabetic nephropathy (HCC)    Vitamin D deficiency, unspecified      Assessment: 50 YOF presenting with leg pain/swelling, elevated D-dimer, positive US  DVT, she is not on anticoagulation PTA, CT angio chest pending  Goal of Therapy:  Heparin  level 0.3-0.7 units/ml Monitor platelets by  anticoagulation protocol: Yes   Plan:  Heparin  4500 units IV x 1, and gtt at 1250 units/hr F/u 6 hour heparin  level, CT results and long term [facility] plan  Dorn Poot, PharmD, [facility] Clinical Pharmacist ED Pharmacist Phone # [phone] 04/24/2024 9:21 PM

## 2024-04-24 NOTE — Progress Notes (Signed)
 Cardiology Office Note:    Date:  04/24/2024   ID:  Brenda Cochran, DOB 18-Aug-1950, MRN [id number]  PCP:  [name], NP  Cardiologist:  Jennifer JONELLE Crape, MD   Referring MD: [name], NP    ASSESSMENT:    1. Coronary artery disease involving native coronary artery of native heart without angina pectoris   2. Essential hypertension   3. Sleep apnea, unspecified type   4. Panlobular emphysema (HCC)   5. Mixed hyperlipidemia    PLAN:    In order of problems listed above:  Dyspnea on exertion and leg swelling: Elevated D-dimer: I told her to admit to the ER.  Our recommendation was to send her by ambulance.  She is vehemently against it.  I respect her wishes.  She understands the risk including fatal outcomes.  She will go to the nearest emergency room and get evaluated. Essential hypertension: Blood pressure stable and diet was emphasized. Coronary artery disease: Nonobstructive by coronary angiography done a few months ago. Mixed dyslipidemia: She needs to be on statin therapy.  Once she is evaluated for this and discharged then she will give us  a call to address this issues. Patient and daughter had multiple questions which were answered to their satisfaction.  Medication Adjustments/Labs and Tests Ordered: Current medicines are reviewed at length with the patient today.  Concerns regarding medicines are outlined above.  Orders Placed This Encounter  Procedures   EKG 12-Lead   No orders of the defined types were placed in this encounter.    Chief Complaint  Patient presents with   Shortness of Breath     History of Present Illness:    Brenda Cochran is a 73 y.o. female.  Patient has past medical history of nonobstructive coronary artery disease,, essential hypertension and mixed dyslipidemia.  She mentions to me that she stopped all her medications for the past several months.  She went to the ER yesterday.  She had called her office for shortness of breath  and leg swelling and she left the ER on her own.  She denies any chest pain orthopnea or PND.  She tells me that her leg swelling is much better now and she is not short of breath.  I reviewed emergency room records and her D-dimer was markedly elevated.  At the time of my evaluation, the patient is alert awake oriented and in no distress.  Past Medical History:  Diagnosis Date   Anemia    years ago per pt   Angina pectoris 01/10/2019   Anxiety    Asthma    Asthma, chronic obstructive, with acute exacerbation (HCC)    Spiro 01/13/2014  FEV1 76% predicted FVC 77% predicted FEV1 to FVC ratio 100% predicted FEF 25 7569% predicted CXR 01/13/14: NAD Alpha one antitrypsin 156  IgE 56 sl elevated.  RAST neg     Atherosclerotic heart disease of native coronary artery without angina pectoris    CAD (coronary artery disease) 09/23/2018   Chronic fatigue syndrome 12/22/2020   Chronic maxillary sinusitis    Chronic tension-type headache, intractable 08/07/2019   Cigarette nicotine  dependence with nicotine -induced disorder 08/18/2019   Continuous dependence on cigarette smoking 06/09/2019   COPD (chronic obstructive pulmonary disease) (HCC)    COPD mixed type (HCC) 11/09/2020   COPD with acute exacerbation (HCC) 11/10/2020   Coronary artery disease involving native coronary artery of native heart without angina pectoris 07/25/2018   Cough 10/29/2019   Depression  Dyslipidemia associated with type 2 diabetes mellitus (HCC) 08/23/2018   Dysrhythmia    Essential hypertension 07/18/2018   Family history of colonic polyps    Fibromyalgia    GERD (gastroesophageal reflux disease)    Headache    History of colon polyps    Hyperlipidemia    Hypertensive heart disease without heart failure    Hypothyroidism (acquired)    Loculated pleural effusion 12/30/2019   Major depressive disorder, single episode, mild    Menopausal and female climacteric states    Mixed hyperlipidemia    Nicotine  dependence,  cigarettes, with unspecified nicotine -induced disorders    NSTEMI (non-ST elevated myocardial infarction) (HCC) 07/12/2018   Numbness and tingling of left upper extremity    IMO SNOMED Dx Update Oct 2024     Obesity 12/22/2020   OSA (obstructive sleep apnea) 01/13/2014   Sleep study 02/19/14 : AHI 5.5.  Mild sleep apnea.  Lowest SpO2 81%    Osteoarthritis of right knee 03/01/2022   Overweight    Pain in right knee 07/04/2017   Palpitations 08/15/2019   Pleural effusion, not elsewhere classified 12/30/2019   Pneumonia due to COVID-19 virus 01/06/2020   Preoperative cardiovascular examination 08/30/2021   Primary osteoarthritis    RA (rheumatoid arthritis) (HCC)    Ludie Piety   Shortness of breath 10/29/2019   Skin cancer    face   Sleep apnea    cpap   TIA (transient ischemic attack) 09/14/2016   Type 2 diabetes mellitus (HCC)    Type 2 diabetes mellitus with diabetic nephropathy (HCC)    Vitamin D deficiency, unspecified     Past Surgical History:  Procedure Laterality Date   ABDOMINAL HYSTERECTOMY  06/05/1982   BREAST LUMPECTOMY  06/05/1974   left, benign   CARPAL TUNNEL RELEASE Right 1998   CHOLECYSTECTOMY  09/05/2010   COLONOSCOPY  02/07/2013   Colon polyp-status post polypectomy. Small internal hemorrhoids. Otherwise grossly normal colonoscopy   CORONARY PRESSURE/FFR STUDY N/A 01/21/2019   Procedure: INTRAVASCULAR PRESSURE WIRE/FFR STUDY;  Surgeon: Dann Candyce RAMAN, MD;  Location: [facility] INVASIVE CV LAB;  Service: Cardiovascular;  Laterality: N/A;   CORONARY PRESSURE/FFR STUDY N/A 09/16/2021   Procedure: INTRAVASCULAR PRESSURE WIRE/FFR STUDY;  Surgeon: Claudene Victory ORN, MD;  Location: [facility] INVASIVE CV LAB;  Service: Cardiovascular;  Laterality: N/A;   CORONARY STENT INTERVENTION N/A 07/12/2018   Procedure: CORONARY STENT INTERVENTION;  Surgeon: Dann Candyce RAMAN, MD;  Location: [facility] INVASIVE CV LAB;  Service: Cardiovascular;  Laterality: N/A;   CORONARY THROMBECTOMY N/A  07/12/2018   Procedure: Coronary Thrombectomy;  Surgeon: Dann Candyce RAMAN, MD;  Location: [facility] INVASIVE CV LAB;  Service: Cardiovascular;  Laterality: N/A;   CYST EXCISION Left 1980   foot   ESOPHAGOGASTRODUODENOSCOPY  03/26/2017   Moderate gastritis. Otherwise normal EGD   HEEL SPUR SURGERY     foot for spurs   KNEE ARTHROPLASTY Right 03/01/2022   Procedure: COMPUTER ASSISTED TOTAL KNEE ARTHROPLASTY;  Surgeon: Fidel Rogue, MD;  Location: [facility] ORS;  Service: Orthopedics;  Laterality: Right;  150   LEFT HEART CATH AND CORONARY ANGIOGRAPHY N/A 07/12/2018   Procedure: LEFT HEART CATH AND CORONARY ANGIOGRAPHY;  Surgeon: Dann Candyce RAMAN, MD;  Location: [facility] INVASIVE CV LAB;  Service: Cardiovascular;  Laterality: N/A;   LEFT HEART CATH AND CORONARY ANGIOGRAPHY N/A 01/21/2019   Procedure: LEFT HEART CATH AND CORONARY ANGIOGRAPHY;  Surgeon: Dann Candyce RAMAN, MD;  Location: [facility] INVASIVE CV LAB;  Service: Cardiovascular;  Laterality: N/A;   LEFT HEART  CATH AND CORONARY ANGIOGRAPHY N/A 09/16/2021   Procedure: LEFT HEART CATH AND CORONARY ANGIOGRAPHY;  Surgeon: Claudene Victory ORN, MD;  Location: [facility] INVASIVE CV LAB;  Service: Cardiovascular;  Laterality: N/A;   LEFT HEART CATH AND CORONARY ANGIOGRAPHY N/A 08/14/2023   Procedure: LEFT HEART CATH AND CORONARY ANGIOGRAPHY;  Surgeon: Jordan, Peter M, MD;  Location: [facility] INVASIVE CV LAB;  Service: Cardiovascular;  Laterality: N/A;   TONSILLECTOMY     adenoids    Current Medications: Current Meds  Medication Sig   albuterol  (VENTOLIN  HFA) 108 (90 Base) MCG/ACT inhaler Inhale 1-2 puffs into the lungs every 6 (six) hours as needed for wheezing or shortness of breath.   aspirin  EC 81 MG tablet Take 81 mg by mouth once.     Allergies:   Shellfish allergy , Contrast media [iodinated contrast media], Metformin and related, Duloxetine, Oxycodone, Amitriptyline hcl, Cymbalta [duloxetine hcl], Iohexol , Oxycontin [oxycodone hcl], and Ultram [tramadol]   Social  History   Socioeconomic History   Marital status: Married    Spouse name: Not on file   Number of children: 6   Years of education: Not on file   Highest education level: Not on file  Occupational History   Occupation: disabled    Comment: jockey  Tobacco Use   Smoking status: Former    Current packs/day: 4.00    Average packs/day: 4.0 packs/day for 30.0 years (120.0 ttl pk-yrs)    Types: Cigarettes   Smokeless tobacco: Never   Tobacco comments:    Quit 2 weeks ago, 11/15/2020  Vaping Use   Vaping status: Never Used  Substance and Sexual Activity   Alcohol  use: No   Drug use: No   Sexual activity: Not on file  Other Topics Concern   Not on file  Social History Narrative   Lives with Meliza Kage husband.   Disabled   Social Drivers of Corporate Investment Banker Strain: Not on file  Food Insecurity: No Food Insecurity (03/01/2022)   Hunger Vital Sign    Worried About Running Out of Food in the Last Year: Never true    Ran Out of Food in the Last Year: Never true  Transportation Needs: No Transportation Needs (03/01/2022)   PRAPARE - Administrator, Civil Service (Medical): No    Lack of Transportation (Non-Medical): No  Physical Activity: Not on file  Stress: Not on file  Social Connections: Not on file     Family History: The patient's family history includes Bone cancer in her father; Breast cancer in her sister; CAD in an other family member; COPD in her brother; Diabetes in an other family member; Hypertension in an other family member; Lung cancer in her father; Lung disease in her maternal uncle, maternal uncle, and mother; Lymphoma in her sister; Multiple sclerosis in her brother; Muscular dystrophy in her brother; Seizures in an other family member. There is no history of Colon cancer, Colon polyps, Esophageal cancer, Stomach cancer, or Rectal cancer.  ROS:   Please see the history of present illness.    All other systems reviewed and are  negative.  EKGs/Labs/Other Studies Reviewed:    The following studies were reviewed today: I reviewed reports from the ER.   Recent Labs: 04/23/2024: B Natriuretic Peptide 15.2; BUN 15; Creatinine, Ser 0.80; Hemoglobin 14.7; Platelets 310; Potassium 3.6; Sodium 139  Recent Lipid Panel    Component Value Date/Time   CHOL 214 (H) 06/18/2020 0925   TRIG 268 (H) 06/18/2020 9074  HDL 42 06/18/2020 0925   CHOLHDL 5.1 (H) 06/18/2020 0925   CHOLHDL 5.7 07/12/2018 0333   VLDL 29 07/12/2018 0333   LDLCALC 125 (H) 06/18/2020 0925    Physical Exam:    VS:  BP 132/66 (BP Location: Right Arm, Patient Position: Sitting)   Pulse 96   Wt 172 lb (78 kg)   SpO2 97%   BMI 28.62 kg/m     Wt Readings from Last 3 Encounters:  04/24/24 172 lb (78 kg)  04/23/24 150 lb (68 kg)  08/14/23 150 lb (68 kg)     GEN: Patient is in no acute distress HEENT: Normal NECK: No JVD; No carotid bruits LYMPHATICS: No lymphadenopathy CARDIAC: Hear sounds regular, 2/6 systolic murmur at the apex. RESPIRATORY:  Clear to auscultation without rales, wheezing or rhonchi  ABDOMEN: Soft, non-tender, non-distended MUSCULOSKELETAL:  No edema; No deformity  SKIN: Warm and dry NEUROLOGIC:  Alert and oriented x 3 PSYCHIATRIC:  Normal affect   Signed, Jennifer JONELLE Crape, MD  04/24/2024 4:30 PM    [facility]

## 2024-04-24 NOTE — ED Provider Notes (Signed)
 Brenda Cochran EMERGENCY DEPARTMENT AT [facility] Provider Note   CSN: [id number] Arrival date & time: 04/24/24  1729     Patient presents with: D-Dimer >20    Brenda Cochran is a 73 y.o. female history of nonobstructive CAD, here presenting with shortness of breath and left leg swelling.  Patient states that a month ago she went to [place].  She states that she has intermittent swelling and pain to the left calf for the last month.  She states that 2 weeks ago she drove all the way to [place]  and came back.  She states that since then she has worsening calf pain and muscle spasms and also shortness of breath.  Patient states that she has been having some shortness of breath with exertion.  Patient went to [facility] yesterday and had 2 negative troponins and the elevated D-dimer.  Patient unfortunately left without being seen due to the long wait.  Primary care doctor request that patient get a CT scan to rule out PE due to elevated D-dimer.   HPI     Prior to Admission medications   Medication Sig Start Date End Date Taking? Authorizing Provider  albuterol  (VENTOLIN  HFA) 108 (90 Base) MCG/ACT inhaler Inhale 1-2 puffs into the lungs every 6 (six) hours as needed for wheezing or shortness of breath. Patient not taking: Reported on 04/24/2024    [provider]  aspirin  EC 81 MG tablet Take 81 mg by mouth once. Patient not taking: Reported on 04/24/2024    [provider]  diphenhydrAMINE  (BENADRYL ) 25 mg capsule Take 50 mg by mouth once. Patient not taking: Reported on 04/24/2024    [provider]  metoprolol  tartrate (LOPRESSOR ) 25 MG tablet Take 1 tablet (25 mg total) by mouth daily. Patient not taking: Reported on 04/24/2024 08/08/23   Revankar, Jennifer SAUNDERS, MD  nitroGLYCERIN  (NITROSTAT ) 0.4 MG SL tablet Place 1 tablet (0.4 mg total) under the tongue every 5 (five) minutes as needed for chest pain. Patient not taking: Reported on 04/24/2024 08/08/23 08/07/24   Revankar, Jennifer SAUNDERS, MD    Allergies: Shellfish allergy , Contrast media [iodinated contrast media], Metformin and related, Duloxetine, Oxycodone, Amitriptyline hcl, Cymbalta [duloxetine hcl], Iohexol , Oxycontin [oxycodone hcl], and Ultram [tramadol]    Review of Systems  Respiratory:  Positive for shortness of breath.   Musculoskeletal:        Left leg pain  All other systems reviewed and are negative.   Updated Vital Signs BP (!) 150/74   Pulse 79   Temp (!) 97.1 F (36.2 C)   Resp 20   Ht 5' 5 (1.651 m)   Wt 78 kg   SpO2 96%   BMI 28.62 kg/m   Physical Exam Vitals and nursing note reviewed.  Constitutional:      Appearance: Normal appearance.  HENT:     Head: Normocephalic.     Nose: Nose normal.     Mouth/Throat:     Mouth: Mucous membranes are moist.  Eyes:     Extraocular Movements: Extraocular movements intact.     Pupils: Pupils are equal, round, and reactive to light.  Cardiovascular:     Rate and Rhythm: Normal rate and regular rhythm.     Pulses: Normal pulses.     Heart sounds: Normal heart sounds.  Pulmonary:     Effort: Pulmonary effort is normal.     Breath sounds: Normal breath sounds.  Abdominal:     General: Abdomen is flat.  Palpations: Abdomen is soft.  Musculoskeletal:     Cervical back: Normal range of motion and neck supple.     Comments: Mild left calf tenderness  Skin:    General: Skin is warm.     Capillary Refill: Capillary refill takes less than 2 seconds.  Neurological:     General: No focal deficit present.     Mental Status: She is alert and oriented to person, place, and time.  Psychiatric:        Mood and Affect: Mood normal.        Behavior: Behavior normal.     (all labs ordered are listed, but only abnormal results are displayed) Labs Reviewed  COMPREHENSIVE METABOLIC PANEL WITH GFR - Abnormal; Notable for the following components:      Result Value   Glucose, Bld 132 (*)    All other components within normal  limits  CBC WITH DIFFERENTIAL/PLATELET  CK  TROPONIN T, HIGH SENSITIVITY    EKG: None  Radiology: DG Chest 2 View Result Date: 04/23/2024 EXAM: 2 VIEW(S) XRAY OF THE CHEST 04/23/2024 07:42:00 PM COMPARISON: X-ray 09/13/2021. CLINICAL HISTORY: cp FINDINGS: LUNGS AND PLEURA: No focal pulmonary opacity. No pleural effusion. No pneumothorax. HEART AND MEDIASTINUM: Atherosclerotic calcifications. BONES AND SOFT TISSUES: Multilevel degenerative changes of thoracic spine. No acute osseous abnormality. IMPRESSION: 1. No acute cardiopulmonary abnormality. Electronically signed by: Morgane Naveau MD 04/23/2024 08:08 PM EST RP Workstation: [id number]     Procedures   Medications Ordered in the ED  diphenhydrAMINE  (BENADRYL ) capsule 50 mg (has no administration in time range)    Or  diphenhydrAMINE  (BENADRYL ) injection 50 mg (has no administration in time range)  ketorolac  (TORADOL ) 30 MG/ML injection 30 mg (has no administration in time range)  methylPREDNISolone  sodium succinate (SOLU-MEDROL ) 125 mg/2 mL injection 125 mg (125 mg Intravenous Given 04/24/24 1756)                                    Medical Decision Making Brenda Cochran is a 73 y.o. female here presenting with shortness of breath and left calf pain.  She had 2 recent travel history.  Concern for DVT and PE.  Will get DVT ultrasound and also CT PE.  Unfortunately patient needs premedication for CT.   11:01 PM Labs unremarkable and troponin negative x 1.  Ultrasound showed extensive clot in the left femoral vein and popliteal and tibial peroneal vein.  Patient does have good pulses in the left DP.  Patient has no May-Thurner syndrome on the CT abdomen pelvis and no PE on the CT PE scan.  Will refer to DVT clinic and also will start on Eliquis .  Problems Addressed: Acute deep vein thrombosis (DVT) of proximal vein of left lower extremity (HCC): acute illness or injury  Amount and/or Complexity of Data Reviewed Labs:  ordered. Radiology: ordered.  Risk Prescription drug management.     Final diagnoses:  None    ED Discharge Orders     None          Patt Alm Macho, MD 04/24/24 2302

## 2024-04-24 NOTE — ED Notes (Signed)
 Pt has returned to room from ct.  Will continue to monitor

## 2024-04-24 NOTE — Patient Instructions (Signed)
Medication Instructions:  Your physician recommends that you continue on your current medications as directed. Please refer to the Current Medication list given to you today.  *If you need a refill on your cardiac medications before your next appointment, please call your pharmacy*   Lab Work: None Ordered If you have labs (blood work) drawn today and your tests are completely normal, you will receive your results only by: MyChart Message (if you have MyChart) OR A paper copy in the mail If you have any lab test that is abnormal or we need to change your treatment, we will call you to review the results.   Testing/Procedures: None Ordered   Follow-Up: At [facility], you and your health needs are our priority.  As part of our continuing mission to provide you with exceptional heart care, we have created designated Provider Care Teams.  These Care Teams include your primary Cardiologist (physician) and Advanced Practice Providers (APPs -  Physician Assistants and Nurse Practitioners) who all work together to provide you with the care you need, when you need it.  We recommend signing up for the patient portal called "MyChart".  Sign up information is provided on this After Visit Summary.  MyChart is used to connect with patients for Virtual Visits (Telemedicine).  Patients are able to view lab/test results, encounter notes, upcoming appointments, etc.  Non-urgent messages can be sent to your provider as well.   To learn more about what you can do with MyChart, go to [email or web address].    Your next appointment:   1 month(s)  The format for your next appointment:   In Person  Provider:   Belva Crome, MD    Other Instructions NA

## 2024-04-24 NOTE — ED Triage Notes (Signed)
 Pt states was seen at [facility] last night but left after 5 hours   Was seen at PCP office and said her D-dimer was elevated  States concern for blood clot in lung per PCP  States left side knee and leg pain and swelling  Also states SOB with exertion and muscle spasms in chest  Also states intermittent pain across mid back and waistline

## 2024-04-24 NOTE — ED Notes (Signed)
 Patient transported to CT

## 2024-05-05 ENCOUNTER — Encounter: Payer: Self-pay | Admitting: Student-PharmD

## 2024-05-05 ENCOUNTER — Ambulatory Visit: Attending: Surgery | Admitting: Student-PharmD

## 2024-05-05 VITALS — BP 128/83 | HR 90

## 2024-05-05 DIAGNOSIS — I82412 Acute embolism and thrombosis of left femoral vein: Secondary | ICD-10-CM | POA: Diagnosis not present

## 2024-05-05 MED ORDER — APIXABAN 5 MG PO TABS: 5.0000 mg | ORAL_TABLET | Freq: Two times a day (BID) | ORAL | 5 refills | Status: AC

## 2024-05-05 NOTE — Progress Notes (Addendum)
 DVT Clinic Note  Name: Brenda Cochran     MRN: [id number]     DOB: November 13, 1950     Sex: female  PCP: [name], NP  Today's Visit: Visit Information: Initial Visit  Referred to DVT Clinic by: Emergency Department - Dr. Patt Referred to CPP by: Dr. Serene Reason for referral:  Chief Complaint  Patient presents with   Med Management - DVT   HISTORY OF PRESENT ILLNESS: Brenda Cochran is a 73 y.o. female with PMH CAD, TIA, HTN, HLD, T2DM, asthma, COPD, OSA, who presents after diagnosis of DVT for medication management. Patient was diagnosed with RLE DVT in the ED on 04/24/24 and discharged on the Eliquis  starter pack. Today, patient is accompanied by her daughter Brenda Cochran and presents in a wheelchair. Reports that within one day of starting Eliquis  and elevating her legs her swelling resolved. She still has some tenderness in her left leg. Denies abnormal bleeding or bruising. Denies missed doses of Eliquis . Takes it at 8am and 8pm. Daughter comes to her house to make sure she takes it. Reports swelling in legs over the last year but then noticed worsening swelling in her left foot after [place] but it improved on its own. In November she drove 13 hours to [place]  and once she arrived in [place]  her leg was 3x its normal size. Once she was home and it was still not improving after a week, she called her doctor and went to the ED. Endorses history of DVT in her right groin and knee during pregnancy in 1983 and was treated with heparin  then Coumadin for a few months. Reports another clot in the right leg in the 1990s that she thinks was called phlebitis and she was treated with medicine again for a few months. Reports history of DVT in her aunt and renal vein thrombosis in daughter. Otherwise unknown family history. Reports that she has occasional chest pain that hurts a couple times in a minute then goes away. Feels fatigued. Has stopped all medications except Eliquis .  Reports that after losing her husband in 2021 followed by multiple family members in a short period of time, she has had little motivation or desire to get out of her chair at home. She has recently moved to a new apartment in hopes the change in scenery will help her. Other than getting up to go to the bathroom or kitchen, there are days that she doesn't move from her recliner. She is aware this contributes to clotting. She would like to find a PCP within [facility].   Positive Thrombotic Risk Factors: Previous VTE, Sedentary journey lasting >8 hours within 4 weeks, Smoking, Obesity, Other (comment), Older Age (patient reported decreased mobility for the last year) Bleeding Risk Factors: Age >65 years, Anticoagulant therapy  Negative Thrombotic Risk Factors: Recent surgery (within 3 months), Recent trauma (within 3 months), Recent admission to hospital with acute illness (within 3 months), Paralysis, paresis, or recent plaster cast immobilization of lower extremity, Central venous catheterization, Bed rest >72 hours within 3 months, Pregnancy, Within 6 weeks postpartum, Recent cesarean section (within 3 months), Estrogen therapy, Testosterone therapy, Erythropoiesis-stimulating agent, Recent COVID diagnosis (within 3 months), Active cancer, Non-malignant, chronic inflammatory condition, Known thrombophilic condition  Rx Insurance Coverage: Medicare Rx Affordability: Eliquis  was $47/month Rx Assistance Provided: None needed at this time [facility] [place]  Past Medical History:  Diagnosis Date   Anemia    years ago  per pt   Angina pectoris 01/10/2019   Anxiety    Asthma    Asthma, chronic obstructive, with acute exacerbation (HCC)    Spiro 01/13/2014  FEV1 76% predicted FVC 77% predicted FEV1 to FVC ratio 100% predicted FEF 25 7569% predicted CXR 01/13/14: NAD Alpha one antitrypsin 156  IgE 56 sl elevated.  RAST neg     Atherosclerotic heart disease of native coronary artery without  angina pectoris    CAD (coronary artery disease) 09/23/2018   Chronic fatigue syndrome 12/22/2020   Chronic maxillary sinusitis    Chronic tension-type headache, intractable 08/07/2019   Cigarette nicotine  dependence with nicotine -induced disorder 08/18/2019   Continuous dependence on cigarette smoking 06/09/2019   COPD (chronic obstructive pulmonary disease) (HCC)    COPD mixed type (HCC) 11/09/2020   COPD with acute exacerbation (HCC) 11/10/2020   Coronary artery disease involving native coronary artery of native heart without angina pectoris 07/25/2018   Cough 10/29/2019   Depression    Dyslipidemia associated with type 2 diabetes mellitus (HCC) 08/23/2018   Dysrhythmia    Essential hypertension 07/18/2018   Family history of colonic polyps    Fibromyalgia    GERD (gastroesophageal reflux disease)    Headache    History of colon polyps    Hyperlipidemia    Hypertensive heart disease without heart failure    Hypothyroidism (acquired)    Loculated pleural effusion 12/30/2019   Major depressive disorder, single episode, mild    Menopausal and female climacteric states    Mixed hyperlipidemia    Nicotine  dependence, cigarettes, with unspecified nicotine -induced disorders    NSTEMI (non-ST elevated myocardial infarction) (HCC) 07/12/2018   Numbness and tingling of left upper extremity    IMO SNOMED Dx Update Oct 2024     Obesity 12/22/2020   OSA (obstructive sleep apnea) 01/13/2014   Sleep study 02/19/14 : AHI 5.5.  Mild sleep apnea.  Lowest SpO2 81%    Osteoarthritis of right knee 03/01/2022   Overweight    Pain in right knee 07/04/2017   Palpitations 08/15/2019   Pleural effusion, not elsewhere classified 12/30/2019   Pneumonia due to COVID-19 virus 01/06/2020   Preoperative cardiovascular examination 08/30/2021   Primary osteoarthritis    RA (rheumatoid arthritis) (HCC)    Ludie Piety   Shortness of breath 10/29/2019   Skin cancer    face   Sleep apnea    cpap    TIA (transient ischemic attack) 09/14/2016   Type 2 diabetes mellitus (HCC)    Type 2 diabetes mellitus with diabetic nephropathy (HCC)    Vitamin D deficiency, unspecified     Past Surgical History:  Procedure Laterality Date   ABDOMINAL HYSTERECTOMY  06/05/1982   BREAST LUMPECTOMY  06/05/1974   left, benign   CARPAL TUNNEL RELEASE Right 1998   CHOLECYSTECTOMY  09/05/2010   COLONOSCOPY  02/07/2013   Colon polyp-status post polypectomy. Small internal hemorrhoids. Otherwise grossly normal colonoscopy   CORONARY PRESSURE/FFR STUDY N/A 01/21/2019   Procedure: INTRAVASCULAR PRESSURE WIRE/FFR STUDY;  Surgeon: Dann Candyce RAMAN, MD;  Location: [facility] INVASIVE CV LAB;  Service: Cardiovascular;  Laterality: N/A;   CORONARY PRESSURE/FFR STUDY N/A 09/16/2021   Procedure: INTRAVASCULAR PRESSURE WIRE/FFR STUDY;  Surgeon: Claudene Victory ORN, MD;  Location: [facility] INVASIVE CV LAB;  Service: Cardiovascular;  Laterality: N/A;   CORONARY STENT INTERVENTION N/A 07/12/2018   Procedure: CORONARY STENT INTERVENTION;  Surgeon: Dann Candyce RAMAN, MD;  Location: [facility] INVASIVE CV LAB;  Service: Cardiovascular;  Laterality: N/A;  CORONARY THROMBECTOMY N/A 07/12/2018   Procedure: Coronary Thrombectomy;  Surgeon: Dann Candyce RAMAN, MD;  Location: [facility] INVASIVE CV LAB;  Service: Cardiovascular;  Laterality: N/A;   CYST EXCISION Left 1980   foot   ESOPHAGOGASTRODUODENOSCOPY  03/26/2017   Moderate gastritis. Otherwise normal EGD   HEEL SPUR SURGERY     foot for spurs   KNEE ARTHROPLASTY Right 03/01/2022   Procedure: COMPUTER ASSISTED TOTAL KNEE ARTHROPLASTY;  Surgeon: Fidel Rogue, MD;  Location: [facility] ORS;  Service: Orthopedics;  Laterality: Right;  150   LEFT HEART CATH AND CORONARY ANGIOGRAPHY N/A 07/12/2018   Procedure: LEFT HEART CATH AND CORONARY ANGIOGRAPHY;  Surgeon: Dann Candyce RAMAN, MD;  Location: [facility] INVASIVE CV LAB;  Service: Cardiovascular;  Laterality: N/A;   LEFT HEART CATH AND CORONARY ANGIOGRAPHY N/A  01/21/2019   Procedure: LEFT HEART CATH AND CORONARY ANGIOGRAPHY;  Surgeon: Dann Candyce RAMAN, MD;  Location: [facility] INVASIVE CV LAB;  Service: Cardiovascular;  Laterality: N/A;   LEFT HEART CATH AND CORONARY ANGIOGRAPHY N/A 09/16/2021   Procedure: LEFT HEART CATH AND CORONARY ANGIOGRAPHY;  Surgeon: Claudene Victory ORN, MD;  Location: [facility] INVASIVE CV LAB;  Service: Cardiovascular;  Laterality: N/A;   LEFT HEART CATH AND CORONARY ANGIOGRAPHY N/A 08/14/2023   Procedure: LEFT HEART CATH AND CORONARY ANGIOGRAPHY;  Surgeon: Brenda Cochran, Peter M, MD;  Location: [facility] INVASIVE CV LAB;  Service: Cardiovascular;  Laterality: N/A;   TONSILLECTOMY     adenoids    Social History   Socioeconomic History   Marital status: Married    Spouse name: Not on file   Number of children: 6   Years of education: Not on file   Highest education level: Not on file  Occupational History   Occupation: disabled    Comment: jockey  Tobacco Use   Smoking status: Former    Current packs/day: 4.00    Average packs/day: 4.0 packs/day for 30.0 years (120.0 ttl pk-yrs)    Types: Cigarettes   Smokeless tobacco: Never   Tobacco comments:    Quit 2 weeks ago, 11/15/2020  Vaping Use   Vaping status: Never Used  Substance and Sexual Activity   Alcohol  use: No   Drug use: No   Sexual activity: Not on file  Other Topics Concern   Not on file  Social History Narrative   Lives with Zaryiah Barz husband.   Disabled   Social Drivers of Corporate Investment Banker Strain: Not on file  Food Insecurity: No Food Insecurity (03/01/2022)   Hunger Vital Sign    Worried About Running Out of Food in the Last Year: Never true    Ran Out of Food in the Last Year: Never true  Transportation Needs: No Transportation Needs (03/01/2022)   PRAPARE - Administrator, Civil Service (Medical): No    Lack of Transportation (Non-Medical): No  Physical Activity: Not on file  Stress: Not on file  Social Connections: Not on file  Intimate  Partner Violence: Not At Risk (03/01/2022)   Humiliation, Afraid, Rape, and Kick questionnaire    Fear of Current or Ex-Partner: No    Emotionally Abused: No    Physically Abused: No    Sexually Abused: No    Family History  Problem Relation Age of Onset   Breast cancer Sister    Lymphoma Sister    Multiple sclerosis Brother    Muscular dystrophy Brother    CAD Other    Diabetes Other    Hypertension Other  Seizures Other    Lung disease Mother        never smoker   Lung disease Maternal Uncle        lung cancer   Lung disease Maternal Uncle    Lung cancer Father    Bone cancer Father    COPD Brother    Colon cancer Neg Hx    Colon polyps Neg Hx    Esophageal cancer Neg Hx    Stomach cancer Neg Hx    Rectal cancer Neg Hx     Allergies as of 05/05/2024 - Review Complete 05/05/2024  Allergen Reaction Noted   Shellfish allergy  Anaphylaxis 01/12/2014   Contrast media [iodinated contrast media] Rash 01/12/2014   Metformin and related Nausea And Vomiting 01/12/2014   Duloxetine  01/30/2022   Oxycodone  01/30/2022   Amitriptyline hcl Other (See Comments) 08/06/2019   Cymbalta [duloxetine hcl] Nausea And Vomiting 01/12/2014   Iohexol  Rash 07/19/2021   Oxycontin [oxycodone hcl] Palpitations 01/13/2014   Ultram [tramadol] Nausea And Vomiting 01/12/2014    Current Outpatient Medications on File Prior to Visit  Medication Sig Dispense Refill   APIXABAN  (ELIQUIS ) VTE STARTER PACK (10MG  AND 5MG ) Take as directed on package: start with two-5mg  tablets twice daily for 7 days. On day 8, switch to one-5mg  tablet twice daily. 74 each 0   albuterol  (VENTOLIN  HFA) 108 (90 Base) MCG/ACT inhaler Inhale 1-2 puffs into the lungs every 6 (six) hours as needed for wheezing or shortness of breath. (Patient not taking: Reported on 04/24/2024)     aspirin  EC 81 MG tablet Take 81 mg by mouth once. (Patient not taking: Reported on 04/24/2024)     diphenhydrAMINE  (BENADRYL ) 25 mg capsule Take 50 mg  by mouth once. (Patient not taking: Reported on 04/24/2024)     metoprolol  tartrate (LOPRESSOR ) 25 MG tablet Take 1 tablet (25 mg total) by mouth daily. (Patient not taking: Reported on 04/24/2024) 90 tablet 1   nitroGLYCERIN  (NITROSTAT ) 0.4 MG SL tablet Place 1 tablet (0.4 mg total) under the tongue every 5 (five) minutes as needed for chest pain. (Patient not taking: Reported on 04/24/2024) 100 tablet 3   No current facility-administered medications on file prior to visit.   REVIEW OF SYSTEMS:  Review of Systems  Cardiovascular:  Negative for chest pain, palpitations and leg swelling.  Neurological:  Negative for dizziness and tingling.   PHYSICAL EXAMINATION:  Vitals:   05/05/24 0856  BP: 128/83  Pulse: 90  SpO2: 95%   Physical Exam Vitals reviewed.  Cardiovascular:     Rate and Rhythm: Normal rate.  Pulmonary:     Effort: Pulmonary effort is normal.  Musculoskeletal:        General: Tenderness present. No swelling.  Skin:    Findings: No bruising or erythema.  Psychiatric:        Mood and Affect: Mood normal.        Behavior: Behavior normal.        Thought Content: Thought content normal.   LABS:  CBC     Component Value Date/Time   WBC 6.4 04/24/2024 1751   RBC 4.47 04/24/2024 1751   HGB 13.7 04/24/2024 1751   HGB 14.3 08/15/2019 0745   HCT 40.8 04/24/2024 1751   HCT 43.0 08/15/2019 0745   PLT 298 04/24/2024 1751   PLT 300 08/15/2019 0745   MCV 91.3 04/24/2024 1751   MCV 94 08/15/2019 0745   MCH 30.6 04/24/2024 1751   MCHC 33.6 04/24/2024 1751  RDW 12.1 04/24/2024 1751   RDW 11.9 08/15/2019 0745   LYMPHSABS 2.3 04/24/2024 1751   LYMPHSABS 1.8 08/15/2019 0745   MONOABS 0.6 04/24/2024 1751   EOSABS 0.2 04/24/2024 1751   EOSABS 0.6 (H) 08/15/2019 0745   BASOSABS 0.0 04/24/2024 1751   BASOSABS 0.1 08/15/2019 0745    Hepatic Function      Component Value Date/Time   PROT 7.3 04/24/2024 1751   PROT 6.6 06/18/2020 0925   ALBUMIN 3.9 04/24/2024 1751    ALBUMIN 3.8 06/18/2020 0925   AST 18 04/24/2024 1751   ALT 7 04/24/2024 1751   ALKPHOS 100 04/24/2024 1751   BILITOT 0.3 04/24/2024 1751   BILITOT 0.4 06/18/2020 0925   BILIDIR 0.10 06/18/2020 0925   IBILI NOT CALCULATED 09/14/2016 1025    Renal Function   Lab Results  Component Value Date   CREATININE 0.85 04/24/2024   CREATININE 0.80 04/23/2024   CREATININE 0.78 03/02/2022    Estimated Creatinine Clearance: 61.8 mL/min (by C-G formula based on SCr of 0.85 mg/dL).   [facility] Vascular Lab Studies:  04/24/24 doppler FINDINGS: Contralateral Common Femoral Vein: Respiratory phasicity is normal and symmetric with the symptomatic side. No evidence of thrombus. Normal compressibility.   Common Femoral Vein: No evidence of thrombus. Normal compressibility, respiratory phasicity and response to augmentation.   Saphenofemoral Junction: No evidence of thrombus. Normal compressibility and flow on color Doppler imaging.   Profunda Femoral Vein: No evidence of thrombus. Normal compressibility and flow on color Doppler imaging.   Femoral Vein: There is evidence of occlusive thrombus with abnormal compressibility, respiratory phasicity and response to augmentation.   Popliteal Vein: There is evidence of occlusive thrombus with abnormal compressibility, respiratory phasicity and response to augmentation.   Calf Veins: There is evidence of occlusive thrombus within the LEFT posterior tibial vein and LEFT peroneal vein with abnormal compressibility and flow on color Doppler imaging.   Superficial Great Saphenous Vein: No evidence of thrombus. Normal compressibility.   Venous Reflux:  None.   Other Findings:  None.   IMPRESSION: Findings consistent with occlusive DVT within the LEFT femoral vein, LEFT popliteal vein, LEFT posterior tibial vein and LEFT peroneal vein.  04/24/24 CTA  IMPRESSION: 1. No evidence for pulmonary embolism. 2. No acute cardiopulmonary process. 3. Aortic  atherosclerosis.   ASSESSMENT: Patient with reported history of two prior clots (RLE DVT in 1983 during pregnancy, phlebitis in right leg in 1990s), now with diagnosis of LLE DVT extending from the femoral vein through the distal calf veins. She has been started on Eliquis  and is tolerating this well. Leg swelling has resolved. Will continue Eliquis  and provide refills today. Counseled extensively on Eliquis  and all questions have been answered. No barriers identified today to medication access or adherence. Instructed her to reach out to her cardiologist about occasional chest pain that resolves on its own. CT was negative for PE in the ED, and she has known CAD and has stopped all medications except reports adherent to Eliquis , confirmed by daughter. In light of her prior history of DVT and family history of DVT, will refer her to see a hematologist to further investigate causes and determine duration of treatment, and patient is agreeable to this. She did have a recent long drive to [place]  that worsened her symptoms of swelling immediately prior to diagnosis but reports swelling was present before that, so unclear if this was the only provoking cause. She reports being very sedentary this year at home. Patient reported desire to  have all care within [facility] now that she has moved to [place]. Provided with resources to look into PCPs accepting new patients. Encouraged her to discuss symptoms of depression with PCP.   PLAN: -Continue apixaban  (Eliquis ) 5 mg twice daily. -Expected duration of therapy: per hematology. Therapy started on 04/24/24. -Patient educated on purpose, proper use and potential adverse effects of apixaban  (Eliquis ). -Discussed importance of taking medication around the same time every day. -Advised patient of medications to avoid (NSAIDs, aspirin  doses >100 mg daily). -Educated that Tylenol  (acetaminophen ) is the preferred analgesic to lower the risk of bleeding. -Advised  patient to alert all providers of anticoagulation therapy prior to starting a new medication or having a procedure. -Emphasized importance of monitoring for signs and symptoms of bleeding (abnormal bruising, prolonged bleeding, nose bleeds, bleeding from gums, discolored urine, black tarry stools). -Educated patient to present to the ED if emergent signs and symptoms of new thrombosis occur.  Follow up: Referred to hematology. DVT Clinic as needed.   Lum Herald, PharmD, [place], CPP [facility] [phone]  I have evaluated the patient's chart/imaging and refer this patient to the Clinical Pharmacist Practitioner for medication management. I have reviewed the CPP's documentation and agree with her assessment and plan. I was immediately available during the visit for questions and collaboration.   Malvina New, MD

## 2024-05-05 NOTE — Patient Instructions (Signed)
-  Continue apixaban  (Eliquis ) 5 mg twice daily. -[facility] You may need to call the pharmacy to ask them to fill this when you start to run low on your current supply.  -We are referring you to see a hematologist.  -Schedule with a primary care provider: [email or web address] -It is important to take your medication around the same time every day.  -Avoid NSAIDs like ibuprofen (Advil, Motrin) and naproxen (Aleve) as well as aspirin  doses over 100 mg daily. -Tylenol  (acetaminophen ) is the preferred over the counter pain medication to lower the risk of bleeding. -Be sure to alert all of your health care providers that you are taking an anticoagulant prior to starting a new medication or having a procedure. -Monitor for signs and symptoms of bleeding (abnormal bruising, prolonged bleeding, nose bleeds, bleeding from gums, discolored urine, black tarry stools). If you have fallen and hit your head OR if your bleeding is severe or not stopping, seek emergency care.  -Go to the emergency room if emergent signs and symptoms of new clot occur (new or worse swelling and pain in an arm or leg, shortness of breath, chest pain, fast or irregular heartbeats, lightheadedness, dizziness, fainting, coughing up blood) or if you experience a significant color change (pale or blue) in the extremity that has the DVT.   If you have any questions or need to reschedule an appointment, please call [phone]. If you are having an emergency, call 911 or present to the nearest emergency room.   What is a DVT?  -Deep vein thrombosis (DVT) is a condition in which a blood clot forms in a vein of the deep venous system which can occur in the lower leg, thigh, pelvis, arm, or neck. This condition is serious and can be life-threatening if the clot travels to the arteries of the lungs and causing a blockage (pulmonary embolism, PE). A DVT can also damage veins in the leg,  which can lead to long-term venous disease, leg pain, swelling, discoloration, and ulcers or sores (post-thrombotic syndrome).  -Treatment may include taking an anticoagulant medication to prevent more clots from forming and the current clot from growing, wearing compression stockings, and/or surgical procedures to remove or dissolve the clot.

## 2024-05-12 ENCOUNTER — Telehealth: Payer: Self-pay | Admitting: Pharmacist

## 2024-05-12 DIAGNOSIS — M7989 Other specified soft tissue disorders: Secondary | ICD-10-CM

## 2024-05-12 NOTE — Telephone Encounter (Signed)
 Patient's daughter called reporting her mother has been experiencing right arm pain and swelling that began about 2 days ago and she was concerned about another blood clot. Patient was diagnosed with a LLE DVT on 04/24/24 and was started on Eliquis . Reports adherence as prescribed and denies missed doses. Patient had a peripheral IV in her right arm during ED visit on 04/24/24. Discussed the patient with Dr. Serene who recommended obtaining right upper extremity venous ultrasound to evaluate further. Called patient's daughter back and she confirmed understanding of plan.

## 2024-05-14 ENCOUNTER — Ambulatory Visit (HOSPITAL_COMMUNITY)
Admission: RE | Admit: 2024-05-14 | Discharge: 2024-05-14 | Disposition: A | Source: Ambulatory Visit | Attending: Surgery | Admitting: Surgery

## 2024-05-14 DIAGNOSIS — M7989 Other specified soft tissue disorders: Secondary | ICD-10-CM | POA: Diagnosis present

## 2024-05-21 ENCOUNTER — Other Ambulatory Visit: Payer: Self-pay

## 2024-05-27 ENCOUNTER — Ambulatory Visit: Admitting: Cardiology

## 2024-06-10 NOTE — Progress Notes (Unsigned)
 " [facility] Telephone:([phone]) [phone]   [phone]  INITIAL CONSULT NOTE  Patient Care Team: [name], NP as PCP - General  Hematological/Oncological History # Left Lower Extremity DVT, Provoked 04/24/2024: US  LE showed findings consistent with occlusive DVT within the LEFT femoral vein, LEFT popliteal vein, LEFT posterior tibial vein and LEFT peroneal vein. 06/12/2023: establish care with Dr. Federico   CHIEF COMPLAINTS/PURPOSE OF CONSULTATION:  Left Lower Extremity DVT, Provoked   HISTORY OF PRESENTING ILLNESS:  Brenda Cochran 74 y.o. female with medical history significant for asthma, coronary artery disease, COPD, cough, depression, RA, type 2 diabetes, and OSA who presents for evaluation of a left lower extremity DVT.  On review of the previous records Brenda Cochran underwent ultrasound on 04/24/2024 which showed findings consistent with occlusive DVT within the left femoral vein, left popliteal vein, left posterior tibial vein, and left peroneal vein.  She was started on Eliquis  therapy.  Due to concern for her history of VTE she was referred to hematology for further evaluation and management.  On exam today Brenda Cochran reports that she has had issues with colitis in her lower extremity on and off for several years.  She reports that she has had clots before in her right leg and reports she also had a blood clot go to her lung in 1983.  She reports she was on Coumadin therapy at that time.  She reports that she realized something was wrong when her left leg began swelling recently and the swelling did not go down.  Prior to the clot she had made trips to and from [place]  to 13-hour drive there and back within a 3-day span.  She reports that she did take some breaks but not often.  She reports that when became larger and more uncomfortable she sought medical attention and the blood clot was found.  She reports that she is still having some discomfort at this time in her  left lower extremity but she is tolerating Eliquis  well with no bleeding, bruising, or dark stools.  She has not had any fevers, chills, sweats, nausea, or diarrhea.  On further discussion she reports that her mother had lung disease and her dad had bone cancer.  She had a sister who unfortunately died of cancer as well.  Her daughter has also recently had a blood clot.  She notes that she is not currently up-to-date on her colonoscopy and mammogram but will be asking her primary doctor about these.  She reports she is an active smoker smoking 2 packs/day.  She is down from 4 packs/day.  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  Otherwise she has been her baseline level of health and has no additional questions concerns or complaints today.  Full 10 point ROS otherwise negative.  MEDICAL HISTORY:  Past Medical History:  Diagnosis Date   Anemia    years ago per pt   Angina pectoris 01/10/2019   Anxiety    Asthma    Asthma, chronic obstructive, with acute exacerbation (HCC)    Spiro 01/13/2014  FEV1 76% predicted FVC 77% predicted FEV1 to FVC ratio 100% predicted FEF 25 7569% predicted CXR 01/13/14: NAD Alpha one antitrypsin 156  IgE 56 sl elevated.  RAST neg     Atherosclerotic heart disease of native coronary artery without angina pectoris    CAD (coronary artery disease) 09/23/2018   Chronic fatigue  syndrome 12/22/2020   Chronic maxillary sinusitis    Chronic tension-type headache, intractable 08/07/2019   Cigarette nicotine  dependence with nicotine -induced disorder 08/18/2019   Continuous dependence on cigarette smoking 06/09/2019   COPD (chronic obstructive pulmonary disease) (HCC)    COPD mixed type (HCC) 11/09/2020   COPD with acute exacerbation (HCC) 11/10/2020   Coronary artery disease involving native coronary artery of native heart without angina pectoris 07/25/2018   Cough 10/29/2019   Depression    Dyslipidemia  associated with type 2 diabetes mellitus (HCC) 08/23/2018   Dysrhythmia    Essential hypertension 07/18/2018   Family history of colonic polyps    Fibromyalgia    GERD (gastroesophageal reflux disease)    Headache    History of colon polyps    Hyperlipidemia    Hypertensive heart disease without heart failure    Hypothyroidism (acquired)    Loculated pleural effusion 12/30/2019   Major depressive disorder, single episode, mild    Menopausal and female climacteric states    Mixed hyperlipidemia    Nicotine  dependence, cigarettes, with unspecified nicotine -induced disorders    NSTEMI (non-ST elevated myocardial infarction) (HCC) 07/12/2018   Numbness and tingling of left upper extremity    IMO SNOMED Dx Update Oct 2024     Obesity 12/22/2020   OSA (obstructive sleep apnea) 01/13/2014   Sleep study 02/19/14 : AHI 5.5.  Mild sleep apnea.  Lowest SpO2 81%    Osteoarthritis of right knee 03/01/2022   Overweight    Pain in right knee 07/04/2017   Palpitations 08/15/2019   Pleural effusion, not elsewhere classified 12/30/2019   Pneumonia due to COVID-19 virus 01/06/2020   Preoperative cardiovascular examination 08/30/2021   Primary osteoarthritis    RA (rheumatoid arthritis) (HCC)    Brenda Cochran   Shortness of breath 10/29/2019   Skin cancer    face   Sleep apnea    cpap   TIA (transient ischemic attack) 09/14/2016   Type 2 diabetes mellitus (HCC)    Type 2 diabetes mellitus with diabetic nephropathy (HCC)    Vitamin D deficiency, unspecified     SURGICAL HISTORY: Past Surgical History:  Procedure Laterality Date   ABDOMINAL HYSTERECTOMY  06/05/1982   BREAST LUMPECTOMY  06/05/1974   left, benign   CARPAL TUNNEL RELEASE Right 1998   CHOLECYSTECTOMY  09/05/2010   COLONOSCOPY  02/07/2013   Colon polyp-status post polypectomy. Small internal hemorrhoids. Otherwise grossly normal colonoscopy   CORONARY PRESSURE/FFR STUDY N/A 01/21/2019   Procedure: INTRAVASCULAR PRESSURE  WIRE/FFR STUDY;  Surgeon: Dann Candyce RAMAN, MD;  Location: [facility] INVASIVE CV LAB;  Service: Cardiovascular;  Laterality: N/A;   CORONARY PRESSURE/FFR STUDY N/A 09/16/2021   Procedure: INTRAVASCULAR PRESSURE WIRE/FFR STUDY;  Surgeon: Claudene Victory ORN, MD;  Location: [facility] INVASIVE CV LAB;  Service: Cardiovascular;  Laterality: N/A;   CORONARY STENT INTERVENTION N/A 07/12/2018   Procedure: CORONARY STENT INTERVENTION;  Surgeon: Dann Candyce RAMAN, MD;  Location: [facility] INVASIVE CV LAB;  Service: Cardiovascular;  Laterality: N/A;   CORONARY THROMBECTOMY N/A 07/12/2018   Procedure: Coronary Thrombectomy;  Surgeon: Dann Candyce RAMAN, MD;  Location: [facility] INVASIVE CV LAB;  Service: Cardiovascular;  Laterality: N/A;   CYST EXCISION Left 1980   foot   ESOPHAGOGASTRODUODENOSCOPY  03/26/2017   Moderate gastritis. Otherwise normal EGD   HEEL SPUR SURGERY     foot for spurs   KNEE ARTHROPLASTY Right 03/01/2022   Procedure: COMPUTER ASSISTED TOTAL KNEE ARTHROPLASTY;  Surgeon: Fidel Rogue, MD;  Location: [facility] ORS;  Service:  Orthopedics;  Laterality: Right;  150   LEFT HEART CATH AND CORONARY ANGIOGRAPHY N/A 07/12/2018   Procedure: LEFT HEART CATH AND CORONARY ANGIOGRAPHY;  Surgeon: Dann Candyce RAMAN, MD;  Location: [facility] INVASIVE CV LAB;  Service: Cardiovascular;  Laterality: N/A;   LEFT HEART CATH AND CORONARY ANGIOGRAPHY N/A 01/21/2019   Procedure: LEFT HEART CATH AND CORONARY ANGIOGRAPHY;  Surgeon: Dann Candyce RAMAN, MD;  Location: [facility] INVASIVE CV LAB;  Service: Cardiovascular;  Laterality: N/A;   LEFT HEART CATH AND CORONARY ANGIOGRAPHY N/A 09/16/2021   Procedure: LEFT HEART CATH AND CORONARY ANGIOGRAPHY;  Surgeon: Claudene Victory ORN, MD;  Location: [facility] INVASIVE CV LAB;  Service: Cardiovascular;  Laterality: N/A;   LEFT HEART CATH AND CORONARY ANGIOGRAPHY N/A 08/14/2023   Procedure: LEFT HEART CATH AND CORONARY ANGIOGRAPHY;  Surgeon: Jordan, Peter M, MD;  Location: [facility] INVASIVE CV LAB;  Service: Cardiovascular;   Laterality: N/A;   TONSILLECTOMY     adenoids    SOCIAL HISTORY: Social History   Socioeconomic History   Marital status: Married    Spouse name: Not on file   Number of children: 6   Years of education: Not on file   Highest education level: Not on file  Occupational History   Occupation: disabled    Comment: jockey  Tobacco Use   Smoking status: Former    Current packs/day: 4.00    Average packs/day: 4.0 packs/day for 30.0 years (120.0 ttl pk-yrs)    Types: Cigarettes   Smokeless tobacco: Never   Tobacco comments:    Quit 2 weeks ago, 11/15/2020  Vaping Use   Vaping status: Never Used  Substance and Sexual Activity   Alcohol  use: No   Drug use: No   Sexual activity: Not on file  Other Topics Concern   Not on file  Social History Narrative   Lives with Anneta Rounds husband.   Disabled   Social Drivers of Health   Tobacco Use: Medium Risk (05/05/2024)   Patient History    Smoking Tobacco Use: Former    Smokeless Tobacco Use: Never    Passive Exposure: Not on file  Financial Resource Strain: Not on file  Food Insecurity: No Food Insecurity (03/01/2022)   Hunger Vital Sign    Worried About Running Out of Food in the Last Year: Never true    Ran Out of Food in the Last Year: Never true  Transportation Needs: No Transportation Needs (03/01/2022)   PRAPARE - Administrator, Civil Service (Medical): No    Lack of Transportation (Non-Medical): No  Physical Activity: Not on file  Stress: Not on file  Social Connections: Not on file  Intimate Partner Violence: Not At Risk (03/01/2022)   Humiliation, Afraid, Rape, and Kick questionnaire    Fear of Current or Ex-Partner: No    Emotionally Abused: No    Physically Abused: No    Sexually Abused: No  Depression (PHQ2-9): Low Risk (06/11/2024)   Depression (PHQ2-9)    PHQ-2 Score: 0  Alcohol  Screen: Not on file  Housing: Low Risk (03/01/2022)   Housing    Last Housing Risk Score: 0  Utilities: Not At Risk  (03/01/2022)   AHC Utilities    Threatened with loss of utilities: No  Health Literacy: Not on file    FAMILY HISTORY: Family History  Problem Relation Age of Onset   Breast cancer Sister    Lymphoma Sister    Multiple sclerosis Brother    Muscular dystrophy Brother  CAD Other    Diabetes Other    Hypertension Other    Seizures Other    Lung disease Mother        never smoker   Lung disease Maternal Uncle        lung cancer   Lung disease Maternal Uncle    Lung cancer Father    Bone cancer Father    COPD Brother    Colon cancer Neg Hx    Colon polyps Neg Hx    Esophageal cancer Neg Hx    Stomach cancer Neg Hx    Rectal cancer Neg Hx     ALLERGIES:  is allergic to shellfish allergy , contrast media [iodinated contrast media], metformin and related, duloxetine, oxycodone, amitriptyline hcl, cymbalta [duloxetine hcl], iohexol , oxycontin [oxycodone hcl], and ultram [tramadol].  MEDICATIONS:  Current Outpatient Medications  Medication Sig Dispense Refill   albuterol  (VENTOLIN  HFA) 108 (90 Base) MCG/ACT inhaler Inhale 1-2 puffs into the lungs every 6 (six) hours as needed for wheezing or shortness of breath.     nitroGLYCERIN  (NITROSTAT ) 0.4 MG SL tablet Place 1 tablet (0.4 mg total) under the tongue every 5 (five) minutes as needed for chest pain. 100 tablet 3   apixaban  (ELIQUIS ) 5 MG TABS tablet Take 1 tablet (5 mg total) by mouth 2 (two) times daily. Start taking after completion of starter pack. 60 tablet 5   No current facility-administered medications for this visit.    REVIEW OF SYSTEMS:   Constitutional: ( - ) fevers, ( - )  chills , ( - ) night sweats Eyes: ( - ) blurriness of vision, ( - ) double vision, ( - ) watery eyes Ears, nose, mouth, throat, and face: ( - ) mucositis, ( - ) sore throat Respiratory: ( - ) cough, ( - ) dyspnea, ( - ) wheezes Cardiovascular: ( - ) palpitation, ( - ) chest discomfort, ( - ) lower extremity swelling Gastrointestinal:  ( - )  nausea, ( - ) heartburn, ( - ) change in bowel habits Skin: ( - ) abnormal skin rashes Lymphatics: ( - ) new lymphadenopathy, ( - ) easy bruising Neurological: ( - ) numbness, ( - ) tingling, ( - ) new weaknesses Behavioral/Psych: ( - ) mood change, ( - ) new changes  All other systems were reviewed with the patient and are negative.  PHYSICAL EXAMINATION:  Vitals:   06/11/24 0919  BP: 126/82  Pulse: (!) 101  Resp: 18  Temp: (!) 97.5 F (36.4 C)  SpO2: 96%   Filed Weights   06/11/24 0919  Weight: 167 lb 3.2 oz (75.8 kg)    GENERAL: well appearing elderly Caucasian female in NAD  SKIN: skin color, texture, turgor are normal, no rashes or significant lesions EYES: conjunctiva are pink and non-injected, sclera clear LUNGS: clear to auscultation and percussion with normal breathing effort HEART: regular rate & rhythm and no murmurs and no lower extremity edema Musculoskeletal: no cyanosis of digits and no clubbing  PSYCH: alert & oriented x 3, fluent speech NEURO: no focal motor/sensory deficits  LABORATORY DATA:  I have reviewed the data as listed    Latest Ref Rng & Units 06/11/2024   10:03 AM 04/24/2024    5:51 PM 04/23/2024    7:18 PM  CBC  WBC 4.0 - 10.5 K/uL 6.2  6.4  6.2   Hemoglobin 12.0 - 15.0 g/dL 84.2  86.2  85.2   Hematocrit 36.0 - 46.0 % 45.4  40.8  44.6  Platelets 150 - 400 K/uL 291  298  310        Latest Ref Rng & Units 06/11/2024   10:03 AM 04/24/2024    5:51 PM 04/23/2024    7:18 PM  CMP  Glucose 70 - 99 mg/dL 889  867  885   BUN 8 - 23 mg/dL 8  13  15    Creatinine 0.44 - 1.00 mg/dL 9.17  9.14  9.19   Sodium 135 - 145 mmol/L 141  140  139   Potassium 3.5 - 5.1 mmol/L 4.4  3.8  3.6   Chloride 98 - 111 mmol/L 103  102  103   CO2 22 - 32 mmol/L 27  25  21    Calcium  8.9 - 10.3 mg/dL 9.9  9.6  9.2   Total Protein 6.5 - 8.1 g/dL 7.8  7.3    Total Bilirubin 0.0 - 1.2 mg/dL 0.6  0.3    Alkaline Phos 38 - 126 U/L 111  100    AST 15 - 41 U/L 15  18     ALT 0 - 44 U/L 6  7       ASSESSMENT & PLAN Brenda RAMAN Kinkead 74 y.o. female with medical history significant for asthma, coronary artery disease, COPD, cough, depression, RA, type 2 diabetes, and OSA who presents for evaluation of a left lower extremity DVT.  After review of the labs, review of the records, and discussion with the patient the patients findings are most consistent with recurrent provoked VTE's and history of right leg thrombophlebitis.  A provoked venous thromboembolism (VTE) is one that has a clear inciting factor or event. Provoking factors include prolonged travel/immobility, surgery (particularly abdominal or orthopedic), trauma,  and pregnancy/ estrogen containing birth control. This patient was reported to have prolonged travel, which would qualify as a transient provoking factor. As such we would recommend 3-6 months of anticoagulation therapy with consideration of additional therapy if symptoms persist. The anticoagulation therapy of choice in this situation is Eliquis  therapy. The patient has a supply of this medication and can afford it without difficulty. We will plan to see the patient back in 3 months time to reassess and assure they are doing well on treatment.   # Provoked DVT/Pulmonary Embolism  --findings at this time are consistent with a provoked VTE, however she has had prior pulmonary embolism 1983 after the birth of her daughter and is prone to recurrent bouts of right leg thrombophlebitis. --will order baseline CMP and CBC to assure labs are adequate for DOAC therapy  --recommend the patient continue eliquis  5mg  BID PO daily.  Technically she would only have to do 6 months worth of therapy but given her proclivity for clotting I would recommend consideration of maintenance therapy once she is complete with a full treatment course --patient denies any bleeding, bruising, or dark stools on this medication. It is well tolerated. No difficulties accessing/affording  the medication  --With her family history of clotting we will also order hypercoagulation panel today. --RTC in 6 months time with strict return precautions for overt signs of bleeding.    Orders Placed This Encounter  Procedures   CBC with Differential (Cancer Center Only)    Standing Status:   Future    Number of Occurrences:   1    Expiration Date:   06/11/2025   CMP (Cancer Center only)    Standing Status:   Future    Number of Occurrences:   1  Expiration Date:   06/11/2025   D-dimer, quantitative    Standing Status:   Future    Number of Occurrences:   1    Expected Date:   06/11/2024    Expiration Date:   06/11/2025   Antithrombin III    Protein C activity   Protein C, total   Protein S activity   Protein S, total   Beta-2-glycoprotein i abs, IgG/M/A   Homocysteine, serum   Factor 5 leiden   Prothrombin gene mutation   Cardiolipin antibodies, IgG, IgM, IgA   Vitamin B12    Standing Status:   Future    Number of Occurrences:   1    Expiration Date:   06/11/2025    All questions were answered. The patient knows to call the clinic with any problems, questions or concerns.  A total of more than 60 minutes were spent on this encounter with face-to-face time and non-face-to-face time, including preparing to see the patient, ordering tests and/or medications, counseling the patient and coordination of care as outlined above.   Norleen IVAR Kidney, MD Department of Hematology/Oncology [facility] Phone: [phone] Pager: [phone] Email: [email or web address]  06/11/2024 8:03 PM  "

## 2024-06-11 ENCOUNTER — Inpatient Hospital Stay

## 2024-06-11 ENCOUNTER — Inpatient Hospital Stay: Attending: Hematology and Oncology | Admitting: Hematology and Oncology

## 2024-06-11 VITALS — BP 126/82 | HR 101 | Temp 97.5°F | Resp 18 | Ht 65.0 in | Wt 167.2 lb

## 2024-06-11 DIAGNOSIS — I82452 Acute embolism and thrombosis of left peroneal vein: Secondary | ICD-10-CM | POA: Diagnosis not present

## 2024-06-11 LAB — CBC WITH DIFFERENTIAL (CANCER CENTER ONLY)
Abs Immature Granulocytes: 0.01 K/uL (ref 0.00–0.07)
Basophils Absolute: 0 K/uL (ref 0.0–0.1)
Basophils Relative: 0 %
Eosinophils Absolute: 0.2 K/uL (ref 0.0–0.5)
Eosinophils Relative: 3 %
HCT: 45.4 % (ref 36.0–46.0)
Hemoglobin: 15.7 g/dL — ABNORMAL HIGH (ref 12.0–15.0)
Immature Granulocytes: 0 %
Lymphocytes Relative: 28 %
Lymphs Abs: 1.7 K/uL (ref 0.7–4.0)
MCH: 30.8 pg (ref 26.0–34.0)
MCHC: 34.6 g/dL (ref 30.0–36.0)
MCV: 89 fL (ref 80.0–100.0)
Monocytes Absolute: 0.4 K/uL (ref 0.1–1.0)
Monocytes Relative: 6 %
Neutro Abs: 3.9 K/uL (ref 1.7–7.7)
Neutrophils Relative %: 63 %
Platelet Count: 291 K/uL (ref 150–400)
RBC: 5.1 MIL/uL (ref 3.87–5.11)
RDW: 12.1 % (ref 11.5–15.5)
WBC Count: 6.2 K/uL (ref 4.0–10.5)
nRBC: 0 % (ref 0.0–0.2)

## 2024-06-11 LAB — CMP (CANCER CENTER ONLY)
ALT: 6 U/L (ref 0–44)
AST: 15 U/L (ref 15–41)
Albumin: 4 g/dL (ref 3.5–5.0)
Alkaline Phosphatase: 111 U/L (ref 38–126)
Anion gap: 12 (ref 5–15)
BUN: 8 mg/dL (ref 8–23)
CO2: 27 mmol/L (ref 22–32)
Calcium: 9.9 mg/dL (ref 8.9–10.3)
Chloride: 103 mmol/L (ref 98–111)
Creatinine: 0.82 mg/dL (ref 0.44–1.00)
GFR, Estimated: >60 mL/min
Glucose, Bld: 110 mg/dL — ABNORMAL HIGH (ref 70–99)
Potassium: 4.4 mmol/L (ref 3.5–5.1)
Sodium: 141 mmol/L (ref 135–145)
Total Bilirubin: 0.6 mg/dL (ref 0.0–1.2)
Total Protein: 7.8 g/dL (ref 6.5–8.1)

## 2024-06-11 LAB — D-DIMER, QUANTITATIVE: D-Dimer, Quant: >20 ug{FEU}/mL — ABNORMAL HIGH (ref 0.00–0.50)

## 2024-06-11 LAB — ANTITHROMBIN III: AntiThromb III Func: 111 % (ref 75–120)

## 2024-06-11 LAB — VITAMIN B12: Vitamin B-12: 379 pg/mL (ref 180–914)

## 2024-06-12 LAB — BETA-2-GLYCOPROTEIN I ABS, IGG/M/A
Beta-2 Glyco I IgG: <9 GPI IgG units (ref 0–20)
Beta-2-Glycoprotein I IgA: <9 GPI IgA units (ref 0–25)
Beta-2-Glycoprotein I IgM: 35 GPI IgM units — ABNORMAL HIGH (ref 0–32)

## 2024-06-12 LAB — HOMOCYSTEINE: Homocysteine: 16.7 umol/L (ref 0.0–19.2)

## 2024-06-13 LAB — PROTEIN S ACTIVITY: Protein S Activity: 76 % (ref 63–140)

## 2024-06-13 LAB — PROTEIN S, TOTAL: Protein S Ag, Total: 139 % (ref 60–150)

## 2024-06-13 LAB — PROTEIN C, TOTAL: Protein C, Total: 111 % (ref 60–150)

## 2024-06-13 LAB — PROTEIN C ACTIVITY: Protein C Activity: 126 % (ref 73–180)

## 2024-06-14 LAB — CARDIOLIPIN ANTIBODIES, IGG, IGM, IGA
Anticardiolipin IgA: <9 U/mL (ref 0–11)
Anticardiolipin IgG: <9 GPL U/mL (ref 0–14)
Anticardiolipin IgM: 19 [MPL'U]/mL — ABNORMAL HIGH (ref 0–12)

## 2024-06-16 LAB — FACTOR 5 LEIDEN

## 2024-06-17 LAB — PROTHROMBIN GENE MUTATION

## 2024-06-25 ENCOUNTER — Telehealth: Payer: Self-pay

## 2024-06-25 NOTE — Telephone Encounter (Signed)
 Spoke with pt's daughter, [name], to acknowledge her call and let her know we will call with results and recommendations as soon as Dr Federico reviews all labs. [name] voiced understanding.  Dr Federico made aware.

## 2024-06-27 ENCOUNTER — Telehealth: Payer: Self-pay

## 2024-06-27 NOTE — Telephone Encounter (Signed)
 Returned TC to [employer], pt's daughter, and forwarded the following information per Dr Federico Please let Mrs. Umeda know that her D-dimer was elevated >20. There are numerous situations that can elevate a D-dimer including inflammation and infection. I am not as concerned about this elevation  Her genetic testing was negative for a heritable clotting disorder  She did have some mild elevation in her clotting proteins (antiphospholipid antibodies). We'll check those again at her next visit to see if they decrease or remain steady  Overall recommend continuing on her eliquis  as prescribed.  Pt's daughter voiced understanding of information. Asked about next appt.  Request sent to scheduling. Advised pt's daughter that schedulers will phone her for an appointment time.

## 2024-06-27 NOTE — Telephone Encounter (Signed)
 Opened in error

## 2024-07-01 ENCOUNTER — Telehealth: Payer: Self-pay | Admitting: Hematology and Oncology

## 2024-07-01 NOTE — Telephone Encounter (Signed)
 Best contact for scheduling appt, pt has been made aware of new appt date and time

## 2024-09-04 ENCOUNTER — Inpatient Hospital Stay

## 2024-09-04 ENCOUNTER — Inpatient Hospital Stay: Admitting: Hematology and Oncology
